# Patient Record
Sex: Female | Born: 1945
Health system: Southern US, Community
[De-identification: ages and names within clinical notes are randomized; demographics above are authoritative.]

## PROBLEM LIST (undated history)

## (undated) DIAGNOSIS — L900 Lichen sclerosus et atrophicus: Secondary | ICD-10-CM

## (undated) DIAGNOSIS — K219 Gastro-esophageal reflux disease without esophagitis: Secondary | ICD-10-CM

## (undated) DIAGNOSIS — T8859XA Other complications of anesthesia, initial encounter: Secondary | ICD-10-CM

## (undated) DIAGNOSIS — G43909 Migraine, unspecified, not intractable, without status migrainosus: Secondary | ICD-10-CM

## (undated) DIAGNOSIS — M797 Fibromyalgia: Secondary | ICD-10-CM

## (undated) DIAGNOSIS — J449 Chronic obstructive pulmonary disease, unspecified: Secondary | ICD-10-CM

## (undated) DIAGNOSIS — G459 Transient cerebral ischemic attack, unspecified: Secondary | ICD-10-CM

## (undated) DIAGNOSIS — M4800 Spinal stenosis, site unspecified: Secondary | ICD-10-CM

## (undated) DIAGNOSIS — F329 Major depressive disorder, single episode, unspecified: Secondary | ICD-10-CM

## (undated) DIAGNOSIS — L738 Other specified follicular disorders: Secondary | ICD-10-CM

## (undated) DIAGNOSIS — R000 Tachycardia, unspecified: Secondary | ICD-10-CM

## (undated) DIAGNOSIS — E785 Hyperlipidemia, unspecified: Secondary | ICD-10-CM

## (undated) DIAGNOSIS — Z860100 Personal history of colon polyps, unspecified: Secondary | ICD-10-CM

## (undated) DIAGNOSIS — J4489 Other specified chronic obstructive pulmonary disease: Secondary | ICD-10-CM

## (undated) DIAGNOSIS — T7840XA Allergy, unspecified, initial encounter: Secondary | ICD-10-CM

## (undated) DIAGNOSIS — I10 Essential (primary) hypertension: Secondary | ICD-10-CM

## (undated) DIAGNOSIS — F32A Depression, unspecified: Secondary | ICD-10-CM

## (undated) HISTORY — DX: Depression, unspecified: F32.A

## (undated) HISTORY — DX: Gastro-esophageal reflux disease without esophagitis: K21.9

## (undated) HISTORY — DX: Personal history of colonic polyps: Z86.010

## (undated) HISTORY — DX: Major depressive disorder, single episode, unspecified: F32.9

## (undated) HISTORY — PX: ABDOMINAL HYSTERECTOMY: SHX81

## (undated) HISTORY — DX: Essential (primary) hypertension: I10

## (undated) HISTORY — DX: Transient cerebral ischemic attack, unspecified: G45.9

## (undated) HISTORY — PX: BREAST CYST ASPIRATION: SHX578

## (undated) HISTORY — DX: Lichen sclerosus et atrophicus: L90.0

## (undated) HISTORY — PX: KNEE ARTHROSCOPY W/ MENISCAL REPAIR: SHX1877

## (undated) HISTORY — DX: Other specified chronic obstructive pulmonary disease: J44.89

## (undated) HISTORY — PX: OTHER SURGICAL HISTORY: SHX169

## (undated) HISTORY — PX: TONSILLECTOMY: SUR1361

## (undated) HISTORY — PX: LIPOMA EXCISION: SHX5283

## (undated) HISTORY — DX: Allergy, unspecified, initial encounter: T78.40XA

## (undated) HISTORY — PX: CARPAL TUNNEL RELEASE: SHX101

## (undated) HISTORY — DX: Other specified follicular disorders: L73.8

## (undated) HISTORY — PX: BREAST SURGERY: SHX581

## (undated) HISTORY — PX: APPENDECTOMY: SHX54

## (undated) HISTORY — DX: Personal history of colon polyps, unspecified: Z86.0100

## (undated) HISTORY — PX: TRIGGER FINGER RELEASE: SHX641

## (undated) HISTORY — DX: Spinal stenosis, site unspecified: M48.00

## (undated) HISTORY — DX: Migraine, unspecified, not intractable, without status migrainosus: G43.909

## (undated) HISTORY — DX: Tachycardia, unspecified: R00.0

## (undated) HISTORY — DX: Fibromyalgia: M79.7

## (undated) HISTORY — DX: Chronic obstructive pulmonary disease, unspecified: J44.9

## (undated) HISTORY — DX: Hyperlipidemia, unspecified: E78.5

---

## 1980-08-09 HISTORY — PX: BREAST EXCISIONAL BIOPSY: SUR124

## 2001-08-17 ENCOUNTER — Encounter: Payer: Self-pay | Admitting: Internal Medicine

## 2001-08-17 LAB — HM COLONOSCOPY

## 2002-12-27 ENCOUNTER — Encounter: Admission: RE | Admit: 2002-12-27 | Discharge: 2002-12-27 | Payer: Self-pay | Admitting: Pulmonary Disease

## 2002-12-27 ENCOUNTER — Encounter: Payer: Self-pay | Admitting: Pulmonary Disease

## 2003-06-24 ENCOUNTER — Other Ambulatory Visit: Payer: Self-pay

## 2004-06-19 ENCOUNTER — Ambulatory Visit: Payer: Self-pay | Admitting: Internal Medicine

## 2004-06-22 ENCOUNTER — Ambulatory Visit: Payer: Self-pay | Admitting: Gastroenterology

## 2004-07-07 ENCOUNTER — Ambulatory Visit: Payer: Self-pay | Admitting: Internal Medicine

## 2004-07-22 ENCOUNTER — Ambulatory Visit: Payer: Self-pay | Admitting: Gastroenterology

## 2004-10-29 ENCOUNTER — Ambulatory Visit: Payer: Self-pay | Admitting: Internal Medicine

## 2005-03-29 ENCOUNTER — Emergency Department (HOSPITAL_COMMUNITY): Admission: EM | Admit: 2005-03-29 | Discharge: 2005-03-29 | Payer: Self-pay | Admitting: Emergency Medicine

## 2005-04-01 ENCOUNTER — Ambulatory Visit: Payer: Self-pay | Admitting: Internal Medicine

## 2005-04-06 ENCOUNTER — Ambulatory Visit: Payer: Self-pay

## 2005-05-03 ENCOUNTER — Ambulatory Visit: Payer: Self-pay | Admitting: Internal Medicine

## 2005-05-25 ENCOUNTER — Encounter: Admission: RE | Admit: 2005-05-25 | Discharge: 2005-05-25 | Payer: Self-pay | Admitting: Internal Medicine

## 2005-05-31 ENCOUNTER — Ambulatory Visit: Payer: Self-pay | Admitting: Internal Medicine

## 2005-07-14 ENCOUNTER — Ambulatory Visit: Payer: Self-pay | Admitting: Family Medicine

## 2005-07-15 ENCOUNTER — Ambulatory Visit: Payer: Self-pay | Admitting: Family Medicine

## 2005-08-30 ENCOUNTER — Ambulatory Visit: Payer: Self-pay | Admitting: Internal Medicine

## 2005-09-29 ENCOUNTER — Ambulatory Visit: Payer: Self-pay | Admitting: Internal Medicine

## 2005-11-03 ENCOUNTER — Encounter: Payer: Self-pay | Admitting: Internal Medicine

## 2005-12-29 ENCOUNTER — Ambulatory Visit: Payer: Self-pay | Admitting: Internal Medicine

## 2006-03-30 ENCOUNTER — Ambulatory Visit: Payer: Self-pay | Admitting: Gastroenterology

## 2006-03-31 ENCOUNTER — Ambulatory Visit: Payer: Self-pay | Admitting: Cardiology

## 2006-04-04 ENCOUNTER — Encounter: Admission: RE | Admit: 2006-04-04 | Discharge: 2006-04-04 | Payer: Self-pay | Admitting: Family Medicine

## 2006-05-02 ENCOUNTER — Ambulatory Visit: Payer: Self-pay | Admitting: Internal Medicine

## 2006-05-17 ENCOUNTER — Ambulatory Visit: Payer: Self-pay | Admitting: Gastroenterology

## 2006-09-06 ENCOUNTER — Ambulatory Visit: Payer: Self-pay | Admitting: Internal Medicine

## 2006-11-18 ENCOUNTER — Encounter: Payer: Self-pay | Admitting: Internal Medicine

## 2006-11-18 ENCOUNTER — Ambulatory Visit: Payer: Self-pay | Admitting: Internal Medicine

## 2006-11-18 DIAGNOSIS — Z860100 Personal history of colon polyps, unspecified: Secondary | ICD-10-CM | POA: Insufficient documentation

## 2006-11-18 DIAGNOSIS — K219 Gastro-esophageal reflux disease without esophagitis: Secondary | ICD-10-CM | POA: Insufficient documentation

## 2006-11-18 DIAGNOSIS — E1169 Type 2 diabetes mellitus with other specified complication: Secondary | ICD-10-CM | POA: Insufficient documentation

## 2006-11-18 DIAGNOSIS — J449 Chronic obstructive pulmonary disease, unspecified: Secondary | ICD-10-CM | POA: Insufficient documentation

## 2006-11-18 DIAGNOSIS — E7800 Pure hypercholesterolemia, unspecified: Secondary | ICD-10-CM

## 2006-11-18 DIAGNOSIS — J309 Allergic rhinitis, unspecified: Secondary | ICD-10-CM | POA: Insufficient documentation

## 2006-11-18 DIAGNOSIS — H409 Unspecified glaucoma: Secondary | ICD-10-CM | POA: Insufficient documentation

## 2006-11-18 LAB — CONVERTED CEMR LAB
Albumin: 4.4 g/dL (ref 3.5–5.2)
BUN: 18 mg/dL (ref 6–23)
CO2: 24 meq/L (ref 19–32)
Calcium: 9.6 mg/dL (ref 8.4–10.5)
Chloride: 99 meq/L (ref 96–112)
Cholesterol: 261 mg/dL — ABNORMAL HIGH (ref 0–200)
Creatinine, Ser: 0.81 mg/dL (ref 0.40–1.20)
Creatinine, Urine: 55.7 mg/dL
Glucose, Bld: 112 mg/dL — ABNORMAL HIGH (ref 70–99)
HDL: 51 mg/dL (ref >39)
Hgb A1c MFr Bld: 7.4 % — ABNORMAL HIGH (ref 4.6–6.1)
LDL Cholesterol: 137 mg/dL — ABNORMAL HIGH (ref 0–99)
Microalb Creat Ratio: 14 mg/g (ref 0.0–30.0)
Microalb, Ur: 0.78 mg/dL (ref 0.00–1.89)
Phosphorus: 3.7 mg/dL (ref 2.3–4.6)
Potassium: 4.1 meq/L (ref 3.5–5.3)
Sodium: 137 meq/L (ref 135–145)
Total CHOL/HDL Ratio: 5.1
Triglycerides: 363 mg/dL — ABNORMAL HIGH (ref <150)
VLDL: 73 mg/dL — ABNORMAL HIGH (ref 0–40)

## 2006-12-29 ENCOUNTER — Telehealth: Payer: Self-pay | Admitting: Internal Medicine

## 2007-01-30 ENCOUNTER — Encounter: Payer: Self-pay | Admitting: Internal Medicine

## 2007-02-03 ENCOUNTER — Encounter: Payer: Self-pay | Admitting: Internal Medicine

## 2007-04-18 ENCOUNTER — Ambulatory Visit: Payer: Self-pay | Admitting: Family Medicine

## 2007-04-18 DIAGNOSIS — M25559 Pain in unspecified hip: Secondary | ICD-10-CM | POA: Insufficient documentation

## 2007-05-01 DIAGNOSIS — E1129 Type 2 diabetes mellitus with other diabetic kidney complication: Secondary | ICD-10-CM | POA: Insufficient documentation

## 2007-05-01 DIAGNOSIS — Z8673 Personal history of transient ischemic attack (TIA), and cerebral infarction without residual deficits: Secondary | ICD-10-CM | POA: Insufficient documentation

## 2007-05-01 DIAGNOSIS — I10 Essential (primary) hypertension: Secondary | ICD-10-CM | POA: Insufficient documentation

## 2007-05-01 DIAGNOSIS — I152 Hypertension secondary to endocrine disorders: Secondary | ICD-10-CM | POA: Insufficient documentation

## 2007-05-01 DIAGNOSIS — M797 Fibromyalgia: Secondary | ICD-10-CM | POA: Insufficient documentation

## 2007-05-30 ENCOUNTER — Ambulatory Visit: Payer: Self-pay | Admitting: Internal Medicine

## 2007-06-20 ENCOUNTER — Encounter: Admission: RE | Admit: 2007-06-20 | Discharge: 2007-06-20 | Payer: Self-pay | Admitting: Internal Medicine

## 2007-06-22 ENCOUNTER — Encounter (INDEPENDENT_AMBULATORY_CARE_PROVIDER_SITE_OTHER): Payer: Self-pay | Admitting: *Deleted

## 2007-06-22 ENCOUNTER — Encounter: Payer: Self-pay | Admitting: Internal Medicine

## 2007-07-24 ENCOUNTER — Telehealth (INDEPENDENT_AMBULATORY_CARE_PROVIDER_SITE_OTHER): Payer: Self-pay | Admitting: *Deleted

## 2007-08-02 ENCOUNTER — Telehealth (INDEPENDENT_AMBULATORY_CARE_PROVIDER_SITE_OTHER): Payer: Self-pay | Admitting: *Deleted

## 2007-09-26 ENCOUNTER — Telehealth (INDEPENDENT_AMBULATORY_CARE_PROVIDER_SITE_OTHER): Payer: Self-pay | Admitting: *Deleted

## 2007-10-10 ENCOUNTER — Telehealth (INDEPENDENT_AMBULATORY_CARE_PROVIDER_SITE_OTHER): Payer: Self-pay | Admitting: *Deleted

## 2007-10-11 ENCOUNTER — Telehealth (INDEPENDENT_AMBULATORY_CARE_PROVIDER_SITE_OTHER): Payer: Self-pay | Admitting: *Deleted

## 2007-10-30 ENCOUNTER — Telehealth (INDEPENDENT_AMBULATORY_CARE_PROVIDER_SITE_OTHER): Payer: Self-pay | Admitting: *Deleted

## 2007-11-06 ENCOUNTER — Ambulatory Visit: Payer: Self-pay | Admitting: Internal Medicine

## 2007-11-06 LAB — CONVERTED CEMR LAB
Bilirubin Urine: NEGATIVE
Blood in Urine, dipstick: NEGATIVE
Glucose, Urine, Semiquant: NEGATIVE
Ketones, urine, test strip: NEGATIVE
Nitrite: NEGATIVE
Protein, U semiquant: NEGATIVE
Specific Gravity, Urine: 1.01
Urobilinogen, UA: 0.2
WBC Urine, dipstick: NEGATIVE
pH: 5

## 2007-11-27 ENCOUNTER — Ambulatory Visit: Payer: Self-pay | Admitting: Internal Medicine

## 2007-12-05 ENCOUNTER — Telehealth (INDEPENDENT_AMBULATORY_CARE_PROVIDER_SITE_OTHER): Payer: Self-pay | Admitting: *Deleted

## 2007-12-27 ENCOUNTER — Encounter: Payer: Self-pay | Admitting: Internal Medicine

## 2008-01-22 ENCOUNTER — Telehealth (INDEPENDENT_AMBULATORY_CARE_PROVIDER_SITE_OTHER): Payer: Self-pay | Admitting: *Deleted

## 2008-01-23 ENCOUNTER — Telehealth (INDEPENDENT_AMBULATORY_CARE_PROVIDER_SITE_OTHER): Payer: Self-pay | Admitting: *Deleted

## 2008-01-25 ENCOUNTER — Telehealth: Payer: Self-pay | Admitting: Internal Medicine

## 2008-05-15 ENCOUNTER — Telehealth: Payer: Self-pay | Admitting: Internal Medicine

## 2008-05-30 ENCOUNTER — Ambulatory Visit: Payer: Self-pay | Admitting: Internal Medicine

## 2008-05-31 ENCOUNTER — Encounter: Payer: Self-pay | Admitting: Internal Medicine

## 2008-06-02 LAB — CONVERTED CEMR LAB: Anti Nuclear Antibody(ANA): NEGATIVE

## 2008-06-03 LAB — CONVERTED CEMR LAB
ALT: 29 units/L (ref 0–35)
AST: 22 units/L (ref 0–37)
Albumin: 4.2 g/dL (ref 3.5–5.2)
Alkaline Phosphatase: 53 units/L (ref 39–117)
BUN: 17 mg/dL (ref 6–23)
Basophils Absolute: 0 10*3/uL (ref 0.0–0.1)
Basophils Relative: 0.4 % (ref 0.0–3.0)
Bilirubin, Direct: 0.1 mg/dL (ref 0.0–0.3)
CO2: 30 meq/L (ref 19–32)
Calcium: 9.8 mg/dL (ref 8.4–10.5)
Chloride: 99 meq/L (ref 96–112)
Cholesterol: 255 mg/dL (ref 0–200)
Creatinine, Ser: 0.8 mg/dL (ref 0.4–1.2)
Creatinine,U: 98.6 mg/dL
Direct LDL: 143.2 mg/dL
Eosinophils Absolute: 0.6 10*3/uL (ref 0.0–0.7)
Eosinophils Relative: 5.6 % — ABNORMAL HIGH (ref 0.0–5.0)
GFR calc Af Amer: 93 mL/min
GFR calc non Af Amer: 77 mL/min
Glucose, Bld: 117 mg/dL — ABNORMAL HIGH (ref 70–99)
HCT: 39.5 % (ref 36.0–46.0)
HDL: 54 mg/dL (ref >39.0)
Hemoglobin: 13.3 g/dL (ref 12.0–15.0)
Hgb A1c MFr Bld: 6.7 % — ABNORMAL HIGH (ref 4.6–6.0)
Lymphocytes Relative: 31.1 % (ref 12.0–46.0)
MCHC: 33.7 g/dL (ref 30.0–36.0)
MCV: 89.3 fL (ref 78.0–100.0)
Microalb Creat Ratio: 11.2 mg/g (ref 0.0–30.0)
Microalb, Ur: 1.1 mg/dL (ref 0.0–1.9)
Monocytes Absolute: 1 10*3/uL (ref 0.1–1.0)
Monocytes Relative: 9.4 % (ref 3.0–12.0)
Neutro Abs: 5.4 10*3/uL (ref 1.4–7.7)
Neutrophils Relative %: 53.5 % (ref 43.0–77.0)
Phosphorus: 4.4 mg/dL (ref 2.3–4.6)
Platelets: 366 10*3/uL (ref 150–400)
Potassium: 3.8 meq/L (ref 3.5–5.1)
RBC: 4.43 M/uL (ref 3.87–5.11)
RDW: 12.6 % (ref 11.5–14.6)
Sodium: 139 meq/L (ref 135–145)
TSH: 2.58 microintl units/mL (ref 0.35–5.50)
Total Bilirubin: 0.6 mg/dL (ref 0.3–1.2)
Total CHOL/HDL Ratio: 4.7
Total Protein: 7.7 g/dL (ref 6.0–8.3)
Triglycerides: 295 mg/dL (ref 0–149)
VLDL: 59 mg/dL — ABNORMAL HIGH (ref 0–40)
WBC: 10.2 10*3/uL (ref 4.5–10.5)

## 2008-07-02 ENCOUNTER — Telehealth: Payer: Self-pay | Admitting: Internal Medicine

## 2008-07-02 ENCOUNTER — Telehealth (INDEPENDENT_AMBULATORY_CARE_PROVIDER_SITE_OTHER): Payer: Self-pay | Admitting: *Deleted

## 2008-07-05 ENCOUNTER — Encounter: Admission: RE | Admit: 2008-07-05 | Discharge: 2008-07-05 | Payer: Self-pay | Admitting: Internal Medicine

## 2008-07-08 ENCOUNTER — Encounter (INDEPENDENT_AMBULATORY_CARE_PROVIDER_SITE_OTHER): Payer: Self-pay | Admitting: *Deleted

## 2008-07-12 ENCOUNTER — Ambulatory Visit: Payer: Self-pay | Admitting: Family Medicine

## 2008-07-12 LAB — CONVERTED CEMR LAB
Bilirubin Urine: NEGATIVE
Blood in Urine, dipstick: NEGATIVE
Glucose, Urine, Semiquant: NEGATIVE
Ketones, urine, test strip: NEGATIVE
Nitrite: NEGATIVE
Protein, U semiquant: NEGATIVE
Specific Gravity, Urine: <1.005
Urobilinogen, UA: 0.2
WBC Urine, dipstick: NEGATIVE
pH: 7.5

## 2008-08-29 ENCOUNTER — Telehealth: Payer: Self-pay | Admitting: Internal Medicine

## 2008-11-07 ENCOUNTER — Telehealth: Payer: Self-pay | Admitting: Internal Medicine

## 2008-11-25 ENCOUNTER — Ambulatory Visit: Payer: Self-pay | Admitting: Internal Medicine

## 2008-11-28 LAB — CONVERTED CEMR LAB
Albumin: 4 g/dL (ref 3.5–5.2)
BUN: 12 mg/dL (ref 6–23)
Basophils Absolute: 0.1 10*3/uL (ref 0.0–0.1)
Basophils Relative: 0.9 % (ref 0.0–3.0)
CO2: 30 meq/L (ref 19–32)
Calcium: 9.7 mg/dL (ref 8.4–10.5)
Chloride: 102 meq/L (ref 96–112)
Creatinine, Ser: 0.8 mg/dL (ref 0.4–1.2)
Eosinophils Absolute: 0.5 10*3/uL (ref 0.0–0.7)
Eosinophils Relative: 5.1 % — ABNORMAL HIGH (ref 0.0–5.0)
Glucose, Bld: 109 mg/dL — ABNORMAL HIGH (ref 70–99)
HCT: 36.1 % (ref 36.0–46.0)
Hemoglobin: 12.5 g/dL (ref 12.0–15.0)
Hgb A1c MFr Bld: 7.2 % — ABNORMAL HIGH (ref 4.6–6.5)
Lymphocytes Relative: 40.1 % (ref 12.0–46.0)
Lymphs Abs: 4.1 10*3/uL — ABNORMAL HIGH (ref 0.7–4.0)
MCHC: 34.8 g/dL (ref 30.0–36.0)
MCV: 86.3 fL (ref 78.0–100.0)
Monocytes Absolute: 0.8 10*3/uL (ref 0.1–1.0)
Monocytes Relative: 7.7 % (ref 3.0–12.0)
Neutro Abs: 4.8 10*3/uL (ref 1.4–7.7)
Neutrophils Relative %: 46.2 % (ref 43.0–77.0)
Phosphorus: 4.3 mg/dL (ref 2.3–4.6)
Platelets: 358 10*3/uL (ref 150.0–400.0)
Potassium: 4.6 meq/L (ref 3.5–5.1)
RBC: 4.18 M/uL (ref 3.87–5.11)
RDW: 12.5 % (ref 11.5–14.6)
Sodium: 139 meq/L (ref 135–145)
TSH: 3.59 microintl units/mL (ref 0.35–5.50)
WBC: 10.3 10*3/uL (ref 4.5–10.5)

## 2008-12-06 ENCOUNTER — Telehealth: Payer: Self-pay | Admitting: Internal Medicine

## 2009-02-04 ENCOUNTER — Telehealth (INDEPENDENT_AMBULATORY_CARE_PROVIDER_SITE_OTHER): Payer: Self-pay | Admitting: *Deleted

## 2009-02-18 ENCOUNTER — Ambulatory Visit: Payer: Self-pay | Admitting: Family Medicine

## 2009-02-18 DIAGNOSIS — G4700 Insomnia, unspecified: Secondary | ICD-10-CM | POA: Insufficient documentation

## 2009-02-18 DIAGNOSIS — M19049 Primary osteoarthritis, unspecified hand: Secondary | ICD-10-CM | POA: Insufficient documentation

## 2009-02-21 ENCOUNTER — Telehealth: Payer: Self-pay | Admitting: Family Medicine

## 2009-02-26 ENCOUNTER — Telehealth: Payer: Self-pay | Admitting: Family Medicine

## 2009-02-28 ENCOUNTER — Ambulatory Visit: Payer: Self-pay | Admitting: Family Medicine

## 2009-03-07 ENCOUNTER — Ambulatory Visit: Payer: Self-pay | Admitting: Family Medicine

## 2009-03-10 ENCOUNTER — Encounter: Payer: Self-pay | Admitting: Family Medicine

## 2009-03-11 LAB — CONVERTED CEMR LAB
ALT: 36 units/L — ABNORMAL HIGH (ref 0–35)
AST: 30 units/L (ref 0–37)
Albumin: 4.2 g/dL (ref 3.5–5.2)
Alkaline Phosphatase: 54 units/L (ref 39–117)
BUN: 19 mg/dL (ref 6–23)
CO2: 25 meq/L (ref 19–32)
Calcium: 9.4 mg/dL (ref 8.4–10.5)
Chloride: 100 meq/L (ref 96–112)
Cholesterol: 230 mg/dL — ABNORMAL HIGH (ref 0–200)
Creatinine, Ser: 0.9 mg/dL (ref 0.40–1.20)
Glucose, Bld: 117 mg/dL — ABNORMAL HIGH (ref 70–99)
HDL: 50 mg/dL (ref >39)
Hgb A1c MFr Bld: 6.7 % — ABNORMAL HIGH (ref 4.6–6.5)
LDL Cholesterol: 139 mg/dL — ABNORMAL HIGH (ref 0–99)
Potassium: 4.6 meq/L (ref 3.5–5.3)
Sodium: 138 meq/L (ref 135–145)
Total Bilirubin: 0.3 mg/dL (ref 0.3–1.2)
Total CHOL/HDL Ratio: 4.6
Total Protein: 7.2 g/dL (ref 6.0–8.3)
Triglycerides: 203 mg/dL — ABNORMAL HIGH (ref <150)
VLDL: 41 mg/dL — ABNORMAL HIGH (ref 0–40)

## 2009-03-12 ENCOUNTER — Telehealth: Payer: Self-pay | Admitting: Family Medicine

## 2009-05-02 ENCOUNTER — Telehealth (INDEPENDENT_AMBULATORY_CARE_PROVIDER_SITE_OTHER): Payer: Self-pay | Admitting: *Deleted

## 2009-05-28 ENCOUNTER — Ambulatory Visit: Payer: Self-pay | Admitting: Family Medicine

## 2009-05-29 LAB — CONVERTED CEMR LAB
Creatinine, Urine: 75.7 mg/dL
Hgb A1c MFr Bld: 6.6 % — ABNORMAL HIGH (ref 4.6–6.1)
Microalb Creat Ratio: 6.6 mg/g (ref 0.0–30.0)
Microalb, Ur: 0.5 mg/dL (ref 0.00–1.89)

## 2009-06-06 ENCOUNTER — Ambulatory Visit: Payer: Self-pay | Admitting: Family Medicine

## 2009-06-06 DIAGNOSIS — R1012 Left upper quadrant pain: Secondary | ICD-10-CM

## 2009-06-06 DIAGNOSIS — M25519 Pain in unspecified shoulder: Secondary | ICD-10-CM | POA: Insufficient documentation

## 2009-06-06 LAB — CONVERTED CEMR LAB

## 2009-06-09 ENCOUNTER — Encounter: Payer: Self-pay | Admitting: Internal Medicine

## 2009-06-09 ENCOUNTER — Ambulatory Visit: Payer: Self-pay | Admitting: Family Medicine

## 2009-06-09 ENCOUNTER — Telehealth: Payer: Self-pay | Admitting: Family Medicine

## 2009-06-10 LAB — CONVERTED CEMR LAB
ALT: 13 units/L (ref 0–35)
AST: 14 units/L (ref 0–37)
Albumin: 4.4 g/dL (ref 3.5–5.2)
Alkaline Phosphatase: 52 units/L (ref 39–117)
BUN: 24 mg/dL — ABNORMAL HIGH (ref 6–23)
Bilirubin, Direct: <0.1 mg/dL (ref 0.0–0.3)
CO2: 22 meq/L (ref 19–32)
Calcium: 9.9 mg/dL (ref 8.4–10.5)
Chloride: 102 meq/L (ref 96–112)
Cholesterol: 198 mg/dL (ref 0–200)
Creatinine, Ser: 0.97 mg/dL (ref 0.40–1.20)
Glucose, Bld: 150 mg/dL — ABNORMAL HIGH (ref 70–99)
HDL: 48 mg/dL (ref >39)
LDL Cholesterol: 88 mg/dL (ref 0–99)
Potassium: 4.9 meq/L (ref 3.5–5.3)
Sodium: 138 meq/L (ref 135–145)
Total Bilirubin: 0.2 mg/dL — ABNORMAL LOW (ref 0.3–1.2)
Total CHOL/HDL Ratio: 4.1
Total Protein: 7.2 g/dL (ref 6.0–8.3)
Triglycerides: 308 mg/dL — ABNORMAL HIGH (ref <150)
VLDL: 62 mg/dL — ABNORMAL HIGH (ref 0–40)

## 2009-07-07 ENCOUNTER — Encounter: Admission: RE | Admit: 2009-07-07 | Discharge: 2009-07-07 | Payer: Self-pay | Admitting: Family Medicine

## 2009-07-08 ENCOUNTER — Encounter (INDEPENDENT_AMBULATORY_CARE_PROVIDER_SITE_OTHER): Payer: Self-pay | Admitting: *Deleted

## 2009-07-28 ENCOUNTER — Telehealth: Payer: Self-pay | Admitting: Family Medicine

## 2009-08-29 ENCOUNTER — Telehealth: Payer: Self-pay | Admitting: Family Medicine

## 2009-09-30 ENCOUNTER — Telehealth: Payer: Self-pay | Admitting: Family Medicine

## 2009-10-10 ENCOUNTER — Telehealth: Payer: Self-pay | Admitting: Family Medicine

## 2009-12-02 ENCOUNTER — Telehealth: Payer: Self-pay | Admitting: Family Medicine

## 2009-12-24 ENCOUNTER — Ambulatory Visit: Payer: Self-pay | Admitting: Family Medicine

## 2009-12-24 DIAGNOSIS — J019 Acute sinusitis, unspecified: Secondary | ICD-10-CM | POA: Insufficient documentation

## 2009-12-29 ENCOUNTER — Telehealth: Payer: Self-pay | Admitting: Family Medicine

## 2010-01-07 ENCOUNTER — Telehealth: Payer: Self-pay | Admitting: Family Medicine

## 2010-01-21 ENCOUNTER — Ambulatory Visit: Payer: Self-pay | Admitting: Family Medicine

## 2010-01-21 DIAGNOSIS — R002 Palpitations: Secondary | ICD-10-CM

## 2010-01-27 ENCOUNTER — Ambulatory Visit: Payer: Self-pay | Admitting: Cardiovascular Disease

## 2010-01-29 ENCOUNTER — Telehealth (INDEPENDENT_AMBULATORY_CARE_PROVIDER_SITE_OTHER): Payer: Self-pay

## 2010-02-02 ENCOUNTER — Telehealth: Payer: Self-pay | Admitting: Cardiovascular Disease

## 2010-02-03 ENCOUNTER — Encounter: Payer: Self-pay | Admitting: Cardiovascular Disease

## 2010-02-17 ENCOUNTER — Ambulatory Visit: Payer: Self-pay

## 2010-02-17 ENCOUNTER — Encounter: Payer: Self-pay | Admitting: Cardiovascular Disease

## 2010-03-09 LAB — HM PAP SMEAR

## 2010-03-09 LAB — CONVERTED CEMR LAB
Pap Smear: NORMAL
Pap Smear: NORMAL

## 2010-03-09 LAB — HM DIABETES EYE EXAM

## 2010-03-10 ENCOUNTER — Telehealth: Payer: Self-pay | Admitting: Family Medicine

## 2010-03-12 ENCOUNTER — Encounter: Payer: Self-pay | Admitting: Family Medicine

## 2010-04-06 ENCOUNTER — Telehealth: Payer: Self-pay | Admitting: Family Medicine

## 2010-06-17 ENCOUNTER — Ambulatory Visit: Payer: Self-pay | Admitting: Family Medicine

## 2010-07-17 ENCOUNTER — Encounter: Payer: Self-pay | Admitting: Family Medicine

## 2010-07-17 ENCOUNTER — Ambulatory Visit: Payer: Self-pay | Admitting: Family Medicine

## 2010-07-17 DIAGNOSIS — R10814 Left lower quadrant abdominal tenderness: Secondary | ICD-10-CM

## 2010-07-17 LAB — CONVERTED CEMR LAB
Cholesterol, target level: 200 mg/dL
HDL goal, serum: 40 mg/dL
LDL Goal: 70 mg/dL

## 2010-07-19 LAB — CONVERTED CEMR LAB
AST: 19 units/L (ref 0–37)
BUN: 16 mg/dL (ref 6–23)
Calcium: 10.1 mg/dL (ref 8.4–10.5)
Chloride: 98 meq/L (ref 96–112)
Cholesterol: 265 mg/dL — ABNORMAL HIGH (ref 0–200)
Creatinine, Ser: 0.92 mg/dL (ref 0.40–1.20)
Creatinine, Urine: 218.7 mg/dL
HDL: 55 mg/dL (ref >39)
Hgb A1c MFr Bld: 6.4 % — ABNORMAL HIGH (ref <5.7)
Microalb, Ur: 2.08 mg/dL — ABNORMAL HIGH (ref 0.00–1.89)
Total CHOL/HDL Ratio: 4.8
Triglycerides: 432 mg/dL — ABNORMAL HIGH (ref <150)

## 2010-07-23 ENCOUNTER — Encounter
Admission: RE | Admit: 2010-07-23 | Discharge: 2010-07-23 | Payer: Self-pay | Source: Home / Self Care | Attending: Family Medicine | Admitting: Family Medicine

## 2010-08-18 ENCOUNTER — Encounter
Admission: RE | Admit: 2010-08-18 | Discharge: 2010-08-18 | Payer: Self-pay | Source: Home / Self Care | Attending: Family Medicine | Admitting: Family Medicine

## 2010-08-19 LAB — HM MAMMOGRAPHY: HM Mammogram: NORMAL

## 2010-08-30 ENCOUNTER — Encounter: Payer: Self-pay | Admitting: Internal Medicine

## 2010-09-06 LAB — CONVERTED CEMR LAB
ALT: 17 units/L (ref 0–35)
ALT: 24 units/L (ref 0–35)
ALT: 34 units/L (ref 0–35)
AST: 18 units/L (ref 0–37)
AST: 18 units/L (ref 0–37)
Albumin: 4.1 g/dL (ref 3.5–5.2)
Albumin: 4.4 g/dL (ref 3.5–5.2)
Albumin: 4.6 g/dL (ref 3.5–5.2)
Albumin: 4.7 g/dL (ref 3.5–5.2)
Alkaline Phosphatase: 55 units/L (ref 39–117)
Alkaline Phosphatase: 61 units/L (ref 39–117)
BUN: 12 mg/dL (ref 6–23)
BUN: 15 mg/dL (ref 6–23)
BUN: 15 mg/dL (ref 6–23)
Basophils Absolute: 0.1 10*3/uL (ref 0.0–0.1)
Basophils Absolute: 0.1 10*3/uL (ref 0.0–0.1)
Basophils Absolute: 0.1 10*3/uL (ref 0.0–0.1)
Basophils Relative: 1 % (ref 0–1)
Basophils Relative: 1 % (ref 0–1)
Basophils Relative: 1.2 % — ABNORMAL HIGH (ref 0.0–1.0)
Bilirubin, Direct: 0.1 mg/dL (ref 0.0–0.3)
CO2: 23 meq/L (ref 19–32)
CO2: 23 meq/L (ref 19–32)
CO2: 30 meq/L (ref 19–32)
Calcium: 9.9 mg/dL (ref 8.4–10.5)
Calcium: 9.9 mg/dL (ref 8.4–10.5)
Calcium: 9.9 mg/dL (ref 8.4–10.5)
Chloride: 97 meq/L (ref 96–112)
Chloride: 98 meq/L (ref 96–112)
Chloride: 99 meq/L (ref 96–112)
Cholesterol: 258 mg/dL (ref 0–200)
Creatinine, Ser: 0.7 mg/dL (ref 0.4–1.2)
Creatinine, Ser: 0.8 mg/dL (ref 0.40–1.20)
Creatinine, Ser: 0.81 mg/dL (ref 0.40–1.20)
Creatinine,U: 38.8 mg/dL
Direct LDL: 158.3 mg/dL
Eosinophils Absolute: 0.5 10*3/uL (ref 0.0–0.7)
Eosinophils Absolute: 0.7 10*3/uL (ref 0.0–0.7)
Eosinophils Absolute: 0.8 10*3/uL — ABNORMAL HIGH (ref 0.0–0.6)
Eosinophils Relative: 5 % (ref 0–5)
Eosinophils Relative: 7 % — ABNORMAL HIGH (ref 0–5)
Eosinophils Relative: 7.9 % — ABNORMAL HIGH (ref 0.0–5.0)
GFR calc Af Amer: 109 mL/min
GFR calc non Af Amer: 90 mL/min
Glucose, Bld: 108 mg/dL — ABNORMAL HIGH (ref 70–99)
Glucose, Bld: 115 mg/dL — ABNORMAL HIGH (ref 70–99)
Glucose, Bld: 94 mg/dL (ref 70–99)
HCT: 35.9 % — ABNORMAL LOW (ref 36.0–46.0)
HCT: 36.8 % (ref 36.0–46.0)
HCT: 38.1 % (ref 36.0–46.0)
HDL: 55.8 mg/dL (ref >39.0)
Hemoglobin: 12.5 g/dL (ref 12.0–15.0)
Hemoglobin: 12.7 g/dL (ref 12.0–15.0)
Hemoglobin: 12.9 g/dL (ref 12.0–15.0)
Hgb A1c MFr Bld: 7.2 % — ABNORMAL HIGH (ref 4.6–6.0)
Hgb A1c MFr Bld: 7.3 % — ABNORMAL HIGH (ref 4.6–6.1)
Indirect Bilirubin: 0.2 mg/dL (ref 0.0–0.9)
Lymphocytes Relative: 34.9 % (ref 12.0–46.0)
Lymphocytes Relative: 37 % (ref 12–46)
Lymphocytes Relative: 37 % (ref 12–46)
Lymphs Abs: 3.6 10*3/uL (ref 0.7–4.0)
Lymphs Abs: 4 10*3/uL (ref 0.7–4.0)
MCHC: 33.9 g/dL (ref 30.0–36.0)
MCHC: 34.6 g/dL (ref 30.0–36.0)
MCHC: 34.8 g/dL (ref 30.0–36.0)
MCV: 83.5 fL (ref 78.0–100.0)
MCV: 85.8 fL (ref 78.0–100.0)
MCV: 86.6 fL (ref 78.0–100.0)
Microalb Creat Ratio: 18 mg/g (ref 0.0–30.0)
Microalb, Ur: 0.7 mg/dL (ref 0.0–1.9)
Monocytes Absolute: 0.6 10*3/uL (ref 0.1–1.0)
Monocytes Absolute: 0.6 10*3/uL (ref 0.2–0.7)
Monocytes Absolute: 0.8 10*3/uL (ref 0.1–1.0)
Monocytes Relative: 5 % (ref 3–12)
Monocytes Relative: 6.2 % (ref 3.0–11.0)
Monocytes Relative: 8 % (ref 3–12)
Neutro Abs: 4.8 10*3/uL (ref 1.4–7.7)
Neutro Abs: 4.9 10*3/uL (ref 1.7–7.7)
Neutro Abs: 5.6 10*3/uL (ref 1.7–7.7)
Neutrophils Relative %: 49.8 % (ref 43.0–77.0)
Neutrophils Relative %: 50 % (ref 43–77)
Neutrophils Relative %: 51 % (ref 43–77)
Phosphorus: 3.4 mg/dL (ref 2.3–4.6)
Phosphorus: 4 mg/dL (ref 2.3–4.6)
Platelets: 405 10*3/uL — ABNORMAL HIGH (ref 150–400)
Platelets: 414 10*3/uL — ABNORMAL HIGH (ref 150–400)
Platelets: 416 10*3/uL — ABNORMAL HIGH (ref 150–400)
Potassium: 3.6 meq/L (ref 3.5–5.3)
Potassium: 4.1 meq/L (ref 3.5–5.1)
Potassium: 4.7 meq/L (ref 3.5–5.3)
RBC: 4.25 M/uL (ref 3.87–5.11)
RBC: 4.3 M/uL (ref 3.87–5.11)
RBC: 4.44 M/uL (ref 3.87–5.11)
RDW: 12.9 % (ref 11.5–14.6)
RDW: 13.4 % (ref 11.5–15.5)
RDW: 13.7 % (ref 11.5–15.5)
Sodium: 135 meq/L (ref 135–145)
Sodium: 137 meq/L (ref 135–145)
Sodium: 138 meq/L (ref 135–145)
TSH: 2.281 microintl units/mL (ref 0.350–5.50)
TSH: 2.43 microintl units/mL (ref 0.350–4.500)
TSH: 2.88 microintl units/mL (ref 0.35–5.50)
Total Bilirubin: 0.3 mg/dL (ref 0.3–1.2)
Total Bilirubin: 0.3 mg/dL (ref 0.3–1.2)
Total CHOL/HDL Ratio: 4.6
Total Protein: 7.4 g/dL (ref 6.0–8.3)
Total Protein: 7.9 g/dL (ref 6.0–8.3)
Triglycerides: 344 mg/dL (ref 0–149)
VLDL: 69 mg/dL — ABNORMAL HIGH (ref 0–40)
WBC: 10.9 10*3/uL — ABNORMAL HIGH (ref 4.0–10.5)
WBC: 9.7 10*3/uL (ref 4.5–10.5)
WBC: 9.9 10*3/uL (ref 4.0–10.5)

## 2010-09-08 NOTE — Progress Notes (Signed)
Summary: Rx Tramadol  Phone Note Refill Request Call back at [phone] Message from:  Scriptline on September 30, 2009 3:14 PM  Refills Requested: Medication #1:  TRAMADOL HCL 50 MG  TABS 1 three times a day as needed for severe pain   Last Refilled: 07/28/2009 Received refill request from scriptline, please advise   Method Requested: Telephone to Pharmacy Initial call taken by: Linde Gillis CMA Duncan Dull),  September 30, 2009 3:14 PM  Follow-up for Phone Call        Rx called to pharmacy Follow-up by: Linde Gillis CMA Duncan Dull),  October 01, 2009 5:28 PM    Prescriptions: TRAMADOL HCL 50 MG  TABS (TRAMADOL HCL) 1 three times a day as needed for severe pain  #90 Each x 0   Entered and Authorized by:   Kerby Nora MD   Signed by:   Kerby Nora MD on 10/01/2009   Method used:   Telephoned to ...       [facility] (retail)       [address]       [place], [place]  [address]       Ph: [phone]       Fax: [phone]   RxID:   [id number]

## 2010-09-08 NOTE — Progress Notes (Signed)
Summary: albuterol   Phone Note Refill Request Call back at Home Phone [phone] Message from:  Patient on April 06, 2010 11:41 AM  Refills Requested: Medication #1:  ALBUTEROL 90 MCG/ACT  AERS 2 puffs two times a day as needed  Medication #2:  TRAMADOL HCL 50 MG  TABS 1 three times a day as needed for severe pain Patient states that her inhaler has expired and that she has needed alot more often lately. She would like new rx [facility] to [facility] [address]   Initial call taken by: Melody Comas,  April 06, 2010 11:42 AM  Follow-up for Phone Call        If breasthing worsened..she needs an appt to be seen. Refill until appt as below. Follow-up by: Kerby Nora MD,  April 06, 2010 3:54 PM  Additional Follow-up for Phone Call Additional follow up Details #1::        Patient advised.Consuello Masse CMA   Additional Follow-up by: Benny Lennert CMA Duncan Dull),  April 06, 2010 4:01 PM    Prescriptions: ALBUTEROL 90 MCG/ACT  AERS (ALBUTEROL) 2 puffs two times a day as needed  #1 x 0   Entered by:   Benny Lennert CMA (AAMA)   Authorized by:   Kerby Nora MD   Signed by:   Benny Lennert CMA (AAMA) on 04/06/2010   Method used:   Electronically to        [facility] (retail)       [address]       [place], [place]  [address]       Ph: [phone]       Fax: [phone]   RxID:   [id number] TRAMADOL HCL 50 MG  TABS (TRAMADOL HCL) 1 three times a day as needed for severe pain  #90 Each x 0   Entered by:   Benny Lennert CMA (AAMA)   Authorized by:   Kerby Nora MD   Signed by:   Benny Lennert CMA (AAMA) on 04/06/2010   Method used:   Electronically to        [facility] (retail)       [address]       [place], [place]  [address]       Ph: [phone]       Fax: [phone]   RxID:   [id number] TRAMADOL HCL 50 MG  TABS (TRAMADOL HCL) 1 three times a day as needed for severe pain  #90  Each x 0   Entered and Authorized by:   Kerby Nora MD   Signed by:   Kerby Nora MD on 04/06/2010   Method used:   Telephoned to ...       [facility] (retail)       [address]       [place], [place]  [address]       Ph: [phone]       Fax: [phone]   RxID:   [id number] ALBUTEROL 90 MCG/ACT  AERS (ALBUTEROL) 2 puffs two times a day as needed  #0 x 0   Entered and Authorized by:   Kerby Nora MD   Signed by:   Kerby Nora MD on 04/06/2010   Method used:   Telephoned to .Marland KitchenMarland Kitchen  [facility] (retail)       [address]       [place], [place]  [address]       Ph: [phone]       Fax: [phone]   RxID:   [id number]

## 2010-09-08 NOTE — Assessment & Plan Note (Signed)
Summary: COUGH AND SORE THROAT   Vital Signs:  Patient profile:   65 year old female Height:      62 inches Weight:      184.25 pounds BMI:     33.82 Temp:     98.9 degrees F oral Pulse rate:   68 / minute Pulse rhythm:   regular BP sitting:   110 / 70  (left arm) Cuff size:   large  Vitals Entered By: Linde Gillis CMA Duncan Dull) (Dec 24, 2009 12:13 PM) CC: sore throat, congestion, diarrhea   History of Present Illness: 65 yo with 10 days of URI symptoms.  Started out with cough, runny nose. Now has terrible sinus pressure, feels achy all over over. Does not think she has a fever. Coughing up clear phlegm. No SOB or wheezing.  Current Medications (verified): 1)  Metoprolol Tartrate 50 Mg Tabs (Metoprolol Tartrate) .Marland Kitchen.. 1 Tab Po  Two Times A Day 2)  Omeprazole 20 Mg Tbec (Omeprazole) .... Take 2 By Mouth Daily 3)  Lisinopril-Hydrochlorothiazide 10-12.5 Mg Tabs (Lisinopril-Hydrochlorothiazide) .Marland Kitchen.. 1 Tab By Mouth Daily 4)  Metformin Hcl 500 Mg  Tb24 (Metformin Hcl) .... Take 1 Tablet By Mouth Two Times A Day 5)  Xalatan 0.005 %  Soln (Latanoprost) .... As Directed 6)  Timolol Maleate 0.5 %  Solg (Timolol Maleate) .... As Directed 7)  Aspirin 81 Mg  Tbec (Aspirin) .... Take One By Mouth Once A Day 8)  Albuterol 90 Mcg/act  Aers (Albuterol) .... 2 Puffs Two Times A Day As Needed 9)  Tramadol Hcl 50 Mg  Tabs (Tramadol Hcl) .Marland Kitchen.. 1 Three Times A Day As Needed For Severe Pain 10)  Tylenol Pm Extra Strength 500-25 Mg  Tabs (Diphenhydramine-Apap (Sleep)) .... Take 1 Tablet By Mouth At Bedtime 11)  Onetouch Ultra Test  Strp (Glucose Blood) .... Use One Daily As Needed 12)  Clobetasol Propionate 0.05 % Foam (Clobetasol Propionate) .... Use Once or Twice Daily 13)  Desonide 0.05 % Lotn (Desonide) .... Apply To Affected Areas As Needed 14)  [facility]vs Cleansing Skin  Crea (Soap & Cleansers) .... Use As Needed 15)  Kerasal 5-10 % Oint (Salicylic Acid-Urea) .... Use As Needed 16)  Gnp Potassium 99  Mg Tabs (Potassium) .... Take 1 By Mouth Once Daily 17)  Mobic 7.5 Mg Tabs (Meloxicam) .... Take 1 Tablet By Mouth Once A Day 18)  Voltaren 1 % Gel (Diclofenac Sodium) .... Apply 2 Grams Two Times A Day 19)  Azithromycin 250 Mg  Tabs (Azithromycin) .... 2 By  Mouth Today and Then 1 Daily For 4 Days  Allergies: 1)  ! Codeine 2)  ! Sulfa 3)  ! Augmentin 4)  ! Vicodin 5)  ! * Cosopt 6)  ! * Lovastatin 7)  ! * Deet  Review of Systems      See HPI General:  Complains of malaise; denies fever. ENT:  Complains of nasal congestion and sinus pressure; denies earache and sore throat. Resp:  Complains of cough; denies shortness of breath and wheezing.  Physical Exam  General:  overweight appearing female in NAD Nose:  boggy turbinates. frontal sinuses TTP Mouth:  MMM Lungs:  Normal respiratory effort, chest expands symmetrically. Lungs are clear to auscultation, no crackles or wheezes. Heart:  Normal rate and regular rhythm. S1 and S2 normal without gallop, murmur, click, rub or other extra sounds. Extremities:  no edema  Psych:  normally interactive, good eye contact, not anxious appearing, and not depressed appearing.  Impression & Recommendations:  Problem # 1:  SINUSITIS, ACUTE (ICD-461.9) Assessment New Given duration and progression of symptoms, will treat for bacterial sinusitis with Zpack. conitinue supportive care as per pt instructions. Her updated medication list for this problem includes:    Azithromycin 250 Mg Tabs (Azithromycin) .Marland Kitchen... 2 by  mouth today and then 1 daily for 4 days  Complete Medication List: 1)  Metoprolol Tartrate 50 Mg Tabs (Metoprolol tartrate) .Marland Kitchen.. 1 tab po  two times a day 2)  Omeprazole 20 Mg Tbec (Omeprazole) .... Take 2 by mouth daily 3)  Lisinopril-hydrochlorothiazide 10-12.5 Mg Tabs (Lisinopril-hydrochlorothiazide) .Marland Kitchen.. 1 tab by mouth daily 4)  Metformin Hcl 500 Mg Tb24 (Metformin hcl) .... Take 1 tablet by mouth two times a day 5)  Xalatan  0.005 % Soln (Latanoprost) .... As directed 6)  Timolol Maleate 0.5 % Solg (Timolol maleate) .... As directed 7)  Aspirin 81 Mg Tbec (Aspirin) .... Take one by mouth once a day 8)  Albuterol 90 Mcg/act Aers (Albuterol) .... 2 puffs two times a day as needed 9)  Tramadol Hcl 50 Mg Tabs (Tramadol hcl) .Marland Kitchen.. 1 three times a day as needed for severe pain 10)  Tylenol Pm Extra Strength 500-25 Mg Tabs (Diphenhydramine-apap (sleep)) .... Take 1 tablet by mouth at bedtime 11)  Onetouch Ultra Test Strp (Glucose blood) .... Use one daily as needed 12)  Clobetasol Propionate 0.05 % Foam (Clobetasol propionate) .... Use once or twice daily 13)  Desonide 0.05 % Lotn (Desonide) .... Apply to affected areas as needed 14)  [facility]vs Cleansing Skin Crea (Soap & cleansers) .... Use as needed 15)  Kerasal 5-10 % Oint (Salicylic acid-urea) .... Use as needed 16)  Gnp Potassium 99 Mg Tabs (Potassium) .... Take 1 by mouth once daily 17)  Mobic 7.5 Mg Tabs (Meloxicam) .... Take 1 tablet by mouth once a day 18)  Voltaren 1 % Gel (Diclofenac sodium) .... Apply 2 grams two times a day 19)  Azithromycin 250 Mg Tabs (Azithromycin) .... 2 by  mouth today and then 1 daily for 4 days  Patient Instructions: 1)  Take antibiotic as directed.  Drink lots of fluids.  Treat sympotmatically with Mucinex, nasal saline irrigation, and Tylenol/Ibuprofen. Also try claritin D or zyrtec D over the counter- two times a day as needed ( have to sign for them at pharmacy). You can use warm compresses.  Cough suppressant at night. Call if not improving as expected in 5-7 days.  Prescriptions: AZITHROMYCIN 250 MG  TABS (AZITHROMYCIN) 2 by  mouth today and then 1 daily for 4 days  #6 x 0   Entered and Authorized by:   Ruthe Mannan MD   Signed by:   Ruthe Mannan MD on 12/24/2009   Method used:   Electronically to        [facility] (retail)       [address]       [place], [place]  [address]       Ph: [phone]        Fax: [phone]   RxID:   [id number]   Current Allergies (reviewed today): ! CODEINE ! SULFA ! AUGMENTIN ! VICODIN ! * COSOPT ! * LOVASTATIN ! * DEET

## 2010-09-08 NOTE — Progress Notes (Signed)
Summary: tramadol  Phone Note Refill Request Message from:  Scriptline on January 07, 2010 7:28 AM  Refills Requested: Medication #1:  TRAMADOL HCL 50 MG  TABS 1 three times a day as needed for severe pain   Supply Requested: 1 month [facility] [address]   Method Requested: Electronic Initial call taken by: Benny Lennert CMA Duncan Dull),  January 07, 2010 7:28 AM  Follow-up for Phone Call        Rx called to pharmacy Follow-up by: Benny Lennert CMA Duncan Dull),  January 07, 2010 12:26 PM    Prescriptions: TRAMADOL HCL 50 MG  TABS (TRAMADOL HCL) 1 three times a day as needed for severe pain  #90 Each x 0   Entered and Authorized by:   Kerby Nora MD   Signed by:   Kerby Nora MD on 01/07/2010   Method used:   Telephoned to ...       [facility] (retail)       [address]       [place], [place]  [address]       Ph: [phone]       Fax: [phone]   RxID:   [id number]

## 2010-09-08 NOTE — Progress Notes (Signed)
  Phone Note Call from Patient   Caller: Patient Call For: Nurse Summary of Call: Pt called insurance company echo will be covered in full.  Pt questioned how much Event monitor would be per day?  Will call monitor company to see what her cost would be. Initial call taken by: Benedict Needy, RN,  February 02, 2010 8:35 AM  Follow-up for Phone Call        Called event monitor company pt's cost would be $968.  Pt called she doest have land line so will contact another company to ask about there self pay prices. Benedict Needy, RN  February 02, 2010 8:59 AM   AF [employer] monitor $298 for 30 days  Follow-up by: Benedict Needy, RN,  February 03, 2010 8:51 AM  Additional Follow-up for Phone Call Additional follow up Details #1::        pt will call and let us know if she wants the event monitor ordered. Benedict Needy, RN  February 04, 2010 4:28 PM

## 2010-09-08 NOTE — Progress Notes (Signed)
  Phone Note Refill Request Message from:  Scriptline on December 02, 2009 7:59 AM  Refills Requested: Medication #1:  TRAMADOL HCL 50 MG  TABS 1 three times a day as needed for severe pain   Supply Requested: [facility] month [facility]   Method Requested: Electronic Initial call taken by: Benny Lennert CMA Duncan Dull),  December 02, 2009 8:00 AM  Follow-up for Phone Call        rx sent electronically not telephoned in Follow-up by: Benny Lennert CMA Duncan Dull),  December 02, 2009 8:26 AM    Prescriptions: TRAMADOL HCL 50 MG  TABS (TRAMADOL HCL) 1 three times a day as needed for severe pain  #90 Each x 0   Entered by:   Benny Lennert CMA (AAMA)   Authorized by:   Kerby Nora MD   Signed by:   Benny Lennert CMA (AAMA) on 12/02/2009   Method used:   Electronically to        [facility] (retail)       [address]       [place], [place]  [address]       Ph: [phone]       Fax: [phone]   RxID:   [id number] TRAMADOL HCL 50 MG  TABS (TRAMADOL HCL) 1 three times a day as needed for severe pain  #90 Each x 0   Entered and Authorized by:   Kerby Nora MD   Signed by:   Kerby Nora MD on 12/02/2009   Method used:   Telephoned to ...       [facility] ([facility]ail)       [address]       [place], [place]  [address]       Ph: [phone]       Fax: [phone]   RxID:   [id number]

## 2010-09-08 NOTE — Assessment & Plan Note (Signed)
Summary: NP6/AMD   Visit Type:  Initial Consult Primary Provider:  Ermalene Searing  CC:  Has experienced several epiosode of chest pain with rapid heart beats. "Had a funny cold feeling with chest discomfort in arms and shoulders and chest with" flip flopping".  History of Present Illness: Ms. deeney is a very pleasant 65 year old woman with a history of fibromyalgia, remote TIAs in 2007 of uncertain etiology, mild obstructive sleep apnea, mitral valve prolapse, a history of baseline tachycardia who has been maintained on beta blockers, history of night terrors, with 3 episodes of profound cardiac tachycardia since May 1 of this year. She presents for evaluation by referral from Dr. Ermalene Searing.  She had her first episode on May 1, with at least 20 minutes of fast beating, racing, shortness of breath. She felt a cold feeling in her chest to her arms. She had a profound upper respiratory infection for approximately one month and during this time on June 1 had a second episode of tachycardia. She usually tries to wait it out and it seems to resolve on its own. It wakes her up from her sleep. She had a third episode 2 weeks ago which was also profound, causing significant shortness of breath and discomfort.  She denies having rhythms like this before. She denies any significant new stress, no new medications or herbal supplements. She does not think cold medications would've contributed to her symptoms.  She stopped smoking 20 years ago.  EKG shows normal sinus rhythm with rate 65 beats per minute, no significant ST or T wave changes.  Current Medications (verified): 1)  Metoprolol Tartrate 50 Mg Tabs (Metoprolol Tartrate) .Marland Kitchen.. 1 Tab Po  Two Times A Day 2)  Omeprazole 20 Mg Tbec (Omeprazole) .... Take 2 By Mouth Daily 3)  Lisinopril-Hydrochlorothiazide 10-12.5 Mg Tabs (Lisinopril-Hydrochlorothiazide) .Marland Kitchen.. 1 Tab By Mouth Daily 4)  Metformin Hcl 500 Mg  Tb24 (Metformin Hcl) .... Take 1 Tablet By Mouth Two Times  A Day 5)  Xalatan 0.005 %  Soln (Latanoprost) .... As Directed 6)  Timolol Maleate 0.5 %  Solg (Timolol Maleate) .... As Directed 7)  Aspirin 81 Mg  Tbec (Aspirin) .... Take One By Mouth Once A Day 8)  Albuterol 90 Mcg/act  Aers (Albuterol) .... 2 Puffs Two Times A Day As Needed 9)  Tramadol Hcl 50 Mg  Tabs (Tramadol Hcl) .Marland Kitchen.. 1 Three Times A Day As Needed For Severe Pain 10)  Tylenol Pm Extra Strength 500-25 Mg  Tabs (Diphenhydramine-Apap (Sleep)) .... Take 1 Tablet By Mouth At Bedtime 11)  Onetouch Ultra Test  Strp (Glucose Blood) .... Use One Daily As Needed 12)  Clobetasol Propionate 0.05 % Foam (Clobetasol Propionate) .... Use Once or Twice Daily 13)  Desonide 0.05 % Lotn (Desonide) .... Apply To Affected Areas As Needed 14)  [facility]vs Cleansing Skin  Crea (Soap & Cleansers) .... Use As Needed 15)  Kerasal 5-10 % Oint (Salicylic Acid-Urea) .... Use As Needed 16)  Gnp Potassium 99 Mg Tabs (Potassium) .... Take 1 By Mouth Once Daily 17)  Mobic 7.5 Mg Tabs (Meloxicam) .... Take 1 Tablet By Mouth Once A Day 18)  Voltaren 1 % Gel (Diclofenac Sodium) .... Apply 2 Grams Two Times A Day  Allergies (verified): 1)  ! Codeine 2)  ! Sulfa 3)  ! Augmentin 4)  ! Vicodin 5)  ! * Cosopt 6)  ! * Lovastatin 7)  ! * Deet  Past History:  Past Medical History: Last updated: 02/18/2009 Allergic rhinitis Colonic polyps,  hx of COPD/asthma Diabetes mellitus, type II---1999 GERD with esophagitis Hypertension Transient ischemic attack, hx of---12/04 Fibromyalgia Tachycardia Glaucoma perforating folliculitis: on antibiotics, creams followed by Cecille Aver Dr Oren Bracket  [phone] Dr Prince Rome  [phone] ----ortho  Past Surgical History: Last updated: 02/18/2009 Hysterectomy ~1989, one ovary remains Lumpectomy, right breast: benign Appendectomy Left knee meniscus repair Lipoma x 2 (Left leg) Carpal tunnel/ trigger finers - multiple CP - Cath negative ([facility]) 1990 EGD- erythematous gastropathy  01/03 Carotids negative 12/04 Sleep study negative 04/02 Echo  negative 08/05 Adenosine myoview - EF 83% negative 08/06 Tonsillectomy  Family History: Last updated: 02/18/2009 Father with  CAD, CVA's, NIDDM, kidney failure Mother with  Breast cancer, HTN, TIAs, valvular disease One brother, DM CAD in  Dad, cousins, aunts, both sides HTN in  multiple family members DM in Dad & widespread, brother No colon cancer hereditary deafness in family Family History Breast cancer 1st degree relative <50  Social History: Last updated: 02/18/2009 Marital Status: Married Children: One daughter, 3 grandchildren Retired as : Customer service manager Former Smoker--quit 1989, 60 pack year history Alcohol use-rare Drug use-no Diet: Fruits and veggies, calcium., water, avoids fried foods Regular exercise-no  Risk Factors: Exercise: no (02/18/2009)  Risk Factors: Smoking Status: quit (05/01/2007)  Review of Systems  The patient denies fever, weight loss, weight gain, vision loss, decreased hearing, hoarseness, chest pain, syncope, dyspnea on exertion, peripheral edema, prolonged cough, abdominal pain, incontinence, muscle weakness, depression, and enlarged lymph nodes.         Tacycardia, SOB, heart racing, SOB with fast heart rate  Vital Signs:  Patient profile:   65 year old female Height:      62 inches Weight:      181 pounds BMI:     33.22 Pulse rate:   65 / minute BP sitting:   120 / 78  (right arm) Cuff size:   regular  Vitals Entered By: Bishop Dublin, CMA (January 27, 2010 10:30 AM)  Physical Exam  General:  Well developed, well nourished, in no acute distress. Head:  normocephalic and atraumatic Neck:  Neck supple, no JVD.  Lungs:  Clear bilaterally to auscultation and percussion. Heart:  Non-displaced PMI, chest non-tender; regular rate and rhythm, S1, S2 with II/VI SEM RSB, no rubs or gallops. Carotid upstroke normal, no bruit.  Pedals normal pulses. No edema, no  varicosities. Abdomen:  Bowel sounds positive; abdomen soft and non-tender without masses Msk:  Back normal, normal gait. Muscle strength and tone normal. Pulses:  pulses normal in all 4 extremities Extremities:  No clubbing or cyanosis. Neurologic:  Alert and oriented x 3. Skin:  Intact without lesions or rashes. Psych:  Normal affect.   Impression & Recommendations:  Problem # 1:  PALPITATIONS (ICD-785.1) her tachycardia/fast heart rate is concerning for some underlying arrhythmia. She has had 3 episodes in the past 2 months. We have suggested she could wear a event monitor to help [place] identify this rhythm. Differential diagnosis includes atrial fibrillation, flutter, SVT, atrial tachycardia.  She is already on a beta blocker. One option would be to either increase the dose gradually or at an alternate medication such as diltiazem. We'll try to check the progress of the event monitor to make sure that she will do it as she has had recent bills from her husband's surgery.  she does have a history of mitral valve prolapse, has a murmur on exam consistent with aortic valve pathology. We have suggested she have an echocardiogram though again  I will cut her estimate her out-of-pocket cost for her prior to doing the study.  Her updated medication list for this problem includes:    Metoprolol Tartrate 50 Mg Tabs (Metoprolol tartrate) .Marland Kitchen... 1 tab po  two times a day    Lisinopril-hydrochlorothiazide 10-12.5 Mg Tabs (Lisinopril-hydrochlorothiazide) .Marland Kitchen... 1 tab by mouth daily    Aspirin 81 Mg Tbec (Aspirin) .Marland Kitchen... Take one by mouth once a day  Patient Instructions: 1)  Your physician recommends that you schedule a follow-up appointment AFTER ALL TESTING IS COMPLETE 2)  Your physician has requested that you have an echocardiogram.  Echocardiography is a painless test that uses sound waves to create images of your heart. It provides your doctor with information about the size and shape of your heart  and how well your heart's chambers and valves are working.  This procedure takes approximately one hour. There are no restrictions for this procedure. WE WILL CALL AND GET INSURANCE VERIFICATION AND SCHEDULE AT A LATER DATE 3)  Your physician has recommended that you wear an event monitor.  Event monitors are medical devices that record the heart's electrical activity. Doctors most often use these monitors to diagnose arrhythmias. Arrhythmias are problems with the speed or rhythm of the heartbeat. The monitor is a small, portable device. You can wear one while you do your normal daily activities. This is usually used to diagnose what is causing palpitations/syncope (passing out). WE WILL GET INSURANCE VERIFICATION AND CALL YOU TO SET UP MONITOR

## 2010-09-08 NOTE — Assessment & Plan Note (Signed)
Summary: flu/ bedsole/alc  Nurse Visit   Allergies: 1)  ! Codeine 2)  ! Sulfa 3)  ! Augmentin 4)  ! Vicodin 5)  ! * Cosopt 6)  ! * Lovastatin 7)  ! * Deet  Orders Added: 1)  Admin 1st Vaccine [90471] 2)  Flu Vaccine 57yrs + [72536]  Flu Vaccine Consent Questions     Do you have a history of severe allergic reactions to this vaccine? no    Any prior history of allergic reactions to egg and/or gelatin? no    Do you have a sensitivity to the preservative Thimersol? no    Do you have a past history of Guillan-Barre Syndrome? no    Do you currently have an acute febrile illness? no    Have you ever had a severe reaction to latex? no    Vaccine information given and explained to patient? yes    Are you currently pregnant? no    Lot Number:[id number]   Exp Date:02/06/2011   Site Given  Left Deltoid IM1
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
## 2010-09-08 NOTE — Progress Notes (Signed)
Summary: refill request for mobic  Phone Note Refill Request Message from:  Fax from Pharmacy  Refills Requested: Medication #1:  MOBIC 7.5 MG TABS Take 1 tablet by mouth once a day   Last Refilled: 05-13-1946 Faxed request from [facility].  Initial call taken by: Lowella Petties CMA,  March 10, 2010 10:19 AM    Prescriptions: MOBIC 7.5 MG TABS (MELOXICAM) Take 1 tablet by mouth once a day  #90 x 3   Entered and Authorized by:   Kerby Nora MD   Signed by:   Kerby Nora MD on 03/10/2010   Method used:   Electronically to        [facility] (retail)       [address]       [place], [place]  [address]       Ph: [phone]       Fax: [phone]   RxID:   [id number]

## 2010-09-08 NOTE — Progress Notes (Signed)
  Phone Note Refill Request Message from:  [employer] on August 29, 2009 7:51 AM  Refills Requested: Medication #1:  TRAMADOL HCL 50 MG  TABS 1 three times a day as needed for severe pain   Supply Requested: 1 month [facility]   Method Requested: Electronic Initial call taken by: Benny Lennert CMA Duncan Dull),  August 29, 2009 7:52 AM  Follow-up for Phone Call        rx called to pharmacy Follow-up by: Benny Lennert CMA Duncan Dull),  August 29, 2009 10:31 AM    Prescriptions: TRAMADOL HCL 50 MG  TABS (TRAMADOL HCL) 1 three times a day as needed for severe pain  #90 Each x 0   Entered and Authorized by:   Kerby Nora MD   Signed by:   Kerby Nora MD on 08/29/2009   Method used:   Telephoned to ...       [facility] (retail)       [address]       [place], [place]  [address]       Ph: [phone]       Fax: [phone]   RxID:   [id number]

## 2010-09-08 NOTE — Letter (Signed)
Summary: PHI  PHI   Imported By: Harlon Flor 01/28/2010 08:35:30  _____________________________________________________________________  External Attachment:    Type:   Image     Comment:   External Document

## 2010-09-08 NOTE — Assessment & Plan Note (Signed)
Summary: cpx/alc   Vital Signs:  Patient profile:   65 year old female Height:      62 inches Weight:      177.8 pounds BMI:     32.64 Temp:     98.3 degrees F oral Pulse rate:   68 / minute Pulse rhythm:   regular BP sitting:   130 / 80  (left arm) Cuff size:   regular  Vitals Entered By: Benny Lennert CMA Duncan Dull) (July 17, 2010 9:38 AM)  History of Present Illness: Chief complaint Health MAintanance.  DM: She is overdue for lab eval... labs drawn before appt today.  Eats as a vegetarian... low fat diet. Small portion size. Walking   Seeing Dr Jorge Mandril...for steroid injection in left hip...for bursitis  Noted swelling  in left abdomen for a year...intermittant pain. HAs history of abdominal surgeries.    See GYN for CPX yearly... update prevention lists.   Hypertension History:      She denies headache, chest pain, palpitations, dyspnea with exertion, orthopnea, peripheral edema, visual symptoms, neurologic problems, syncope, and side effects from treatment.  Well controlled at home. Marland Kitchen        Positive major cardiovascular risk factors include female age 55 years old or older, diabetes, hyperlipidemia, and hypertension.  Negative major cardiovascular risk factors include non-tobacco-user status.        Positive history for target organ damage include prior stroke (or TIA).  Further assessment for target organ damage reveals no history of renal insufficiency.    Lipid Management History:      Positive NCEP/ATP III risk factors include female age 39 years old or older, diabetes, hypertension, and prior stroke (or TIA).  Negative NCEP/ATP III risk factors include non-tobacco-user status.        Comments include: Tried red yeast rice...gave SE, vaginal swelling Not on any medicaiton...not tolerated statin meds in past. never welchol.  Comments: Over diue for cholesterol check. .     Problems Prior to Update: 1)  Palpitations  (ICD-785.1) 2)  Sinusitis, Acute   (ICD-461.9) 3)  Shoulder Pain, Bilateral  (ICD-719.41) 4)  Abdominal Pain, Left Upper Quadrant  (ICD-789.02) 5)  Other Screening Mammogram  (ICD-V76.12) 6)  Insomnia, Chronic  (ICD-307.42) 7)  Family History Breast Cancer 1st Degree Relative <50  (ICD-V16.3) 8)  Osteoarthritis, Hands, Bilateral  (ICD-715.94) 9)  Screening For Mlig Neop, Breast, Nos  (ICD-V76.10) 10)  Hip Pain, Left  (ICD-719.45) 11)  Glaucoma Nos  (ICD-365.9) 12)  Fibromyalgia  (ICD-729.1) 13)  Hypercholesterolemia  (ICD-272.0) 14)  Transient Ischemic Attack, Hx of  (ICD-V12.50) 15)  Hypertension  (ICD-401.9) 16)  Gerd  (ICD-530.81) 17)  Diabetes Mellitus, Type II  (ICD-250.00) 18)  COPD  (ICD-496) 19)  Colonic Polyps, Hx of  (ICD-V12.72) 20)  Allergic Rhinitis  (ICD-477.9)  Allergies: 1)  ! Codeine 2)  ! Sulfa 3)  ! Augmentin 4)  ! Vicodin 5)  ! * Cosopt 6)  ! * Lovastatin 7)  ! * Deet  Review of Systems General:  Denies fatigue and fever. CV:  Denies chest pain or discomfort; Palpitations resolved. Resp:  Complains of shortness of breath; denies cough, coughing up blood, sputum productive, and wheezing; Occ stable SOB feeling.. uses inhaler rarely. GI:  Denies abdominal pain, bloody stools, constipation, and diarrhea. GU:  Denies dysuria.  Physical Exam  General:  overweight appearing female in NAD  Ears:  External ear exam shows no significant lesions or deformities.  Otoscopic examination reveals clear canals,  tympanic membranes are intact bilaterally without bulging, retraction, inflammation or discharge. Hearing is grossly normal bilaterally. Nose:  External nasal examination shows no deformity or inflammation. Nasal mucosa are pink and moist without lesions or exudates. Mouth:  Oral mucosa and oropharynx without lesions or exudates.  Teeth in good repair. Neck:  no carotid bruit or thyromegaly no cervical or supraclavicular lymphadenopathy  Chest Wall:  per GYN  Breasts:  per GYN  Lungs:  Normal  respiratory effort, chest expands symmetrically. Lungs are clear to auscultation, no crackles or wheezes. Heart:  Normal rate and regular rhythm. S1 and S2 normal without gallop, murmur, click, rub or other extra sounds. Abdomen:  ttp mid left lower quadrant...no mass palpated.Marland Kitchen also ttp over left pelvic bone anterior laterally Genitalia:  per GYN  Msk:  Nml gait.  Pulses:  R and L posterior tibial pulses are full and equal bilaterally  Extremities:  no edema  Neurologic:  No cranial nerve deficits noted. Station and gait are normal. Plantar reflexes are down-going bilaterally. DTRs are symmetrical throughout. Sensory, motor and coordinative functions appear intact.  Diabetes Management Exam:    Foot Exam (with socks and/or shoes not present):       Sensory-Pinprick/Light touch:          Left medial foot (L-4): normal          Left dorsal foot (L-5): normal          Left lateral foot (S-1): normal          Right medial foot (L-4): normal          Right dorsal foot (L-5): normal          Right lateral foot (S-1): normal       Sensory-Monofilament:          Left foot: normal          Right foot: normal       Inspection:          Left foot: normal          Right foot: normal       Nails:          Left foot: normal          Right foot: normal    Eye Exam:       Eye Exam done elsewhere          Date: 03/09/2010          Results: no DM changes          Done by: Brasington   Impression & Recommendations:  Problem # 1:  PALPITATIONS (ICD-785.1) Assessment Improved Resolved. ? due to stress. Her updated medication list for this problem includes:    Metoprolol Tartrate 50 Mg Tabs (Metoprolol tartrate) .Marland Kitchen... 1 tab po  two times a day  Problem # 2:  ABDOMINAL TENDERNESS, LEFT LOWER QUADRANT (ZOX-096.04) Assessment: New No GI or urinary symptoms. DVE nml per GYN.  no hernia palpated. Sed for pelvic US given ovaries remain Orders: Radiology Referral (Radiology)  Problem # 3:   HYPERCHOLESTEROLEMIA (ICD-272.0)  UNclear control, awaiting lab results. Pt open to taking welchol if not at goal.  Orders: Venipuncture (54098) Specimen Handling (11914) T-Comprehensive Metabolic Panel [phone]) T-Lipid Profile [phone]) T- Hemoglobin A1C (95284-13244)  Labs Reviewed: SGOT: 18 (01/21/2010)   SGPT: 17 (01/21/2010)  Lipid Goals: Chol Goal: 200 (07/17/2010)   HDL Goal: 40 (07/17/2010)   LDL Goal: 100 (07/17/2010)   TG Goal: 150 (07/17/2010)  10  Yr Risk Heart Disease: 15 % Prior 10 Yr Risk Heart Disease: Not enough information (02/18/2009)   HDL:48 (06/09/2009), 50 (02/28/2009)  LDL:88 (06/09/2009), 139 (47/82/9562)  Chol:198 (06/09/2009), 230 (02/28/2009)  Trig:308 (06/09/2009), 203 (02/28/2009)  Problem # 4:  DIABETES MELLITUS, TYPE II (ICD-250.00)  Unclear control. Awaiting labs.  Her updated medication list for this problem includes:    Lisinopril-hydrochlorothiazide 10-12.5 Mg Tabs (Lisinopril-hydrochlorothiazide) .Marland Kitchen... 1 tab by mouth daily    Metformin Hcl 500 Mg Tb24 (Metformin hcl) .Marland Kitchen... Take 1 tablet by mouth two times a day    Aspirin 81 Mg Tbec (Aspirin) .Marland Kitchen... Take one by mouth once a day  Orders: Venipuncture (13086) Specimen Handling (57846) T-Comprehensive Metabolic Panel [id number]) T-Lipid Profile [phone]) T- Hemoglobin A1C (36644-03474) T- * Misc. Laboratory test [phone])  Labs Reviewed: Creat: 0.81 (01/21/2010)     Last Eye Exam: no DM changes (03/09/2010) Reviewed HgBA1c results: 6.6 (05/28/2009)  6.7 (03/07/2009)  Problem # 5:  HYPERTENSION (ICD-401.9) Well controlled. Continue current medication.  Her updated medication list for this problem includes:    Metoprolol Tartrate 50 Mg Tabs (Metoprolol tartrate) .Marland Kitchen... 1 tab po  two times a day    Lisinopril-hydrochlorothiazide 10-12.5 Mg Tabs (Lisinopril-hydrochlorothiazide) .Marland Kitchen... 1 tab by mouth daily  Orders: Venipuncture (38756) Specimen Handling (43329) T-Comprehensive  Metabolic Panel [phone]) T-Lipid Profile [id number]) T- Hemoglobin A1C (35573-22025)  Problem # 6:  TRANSIENT ISCHEMIC ATTACK, HX OF (ICD-V12.50) LDL goal <70.   Complete Medication List: 1)  Metoprolol Tartrate 50 Mg Tabs (Metoprolol tartrate) .Marland Kitchen.. 1 tab po  two times a day 2)  Omeprazole 20 Mg Tbec (Omeprazole) .... Take 2 by mouth daily 3)  Lisinopril-hydrochlorothiazide 10-12.5 Mg Tabs (Lisinopril-hydrochlorothiazide) .Marland Kitchen.. 1 tab by mouth daily 4)  Metformin Hcl 500 Mg Tb24 (Metformin hcl) .... Take 1 tablet by mouth two times a day 5)  Xalatan 0.005 % Soln (Latanoprost) .... As directed 6)  Timolol Maleate 0.5 % Solg (Timolol maleate) .... As directed 7)  Aspirin 81 Mg Tbec (Aspirin) .... Take one by mouth once a day 8)  Albuterol 90 Mcg/act Aers (Albuterol) .... 2 puffs two times a day as needed 9)  Tramadol Hcl 50 Mg Tabs (Tramadol hcl) .Marland Kitchen.. 1 three times a day as needed for severe pain 10)  Onetouch Ultra Test Strp (Glucose blood) .... Use one daily as needed 11)  Clobetasol Propionate 0.05 % Foam (Clobetasol propionate) .... Use once or twice daily 12)  Desonide 0.05 % Lotn (Desonide) .... Apply to affected areas as needed 13)  [facility] Cleansing Skin Crea (Soap & cleansers) .... Use as needed 14)  Kerasal 5-10 % Oint (Salicylic acid-urea) .... Use as needed 15)  Mobic 7.5 Mg Tabs (Meloxicam) .... Take 1 tablet by mouth once a day 16)  Voltaren 1 % Gel (Diclofenac sodium) .... Apply 2 grams two times a day  Hypertension Assessment/Plan:      The patient's hypertensive risk group is category C: Target organ damage and/or diabetes.  Her calculated 10 year risk of coronary heart disease is 15 %.  Today's blood pressure is 130/80.  Her blood pressure goal is < 130/80.  Lipid Assessment/Plan:      Based on NCEP/ATP III, the patient's risk factor category is "history of coronary disease, peripheral vascular disease, cerebrovascular disease, or aortic aneurysm along with either  diabetes, current smoker, or LDL > 130 plus HDL < 40 plus triglycerides > 200".  The patient's lipid goals are as follows: Total cholesterol goal is 200; LDL cholesterol  goal is 70; HDL cholesterol goal is 40; Triglyceride goal is 150.  Her LDL cholesterol goal has not been met.    Patient Instructions: 1)  Keep appt for mammogram.  2)  Please schedule a follow-up appointment in 3 months .  3)  HgBA1c prior to visit  ICD-9: 250.00 4)  Referral Appointment Information 5)  Day/Date: 6)  Time: 7)  Place/MD: 8)  Address: 9)  Phone/Fax: 10)  Patient given appointment information. Information/Orders faxed/mailed.  Prescriptions: METFORMIN HCL 500 MG  TB24 (METFORMIN HCL) Take 1 tablet by mouth two times a day  #180 x 3   Entered and Authorized by:   Kerby Nora MD   Signed by:   Kerby Nora MD on 07/17/2010   Method used:   Electronically to        [facility]  [address] (retail)       [address]       [place], [place]  [address]       Ph: [phone]       Fax: [phone]   RxID:   [id number]    Orders Added: 1)  Venipuncture [27253] 2)  Specimen Handling [99000] 3)  T-Comprehensive Metabolic Panel [[id number]] 4)  T-Lipid Profile [80061-22930] 5)  T- Hemoglobin A1C [83036-23375] 6)  T- * Misc. Laboratory test [phone] 7)  Radiology Referral [Radiology] 8)  Est. Patient Level IV [34742]    Current Allergies (reviewed today): ! CODEINE ! SULFA ! AUGMENTIN ! VICODIN ! * COSOPT ! * LOVASTATIN ! * DEET  Last Flu Vaccine:  Fluvax 3+ (06/17/2010 1:38:53 PM) Flu Vaccine Next Due:  1 yr Herpes Zoster Next Due:  Refused Last PAP:  DVE (06/06/2009 1:45:52 PM) PAP Result Date:  03/09/2010 PAP Result:  normal DVE at GYN PAP Next Due:  1 yr

## 2010-09-08 NOTE — Miscellaneous (Signed)
Summary: Orders Update  Clinical Lists Changes  Orders: Added new Referral order of Echocardiogram (Echo) - Signed 

## 2010-09-08 NOTE — Progress Notes (Signed)
----   Converted from flag ---- ---- 01/27/2010 11:48 AM, Marilynne Halsted, CMA, AAMA wrote: Pt will need to call her ins co.  the procedure codes (CPT) are 04540 and 93306  ---- 01/27/2010 11:46 AM, Benedict Needy, RN wrote: Pt has [employer], will need to know out of pocket expense before we schedule echo and order event monitor.  Echo, Dr. Mariah Milling, Tachy/SOB  30 day Event monitor, Dr. Mariah Milling, Tachy/SOB ------------------------------  Called to to given her the CPT codes for echo and event monitor. Pt will call her insurance company and let us know if she wants of go foward with there test.

## 2010-09-08 NOTE — Consult Note (Signed)
Summary: [facility]   Imported By: Lanelle Bal 03/23/2010 09:37:11  _____________________________________________________________________  External Attachment:    Type:   Image     Comment:   External Document

## 2010-09-08 NOTE — Progress Notes (Signed)
Summary: still not feeling well  Phone Note Call from Patient Call back at Home Phone [phone]   Caller: Patient Call For: Kerby Nora MD Reason for Call: Lab or Test Results Summary of Call: Patient was seen on 5-18 and now has finished her med. She is not feeling better. She has a nonstop cough and wheezing. No fever. She says that she does not have as much green mucus as before and says that it is not as thick as it was.  She wants to know if she can have another round of antibiotic. [facility] [address]  Initial call taken by: Melody Comas,  Dec 29, 2009 10:38 AM  Follow-up for Phone Call        No.  Zpack is still in her system.  Takes time.  Please allow another 4 or 5 days.  If no improvement, call back. Ruthe Mannan MD  Dec 29, 2009 10:48 AM   Patient advised. Follow-up by: Melody Comas,  Dec 29, 2009 10:59 AM

## 2010-09-08 NOTE — Progress Notes (Signed)
Summary: Spot to be removed  Phone Note Call from Patient Call back at Home Phone [phone]   Caller: Patient Call For: Kerby Nora MD Summary of Call: Patient says that she has a spot on her vulva.  She has had yeast infections for about 1 week, treated it herself with OTC medications and the yeast infection finally went away.   Now she noticed a dark spot on her vulva that is raised in one area.  It is painful.  Says that she thinks it should be removed and wanted to know if you would be willing to remove it or should she go to her Gynocologist?  Please advise. Initial call taken by: Linde Gillis CMA Duncan Dull),  October 10, 2009 10:15 AM  Follow-up for Phone Call        I would recommend seeing GYN (they are surgeons) Follow-up by: Kerby Nora MD,  October 10, 2009 10:20 AM  Additional Follow-up for Phone Call Additional follow up Details #1::        Patient advised.Consuello Masse CMA   Patient would like to go to a women gyn needs referral Additional Follow-up by: Benny Lennert CMA Duncan Dull),  October 10, 2009 10:52 AM    Additional Follow-up for Phone Call Additional follow up Details #2::    Oh..I though she already had a gyn..we can start by exam here..then if needed will refer.  Follow-up by: Kerby Nora MD,  October 10, 2009 11:54 AM  Additional Follow-up for Phone Call Additional follow up Details #3:: Details for Additional Follow-up Action Taken: Patient advised and she will call and make appt sometime next week Additional Follow-up by: Benny Lennert CMA Duncan Dull),  October 10, 2009 3:03 PM

## 2010-10-02 ENCOUNTER — Ambulatory Visit (INDEPENDENT_AMBULATORY_CARE_PROVIDER_SITE_OTHER): Payer: PRIVATE HEALTH INSURANCE | Admitting: Family Medicine

## 2010-10-02 ENCOUNTER — Encounter: Payer: Self-pay | Admitting: Family Medicine

## 2010-10-02 DIAGNOSIS — R300 Dysuria: Secondary | ICD-10-CM | POA: Insufficient documentation

## 2010-10-02 DIAGNOSIS — M549 Dorsalgia, unspecified: Secondary | ICD-10-CM | POA: Insufficient documentation

## 2010-10-02 DIAGNOSIS — M5442 Lumbago with sciatica, left side: Secondary | ICD-10-CM | POA: Insufficient documentation

## 2010-10-02 DIAGNOSIS — L0291 Cutaneous abscess, unspecified: Secondary | ICD-10-CM | POA: Insufficient documentation

## 2010-10-02 DIAGNOSIS — M545 Low back pain, unspecified: Secondary | ICD-10-CM | POA: Insufficient documentation

## 2010-10-02 DIAGNOSIS — L0390 Cellulitis, unspecified: Secondary | ICD-10-CM

## 2010-10-02 LAB — CONVERTED CEMR LAB
Blood in Urine, dipstick: NEGATIVE
Nitrite: NEGATIVE
Protein, U semiquant: NEGATIVE
Urobilinogen, UA: 0.2
WBC Urine, dipstick: NEGATIVE

## 2010-10-03 ENCOUNTER — Encounter: Payer: Self-pay | Admitting: Family Medicine

## 2010-10-06 NOTE — Assessment & Plan Note (Signed)
Summary: kidney infection   Vital Signs:  Patient profile:   65 year old female Height:      62 inches Weight:      177 pounds BMI:     32.49 Temp:     99.9 degrees F oral Pulse rate:   66 / minute Pulse rhythm:   regular BP sitting:   120 / 80  (left arm) Cuff size:   regular  Vitals Entered ByMelody Comas (October 02, 2010 4:02 PM) CC: uti, sore on breast    History of Present Illness: Also has noted red lesion near lefft nipple..no discharge.  Has hx of perforating  foliculitis on face.  Use desonide on area... made peel but did not change the size.     Acute Visit History:      The patient complains of genitourinary symptoms.  The genitourinary symptoms have been present for 3 weeks.  She notes a history of dysuria and frequency.  She denies constipation, diarrhea, fever, or hematuria.  Comments: odor of urine and pain in low/mid back began this week.  This feels very typical for UTI for pt.        Problems Prior to Update: 1)  Abdominal Tenderness, Left Lower Quadrant  (ICD-789.64) 2)  Palpitations  (ICD-785.1) 3)  Sinusitis, Acute  (ICD-461.9) 4)  Shoulder Pain, Bilateral  (ICD-719.41) 5)  Abdominal Pain, Left Upper Quadrant  (ICD-789.02) 6)  Other Screening Mammogram  (ICD-V76.12) 7)  Insomnia, Chronic  (ICD-307.42) 8)  Family History Breast Cancer 1st Degree Relative <50  (ICD-V16.3) 9)  Osteoarthritis, Hands, Bilateral  (ICD-715.94) 10)  Screening For Mlig Neop, Breast, Nos  (ICD-V76.10) 11)  Hip Pain, Left  (ICD-719.45) 12)  Glaucoma Nos  (ICD-365.9) 13)  Fibromyalgia  (ICD-729.1) 14)  Hypercholesterolemia  (ICD-272.0) 15)  Transient Ischemic Attack, Hx of  (ICD-V12.50) 16)  Hypertension  (ICD-401.9) 17)  Gerd  (ICD-530.81) 18)  Diabetes Mellitus, Type II  (ICD-250.00) 19)  COPD  (ICD-496) 20)  Colonic Polyps, Hx of  (ICD-V12.72) 21)  Allergic Rhinitis  (ICD-477.9)  Current Medications (verified): 1)  Metoprolol Tartrate 50 Mg Tabs (Metoprolol  Tartrate) .Marland Kitchen.. 1 Tab Po  Two Times A Day 2)  Omeprazole 20 Mg Tbec (Omeprazole) .... Take 2 By Mouth Daily 3)  Lisinopril-Hydrochlorothiazide 10-12.5 Mg Tabs (Lisinopril-Hydrochlorothiazide) .Marland Kitchen.. 1 Tab By Mouth Daily 4)  Metformin Hcl 500 Mg  Tb24 (Metformin Hcl) .... Take 1 Tablet By Mouth Two Times A Day 5)  Xalatan 0.005 %  Soln (Latanoprost) .... As Directed 6)  Timolol Maleate 0.5 %  Solg (Timolol Maleate) .... As Directed 7)  Aspirin 81 Mg  Tbec (Aspirin) .... Take One By Mouth Once A Day 8)  Albuterol 90 Mcg/act  Aers (Albuterol) .... 2 Puffs Two Times A Day As Needed 9)  Tramadol Hcl 50 Mg  Tabs (Tramadol Hcl) .Marland Kitchen.. 1 Three Times A Day As Needed For Severe Pain 10)  Onetouch Ultra Test  Strp (Glucose Blood) .... Use One Daily As Needed 11)  Clobetasol Propionate 0.05 % Foam (Clobetasol Propionate) .... Use Once or Twice Daily 12)  Desonide 0.05 % Lotn (Desonide) .... Apply To Affected Areas As Needed 13)  Cvs Cleansing Skin  Crea (Soap & Cleansers) .... Use As Needed 14)  Kerasal 5-10 % Oint (Salicylic Acid-Urea) .... Use As Needed 15)  Mobic 7.5 Mg Tabs (Meloxicam) .... Take 1 Tablet By Mouth Once A Day 16)  Voltaren 1 % Gel (Diclofenac Sodium) .... Apply 2 Grams Two Times  A Day  Allergies (verified): 1)  ! Codeine 2)  ! Sulfa 3)  ! Augmentin 4)  ! Vicodin 5)  ! * Cosopt 6)  ! * Lovastatin 7)  ! * Deet  Past History:  Past medical, surgical, family and social histories (including risk factors) reviewed, and no changes noted (except as noted below).  Past Medical History: Reviewed history from 02/18/2009 and no changes required. Allergic rhinitis Colonic polyps, hx of COPD/asthma Diabetes mellitus, type II---1999 GERD with esophagitis Hypertension Transient ischemic attack, hx of---12/04 Fibromyalgia Tachycardia Glaucoma perforating folliculitis: on antibiotics, creams followed by Cecille Aver Dr Oren Bracket  [phone] Dr Prince Rome  [phone] ----ortho  Past Surgical  History: Reviewed history from 02/18/2009 and no changes required. Hysterectomy ~1989, one ovary remains Lumpectomy, right breast: benign Appendectomy Left knee meniscus repair Lipoma x 2 (Left leg) Carpal tunnel/ trigger finers - multiple CP - Cath negative ([facility]) 1990 EGD- erythematous gastropathy 01/03 Carotids negative 12/04 Sleep study negative 04/02 Echo  negative 08/05 Adenosine myoview - EF 83% negative 08/06 Tonsillectomy  Family History: Reviewed history from 02/18/2009 and no changes required. Father with  CAD, CVA's, NIDDM, kidney failure Mother with  Breast cancer, HTN, TIAs, valvular disease One brother, DM CAD in  Dad, cousins, aunts, both sides HTN in  multiple family members DM in Dad & widespread, brother No colon cancer hereditary deafness in family Family History Breast cancer 1st degree relative <50  Social History: Reviewed history from 02/18/2009 and no changes required. Marital Status: Married Children: One daughter, 3 grandchildren Retired as : Customer service manager Former Smoker--quit 1989, 60 pack year history Alcohol use-rare Drug use-no Diet: Fruits and veggies, calcium., water, avoids fried foods Regular exercise-no  Review of Systems General:  Denies fatigue. CV:  Denies chest pain or discomfort. Resp:  Denies shortness of breath. GI:  Denies bloody stools.  Physical Exam  General:  overweight appearing female in NAD  Nose:  External nasal examination shows no deformity or inflammation. Nasal mucosa are pink and moist without lesions or exudates. Mouth:  MMM Neck:  no carotid bruit or thyromegaly no cervical or supraclavicular lymphadenopathy  Lungs:  Normal respiratory effort, chest expands symmetrically. Lungs are clear to auscultation, no crackles or wheezes. Heart:  Normal rate and regular rhythm. S1 and S2 normal without gallop, murmur, click, rub or other extra sounds. Abdomen:  Bowel sounds positive,abdomen soft and non-tender  without masses, organomegaly or hernias noted. Msk:  diffuse paraspinous muscle ttp, , B buttock pain, neg SLR Neurologic:  No cranial nerve deficits noted. Station and gait are normal. Plantar reflexes are down-going bilaterally. DTRs are symmetrical throughout. Sensory, motor and coordinative functions appear intact. Skin:  erythematous 2 cm lesion on left breast near nipple... central pore... firm, no fluctuance, warm   Impression & Recommendations:  Problem # 1:  DYSURIA (ICD-788.1)  Not clearly UTI although pt states she feels exactly as she has in past.  Will send for culture to rule out infection. In meantime.Marland Kitchen avoid bladder irritants, push fluids/water intake.   Her updated medication list for this problem includes:    Doxycycline Hyclate 100 Mg Tabs (Doxycycline hyclate) .Marland Kitchen... 1 tab by mouth daily x 7 days  Orders: T-Culture, Urine ([id number]) UA Dipstick W/ Micro (manual) (21308)  Problem # 2:  BACK PAIN (ICD-724.5) Most likely MSK strain.. not clearly related to UTi. No sign of radiculopathy.  Treat with NSAIDs, heat and stretching.   Her updated medication list for this problem includes:  Aspirin 81 Mg Tbec (Aspirin) .Marland Kitchen... Take one by mouth once a day    Tramadol Hcl 50 Mg Tabs (Tramadol hcl) .Marland Kitchen... 1 three times a day as needed for severe pain    Mobic 7.5 Mg Tabs (Meloxicam) .Marland Kitchen... Take 1 tablet by mouth once a day  Problem # 3:  ABSCESS, SKIN (ICD-682.9) No clear pus pocket to I anmd D.... will treat with warm compresses and start on basic antibiotics. Not at increase risk for MRSA. Call if not improving.  Her updated medication list for this problem includes:    Doxycycline Hyclate 100 Mg Tabs (Doxycycline hyclate) .Marland Kitchen... 1 tab by mouth daily x 7 days  Complete Medication List: 1)  Metoprolol Tartrate 50 Mg Tabs (Metoprolol tartrate) .Marland Kitchen.. 1 tab po  two times a day 2)  Omeprazole 20 Mg Tbec (Omeprazole) .... Take 2 by mouth daily 3)   Lisinopril-hydrochlorothiazide 10-12.5 Mg Tabs (Lisinopril-hydrochlorothiazide) .Marland Kitchen.. 1 tab by mouth daily 4)  Metformin Hcl 500 Mg Tb24 (Metformin hcl) .... Take 1 tablet by mouth two times a day 5)  Xalatan 0.005 % Soln (Latanoprost) .... As directed 6)  Timolol Maleate 0.5 % Solg (Timolol maleate) .... As directed 7)  Aspirin 81 Mg Tbec (Aspirin) .... Take one by mouth once a day 8)  Albuterol 90 Mcg/act Aers (Albuterol) .... 2 puffs two times a day as needed 9)  Tramadol Hcl 50 Mg Tabs (Tramadol hcl) .Marland Kitchen.. 1 three times a day as needed for severe pain 10)  Onetouch Ultra Test Strp (Glucose blood) .... Use one daily as needed 11)  Clobetasol Propionate 0.05 % Foam (Clobetasol propionate) .... Use once or twice daily 12)  Desonide 0.05 % Lotn (Desonide) .... Apply to affected areas as needed 13)  Cvs Cleansing Skin Crea (Soap & cleansers) .... Use as needed 14)  Kerasal 5-10 % Oint (Salicylic acid-urea) .... Use as needed 15)  Mobic 7.5 Mg Tabs (Meloxicam) .... Take 1 tablet by mouth once a day 16)  Voltaren 1 % Gel (Diclofenac sodium) .... Apply 2 grams two times a day 17)  Doxycycline Hyclate 100 Mg Tabs (Doxycycline hyclate) .Marland Kitchen.. 1 tab by mouth daily x 7 days  Patient Instructions: 1)  Avoid bladder irritants,such as citris, tomato, caffeine, chocolate, soda,alcohol. 2)   Push fluids/water intake.  3)   We will call with culture resutls. 4)  Apply warm compress to breast lesion two times a day, start oral antibiotic  .Marland Kitchen call if not improving or redness spreading in next few days. 5)  Increase mobic to 15 mg daily fr back pain as needed. 6)  Heat on back, gentle stretching. Prescriptions: DOXYCYCLINE HYCLATE 100 MG TABS (DOXYCYCLINE HYCLATE) 1 tab by mouth daily x 7 days  #7 x 0   Entered and Authorized by:   Kerby Nora MD   Signed by:   Kerby Nora MD on 10/02/2010   Method used:   Electronically to        [facility]  [address] (retail)       [address]       [place], [place]  [address]       Ph: [phone]       Fax: [phone]   RxID:   [id number]    Orders Added: 1)  T-Culture, Urine [[id number]] 2)  UA Dipstick W/ Micro (manual) [81000] 3)  Est. Patient Level IV [64403]    Laboratory Results   Urine Tests  Date/Time Received: October 02, 2010 3:51 PM Date/Time Reported: October 02, 2010 3:51 PM  Routine Urinalysis   Color: yellow Appearance: Clear Glucose: negative   (Normal Range: Negative) Bilirubin: negative   (Normal Range: Negative) Ketone: negative   (Normal Range: Negative) Spec. Gravity: 1.015   (Normal Range: 1.003-1.035) Blood: negative   (Normal Range: Negative) pH: 6.0   (Normal Range: 5.0-8.0) Protein: negative   (Normal Range: Negative) Urobilinogen: 0.2   (Normal Range: 0-1) Nitrite: negative   (Normal Range: Negative) Leukocyte Esterace: negative   (Normal Range: Negative)

## 2010-10-21 ENCOUNTER — Other Ambulatory Visit: Payer: Self-pay

## 2010-11-02 ENCOUNTER — Other Ambulatory Visit: Payer: Self-pay | Admitting: Family Medicine

## 2010-11-02 DIAGNOSIS — E7800 Pure hypercholesterolemia, unspecified: Secondary | ICD-10-CM

## 2010-11-03 ENCOUNTER — Other Ambulatory Visit (INDEPENDENT_AMBULATORY_CARE_PROVIDER_SITE_OTHER): Payer: PRIVATE HEALTH INSURANCE | Admitting: Family Medicine

## 2010-11-03 DIAGNOSIS — E7800 Pure hypercholesterolemia, unspecified: Secondary | ICD-10-CM

## 2010-11-03 DIAGNOSIS — E785 Hyperlipidemia, unspecified: Secondary | ICD-10-CM

## 2010-11-03 LAB — LIPID PANEL
Cholesterol: 251 mg/dL — ABNORMAL HIGH (ref 0–200)
HDL: 60.1 mg/dL (ref >39.00)
Total CHOL/HDL Ratio: 4
Triglycerides: 256 mg/dL — ABNORMAL HIGH (ref 0.0–149.0)
VLDL: 51.2 mg/dL — ABNORMAL HIGH (ref 0.0–40.0)

## 2010-11-11 ENCOUNTER — Other Ambulatory Visit: Payer: PRIVATE HEALTH INSURANCE

## 2010-12-11 ENCOUNTER — Ambulatory Visit: Payer: PRIVATE HEALTH INSURANCE | Admitting: Family Medicine

## 2010-12-15 ENCOUNTER — Encounter (INDEPENDENT_AMBULATORY_CARE_PROVIDER_SITE_OTHER): Payer: PRIVATE HEALTH INSURANCE | Admitting: Family Medicine

## 2010-12-15 DIAGNOSIS — L940 Localized scleroderma [morphea]: Secondary | ICD-10-CM

## 2010-12-18 NOTE — Assessment & Plan Note (Signed)
NAME:  Cathy Mullins, Cathy Mullins NO.:  [id number]  MEDICAL RECORD NO.:  [id number]           PATIENT TYPE:  LOCATION:  [facility]           FACILITY:  PHYSICIAN:  Tinnie Gens, MD        DATE OF BIRTH:  1946-05-19  DATE OF SERVICE:  12/15/2010                                 CLINIC NOTE  CHIEF COMPLAINT:  Growth on labia.  HISTORY OF PRESENT ILLNESS:  The patient is a 65 year old gravida 1, para 1, who is status post hysterectomy for some sort of tumor.  She also underwent an LSO at that time.  She has been in her normal state of decent health until approximately one half years ago when her doctor started her on red rice yeast for elevated cholesterol.  She was allergic to cholesterol-lowering medications.  During that time, she developed what she thought was chronic yeast infections with swelling and irritation of her labia.  She thinks this has been going on for some time.  She stopped the red rice yeast and felt like her symptoms got somewhat better; however, she continues to have irritation, most recently she has been using topical yeast treatments which have irritated things further.  She has chronic itching and some swelling when her symptoms are flaring.  The patient seen one gynecologist who just told her that she was getting older and this was normal.  PAST MEDICAL HISTORY:  Significant for TIA, possible coronary event in her 50s with none since.  She is diabetic.  She has hypertension.  She has osteoarthritis, fibromyalgia, elevated cholesterol, glaucoma.  SURGICAL HISTORY:  Includes, tonsillectomy and lumpectomy for benign breast disease on the right, hysterectomy and LSO, hemorrhoidectomy, left knee surgery x2, and appendectomy.  MEDICATIONS:  She is on; 1. Metformin ER 500 mg twice daily. 2. Metoprolol 50 mg twice daily. 3. Tramadol 50 mg as needed for pain. 4. Meloxicam 7.5 mg as needed for pain. 5. Lisinopril/HCTZ 10/12.5 one tablet daily. 6.  Potassium gluconate 550 mg daily. 7. Chewable complex B vitamin daily. 8. Timolol eye drops for glaucoma, 1 drop each twice daily. 9. Latanoprost solution 1 drop both eyes at bedtime.  ALLERGIES:  Include, CODEINE, VINYL, PLASTIC GLUE, BAND-AIDS, RED RICE YEAST, CHOLESTEROL MEDICATIONS, SULFA MEDS.  OBSTETRICAL HISTORY:  She is G1, P1, with one cesarean delivery.  GYN HISTORY:  The patient had menarche at age 27.  She had a hysterectomy in 1986 for heavy bleeding and tumors.  She had LSO at that time.  She continues to have one right ovary that had some mild physiologic cyst although last time of scanning.  She has no history of abnormal Pap smear.  Her last mammogram was in 2011, was normal.  The last colonoscopy was in 2009, also normal.  FAMILY HISTORY:  Diabetes, heart disease, hypertension, breast cancer in her mother and her daughter, blood clots in her mother, fibromyalgia, arthritis, stroke, TIAs, and thyroid disease.  SOCIAL HISTORY:  The patient lives with her husband and her 38 year old granddaughter.  She denies tobacco or alcohol use.  She drinks one caffeinated beverage daily.  REVIEW OF SYSTEMS:  14-point review of system reviewed.  Please see GYN history in the  chart, diffusely positive secondary to the patient having fibromyalgia.  She complains of fatigue, muscle aches, weakness, dizzy spells, nausea, hot flashes.  PHYSICAL EXAM:  VITAL SIGNS:  Today, her vitals are as noted in the chart. GENERAL:  She is a well-developed, well-nourished female, in no acute distress. Abdomen:  Soft, nontender, nondistended. GU:  Normal.  External genitalia reveals white plaques that are coalesced over the entire perineum.  This involves mainly the areas just outside the labia minora laterally, superiorly, and inferiorly.  PROCEDURE:  Area of skin was infiltrated with 1 mL of lidocaine.  A 2-mm punch biopsy was obtained.  The patient tolerated the procedure well, that of  procedure was cauterized with silver nitrate.  IMPRESSION:  Probable lichen sclerosus.  PLAN: 1. Await biopsy results. 2. Start clobetasol, use nightly for 6 weeks, then 3 times a week for     2 weeks, and then make it to one to two times weekly.  The patient     is given instructions and hand on this diagnosis.  She will follow     up in 4-6 weeks to see how she response to therapy and to go over     biopsy results.          ______________________________ Tinnie Gens, MD    TP/MEDQ  D:  12/15/2010  T:  12/16/2010  Job:  [id number]

## 2010-12-25 NOTE — Assessment & Plan Note (Signed)
[facility]                                 ON-CALL NOTE   Cathy, Mullins                          MRN:          [id number]  DATE:11/08/2008                            DOB:          1946/04/28    She called from [phone].  States she was given Vicodin for pain and she  is allergic to CODEINE, and she has had Vicodin before and had a bad  reaction, ended up in the hospital.  The patient had another form of  oxycodone and that was too strong, so the hydrocodone has been called  in, but she is allergic to that and afraid to take it.  She does  normally take 1 Ultram every 6 hours, but that does not seem to be  helping the pain.  I recommended that she start to take 2 Ultram to see  if that will get her through the holiday weekend and then follow up with  Dr. Alphonsus Sias next week if that does not seem to be  controlling the pain.  Also I told the patient, emergency room was an option as she felt the  pain was severe enough and the Ultram was not helping.     Lelon Perla, DO  Electronically Signed    Shawnie Dapper  DD: 11/08/2008  DT: 11/09/2008  Job #: [id number]   cc:   Karie Schwalbe, MD

## 2010-12-25 NOTE — Assessment & Plan Note (Signed)
[facility]                           GASTROENTEROLOGY OFFICE NOTE   Cathy, Mullins                        MRN:          [id number]  DATE:03/30/2006                            DOB:          May 25, 1946    Cathy Mullins comes in on August 22.  Said she has been having left hip and back  pain. It has been going on for several months.  Primary care doctor says it  is not an orthopedic problem, thinks it is possibly GI and then referred her  back to me.  Since she has trouble walking, standing up, very intense at  times, and she was concerned about having a colon screen and having an MR of  her abdomen.   MEDICATIONS:  Metformin, fluoxetine, hydrochlorothiazide, Protonix 40 mg  daily, metoprolol, potassium, baby aspirin.   PAST MEDICAL HISTORY:  1. Known irritable bowel syndrome with history of colon polyps,      hematochezia.  2. Non insulin dependent diabetes.  3. History of TIA's.  4. Hypertension.  5. Chronic obstructive pulmonary disease.  6. Status post appendectomy and hysterectomy.   FAMILY HISTORY:  She has colon polyps in the past in family members.   PHYSICAL EXAMINATION:  VITAL SIGNS: Today she weighed 186.  Blood pressure  122/78, pulse 64 and regular.  GENERAL:  I asked her to get up on the table and she had pain with standing  up over her left ischial process.  OROPHARYNX:  Negative.  NECK:  Negative.  CHEST:  Clear.  HEART:  Revealed a regular rhythm.  There are no murmurs or bruits or rubs.  ABDOMEN:  Soft.  No masses or organomegaly.  There was only tenderness to  palpation over the ischial processes as I noted.  There did not appear to be  a herniation, a femoral hernia over the GI system.   IMPRESSION:  1. Non insulin dependent diabetes.  2. Gastroesophageal reflux disease.  3. Hip pain as described with questionable gastrointestinal relationship.  4. History of chronic obstructive pulmonary disease.  5. Hypertension.  6.  History of transient ischemia attack.  7. Status post appendectomy and hysterectomy.  8. History of fibromyalgia.  9. History of colon polyps.  10.Gastroesophageal reflux disease with gastritis as I noted.  11.Irritable bowel syndrome.   RECOMMENDATIONS:  Get a CT of her abdomen.  Schedule for colonoscopic  examination sometime in the near future,  but this is done as a follow up to  her other procedure which revealed cecal polyp and needs to be followed up.   COMMENT:  Dr. Prince Rome, I really think that this nice lady's pain is probably  not GI related and is more likely orthopedic process.  I told her that I  think that she should go ahead with the MR and I will get a CT to compliment  it.  Colonoscopic examination should be done but only as a follow up.  I  really do not think that this has any relationship to diverticulitis.  I do  appreciate you having me follow up our joint patient together.  Ulyess Mort, MD   SML/MedQ  DD:  03/30/2006  DT:  03/31/2006  Job #:  [id number]   cc:   Lillia Carmel, MD

## 2011-02-02 ENCOUNTER — Other Ambulatory Visit (INDEPENDENT_AMBULATORY_CARE_PROVIDER_SITE_OTHER): Payer: PRIVATE HEALTH INSURANCE | Admitting: Family Medicine

## 2011-02-02 ENCOUNTER — Telehealth: Payer: Self-pay | Admitting: Family Medicine

## 2011-02-02 ENCOUNTER — Ambulatory Visit (INDEPENDENT_AMBULATORY_CARE_PROVIDER_SITE_OTHER): Payer: PRIVATE HEALTH INSURANCE | Admitting: Family Medicine

## 2011-02-02 DIAGNOSIS — E7800 Pure hypercholesterolemia, unspecified: Secondary | ICD-10-CM

## 2011-02-02 DIAGNOSIS — E119 Type 2 diabetes mellitus without complications: Secondary | ICD-10-CM

## 2011-02-02 DIAGNOSIS — L940 Localized scleroderma [morphea]: Secondary | ICD-10-CM

## 2011-02-02 LAB — COMPREHENSIVE METABOLIC PANEL
ALT: 19 U/L (ref 0–35)
AST: 22 U/L (ref 0–37)
Alkaline Phosphatase: 55 U/L (ref 39–117)
Calcium: 9.8 mg/dL (ref 8.4–10.5)
Chloride: 102 mEq/L (ref 96–112)
Creatinine, Ser: 0.9 mg/dL (ref 0.4–1.2)
Potassium: 4.6 mEq/L (ref 3.5–5.1)

## 2011-02-02 LAB — HEMOGLOBIN A1C: Hgb A1c MFr Bld: 6.7 % — ABNORMAL HIGH (ref 4.6–6.5)

## 2011-02-02 LAB — LIPID PANEL
HDL: 59.6 mg/dL (ref >39.00)
Total CHOL/HDL Ratio: 4
Triglycerides: 170 mg/dL — ABNORMAL HIGH (ref 0.0–149.0)

## 2011-02-02 NOTE — Telephone Encounter (Signed)
Message copied by Excell Seltzer on Tue Feb 02, 2011  8:08 AM ------      Message from: Alvina Chou      Created: Mon Feb 01, 2011 10:20 AM       Patient is scheduled for CPX labs, please order future labs, Thanks , Cathy Mullins

## 2011-02-03 LAB — LDL CHOLESTEROL, DIRECT: Direct LDL: 152.3 mg/dL

## 2011-02-03 NOTE — Assessment & Plan Note (Signed)
NAME:  Cathy Mullins, Cathy Mullins NO.:  [id number]  MEDICAL RECORD NO.:  [id number]            PATIENT TYPE:  LOCATION:  [facility]           FACILITY:  PHYSICIAN:  Tinnie Gens, MD             DATE OF BIRTH:  DATE OF SERVICE:  02/02/2011                                 CLINIC NOTE  CHIEF COMPLAINT:  Followup.  HISTORY OF PRESENT ILLNESS:  The patient is a 65 year old gravida 1, para 1 who is status post hysterectomy in the past and LSO.  She came in approximately 6 weeks ago for a vulvar complaint.  At that time, she underwent a biopsy and was started on clobetasol for treatment of probable lichen sclerosus.  Her biopsy revealed lichen sclerosus as we suggested at that visit.  She has been on clobetasol. She is still using it 3 times a week.  She reports more normalization of tissue that seems better.  Her symptoms are greatly improved.  She is otherwise feeling well.  PHYSICAL EXAMINATION:  VITAL SIGNS:  On exam vitals are as noted in the chart. GENERAL:  She is a well-developed, well-nourished female in no acute distress.  IMPRESSION:  Lichen sclerosus, biopsy-proven improving on clobetasol.  PLAN:  Continues clobetasol and may wean down to twice and then to once weekly once everything seems like it is back to normal.  The patient we will return in 6 months for a followup visit.          ______________________________ Tinnie Gens, MD    TP/MEDQ  D:  02/02/2011  T:  02/03/2011  Job:  [id number]

## 2011-02-10 ENCOUNTER — Other Ambulatory Visit: Payer: Self-pay | Admitting: Family Medicine

## 2011-02-25 ENCOUNTER — Encounter: Payer: Self-pay | Admitting: Family Medicine

## 2011-03-01 ENCOUNTER — Ambulatory Visit (INDEPENDENT_AMBULATORY_CARE_PROVIDER_SITE_OTHER): Payer: PRIVATE HEALTH INSURANCE | Admitting: Family Medicine

## 2011-03-01 ENCOUNTER — Encounter: Payer: Self-pay | Admitting: Family Medicine

## 2011-03-01 DIAGNOSIS — IMO0001 Reserved for inherently not codable concepts without codable children: Secondary | ICD-10-CM

## 2011-03-01 DIAGNOSIS — L940 Localized scleroderma [morphea]: Secondary | ICD-10-CM

## 2011-03-01 DIAGNOSIS — E7800 Pure hypercholesterolemia, unspecified: Secondary | ICD-10-CM

## 2011-03-01 DIAGNOSIS — I10 Essential (primary) hypertension: Secondary | ICD-10-CM

## 2011-03-01 DIAGNOSIS — E119 Type 2 diabetes mellitus without complications: Secondary | ICD-10-CM

## 2011-03-01 DIAGNOSIS — M791 Myalgia, unspecified site: Secondary | ICD-10-CM

## 2011-03-01 DIAGNOSIS — N904 Leukoplakia of vulva: Secondary | ICD-10-CM

## 2011-03-01 MED ORDER — LISINOPRIL-HYDROCHLOROTHIAZIDE 10-12.5 MG PO TABS: 1.0000 | ORAL_TABLET | Freq: Every day | ORAL | Status: DC

## 2011-03-01 MED ORDER — METOPROLOL TARTRATE 50 MG PO TABS: 50.0000 mg | ORAL_TABLET | Freq: Two times a day (BID) | ORAL | Status: DC

## 2011-03-01 MED ORDER — MELOXICAM 7.5 MG PO TABS: 7.5000 mg | ORAL_TABLET | Freq: Every day | ORAL | Status: DC

## 2011-03-01 NOTE — Assessment & Plan Note (Signed)
Poor control, far form goal LDL ,100. Triglycerides better, but not at goal less than 150.  She is willing to try fish or flax seed oil and restart red yeast rice.  Recheck in 3 months.

## 2011-03-01 NOTE — Assessment & Plan Note (Addendum)
Stable tolerable control. Will reeval vit D oday.  Will check b12 also given some numbness in toes.

## 2011-03-01 NOTE — Progress Notes (Signed)
Subjective:    Patient ID: Cathy Mullins, female    DOB: 01-03-1946, 65 y.o.   MRN: [id number]  HPI  65 year old female presents for discussion of fibromyalgia.  She is overdue for follow up on DM, HTN and chol.  Fibromyalgia: Spending a lot of time on [email or web address]. She has been having  Weeks at a time where she cannot get out of bed with a flare.  Using tramadol 2 tabs every 6 hours. Occ using tylenol. On mobic at night. No stomach issues, on omeprazole as needed.  Having a good day today.  She is interested in having vit D.  Diabetes: well controlled on glucophage Lab Results  Component Value Date   HGBA1C 6.7* 02/02/2011  Using medications without difficulties: Hypoglycemic episodes:None Hyperglycemic episodes:None Feet problems: None Blood Sugars averaging: rarely checking... before lunch 105 eye exam within last year:Yes  Hypertension:  Well controlled on lisinopril/HCTZ, lopressor  Using medication without problems or lightheadedness:  Chest pain with exertion: None Edema: None Chipps of breath: Mild with deconditioning. Average home BPs: Not checking at home. Other issues:  Elevated Cholesterol: Poor control LDL on no mediciiton. Using medications without problems: Muscle aches:  Other complaints:     Review of Systems  Constitutional: Negative for fever and fatigue.  HENT: Negative for ear pain.   Eyes: Negative for pain.  Respiratory: Negative for chest tightness and shortness of breath.   Cardiovascular: Negative for chest pain, palpitations and leg swelling.  Gastrointestinal: Negative for abdominal pain.  Genitourinary: Negative for dysuria.     Some occ stinging in toes Bilaterally. Numbness in right leg.. ? Hip arthritis and hip/buttock pain.. Planning on seeing arthritis specialist for this.   Objective:   Physical Exam  Constitutional: Vital signs are normal. She appears well-developed and well-nourished. She is cooperative.  Non-toxic  appearance. She does not appear ill. No distress.  HENT:  Head: Normocephalic.  Right Ear: Hearing, tympanic membrane, external ear and ear canal normal. Tympanic membrane is not erythematous, not retracted and not bulging.  Left Ear: Hearing, tympanic membrane, external ear and ear canal normal. Tympanic membrane is not erythematous, not retracted and not bulging.  Nose: No mucosal edema or rhinorrhea. Right sinus exhibits no maxillary sinus tenderness and no frontal sinus tenderness. Left sinus exhibits no maxillary sinus tenderness and no frontal sinus tenderness.  Mouth/Throat: Uvula is midline, oropharynx is clear and moist and mucous membranes are normal.  Eyes: Conjunctivae, EOM and lids are normal. Pupils are equal, round, and reactive to light. No foreign bodies found.  Neck: Trachea normal and normal range of motion. Neck supple. Carotid bruit is not present. No mass and no thyromegaly present.  Cardiovascular: Normal rate, regular rhythm, S1 normal, S2 normal, normal heart sounds, intact distal pulses and normal pulses.  Exam reveals no gallop and no friction rub.   No murmur heard. Pulmonary/Chest: Effort normal and breath sounds normal. Not tachypneic. No respiratory distress. She has no decreased breath sounds. She has no wheezes. She has no rhonchi. She has no rales.  Abdominal: Soft. Normal appearance and bowel sounds are normal. There is no tenderness.  Neurological: She is alert.  Skin: Skin is warm, dry and intact. No rash noted.  Psychiatric: She has a normal mood and affect. Her speech is normal and behavior is normal. Judgment and thought content normal. Her mood appears not anxious. Cognition and memory are normal. She does not exhibit a depressed mood.  Assessment & Plan:

## 2011-03-01 NOTE — Assessment & Plan Note (Signed)
Well controlled. Continue current medications  

## 2011-03-01 NOTE — Patient Instructions (Addendum)
Start fish oil/flax seed oil 2000 mg divided daily. Restart red yeast rice 2400 mg daily. Return in 3 months for welcome to medicare and CPX with fasting labs.

## 2011-03-01 NOTE — Assessment & Plan Note (Signed)
Well controlled. Continue current medication. Encouraged exercise, weight loss, healthy eating habits.  

## 2011-04-26 ENCOUNTER — Ambulatory Visit: Payer: Self-pay | Admitting: Ophthalmology

## 2011-05-05 ENCOUNTER — Ambulatory Visit: Payer: Self-pay | Admitting: Ophthalmology

## 2011-05-31 ENCOUNTER — Other Ambulatory Visit (INDEPENDENT_AMBULATORY_CARE_PROVIDER_SITE_OTHER): Payer: MEDICARE

## 2011-05-31 DIAGNOSIS — E7800 Pure hypercholesterolemia, unspecified: Secondary | ICD-10-CM

## 2011-05-31 DIAGNOSIS — E119 Type 2 diabetes mellitus without complications: Secondary | ICD-10-CM

## 2011-05-31 LAB — COMPREHENSIVE METABOLIC PANEL
BUN: 21 mg/dL (ref 6–23)
CO2: 29 mEq/L (ref 19–32)
Calcium: 9.3 mg/dL (ref 8.4–10.5)
Chloride: 98 mEq/L (ref 96–112)
Creatinine, Ser: 1.1 mg/dL (ref 0.4–1.2)
GFR: 54.68 mL/min — ABNORMAL LOW (ref >60.00)
Glucose, Bld: 129 mg/dL — ABNORMAL HIGH (ref 70–99)
Total Bilirubin: 0.5 mg/dL (ref 0.3–1.2)

## 2011-05-31 LAB — LIPID PANEL
Cholesterol: 276 mg/dL — ABNORMAL HIGH (ref 0–200)
HDL: 57.7 mg/dL (ref >39.00)
Triglycerides: 311 mg/dL — ABNORMAL HIGH (ref 0.0–149.0)

## 2011-06-04 ENCOUNTER — Ambulatory Visit (INDEPENDENT_AMBULATORY_CARE_PROVIDER_SITE_OTHER): Payer: MEDICARE | Admitting: Family Medicine

## 2011-06-04 ENCOUNTER — Encounter: Payer: Self-pay | Admitting: Family Medicine

## 2011-06-04 VITALS — BP 118/80 | HR 61 | Temp 98.2°F | Ht 61.0 in | Wt 183.4 lb

## 2011-06-04 DIAGNOSIS — Z1231 Encounter for screening mammogram for malignant neoplasm of breast: Secondary | ICD-10-CM

## 2011-06-04 DIAGNOSIS — E7800 Pure hypercholesterolemia, unspecified: Secondary | ICD-10-CM

## 2011-06-04 DIAGNOSIS — J449 Chronic obstructive pulmonary disease, unspecified: Secondary | ICD-10-CM

## 2011-06-04 DIAGNOSIS — Z23 Encounter for immunization: Secondary | ICD-10-CM

## 2011-06-04 DIAGNOSIS — Z0000 Encounter for general adult medical examination without abnormal findings: Secondary | ICD-10-CM

## 2011-06-04 DIAGNOSIS — E119 Type 2 diabetes mellitus without complications: Secondary | ICD-10-CM

## 2011-06-04 DIAGNOSIS — I10 Essential (primary) hypertension: Secondary | ICD-10-CM

## 2011-06-04 DIAGNOSIS — R002 Palpitations: Secondary | ICD-10-CM

## 2011-06-04 DIAGNOSIS — IMO0001 Reserved for inherently not codable concepts without codable children: Secondary | ICD-10-CM

## 2011-06-04 DIAGNOSIS — Z780 Asymptomatic menopausal state: Secondary | ICD-10-CM

## 2011-06-04 LAB — HM DIABETES EYE EXAM

## 2011-06-04 NOTE — Assessment & Plan Note (Signed)
Well controlled. Continue current medication.  

## 2011-06-04 NOTE — Assessment & Plan Note (Addendum)
Poor control. She will consider welchol to treat. Start omega 3 fatty acids. Recheck in 6 months. Encouraged exercise, weight loss, healthy eating habits.

## 2011-06-04 NOTE — Assessment & Plan Note (Signed)
Stable

## 2011-06-04 NOTE — Patient Instructions (Addendum)
Start fish oil or flax seed oil 2000 mg divided daily. Work on [employer] and weight loss and healthy eating eating, low fat diet. Look into welchol or zetia to treat high LDL.Marland Kitchen Let me know if interested. Work on exercise for memory concerns. Let us know if worsening. Consider shingles vaccine... Let us know if you receive it. Stop by front desk to set up referrals.

## 2011-06-04 NOTE — Assessment & Plan Note (Signed)
Mild asymptomatic bradycardia on BBlocker.

## 2011-06-04 NOTE — Progress Notes (Signed)
Subjective:    Patient ID: Cathy Mullins, female    DOB: 1945/09/09, 65 y.o.   MRN: [id number]  HPI I have personally reviewed the Medicare Annual Wellness questionnaire and have noted 1. The patient's medical and social history 2. Their use of alcohol, tobacco or illicit drugs 3. Their current medications and supplements 4. The patient's functional ability including ADL's, fall risks, home safety risks and hearing or visual             impairment. 5. Diet and physical activities 6. Evidence for depression or mood disorders  The patients weight, height, BMI and visual acuity have been recorded in the chart I have made referrals, counseling and provided education to the patient based review of the above and I have provided the pt with a written personalized care plan for preventive services.  Hypertension:   At goal <130/80 on lisinopril/HCTZ, metoprolol (placed on BBLocker for tachycardia in past) Using medication without problems or lightheadedness: None Chest pain with exertion:None Edema:None [place] of breath:None Average home BPs: rarely checking  Other issues:  Elevated Cholesterol:On no medication. Poor control tri and LDL far from goal. Never tried flax seed oil, does not tolerated red yeast rice, SE to several statin meds...severe myalgia. Diet compliance: Moderate. Exercise:None, plans to join silver sneakers Other complaints: Hx of TIA.off ASA  Diabetes:  At goal on Glucophage. Lab Results  Component Value Date   HGBA1C 6.5 05/31/2011  Using medications without difficulties: None Hypoglycemic episodes:None Hyperglycemic episodes:None Feet problems:None Blood Sugars averaging:85-121 eye exam within last year:yes  Intermittant rash .. butterfly pattern over face intermittantly... Feels tired when ongoing. Last 1-2 weeks at a time. Goes away on its own. Most often occurs dutrin the day. No rash anywhere else. No family history of lupus in family. She has pictures  which she will bring it in. Last ANA 2009 negative.  Fibromyalgia:stable Using tramadol for pain prn.   Review of Systems  Constitutional: Negative for fever and fatigue.  HENT: Negative for ear pain.   Eyes: Negative for pain.  Respiratory: Negative for chest tightness and shortness of breath.   Cardiovascular: Negative for chest pain, palpitations and leg swelling.  Gastrointestinal: Negative for abdominal pain.  Genitourinary: Negative for dysuria.  Neurological:       Memory issues (cannot remember names) some difficulty word finding, concerned by alzheimer's in family  Psychiatric/Behavioral: Negative for suicidal ideas and dysphoric mood.       Objective:   Physical Exam  Constitutional: Vital signs are normal. She appears well-developed and well-nourished. She is cooperative.  Non-toxic appearance. She does not appear ill. No distress.  HENT:  Head: Normocephalic.  Right Ear: Hearing, tympanic membrane, external ear and ear canal normal.  Left Ear: Hearing, tympanic membrane, external ear and ear canal normal.  Nose: Nose normal.  Eyes: Conjunctivae, EOM and lids are normal. Pupils are equal, round, and reactive to light. No foreign bodies found.  Neck: Trachea normal and normal range of motion. Neck supple. Carotid bruit is not present. No mass and no thyromegaly present.  Cardiovascular: Normal rate, regular rhythm, S1 normal, S2 normal, normal heart sounds and intact distal pulses.  Exam reveals no gallop.   No murmur heard. Pulmonary/Chest: Effort normal and breath sounds normal. No respiratory distress. She has no wheezes. She has no rhonchi. She has no rales.  Abdominal: Soft. Normal appearance and bowel sounds are normal. She exhibits no distension, no fluid wave, no abdominal bruit and no mass. There is  no hepatosplenomegaly. There is no tenderness. There is no rebound, no guarding and no CVA tenderness. No hernia.  Genitourinary: No breast swelling, tenderness,  discharge or bleeding. Pelvic exam was performed with patient prone.  Lymphadenopathy:    She has no cervical adenopathy.    She has no axillary adenopathy.  Neurological: She is alert. She has normal strength. No cranial nerve deficit or sensory deficit.  Skin: Skin is warm, dry and intact. No rash noted.  Psychiatric: Her speech is normal and behavior is normal. Judgment normal. Her mood appears not anxious. Cognition and memory are normal. She does not exhibit a depressed mood.          Assessment & Plan:  Welcome to Medicare Physical:  The patient's preventative maintenance and recommended screening tests for an annual wellness exam were reviewed in full today. Brought up to date unless services declined.  Counselled on the importance of diet, exercise, and its role in overall health and mortality. The patient's FH and SH was reviewed, including their home life, tobacco status, and drug and alcohol status.   DXA: No previous on record. Will schedule. Mammo: nml 08/2010, mother with breast cancer EKG: nonspecific changes, bradycardia on BBlocker. PAP/DVE: not indicated s/p total abdominal hysterectomy for noncancerous reason.[place] Dr. Shawnie Pons for GYN for lichen sclerosis. Vaccines:Up to date with TDap and flu.. Due for PNA and consider shingles vaccine. Colon cancer screening: colonoscopy  Benign polyp 2003 , repeat in 10 years.

## 2011-06-28 ENCOUNTER — Other Ambulatory Visit: Payer: Self-pay | Admitting: *Deleted

## 2011-06-28 MED ORDER — GLUCOSE BLOOD VI STRP: ORAL_STRIP | Status: DC

## 2011-06-28 NOTE — Telephone Encounter (Signed)
Please assist  Dx 250.00 Check bs bid or more if symptomatic, #100, 6 refills

## 2011-06-28 NOTE — Telephone Encounter (Signed)
Pt is asking that a new script for one touch ultra test strips be sent to [facility] in [place].  Per medicare guidelines script must include dx code, number of times to check blood sugar daily and be for a total of 6 refills.

## 2011-07-07 ENCOUNTER — Ambulatory Visit: Payer: Self-pay | Admitting: Ophthalmology

## 2011-07-08 ENCOUNTER — Other Ambulatory Visit: Payer: Self-pay | Admitting: Family Medicine

## 2011-08-10 HISTORY — PX: EPIDURAL BLOCK INJECTION: SHX1516

## 2011-08-10 LAB — HM DEXA SCAN: HM Dexa Scan: NORMAL

## 2011-08-20 ENCOUNTER — Ambulatory Visit
Admission: RE | Admit: 2011-08-20 | Discharge: 2011-08-20 | Disposition: A | Discharge status: Home / Self Care | Payer: MEDICARE | Source: Ambulatory Visit | Attending: Family Medicine | Admitting: Family Medicine

## 2011-08-20 ENCOUNTER — Other Ambulatory Visit: Payer: Self-pay | Admitting: Family Medicine

## 2011-08-20 DIAGNOSIS — N63 Unspecified lump in unspecified breast: Secondary | ICD-10-CM

## 2011-08-20 DIAGNOSIS — Z780 Asymptomatic menopausal state: Secondary | ICD-10-CM

## 2011-08-20 DIAGNOSIS — Z1231 Encounter for screening mammogram for malignant neoplasm of breast: Secondary | ICD-10-CM

## 2011-08-24 ENCOUNTER — Encounter: Payer: Self-pay | Admitting: Family Medicine

## 2011-09-06 ENCOUNTER — Ambulatory Visit
Admission: RE | Admit: 2011-09-06 | Discharge: 2011-09-06 | Disposition: A | Discharge status: Home / Self Care | Payer: Medicare Other | Source: Ambulatory Visit | Attending: Family Medicine | Admitting: Family Medicine

## 2011-09-06 ENCOUNTER — Other Ambulatory Visit: Payer: Self-pay | Admitting: Family Medicine

## 2011-09-06 DIAGNOSIS — N63 Unspecified lump in unspecified breast: Secondary | ICD-10-CM

## 2011-09-07 ENCOUNTER — Encounter: Payer: Self-pay | Admitting: *Deleted

## 2011-09-10 ENCOUNTER — Telehealth: Payer: Self-pay | Admitting: Family Medicine

## 2011-09-10 DIAGNOSIS — E119 Type 2 diabetes mellitus without complications: Secondary | ICD-10-CM

## 2011-09-10 DIAGNOSIS — I10 Essential (primary) hypertension: Secondary | ICD-10-CM

## 2011-09-10 DIAGNOSIS — E7800 Pure hypercholesterolemia, unspecified: Secondary | ICD-10-CM

## 2011-09-10 NOTE — Telephone Encounter (Signed)
Message copied by Excell Seltzer on Fri Sep 10, 2011 12:23 PM ------      Message from: Alvina Chou      Created: Thu Sep 09, 2011 11:52 AM       Fasting labs, in April

## 2011-10-12 ENCOUNTER — Other Ambulatory Visit: Payer: Self-pay | Admitting: Family Medicine

## 2011-10-12 DIAGNOSIS — M545 Low back pain: Secondary | ICD-10-CM

## 2011-10-17 ENCOUNTER — Ambulatory Visit
Admission: RE | Admit: 2011-10-17 | Discharge: 2011-10-17 | Disposition: A | Discharge status: Home / Self Care | Payer: Medicare Other | Source: Ambulatory Visit | Attending: Family Medicine | Admitting: Family Medicine

## 2011-10-17 DIAGNOSIS — M545 Low back pain: Secondary | ICD-10-CM

## 2011-11-01 ENCOUNTER — Telehealth: Payer: Self-pay | Admitting: Family Medicine

## 2011-11-01 ENCOUNTER — Ambulatory Visit: Payer: Medicare Other | Admitting: Internal Medicine

## 2011-11-01 NOTE — Telephone Encounter (Signed)
Disregard message

## 2011-11-01 NOTE — Telephone Encounter (Signed)
[facility] Triage Call Report Triage Record Num: [id number] Operator: Terance Ice Patient Name: Cathy Mullins Call Date & Time: 11/01/2011 9:28:33AM Patient Phone: [phone] PCP: Kerby Nora Patient Gender: Female PCP Fax : [phone] Patient DOB: 1945-09-04 Practice Name: [facility] Reason for Call: Caller: Codee/Patient; PCP: Excell Seltzer.; CB#: [phone]; ; ; Call regarding Urinary Pain/Bleeding; onset appx 10 days but worsening with urge to urinate now q 1 hr; can urinate but with trickles without pain, color clear or yellow, very foul smell, denies fever, Guideline: Urinary Symptoms-Female; Disp: See provider within 24 hours; Appt? yes for 0900 on 11/02/11 with Dr. Dayton Martes. Protocol(s) Used: Urinary Symptoms - Female Recommended Outcome per Protocol: See Provider within 24 hours Reason for Outcome: Has one or more urinary tract symptoms AND has not been previously evaluated Care Advice: ~ Call provider if you develop flank or low back pain, fever, generally feel sick. Increase intake of fluids. Try to drink 8 oz. (.2 liter) every hour when awake, including unsweetened cranberry juice, unless on restricted fluids for other medical reasons. Take sips of fluid or eat ice chips if nauseated or vomiting. ~ ~ SYMPTOM / CONDITION MANAGEMENT ~ CAUTIONS Limit carbonated, alcoholic, and caffeinated beverages such as coffee, tea and soda. Avoid nonprescription cold and allergy medications that contain caffeine. Limit intake of tomatoes, fruit juices (except for unsweetened cranberry juice), dairy products, spicy foods, sugar, and artificial sweeteners (aspartame or saccharine). Stop or decrease smoking. Reducing exposure to bladder irritants may help lessen urgency. ~ ~ Call provider if urine is pink, red, smoky or cola colored. Systemic Inflammatory Response Syndrome (SIRS): Watch for signs of a generalized, whole body infection. Occurs within days of a localized  infection, especially of the urinary, GI, respiratory or nervous systems; or after a traumatic injury or invasive procedure. - Call EMS 911 if symptoms have worsened, such as increasing confusion or unusual drowsiness; cold and clammy skin; no urine output; rapid respiration (>30/min.) or slow respiration (<10/min.); struggling to breathe. - Go to the ED immediately for early symptoms of rapid pulse >90/min. or rapid breathing >20/min. at rest; chills; oral temperature >100.4 F (38 C) or <96.8 F (36 C) when associated with conditions noted. ~ 11/01/2011 10:29:43AM Page 1 of 1 CAN

## 2011-11-02 ENCOUNTER — Ambulatory Visit (INDEPENDENT_AMBULATORY_CARE_PROVIDER_SITE_OTHER): Payer: Medicare Other | Admitting: Family Medicine

## 2011-11-02 ENCOUNTER — Encounter: Payer: Self-pay | Admitting: Family Medicine

## 2011-11-02 VITALS — BP 124/80 | Temp 97.8°F | Wt 182.0 lb

## 2011-11-02 DIAGNOSIS — R350 Frequency of micturition: Secondary | ICD-10-CM

## 2011-11-02 LAB — POCT URINALYSIS DIPSTICK
Glucose, UA: NEGATIVE
Leukocytes, UA: NEGATIVE
Nitrite, UA: NEGATIVE
Urobilinogen, UA: NEGATIVE

## 2011-11-02 NOTE — Telephone Encounter (Signed)
Noted disregard for message.

## 2011-11-02 NOTE — Telephone Encounter (Signed)
Noted  

## 2011-11-02 NOTE — Progress Notes (Signed)
SUBJECTIVE: Cathy Mullins is a 66 y.o. female who complains of urinary frequency without urgency, dysuria, flank pain, fever, chills, or abnormal vaginal discharge or bleeding x 7 days.  She is on prednisone for her spinal stenosis and has had increased urinary frequency with this in the past.  Patient Active Problem List  Diagnoses  . DIABETES MELLITUS, TYPE II  . HYPERCHOLESTEROLEMIA  . INSOMNIA, CHRONIC  . GLAUCOMA NOS  . HYPERTENSION  . ALLERGIC RHINITIS  . COPD  . GERD  . OSTEOARTHRITIS, HANDS, BILATERAL  . FIBROMYALGIA  . Palpitations  . TRANSIENT ISCHEMIC ATTACK, HX OF  . COLONIC POLYPS, HX OF  . BACK PAIN  . Lichen sclerosus et atrophicus of the vulva   Past Medical History  Diagnosis Date  . Allergy   . History of colon polyps   . Asthma with COPD   . Diabetes mellitus   . GERD (gastroesophageal reflux disease)   . Esophagitis   . HTN (hypertension)   . TIA (transient ischemic attack)   . Fibromyalgia   . Tachycardia   . Glaucoma   . Perforating folliculitis    Past Surgical History  Procedure Date  . Abdominal hysterectomy   . Appendectomy   . Lumpectomy   . Breast surgery     right(benign)  . Knee arthroscopy w/ meniscal repair     left  . Lipoma excision     2 times left leg  . Carpal tunnel release   . Trigger finger release   . Tonsillectomy    History  Substance Use Topics  . Smoking status: Former Smoker    Quit date: 08/10/1987  . Smokeless tobacco: Not on file  . Alcohol Use: Yes   Family History  Problem Relation Age of Onset  . Cancer Mother     breast  . Stroke Mother   . Valvular heart disease Mother   . Kidney disease Father   . Stroke Father   . Coronary artery disease Father   . Diabetes Brother    Allergies  Allergen Reactions  . Codeine     REACTION: Gives her migraines  . Cosopt   . Hydrocodone-Acetaminophen   . ZOX:[id number] Acid+Aspartame   . Lovastatin     REACTION: myalgias  .  Sulfonamide Derivatives     REACTION: Rash   Current Outpatient Prescriptions on File Prior to Visit  Medication Sig Dispense Refill  . clobetasol (OLUX) 0.05 % topical foam Apply 1 application topically 2 (two) times daily.        Marland Kitchen desonide (DESOWEN) 0.05 % lotion Apply 1 application topically 2 (two) times daily.        Marland Kitchen glucose blood test strip 1 each by Other route as needed. Use as instructed       . glucose blood test strip Check Blood Sugar twice daily and more if symptomatic DX:250.0  100 each  6  . lisinopril-hydrochlorothiazide (PRINZIDE,ZESTORETIC) 10-12.5 MG per tablet Take 1 tablet by mouth daily.  90 tablet  3  . meloxicam (MOBIC) 7.5 MG tablet Take 1 tablet (7.5 mg total) by mouth daily.  90 tablet  3  . metFORMIN (GLUCOPHAGE-XR) 500 MG 24 hr tablet TAKE ONE TABLET BY MOUTH TWICE DAILY  180 tablet  2  . metoprolol (LOPRESSOR) 50 MG tablet Take 1 tablet (50 mg total) by mouth 2 (two) times daily.  180 tablet  3  . omeprazole (PRILOSEC) 20 MG capsule Take 20 mg by mouth daily.        Marland Kitchen  traMADol (ULTRAM) 50 MG tablet Take 50 mg by mouth every 8 (eight) hours as needed.         The PMH, PSH, Social History, Family History, Medications, and allergies have been reviewed in [facility], and have been updated if relevant.  OBJECTIVE:  BP 124/80  Temp(Src) 97.8 F (36.6 C) (Oral)  Wt 182 lb (82.555 kg)  Appears well, in no apparent distress.  Vital signs are normal. The abdomen is soft without tenderness, guarding, mass, rebound or organomegaly. No CVA tenderness or inguinal adenopathy noted. Urine dipstick negative for all components  Micro exam: not done.   ASSESSMENT/PLAN:   Urinary frequency- without evidence of infection. Advised keeping [place] posted with symptoms. We can recheck UA if symptoms persist.

## 2011-11-24 ENCOUNTER — Other Ambulatory Visit: Payer: Self-pay

## 2011-11-24 NOTE — Telephone Encounter (Signed)
Pt request Clobetasol propionate cream 0.05% for flare of folliculitis all over her body. This is not on med list but pt said she does not want clobetasol foam that is for another condition.Pt uses [facility] [place] and pt can be reached at [phone].

## 2011-11-26 NOTE — Telephone Encounter (Signed)
Please call pharmacy...who prescribed this last? This is not usually something I prescribe for folliculitis.

## 2011-11-29 NOTE — Telephone Encounter (Signed)
She gets this from a dermatologist. She says she hasnt used it in over 1 year. Patient says that she is almost beriden now because of spinal stenosis. Is this okay to refill ?

## 2011-11-29 NOTE — Telephone Encounter (Signed)
No. I will defer to Dr. B, but do not refill for folliculitis. If she has continued rash, then we can evaluate rash - this would not be typical for folliculitis.  Can await Dr. B input.

## 2011-11-30 NOTE — Telephone Encounter (Signed)
I agree...have her make appt for continued rash or have her call derm to have them see her or refill.

## 2011-12-01 NOTE — Telephone Encounter (Signed)
Patient advised.

## 2011-12-02 ENCOUNTER — Other Ambulatory Visit: Payer: MEDICARE

## 2011-12-09 ENCOUNTER — Ambulatory Visit: Payer: MEDICARE | Admitting: Family Medicine

## 2011-12-10 ENCOUNTER — Ambulatory Visit (INDEPENDENT_AMBULATORY_CARE_PROVIDER_SITE_OTHER): Payer: Medicare Other | Admitting: Family Medicine

## 2011-12-10 ENCOUNTER — Encounter: Payer: Self-pay | Admitting: Family Medicine

## 2011-12-10 VITALS — BP 130/80 | HR 73 | Temp 98.4°F | Ht 62.5 in | Wt 180.4 lb

## 2011-12-10 DIAGNOSIS — R1310 Dysphagia, unspecified: Secondary | ICD-10-CM | POA: Insufficient documentation

## 2011-12-10 DIAGNOSIS — M549 Dorsalgia, unspecified: Secondary | ICD-10-CM

## 2011-12-10 DIAGNOSIS — E119 Type 2 diabetes mellitus without complications: Secondary | ICD-10-CM

## 2011-12-10 DIAGNOSIS — I10 Essential (primary) hypertension: Secondary | ICD-10-CM

## 2011-12-10 DIAGNOSIS — IMO0001 Reserved for inherently not codable concepts without codable children: Secondary | ICD-10-CM

## 2011-12-10 DIAGNOSIS — E7800 Pure hypercholesterolemia, unspecified: Secondary | ICD-10-CM

## 2011-12-10 DIAGNOSIS — J449 Chronic obstructive pulmonary disease, unspecified: Secondary | ICD-10-CM

## 2011-12-10 NOTE — Assessment & Plan Note (Signed)
Likely well controlled as previously on glucophage. Due for re-eval.

## 2011-12-10 NOTE — Assessment & Plan Note (Signed)
Likely due to GERD, ? Stricture. Pt refuses PPI or H2 blocker. She will call for referral for ENDO if symptoms worsening, refuses referral at this time.

## 2011-12-10 NOTE — Assessment & Plan Note (Signed)
Recent spinal stenosis treated with epidural. Improved pain.

## 2011-12-10 NOTE — Assessment & Plan Note (Signed)
Well controlled. Continue current medication.  

## 2011-12-10 NOTE — Assessment & Plan Note (Signed)
Due for re-eval. Has not tolerated statins, red yeast rice, flax seed etc.

## 2011-12-10 NOTE — Patient Instructions (Addendum)
Return for fasting labs in next few weeks. Follow up in 6 month for Medicare subsquent wellness visit. Call if swallowing issue worsening and interested in further treatment.  Continue working on healthy diet and exercise as tolerated.

## 2011-12-10 NOTE — Assessment & Plan Note (Signed)
Stable on tramadol. 

## 2011-12-10 NOTE — Progress Notes (Signed)
Subjective:    Patient ID: Cathy Mullins, female    DOB: 1946/04/28, 66 y.o.   MRN: [id number]  HPI Hypertension: At goal <130/80 on lisinopril/HCTZ, metoprolol (placed on BBLocker for tachycardia in past)  Using medication without problems or lightheadedness: None  Chest pain with exertion:None  Edema:None  [place] of breath:None  Average home BPs: rarely checking  Other issues: occ indigestion  Elevated Cholesterol:On no medication. Poor control tri and LDL far from goal. Had constipaiton with flax seed oil,  does not tolerated red yeast rice, SE to several statin meds...severe myalgia.  Due for re-eval. Diet compliance: Moderate.  Exercise:None, plans to join silver sneakers  Other complaints: Hx of TIA.off ASA   Diabetes: Previously at goal on Glucophage.  Due for re-check. Using medications without difficulties: None  Hypoglycemic episodes:None  Hyperglycemic episodes:None  Feet problems:None  Blood Sugars averaging:90-140, no 200 measurements.  eye exam within last year:yes    Fibromyalgia:stable Using tramadol for pain prn.    urinary frequency: Saw Dr. Mervyn Skeeters in 10/2011 .. nml UA. Has improved now.  Dx with spinal stenosis 3/2013s: Dr. Jorge Mandril Had MRI. Had epidural 10 days ago, helped a lot.  Has difficulty swallowing foods. Takes tums daily. Last endo 2003 hiatal hernia.  Not interested in prilosec given effect on bones. Better  with moderating diet. Not interested in GI referral at this time.  She thinks that it may be Mitchells (erythromellagia) disease causing hot feeling in ears, face and nose. Rosy cheeks. I will look into this for her. Recommended fan on areas.    Review of Systems  Constitutional: Negative for fever and fatigue.  HENT: Negative for ear pain.   Eyes: Negative for pain.  Respiratory: Negative for chest tightness and shortness of breath.   Cardiovascular: Negative for chest pain, palpitations and leg swelling.  Gastrointestinal: Negative for  abdominal pain.       Occ indigestion if eats large meals  when she eats feels food gets stuck... All the time Last endo  Genitourinary: Negative for dysuria.       Objective:   Physical Exam  Constitutional: Vital signs are normal. She appears well-developed and well-nourished. She is cooperative.  Non-toxic appearance. She does not appear ill. No distress.  HENT:  Head: Normocephalic.  Right Ear: Hearing, tympanic membrane, external ear and ear canal normal. Tympanic membrane is not erythematous, not retracted and not bulging.  Left Ear: Hearing, tympanic membrane, external ear and ear canal normal. Tympanic membrane is not erythematous, not retracted and not bulging.  Nose: No mucosal edema or rhinorrhea. Right sinus exhibits no maxillary sinus tenderness and no frontal sinus tenderness. Left sinus exhibits no maxillary sinus tenderness and no frontal sinus tenderness.  Mouth/Throat: Uvula is midline, oropharynx is clear and moist and mucous membranes are normal.  Eyes: Conjunctivae, EOM and lids are normal. Pupils are equal, round, and reactive to light. No foreign bodies found.  Neck: Trachea normal and normal range of motion. Neck supple. Carotid bruit is not present. No mass and no thyromegaly present.  Cardiovascular: Normal rate, regular rhythm, S1 normal, S2 normal, normal heart sounds, intact distal pulses and normal pulses.  Exam reveals no gallop and no friction rub.   No murmur heard. Pulmonary/Chest: Effort normal and breath sounds normal. Not tachypneic. No respiratory distress. She has no decreased breath sounds. She has no wheezes. She has no rhonchi. She has no rales.  Abdominal: Soft. Normal appearance and bowel sounds are normal. There is no tenderness.  Neurological: She is alert.  Skin: Skin is warm, dry and intact. No rash noted.  Psychiatric: Her speech is normal and behavior is normal. Judgment and thought content normal. Her mood appears not anxious. Cognition and  memory are normal. She does not exhibit a depressed mood.          Assessment & Plan:

## 2012-03-01 ENCOUNTER — Other Ambulatory Visit: Payer: Self-pay | Admitting: Family Medicine

## 2012-03-21 ENCOUNTER — Ambulatory Visit (INDEPENDENT_AMBULATORY_CARE_PROVIDER_SITE_OTHER): Payer: Medicare Other | Admitting: Family Medicine

## 2012-03-21 ENCOUNTER — Encounter: Payer: Self-pay | Admitting: Family Medicine

## 2012-03-21 VITALS — BP 100/62 | HR 57 | Temp 98.0°F | Ht 62.5 in | Wt 176.5 lb

## 2012-03-21 DIAGNOSIS — E119 Type 2 diabetes mellitus without complications: Secondary | ICD-10-CM

## 2012-03-21 DIAGNOSIS — R002 Palpitations: Secondary | ICD-10-CM

## 2012-03-21 DIAGNOSIS — I10 Essential (primary) hypertension: Secondary | ICD-10-CM

## 2012-03-21 DIAGNOSIS — E7800 Pure hypercholesterolemia, unspecified: Secondary | ICD-10-CM

## 2012-03-21 LAB — COMPREHENSIVE METABOLIC PANEL
AST: 23 U/L (ref 0–37)
BUN: 23 mg/dL (ref 6–23)
Calcium: 9.4 mg/dL (ref 8.4–10.5)
Chloride: 95 mEq/L — ABNORMAL LOW (ref 96–112)
Creatinine, Ser: 1.1 mg/dL (ref 0.4–1.2)
GFR: 51.74 mL/min — ABNORMAL LOW (ref >60.00)

## 2012-03-21 LAB — CBC WITH DIFFERENTIAL/PLATELET
Basophils Relative: 1.1 % (ref 0.0–3.0)
Eosinophils Absolute: 0.7 10*3/uL (ref 0.0–0.7)
Eosinophils Relative: 7.9 % — ABNORMAL HIGH (ref 0.0–5.0)
Lymphocytes Relative: 34.5 % (ref 12.0–46.0)
MCHC: 33 g/dL (ref 30.0–36.0)
Neutrophils Relative %: 47.9 % (ref 43.0–77.0)
RBC: 4.07 Mil/uL (ref 3.87–5.11)
WBC: 8.6 10*3/uL (ref 4.5–10.5)

## 2012-03-21 LAB — LIPID PANEL
Cholesterol: 233 mg/dL — ABNORMAL HIGH (ref 0–200)
Total CHOL/HDL Ratio: 4
Triglycerides: 189 mg/dL — ABNORMAL HIGH (ref 0.0–149.0)

## 2012-03-21 NOTE — Assessment & Plan Note (Signed)
Well-controlled on current meds 

## 2012-03-21 NOTE — Progress Notes (Signed)
Subjective:    Patient ID: Cathy Mullins, female    DOB: 25-Oct-1945, 66 y.o.   MRN: [id number]  HPI  66 year old female with history of fibromyalgia, DM, remote TIAs in 2007 of uncertain etiology, mild obstructive sleep apnea, mitral valve prolapse, a history of baseline tachycardia who has been maintained on beta blockers who was seen last year  sinus tachycardia 01/2010 (saw Dr. Mariah Milling, ECHO was stable, never wore event montior) presents with "spells" of nausea, trembling shaking, heart feels like beating irregularly, shortness of breath, weakness. Gets headhache in back of head, neck stiffness.    Ongoing for 3 months occuring daily, lasting 1-3 hours.   Feels similar  to 2011 but worse compared to episodes last year, feels like she may "die".   When they occur she lies down, this helps some.  Has been eating milk and crackers, did drink apple juice and prune juice this morning.   Blood sugar has been running well.  She missed 12/2011 appt, never had those labs done.  Feels fine otherwise during other times of the day.  She denies any significant new stress, no new medications or herbal supplements.     Review of Systems  Constitutional: Positive for fatigue. Negative for fever.  HENT: Negative for congestion.   Eyes: Negative for pain.  Respiratory: Positive for shortness of breath. Negative for wheezing.   Cardiovascular: Positive for palpitations. Negative for chest pain and leg swelling.  Gastrointestinal: Negative for abdominal pain and blood in stool.  Genitourinary: Negative for hematuria.  Musculoskeletal: Positive for back pain.  Skin: Negative for rash.  Psychiatric/Behavioral: Negative for behavioral problems. The patient is not nervous/anxious.        Objective:   Physical Exam  Constitutional: Vital signs are normal. She appears well-developed and well-nourished. She is cooperative.  Non-toxic appearance. She does not appear ill. No distress.  HENT:  Head:  Normocephalic.  Right Ear: Hearing, tympanic membrane, external ear and ear canal normal. Tympanic membrane is not erythematous, not retracted and not bulging.  Left Ear: Hearing, tympanic membrane, external ear and ear canal normal. Tympanic membrane is not erythematous, not retracted and not bulging.  Nose: No mucosal edema or rhinorrhea. Right sinus exhibits no maxillary sinus tenderness and no frontal sinus tenderness. Left sinus exhibits no maxillary sinus tenderness and no frontal sinus tenderness.  Mouth/Throat: Uvula is midline, oropharynx is clear and moist and mucous membranes are normal.  Eyes: Conjunctivae, EOM and lids are normal. Pupils are equal, round, and reactive to light. No foreign bodies found.  Neck: Trachea normal and normal range of motion. Neck supple. Carotid bruit is not present. No mass and no thyromegaly present.  Cardiovascular: Normal rate, regular rhythm, S1 normal, S2 normal, normal heart sounds, intact distal pulses and normal pulses.  Exam reveals no gallop and no friction rub.   No murmur heard. Pulmonary/Chest: Effort normal and breath sounds normal. Not tachypneic. No respiratory distress. She has no decreased breath sounds. She has no wheezes. She has no rhonchi. She has no rales.  Abdominal: Soft. Normal appearance and bowel sounds are normal. There is no tenderness.  Neurological: She is alert.  Skin: Skin is warm, dry and intact. No rash noted.  Psychiatric: Her speech is normal and behavior is normal. Judgment and thought content normal. Her mood appears not anxious. Cognition and memory are normal. She does not exhibit a depressed mood.          Assessment & Plan:

## 2012-03-21 NOTE — Assessment & Plan Note (Signed)
Due fo re-eval.. Spells do not seem related to low CBGS.

## 2012-03-21 NOTE — Assessment & Plan Note (Addendum)
EKG today shows sinus bradycardia (she is on a bBlocker) but otherwise stable from 2011 and 2012.. If lab are normal in evaluation of these spells... Will refer back to Dr. Mariah Milling for her to wear and event montior.

## 2012-03-21 NOTE — Addendum Note (Signed)
Addended by: Alvina Chou on: 03/21/2012 09:46 AM   Modules accepted: Orders

## 2012-03-21 NOTE — Patient Instructions (Addendum)
We will call with lab results. If symptoms become more severe or chest pain.. Go to ER.

## 2012-03-22 LAB — LDL CHOLESTEROL, DIRECT: Direct LDL: 155.7 mg/dL

## 2012-03-23 ENCOUNTER — Other Ambulatory Visit: Payer: Self-pay | Admitting: Family Medicine

## 2012-03-23 ENCOUNTER — Telehealth: Payer: Self-pay | Admitting: Family Medicine

## 2012-03-23 DIAGNOSIS — E871 Hypo-osmolality and hyponatremia: Secondary | ICD-10-CM

## 2012-03-23 DIAGNOSIS — R002 Palpitations: Secondary | ICD-10-CM

## 2012-03-23 MED ORDER — LISINOPRIL 10 MG PO TABS: 10.0000 mg | ORAL_TABLET | Freq: Every day | ORAL | Status: DC

## 2012-03-23 NOTE — Telephone Encounter (Signed)
Notify pt she has mild anemia.. Would not explain the palpitations. Sodium is very low.. Could be causing the way she feels. Could be caused by the HCTZ component of her BP med. Would start by changing this to lisinopril alone. Sent in rx. Return for lab recheck in 1 week. If not better we will need to look into other causes of low sodium. For now have her make sure not to drink excessive amounts of water. We still may need her to return to cardiology for an event montior but if she is agreeable... Will wait to see if she feels better once sodium corrected.  Nml thyroid, cholesterol is too high LDL above goal 130 and triglycerides are too high. DM very well controlled.

## 2012-03-24 NOTE — Addendum Note (Signed)
Addended by: Kerby Nora E on: 03/24/2012 10:37 AM   Modules accepted: Orders

## 2012-03-24 NOTE — Telephone Encounter (Signed)
Advised patient.  She says her mother has a very rare heart condition that has put her in a few comas.  One of the symptoms of the condition is very low sodium.  Pt wants you to be aware of this, some type of thickened valve condition.  She doesn't have any other information.

## 2012-03-24 NOTE — Telephone Encounter (Signed)
Will go ahead and refer to cardiology for further eval of arrythmia given mother with history of unknown heart disease.

## 2012-03-30 ENCOUNTER — Other Ambulatory Visit (INDEPENDENT_AMBULATORY_CARE_PROVIDER_SITE_OTHER): Payer: Medicare Other

## 2012-03-30 DIAGNOSIS — E871 Hypo-osmolality and hyponatremia: Secondary | ICD-10-CM

## 2012-03-30 LAB — BASIC METABOLIC PANEL
GFR: 59.66 mL/min — ABNORMAL LOW (ref >60.00)
Glucose, Bld: 108 mg/dL — ABNORMAL HIGH (ref 70–99)
Potassium: 4.6 mEq/L (ref 3.5–5.1)
Sodium: 135 mEq/L (ref 135–145)

## 2012-03-31 ENCOUNTER — Other Ambulatory Visit: Payer: Self-pay | Admitting: Family Medicine

## 2012-04-03 ENCOUNTER — Ambulatory Visit: Payer: Medicare Other | Admitting: Cardiovascular Disease

## 2012-04-17 ENCOUNTER — Telehealth: Payer: Self-pay

## 2012-04-17 ENCOUNTER — Ambulatory Visit: Payer: Medicare Other | Admitting: Cardiovascular Disease

## 2012-04-17 MED ORDER — ALBUTEROL SULFATE HFA 108 (90 BASE) MCG/ACT IN AERS: INHALATION_SPRAY | RESPIRATORY_TRACT | Status: DC

## 2012-04-17 NOTE — Telephone Encounter (Signed)
Pt request refill albuterol inhaler 90 mcg instructions 2 puffs q6h prn. Will not let me add to pts med list from hx med list; notice needs new order. [facility] [place]. Pt does not want to come in for appt. Pt said she does not have infection but last week was around cigarette smoke; now has wheezing and SOB. Pt also having fast heart rate at times but is making appt with Dr Mariah Milling for that. Please advise.

## 2012-04-17 NOTE — Telephone Encounter (Signed)
Inhaler sent to [facility].  Left message advising patient and asked that she call tomorrow with update.

## 2012-04-17 NOTE — Telephone Encounter (Signed)
Can you call and check and make sure this patient ok?  Ok to refill albuterol 2 puffs, q 4 hours prn wheezing. #1, 0 refills

## 2012-05-05 ENCOUNTER — Encounter: Payer: Self-pay | Admitting: Cardiovascular Disease

## 2012-05-05 ENCOUNTER — Ambulatory Visit (INDEPENDENT_AMBULATORY_CARE_PROVIDER_SITE_OTHER): Payer: Medicare Other | Admitting: Cardiovascular Disease

## 2012-05-05 VITALS — BP 140/72 | HR 61 | Ht 62.0 in | Wt 176.5 lb

## 2012-05-05 DIAGNOSIS — R5381 Other malaise: Secondary | ICD-10-CM

## 2012-05-05 DIAGNOSIS — E7800 Pure hypercholesterolemia, unspecified: Secondary | ICD-10-CM

## 2012-05-05 DIAGNOSIS — R5383 Other fatigue: Secondary | ICD-10-CM

## 2012-05-05 DIAGNOSIS — R0602 Shortness of breath: Secondary | ICD-10-CM

## 2012-05-05 DIAGNOSIS — R002 Palpitations: Secondary | ICD-10-CM

## 2012-05-05 DIAGNOSIS — E871 Hypo-osmolality and hyponatremia: Secondary | ICD-10-CM

## 2012-05-05 DIAGNOSIS — E119 Type 2 diabetes mellitus without complications: Secondary | ICD-10-CM

## 2012-05-05 DIAGNOSIS — R000 Tachycardia, unspecified: Secondary | ICD-10-CM

## 2012-05-05 DIAGNOSIS — R531 Weakness: Secondary | ICD-10-CM

## 2012-05-05 MED ORDER — PROPRANOLOL HCL 20 MG PO TABS: 20.0000 mg | ORAL_TABLET | Freq: Three times a day (TID) | ORAL | Status: DC | PRN

## 2012-05-05 NOTE — Assessment & Plan Note (Signed)
We have encouraged continued exercise, careful diet management in an effort to lose weight. 

## 2012-05-05 NOTE — Patient Instructions (Addendum)
Please take propranolol as needed for episodes of tachycardia Please call the office and come in for EKG when you have symptoms  Please call if you would like the office to call an event monitor  Please research these meds for cholesterol: Welchol, zetia, fenofibrate  Please call us if you have new issues that need to be addressed before your next appt.

## 2012-05-05 NOTE — Assessment & Plan Note (Signed)
Sodium has improved without her diuretic. Uncertain if this was coincidental but she believes since it has improved, her episodes of palpitations have improved.

## 2012-05-05 NOTE — Progress Notes (Signed)
Patient ID: Cathy Mullins, female    DOB: 1946-03-22, 66 y.o.   MRN: [id number]  HPI Comments: [name] is a 66 year-old woman with a history of fibromyalgia, remote TIAs in 2007 of uncertain etiology, mild obstructive sleep apnea, mitral valve prolapse, a history of baseline tachycardia who has been maintained on beta blockers, history of night terrors, with  episodes of tachycardia dating back several years, initially evaluated in 2011 with normal echocardiogram at that time, no monitor completed.  She presents again today with recurrent symptoms of tachycardia and palpitations.  She reports that her symptoms have been getting worse and she attributes them to low sodium. Symptoms seem to have improved somewhat as her sodium has improved without the diuretic. She has had 2 episodes of palpitations/tachycardia over the past month. One episode was [place]. Prior to recently, symptoms would come on without provocation, last from one to 3 hours at a time. She would lie on her bed and wait for them to resolve. She would feel weak, trembly until it resolved. She does report her last episode was over one week ago.   She has numerous other complaints including hot flashes on different parts of her body, spinal stenosis and lower sternal discomfort, fibromyalgia. She's unable to tolerate cholesterol medication/statins.     She denies any significant new stress, no new medications or herbal supplements.  She stopped smoking 20 years ago.   EKG shows normal sinus rhythm with rate 61 beats per minute, no significant ST or T wave changes.       Outpatient Encounter Prescriptions as of 05/05/2012  Medication Sig Dispense Refill  . albuterol (PROVENTIL HFA;VENTOLIN HFA) 108 (90 BASE) MCG/ACT inhaler 2 puffs every 4 hours as needed for wheezing.  1 Inhaler  0  . clobetasol (OLUX) 0.05 % topical foam Apply 1 application topically 2 (two) times daily.       . clobetasol cream  (TEMOVATE) 0.05 % Apply 1 application topically 2 (two) times daily.      . diclofenac sodium (VOLTAREN) 1 % GEL Apply 1 application topically 4 (four) times daily.      . diphenhydrAMINE (BENADRYL) 25 MG tablet Take 25 mg by mouth at bedtime as needed.      Marland Kitchen glucose blood test strip 1 each by Other route as needed. Use as instructed       . glucose blood test strip Check Blood Sugar twice daily and more if symptomatic DX:250.0  100 each  6  . latanoprost (XALATAN) 0.005 % ophthalmic solution Place 1 drop into both eyes at bedtime.      Marland Kitchen lisinopril (PRINIVIL,ZESTRIL) 10 MG tablet Take 1 tablet (10 mg total) by mouth daily.  30 tablet  11  . meloxicam (MOBIC) 7.5 MG tablet Take 1 tablet (7.5 mg total) by mouth daily.  90 tablet  3  . metFORMIN (GLUCOPHAGE-XR) 500 MG 24 hr tablet TAKE ONE TABLET BY MOUTH TWICE DAILY  60 tablet  11  . metoprolol (LOPRESSOR) 50 MG tablet TAKE ONE TABLET BY MOUTH TWICE DAILY  180 tablet  3  . traMADol (ULTRAM) 50 MG tablet Take 50 mg by mouth every 8 (eight) hours as needed.        Marland Kitchen DISCONTD: lisinopril-hydrochlorothiazide (PRINZIDE,ZESTORETIC) 10-12.5 MG per tablet Take 1 tablet by mouth 2 (two) times daily before a meal.      . DISCONTD: tiZANidine (ZANAFLEX) 4 MG capsule Take 4 mg by mouth 3 (  three) times daily.        Review of Systems  Constitutional: Positive for fatigue.  HENT: Negative.   Eyes: Negative.   Respiratory: Negative.   Cardiovascular: Positive for palpitations.  Gastrointestinal: Negative.   Musculoskeletal: Positive for myalgias and arthralgias.  Skin: Negative.   Neurological: Positive for weakness.  Hematological: Negative.   Psychiatric/Behavioral: Negative.   All other systems reviewed and are negative.    BP 140/72  Pulse 61  Ht 5\' 2"  (1.575 m)  Wt 176 lb 8 oz (80.06 kg)  BMI 32.28 kg/m2  Physical Exam  Nursing note and vitals reviewed. Constitutional: She is oriented to person, place, and time. She appears  well-developed and well-nourished.  HENT:  Head: Normocephalic.  Nose: Nose normal.  Mouth/Throat: Oropharynx is clear and moist.  Eyes: Conjunctivae normal are normal. Pupils are equal, round, and reactive to light.  Neck: Normal range of motion. Neck supple. No JVD present.  Cardiovascular: Normal rate, regular rhythm, S1 normal, S2 normal and intact distal pulses.  Exam reveals no gallop and no friction rub.   Murmur heard.  Crescendo systolic murmur is present with a grade of 2/6  Pulmonary/Chest: Effort normal and breath sounds normal. No respiratory distress. She has no wheezes. She has no rales. She exhibits no tenderness.  Abdominal: Soft. Bowel sounds are normal. She exhibits no distension. There is no tenderness.  Musculoskeletal: Normal range of motion. She exhibits no edema and no tenderness.  Lymphadenopathy:    She has no cervical adenopathy.  Neurological: She is alert and oriented to person, place, and time. Coordination normal.  Skin: Skin is warm and dry. No rash noted. No erythema.  Psychiatric: She has a normal mood and affect. Her behavior is normal. Judgment and thought content normal.         Assessment and Plan

## 2012-05-05 NOTE — Assessment & Plan Note (Signed)
She does not want a statin. We have asked her to research the price of WelChol, fenofibrate, and Zetia.

## 2012-05-05 NOTE — Assessment & Plan Note (Signed)
Etiology of her palpitations and tachycardia episodes is uncertain. We have again offered a 30 day monitor. We have also offered she come to the office when she has episodes for an EKG. We have provided her with propranolol to take as needed for episodes in addition to her daily metoprolol. She states that she would like to try to watch her symptoms for now as they have improved with her sodium improving. She has less episodes. She will come to the office when she has episodes. If she is unable to come to the office for symptoms at nighttime, she will call her office for [place] to order a 30 day monitor. We have suggested she try the propranolol when necessary. Other option would be to try diltiazem 30 mg when necessary for symptom relief. Unable to exclude panic attacks or anxiety.

## 2012-05-13 LAB — HM DIABETES EYE EXAM

## 2012-06-13 ENCOUNTER — Ambulatory Visit (INDEPENDENT_AMBULATORY_CARE_PROVIDER_SITE_OTHER): Payer: Medicare Other | Admitting: Family Medicine

## 2012-06-13 ENCOUNTER — Encounter: Payer: Self-pay | Admitting: Family Medicine

## 2012-06-13 VITALS — BP 130/70 | HR 68 | Temp 98.8°F | Ht 61.5 in | Wt 174.8 lb

## 2012-06-13 DIAGNOSIS — IMO0001 Reserved for inherently not codable concepts without codable children: Secondary | ICD-10-CM

## 2012-06-13 DIAGNOSIS — Z1211 Encounter for screening for malignant neoplasm of colon: Secondary | ICD-10-CM

## 2012-06-13 DIAGNOSIS — Z23 Encounter for immunization: Secondary | ICD-10-CM

## 2012-06-13 DIAGNOSIS — E119 Type 2 diabetes mellitus without complications: Secondary | ICD-10-CM

## 2012-06-13 DIAGNOSIS — E7800 Pure hypercholesterolemia, unspecified: Secondary | ICD-10-CM

## 2012-06-13 DIAGNOSIS — I10 Essential (primary) hypertension: Secondary | ICD-10-CM

## 2012-06-13 DIAGNOSIS — E871 Hypo-osmolality and hyponatremia: Secondary | ICD-10-CM

## 2012-06-13 DIAGNOSIS — Z0000 Encounter for general adult medical examination without abnormal findings: Secondary | ICD-10-CM

## 2012-06-13 LAB — HM DIABETES FOOT EXAM

## 2012-06-13 MED ORDER — COLESEVELAM HCL 625 MG PO TABS: 1875.0000 mg | ORAL_TABLET | Freq: Two times a day (BID) | ORAL | Status: DC

## 2012-06-13 NOTE — Progress Notes (Signed)
Subjective:    Patient ID: Cathy Mullins, female    DOB: Mar 06, 1946, 66 y.o.   MRN: [id number]  HPI I have personally reviewed the Medicare Annual Wellness questionnaire and have noted  1. The patient's medical and social history  2. Their use of alcohol, tobacco or illicit drugs  3. Their current medications and supplements  4. The patient's functional ability including ADL's, fall risks, home safety risks and hearing or visual  impairment.  5. Diet and physical activities  6. Evidence for depression or mood disorders  The patients weight, height, BMI and visual acuity have been recorded in the chart  I have made referrals, counseling and provided education to the patient based review of the above and I have provided the pt with a written personalized care plan for preventive services.   Hypertension: At goal <130/80 on lisinopril, metoprolol, propranolol prn. Using medication without problems or lightheadedness: see below  Chest pain with exertion:None  Edema:None  [place] of breath:see below Average home BPs: 120/70s Other issues:   Elevated Cholesterol: Lab Results  Component Value Date   CHOL 233* 03/21/2012   HDL 58.50 03/21/2012   LDLCALC See Comment mg/dL 40/04/8118   LDLDIRECT 147.8 03/21/2012   TRIG 189.0* 03/21/2012   CHOLHDL 4 03/21/2012  On no medication. Poor control tri and LDL far from goal. Never tried flax seed oil, does not tolerated red yeast rice, SE to several statin meds...severe myalgia.  Diet compliance: Moderate.  Exercise:None, plans to join silver sneakers  Other complaints: Hx of TIA.off ASA   Diabetes: At goal on Glucophage.  Lab Results  Component Value Date   HGBA1C 6.1 03/21/2012  Using medications without difficulties: None  Hypoglycemic episodes:None  Hyperglycemic episodes:None  Feet problems:None  Blood Sugars averaging:100-120 eye exam within last year:yes   Saw Dr. Mariah Milling for episodes in 03/2012 He stated: Etiology of her palpitations and  tachycardia episodes is uncertain. We have again offered a 30 day monitor. We have also offered she come to the office when she has episodes for an EKG. We have provided her with propranolol to take as needed for episodes in addition to her daily metoprolol. She states that she would like to try to watch her symptoms for now as they have improved with her sodium improving. She has less episodes. She will come to the office when she has episodes. If she is unable to come to the office for symptoms at nighttime, she will call her office for [place] to order a 30 day monitor. We have suggested she try the propranolol when necessary. Other option would be to try diltiazem 30 mg when necessary for symptom relief. Unable to exclude panic attacks or anxiety.  She has had 3-4 episodes since, but much less and not as frequent since sodium improved.      Review of Systems  Constitutional: Negative for fever, fatigue and unexpected weight change.  HENT: Negative for ear pain, congestion, sore throat, sneezing, trouble swallowing and sinus pressure.   Eyes: Negative for pain and itching.  Respiratory: Negative for cough, shortness of breath and wheezing.   Cardiovascular: Negative for chest pain, palpitations and leg swelling.  Gastrointestinal: Negative for nausea, abdominal pain, diarrhea, constipation and blood in stool.  Genitourinary: Negative for dysuria, hematuria, vaginal discharge, difficulty urinating and menstrual problem.  Skin: Negative for rash.  Neurological: Negative for syncope, weakness, light-headedness, numbness and headaches.  Psychiatric/Behavioral: Negative for confusion and dysphoric mood. The patient is not nervous/anxious.  Mole on right arm dull  Appearing , mole under left breast is itchy.    Objective:   Physical Exam  Constitutional: Vital signs are normal. She appears well-developed and well-nourished. She is cooperative.  Non-toxic appearance. She does not appear ill. No  distress.  HENT:  Head: Normocephalic.  Right Ear: Hearing, tympanic membrane, external ear and ear canal normal.  Left Ear: Hearing, tympanic membrane, external ear and ear canal normal.  Nose: Nose normal.  Eyes: Conjunctivae normal, EOM and lids are normal. Pupils are equal, round, and reactive to light. No foreign bodies found.  Neck: Trachea normal and normal range of motion. Neck supple. Carotid bruit is not present. No mass and no thyromegaly present.  Cardiovascular: Normal rate, regular rhythm, S1 normal, S2 normal, normal heart sounds and intact distal pulses.  Exam reveals no gallop.   No murmur heard. Pulmonary/Chest: Effort normal and breath sounds normal. No respiratory distress. She has no wheezes. She has no rhonchi. She has no rales.  Abdominal: Soft. Normal appearance and bowel sounds are normal. She exhibits no distension, no fluid wave, no abdominal bruit and no mass. There is no hepatosplenomegaly. There is no tenderness. There is no rebound, no guarding and no CVA tenderness. No hernia.  Genitourinary: No breast swelling, tenderness, discharge or bleeding. Pelvic exam was performed with patient supine.  Lymphadenopathy:    She has no cervical adenopathy.    She has no axillary adenopathy.  Neurological: She is alert. She has normal strength. No cranial nerve deficit or sensory deficit.  Skin: Skin is warm, dry and intact. No rash noted.       Benign appearing skin lesion on left forearm, skin tags under arms and breasts  Psychiatric: Her speech is normal and behavior is normal. Judgment normal. Her mood appears not anxious. Cognition and memory are normal. She does not exhibit a depressed mood.          Assessment & Plan:  1 st annual Medicare Physical:  The patient's preventative maintenance and recommended screening tests for an annual wellness exam were reviewed in full today.  Brought up to date unless services declined.  Counselled on the importance of diet,  exercise, and its role in overall health and mortality.  The patient's FH and SH was reviewed, including their home life, tobacco status, and drug and alcohol status.   DXA: 05/2011 nml Mammo: nml 08/2011, mother with breast cancer  PAP/DVE: not indicated s/p  abdominal hysterectomy, right ovary remaining for noncancerous reason.[place] [facility] Dr. Shawnie Pons for GYN for lichen sclerosis.  Vaccines:Up to date with TDap and PNA and consider shingles vaccine.  Flu today. Colon cancer screening: colonoscopy Benign polyp 2003 , repeat in 10 years. Due this year.   Wt Readings from Last 3 Encounters:  06/13/12 174 lb 12 oz (79.266 kg)  05/05/12 176 lb 8 oz (80.06 kg)  03/21/12 176 lb 8 oz (80.06 kg)

## 2012-06-13 NOTE — Assessment & Plan Note (Signed)
Stable control. 

## 2012-06-13 NOTE — Assessment & Plan Note (Signed)
Resolved off HCTZ 

## 2012-06-13 NOTE — Patient Instructions (Addendum)
We will start welchol, but call if to pricy.  Return to have fasting labs in 3 months.  Get shingles vaccine at pharmacy.  Stop at lab on the way out to get the stool test.

## 2012-06-13 NOTE — Assessment & Plan Note (Signed)
Well controlled at previous check.

## 2012-06-13 NOTE — Assessment & Plan Note (Signed)
Well controlled. Continue current medication.  

## 2012-06-13 NOTE — Assessment & Plan Note (Signed)
Poor control. Goal LDL <70 given TIA hx. Start welchol. Recheck in 3 months.

## 2012-06-27 ENCOUNTER — Other Ambulatory Visit (INDEPENDENT_AMBULATORY_CARE_PROVIDER_SITE_OTHER): Payer: Medicare Other

## 2012-06-27 DIAGNOSIS — Z1211 Encounter for screening for malignant neoplasm of colon: Secondary | ICD-10-CM

## 2012-06-27 LAB — FECAL OCCULT BLOOD, IMMUNOCHEMICAL: Fecal Occult Bld: NEGATIVE

## 2012-06-28 ENCOUNTER — Encounter: Payer: Self-pay | Admitting: *Deleted

## 2012-11-13 ENCOUNTER — Telehealth: Payer: Self-pay | Admitting: Family Medicine

## 2012-11-13 MED ORDER — PROMETHAZINE HCL 25 MG PO TABS: 25.0000 mg | ORAL_TABLET | Freq: Four times a day (QID) | ORAL | Status: DC | PRN

## 2012-11-13 NOTE — Telephone Encounter (Signed)
rx sent in and patient advised

## 2012-11-13 NOTE — Telephone Encounter (Signed)
Phenergan 25 mg, 1/2 - 1 tab po q 6 hours prn nausea, #30, 0 ref

## 2012-11-13 NOTE — Telephone Encounter (Signed)
Patient Information:  Caller Name: Raniyah  Phone: [phone]  Patient: Cathy Mullins  Gender: Female  DOB: 11-16-1945  Age: 67 Years  PCP: Kerby Nora (Family Practice)  Office Follow Up:  Does the office need to follow up with this patient?: Yes  Instructions For The Office: OFFICE PLEASE SEE IF RX OF PHENERGAN CAN BE CALLED IN FOR PATIENT.  PT USES [facility] IN [place] [address]   Symptoms  Reason For Call & Symptoms: headache and nausea/vomiting.  Pt states she was able to eat breakfast this am and keep it down.  Pt states her head  pain is easing off but rates her head pain as 7/10. Pt states she has vomited 5 x in 24 hours x 3 days  Reviewed Health History In EMR: Yes  Reviewed Medications In EMR: Yes  Reviewed Allergies In EMR: Yes  Reviewed Surgeries / Procedures: Yes  Date of Onset of Symptoms: 11/10/2012  Treatments Tried: 1/2 of a caffeine pill of spouse  Treatments Tried Worked: No  Guideline(s) Used:  Headache  Disposition Per Guideline:   See Today or Tomorrow in Office  Reason For Disposition Reached:   Unexplained headache that is present > 24 hours  Advice Given:  Rest:   Lie down in a dark, quiet place and try to relax. Close your eyes and imagine your entire body relaxing.  Apply Cold to the Area:   Apply a cold wet washcloth or cold pack to the forehead for 20 minutes.  Stretching:   Stretch and massage any tight neck muscles.  Call Back If:  You become worse.  Patient Refused Recommendation:  Patient Requests Prescription  RN offered pt appt for today but pt declined.  Pt states she is too sick to drive and her husband is sick and cannot drive her.  Pt is requesting a RX of Phenergan suppositories to help with her nausea and vomiting.

## 2012-12-19 ENCOUNTER — Telehealth: Payer: Self-pay | Admitting: Family Medicine

## 2012-12-19 NOTE — Telephone Encounter (Signed)
Patient advised and will call back if not improving

## 2012-12-19 NOTE — Telephone Encounter (Addendum)
Have her start prilosec OTC 2 x 20 mg tabs daily.  Have her hold meloxicma as it could be causing stomach irritation.  If still not improving... May need to hold tramadol.

## 2012-12-19 NOTE — Telephone Encounter (Signed)
Patient Information:  Caller Name: Chayah  Phone: [phone]  Patient: Cathy Mullins  Gender: Female  DOB: 1945/11/13  Age: 67 Years  PCP: Kerby Nora (Family Practice)  Office Follow Up:  Does the office need to follow up with this patient?: Yes  Instructions For The Office: Her husband is ill and confused. She is under stress and cannot make it to the office this week due to his appt.  Reviewed home care and care advise. Patient would like to schedule Next Thursday with physician.  PLEASE CALL FOR SCHEDULING...AND WHAT CAN SHE TAKE OR BE GIVEN IN THE MEANTIME FOR DISCOMFORT  RN Note:  Her husband is ill and confused. She is under stress and cannot make it to the office this week due to his appt.  Reviewed home care and care advise. Patient would like to schedule Next Thursday with physician.  PLEASE CALL FOR SCHEDULING...AND WHAT CAN SHE TAKE OR BE GIVEN IN THE MEANTIME FOR DISCOMFORT  Symptoms  Reason For Call & Symptoms: HOW DO YOU DIAGNOSIS AN ULCER ?  She is having n/v, reflux , and stomach pain . "Every thing I eat turns sour".  She states reflux chronic for years along with Hiatel Hernia.  Onset of N/V x 2 months off/on.  She was up last night 2 a.m. with pain in stomach up into esophagus and sour burping.  She is under stress- husband has an illness.  Reviewed Health History In EMR: Yes  Reviewed Medications In EMR: Yes  Reviewed Allergies In EMR: Yes  Reviewed Surgeries / Procedures: Yes  Date of Onset of Symptoms: 10/07/2012  Treatments Tried: Rantindine does provides some relief, tums no relief and  water  Treatments Tried Worked: No  Guideline(s) Used:  Abdominal Pain - Upper  Disposition Per Guideline:   See Within 2 Weeks in Office  Reason For Disposition Reached:   Abdominal pain is a chronic symptom (recurrent or ongoing AND lasting > 4 weeks)  Advice Given:  Reducing Reflux Symptoms (GERD):  Eat smaller meals and avoid snacks for 2 hours before sleeping. Avoid  the following foods, which tend to aggravate heartburn and stomach problems: fatty/greasy foods, spicy foods, caffeinated beverages, mints, and chocolate.  Stop Smoking:  Smoking can aggravate heartburn and stomach problems.  Diet:  Slowly advance diet from clear liquids to a bland diet.  Avoid alcohol or caffeinated beverages.  Avoid greasy or fatty foods.  Fluids:   Sip clear fluids only (e.g., water, flat soft drinks, or half-strength fruit juice) until the pain is gone for 2 hours. Then slowly return to a regular diet.  Call Back If:  Abdominal pain is constant and present for more than 2 hours.  You become worse.  RN Overrode Recommendation:  Patient Requests Prescription  Her husband is ill and confused. She is under stress and cannot make it to the office this week due to his appt.  Reviewed home care and care advise. Patient would like to schedule Next Thursday with physician.  PLEASE CALL FOR SCHEDULING...AND WHAT CAN SHE TAKE OR BE GIVEN IN THE MEANTIME FOR DISCOMFORT

## 2012-12-28 ENCOUNTER — Other Ambulatory Visit: Payer: Self-pay

## 2012-12-28 MED ORDER — OMEPRAZOLE MAGNESIUM 20 MG PO TBEC: 20.0000 mg | DELAYED_RELEASE_TABLET | Freq: Two times a day (BID) | ORAL | Status: DC

## 2012-12-28 NOTE — Telephone Encounter (Signed)
Pt request refill omeprazole 20 mg to [facility]; advised pt done.

## 2013-01-09 NOTE — Telephone Encounter (Signed)
Pt left v/m that omeprazole or prilosec was not called in to [facility]. Spoke with Vernona Rieger at [facility] prescription filled on 12/28/12 and held for 9 days then put back on shelf. [facility] will [facility] about [facility] pt said was there last week and was told no prescription had been sent in for omeprazole or prilosec.

## 2013-01-15 ENCOUNTER — Telehealth: Payer: Self-pay

## 2013-01-15 MED ORDER — OMEPRAZOLE 20 MG PO CPDR: 20.0000 mg | DELAYED_RELEASE_CAPSULE | Freq: Two times a day (BID) | ORAL | Status: DC

## 2013-01-15 NOTE — Telephone Encounter (Signed)
Pt wanted Dr Ermalene Searing to know that her recent stomach problems was due to [place] recall due to bacteria in cottage cheese. Pt also recently seen by Dr Prince Rome due to fibromyalgia and arthritis; pt was restarted on Meloxicam and doing much better now.

## 2013-01-15 NOTE — Telephone Encounter (Signed)
Pt left v/m requesting omeprazole 20 mg cap instead of tablet; insurance will not cover tab due to OTC. Spoke wit Nicki at [facility] med is same except omeprazole cap is covered by ins. Advised Omeprazole 20 mg cap is OK. Pt notified done.

## 2013-01-15 NOTE — Addendum Note (Signed)
Addended by: Patience Musca on: 01/15/2013 09:45 AM   Modules accepted: Orders

## 2013-01-19 ENCOUNTER — Other Ambulatory Visit: Payer: Self-pay | Admitting: Family Medicine

## 2013-01-19 NOTE — Telephone Encounter (Signed)
Pt said [facility] only has partial refill lisinopril available; not sure why partial. Pt notified refill done. Pt also had CPX 06/13/12 and was to return in 3 months for fasting lab; pt was unaware of that request and pt wants to know if she should schedule fasting labs now or wait for CPX in 06/2013.Please advise.

## 2013-01-22 NOTE — Telephone Encounter (Signed)
No just have her wait for labs in 06/2013 prior to CPX.

## 2013-01-23 NOTE — Telephone Encounter (Signed)
Patient advised.

## 2013-02-21 ENCOUNTER — Telehealth: Payer: Self-pay

## 2013-02-21 NOTE — Telephone Encounter (Signed)
Pt said received jury duty notice and pt request letter stating why cannot participate in jury duty; due to spinal stenosis, fibromyalgia and Bodenheimer term memory loss. Call when letter ready for pick up.

## 2013-02-22 NOTE — Telephone Encounter (Signed)
Left message for patient to return my call.

## 2013-02-22 NOTE — Telephone Encounter (Signed)
I will write the letter but she needs to bring in the letter with the juror number for me to include.

## 2013-02-23 ENCOUNTER — Encounter: Payer: Self-pay | Admitting: Family Medicine

## 2013-02-23 ENCOUNTER — Telehealth: Payer: Self-pay | Admitting: Family Medicine

## 2013-02-23 NOTE — Telephone Encounter (Signed)
Patient will bring in jury summons

## 2013-02-23 NOTE — Telephone Encounter (Signed)
In your inbox.

## 2013-02-23 NOTE — Telephone Encounter (Signed)
Pt dropped off a form to be excused from jury duty. She left an addressed envelope and asked that once it's completed to please just mail it back for her.  Also, please call her to let her know it's been mailed.  I have placed it on your desk. Thank you!

## 2013-02-28 ENCOUNTER — Other Ambulatory Visit: Payer: Self-pay | Admitting: Family Medicine

## 2013-03-15 ENCOUNTER — Encounter: Payer: Self-pay | Admitting: Gastroenterology

## 2013-04-02 ENCOUNTER — Other Ambulatory Visit: Payer: Self-pay | Admitting: *Deleted

## 2013-04-02 MED ORDER — METFORMIN HCL ER 500 MG PO TB24: ORAL_TABLET | ORAL | Status: DC

## 2013-04-25 ENCOUNTER — Encounter: Payer: Self-pay | Admitting: Internal Medicine

## 2013-04-25 ENCOUNTER — Ambulatory Visit (INDEPENDENT_AMBULATORY_CARE_PROVIDER_SITE_OTHER): Payer: Medicare Other | Admitting: Internal Medicine

## 2013-04-25 VITALS — BP 132/84 | HR 59 | Temp 97.8°F | Wt 178.8 lb

## 2013-04-25 DIAGNOSIS — R3915 Urgency of urination: Secondary | ICD-10-CM

## 2013-04-25 DIAGNOSIS — R350 Frequency of micturition: Secondary | ICD-10-CM

## 2013-04-25 DIAGNOSIS — R300 Dysuria: Secondary | ICD-10-CM

## 2013-04-25 DIAGNOSIS — N3090 Cystitis, unspecified without hematuria: Secondary | ICD-10-CM

## 2013-04-25 LAB — POCT URINALYSIS DIPSTICK
Blood, UA: NEGATIVE
Glucose, UA: NEGATIVE
Ketones, UA: NEGATIVE
Nitrite, UA: NEGATIVE
Spec Grav, UA: 1.025
pH, UA: 7

## 2013-04-25 NOTE — Progress Notes (Signed)
HPI  Pt presents to the clinic today with c/o urinary urgency, frequency and dysuria. This started about 1 week. She has been taking baths more frequently when she normally takes showers. She denies nausea, vomiting, fever, chills. She has not taken anything OTC for her symptoms.   Review of Systems  Past Medical History  Diagnosis Date  . Allergy   . History of colon polyps   . Asthma with COPD   . Diabetes mellitus   . GERD (gastroesophageal reflux disease)   . Esophagitis   . HTN (hypertension)   . TIA (transient ischemic attack)   . Fibromyalgia   . Tachycardia   . Glaucoma   . Perforating folliculitis   . Spinal stenosis   . Lichen sclerosus     Family History  Problem Relation Age of Onset  . Cancer Mother     breast  . Stroke Mother   . Valvular heart disease Mother   . Kidney disease Father   . Stroke Father   . Coronary artery disease Father   . Diabetes Brother     History   Social History  . Marital Status: Married    Spouse Name: N/A    Number of Children: 1  . Years of Education: N/A   Occupational History  . retired Forensic psychologist)    Social History Main Topics  . Smoking status: Former Smoker    Quit date: 08/10/1987  . Smokeless tobacco: Never Used  . Alcohol Use: Yes     Comment: rarely  . Drug Use: No  . Sexual Activity: Not on file   Other Topics Concern  . Not on file   Social History Narrative   Regular exercise--no   Diet: fruits and veggies, calcium, water, avoids fast food   HCPOA: Kermitt Garrabrant   Has living will.          Allergies  Allergen Reactions  . Amoxicillin-Pot Clavulanate   . Codeine     REACTION: Gives her migraines  . Dorzolamide Hcl-Timolol Mal Pf   . Hydrocodone-Acetaminophen   . Lovastatin     REACTION: myalgias  . Sulfonamide Derivatives     REACTION: Rash  . Tizanidine     hallucination    Constitutional: Denies fever, malaise, fatigue, headache or abrupt weight changes.   GU: Pt reports  urgency, frequency and pain with urination. Denies burning sensation, blood in urine, odor or discharge. Skin: Denies redness, rashes, lesions or ulcercations.   No other specific complaints in a complete review of systems (except as listed in HPI above).    Objective:   Physical Exam  BP 132/84  Pulse 59  Temp(Src) 97.8 F (36.6 C) (Oral)  Wt 178 lb 12 oz (81.08 kg)  BMI 33.23 kg/m2  SpO2 97% Wt Readings from Last 3 Encounters:  04/25/13 178 lb 12 oz (81.08 kg)  06/13/12 174 lb 12 oz (79.266 kg)  05/05/12 176 lb 8 oz (80.06 kg)    General: Appears her stated age, well developed, well nourished in NAD. Cardiovascular: Normal rate and rhythm. S1,S2 noted.  No murmur, rubs or gallops noted. No JVD or BLE edema. No carotid bruits noted. Pulmonary/Chest: Normal effort and positive vesicular breath sounds. No respiratory distress. No wheezes, rales or ronchi noted.  Abdomen: Soft and nontender. Normal bowel sounds, no bruits noted. No distention or masses noted. Liver, spleen and kidneys non palpable. Tender to palpation over the bladder area. No CVA tenderness.      Assessment &  Plan:   Urgency, frequency and dysuria secondary to cystitis  Urinalysis-negative No indication for abx at this time AZO OTC Drink plenty of fluids  RTC as needed or if symptoms persist.

## 2013-04-25 NOTE — Patient Instructions (Addendum)

## 2013-05-07 NOTE — Telephone Encounter (Signed)
Pt request refills metformin [facility] to [facility]. Spoke with Lorin Picket at [facility] refills are available and Lorin Picket will get rx ready for pick up. Pt already scheduled CPX 06/29/13. Pt notified to pick up rx.

## 2013-05-29 ENCOUNTER — Other Ambulatory Visit: Payer: Self-pay | Admitting: Family Medicine

## 2013-06-14 ENCOUNTER — Other Ambulatory Visit: Payer: Self-pay

## 2013-06-25 ENCOUNTER — Telehealth: Payer: Self-pay | Admitting: Family Medicine

## 2013-06-25 ENCOUNTER — Other Ambulatory Visit (INDEPENDENT_AMBULATORY_CARE_PROVIDER_SITE_OTHER): Payer: Medicare Other

## 2013-06-25 DIAGNOSIS — E871 Hypo-osmolality and hyponatremia: Secondary | ICD-10-CM

## 2013-06-25 DIAGNOSIS — I10 Essential (primary) hypertension: Secondary | ICD-10-CM

## 2013-06-25 DIAGNOSIS — E119 Type 2 diabetes mellitus without complications: Secondary | ICD-10-CM

## 2013-06-25 DIAGNOSIS — E7800 Pure hypercholesterolemia, unspecified: Secondary | ICD-10-CM

## 2013-06-25 LAB — COMPREHENSIVE METABOLIC PANEL
AST: 22 U/L (ref 0–37)
Albumin: 4.2 g/dL (ref 3.5–5.2)
Alkaline Phosphatase: 55 U/L (ref 39–117)
BUN: 22 mg/dL (ref 6–23)
Calcium: 10.3 mg/dL (ref 8.4–10.5)
Chloride: 102 mEq/L (ref 96–112)
Glucose, Bld: 117 mg/dL — ABNORMAL HIGH (ref 70–99)
Potassium: 4.9 mEq/L (ref 3.5–5.1)
Sodium: 136 mEq/L (ref 135–145)
Total Protein: 7.6 g/dL (ref 6.0–8.3)

## 2013-06-25 LAB — LDL CHOLESTEROL, DIRECT: Direct LDL: 183.4 mg/dL

## 2013-06-25 LAB — LIPID PANEL
Cholesterol: 264 mg/dL — ABNORMAL HIGH (ref 0–200)
Total CHOL/HDL Ratio: 5

## 2013-06-25 NOTE — Telephone Encounter (Signed)
Message copied by Excell Seltzer on Mon Jun 25, 2013  8:24 AM ------      Message from: Alvina Chou      Created: Tue Jun 19, 2013  4:26 PM      Regarding: Lab orders for Monday, 11.17.14       Patient is scheduled for CPX labs, please order future labs, Thanks , Terri       ------

## 2013-06-29 ENCOUNTER — Ambulatory Visit (INDEPENDENT_AMBULATORY_CARE_PROVIDER_SITE_OTHER): Payer: Medicare Other | Admitting: Family Medicine

## 2013-06-29 ENCOUNTER — Encounter: Payer: Self-pay | Admitting: Family Medicine

## 2013-06-29 VITALS — BP 160/72 | HR 63 | Temp 98.0°F | Ht 61.5 in | Wt 177.0 lb

## 2013-06-29 DIAGNOSIS — R002 Palpitations: Secondary | ICD-10-CM

## 2013-06-29 DIAGNOSIS — E7800 Pure hypercholesterolemia, unspecified: Secondary | ICD-10-CM

## 2013-06-29 DIAGNOSIS — I10 Essential (primary) hypertension: Secondary | ICD-10-CM

## 2013-06-29 DIAGNOSIS — E119 Type 2 diabetes mellitus without complications: Secondary | ICD-10-CM

## 2013-06-29 DIAGNOSIS — Z1212 Encounter for screening for malignant neoplasm of rectum: Secondary | ICD-10-CM

## 2013-06-29 DIAGNOSIS — Z23 Encounter for immunization: Secondary | ICD-10-CM

## 2013-06-29 DIAGNOSIS — IMO0001 Reserved for inherently not codable concepts without codable children: Secondary | ICD-10-CM

## 2013-06-29 DIAGNOSIS — Z0000 Encounter for general adult medical examination without abnormal findings: Secondary | ICD-10-CM

## 2013-06-29 LAB — HM DIABETES FOOT EXAM

## 2013-06-29 MED ORDER — CYANOCOBALAMIN 1000 MCG/ML IJ SOLN
1000.0000 ug | Freq: Once | INTRAMUSCULAR | Status: AC
  Administered 2013-06-29: 1000 ug via INTRAMUSCULAR

## 2013-06-29 MED ORDER — COLESEVELAM HCL 3.75 G PO PACK: 3.7500 g | PACK | Freq: Every day | ORAL | Status: DC

## 2013-06-29 NOTE — Progress Notes (Signed)
HPI  I have personally reviewed the Medicare Annual Wellness questionnaire and have noted  1. The patient's medical and social history  2. Their use of alcohol, tobacco or illicit drugs  3. Their current medications and supplements  4. The patient's functional ability including ADL's, fall risks, home safety risks and hearing or visual  impairment.  5. Diet and physical activities  6. Evidence for depression or mood disorders  The patients weight, height, BMI and visual acuity have been recorded in the chart  I have made referrals, counseling and provided education to the patient based review of the above and I have provided the pt with a written personalized care plan for preventive services.    Family health issues are causing stress. Husband has been very irritable. Fibromyalgia: very poor control.  She is struggling with her mood, she is trying to keep it in.  She plans to talk with her priest. Using tramadol 100 mg every 6 hours. She tries to not use it. Using voltaren cream for arthritis in hand and back.Marland Kitchen Helps a lot. She was wondering if B12 injections would help with energy... Has helped in a flare in past.  Hypertension: previously at goal <130/80 on lisinopril, metoprolol, propranolol prn.  Today BP is elevated. She has been under a lot of stress lately. Her daughter has a brain tumor. BP Readings from Last 3 Encounters:  06/29/13 160/72  04/25/13 132/84  06/13/12 130/70  Using medication without problems or lightheadedness: see below  Chest pain with exertion:None  Edema:None  Brasel of breath:stable Average home BPs: not checking lately. Other issues:   Elevated Cholesterol:  Inadequate control, goal <70 given TIA On no medication. Poor control tri and LDL far from goal. Never tried flax seed oil, does not tolerated red yeast rice, SE to several statin meds...severe myalgia.  ? welchol .. she never tried last year. Lab Results  Component Value Date   CHOL 264*  06/25/2013   HDL 54.00 06/25/2013   LDLCALC See Comment mg/dL 16/08/958   LDLDIRECT 454.0 06/25/2013   TRIG 238.0* 06/25/2013   CHOLHDL 5 06/25/2013  Diet compliance: Moderate.  Exercise minimal. Other complaints: Hx of TIA.off ASA   Diabetes: At goal on Glucophage.  Lab Results  Component Value Date   HGBA1C 6.5 06/25/2013  Using medications without difficulties: None  Hypoglycemic episodes:None  Hyperglycemic episodes:None  Feet problems:None  Blood Sugars averaging:100-120 eye exam within last year:yes  Wt Readings from Last 3 Encounters:  06/29/13 177 lb (80.287 kg)  04/25/13 178 lb 12 oz (81.08 kg)  06/13/12 174 lb 12 oz (79.266 kg)    Saw Dr. Mariah Milling for episodes in 03/2012 He stated: Etiology of her palpitations and tachycardia episodes is uncertain. We have again offered a 30 day monitor. We have also offered she come to the office when she has episodes for an EKG. We have provided her with propranolol to take as needed for episodes in addition to her daily metoprolol. She states that she would like to try to watch her symptoms for now as they have improved with her sodium improving. She has less episodes. She will come to the office when she has episodes. If she is unable to come to the office for symptoms at nighttime, she will call her office for [place] to order a 30 day monitor. We have suggested she try the propranolol when necessary. Other option would be to try diltiazem 30 mg when necessary for symptom relief. Unable to exclude panic attacks  or anxiety.    She has only had one recent episode of palpitations in last few months.. Secondary to anxiety.  Review of Systems  Constitutional: Negative for fever, fatigue and unexpected weight change.  HENT: Negative for ear pain, congestion, sore throat, sneezing, trouble swallowing and sinus pressure.  Eyes: Negative for pain and itching.  Respiratory: Negative for cough, shortness of breath and wheezing.  Cardiovascular:  Negative for chest pain, palpitations and leg swelling.  Gastrointestinal: Negative for nausea, abdominal pain, diarrhea, constipation and blood in stool.  Genitourinary: Negative for dysuria, hematuria, vaginal discharge, difficulty urinating and menstrual problem.  Skin: Negative for rash.  Neurological: Negative for syncope, weakness, light-headedness, numbness and headaches.  Psychiatric/Behavioral: Negative for confusion and dysphoric mood. The patient is not nervous/anxious.  Mole on right arm dull Appearing , mole under left breast is itchy.  Objective:   Physical Exam  Constitutional: Vital signs are normal. She appears well-developed and well-nourished. She is cooperative. Non-toxic appearance. She does not appear ill. No distress.  HENT:  Head: Normocephalic.  Right Ear: Hearing, tympanic membrane, external ear and ear canal normal.  Left Ear: Hearing, tympanic membrane, external ear and ear canal normal.  Nose: Nose normal.  Eyes: Conjunctivae normal, EOM and lids are normal. Pupils are equal, round, and reactive to light. No foreign bodies found.  Neck: Trachea normal and normal range of motion. Neck supple. Carotid bruit is not present. No mass and no thyromegaly present.  Cardiovascular: Normal rate, regular rhythm, S1 normal, S2 normal, normal heart sounds and intact distal pulses. Exam reveals no gallop.  No murmur heard.  Pulmonary/Chest: Effort normal and breath sounds normal. No respiratory distress. She has no wheezes. She has no rhonchi. She has no rales.  Abdominal: Soft. Normal appearance and bowel sounds are normal. She exhibits no distension, no fluid wave, no abdominal bruit and no mass. There is no hepatosplenomegaly. There is no tenderness. There is no rebound, no guarding and no CVA tenderness. No hernia.  Genitourinary: No breast swelling, tenderness, discharge or bleeding. Pelvic exam was performed with patient supine.  Lymphadenopathy:  She has no cervical  adenopathy.  She has no axillary adenopathy.  Neurological: She is alert. She has normal strength. No cranial nerve deficit or sensory deficit.  Skin: Skin is warm, dry and intact. No rash noted.  Psychiatric: Her speech is normal and behavior is normal. Judgment normal. Her mood appears not anxious. Cognition and memory are normal. She does not exhibit a depressed mood.   Diabetic foot exam: Normal inspection No skin breakdown.. Small crack on left great toe base.Marland Kitchen No erythema... Applying neosporin and bandaid No calluses  Normal DP pulses Normal sensation to light touch and monofilament Nails normal  Assessment & Plan:   1 st annual Medicare Physical:  The patient's preventative maintenance and recommended screening tests for an annual wellness exam were reviewed in full today.  Brought up to date unless services declined.  Counselled on the importance of diet, exercise, and its role in overall health and mortality.  The patient's FH and SH was reviewed, including their home life, tobacco status, and drug and alcohol status.   DXA: 05/2011 nml, repeat in 5 years Mammo: nml 08/2011, mother with breast cancer, due for repeat PAP/DVE: not indicated s/p abdominal hysterectomy, right ovary remaining for noncancerous reason.[place] Robertson Dr. Shawnie Pons for GYN for lichen sclerosis.  Vaccines:Up to date with TDap and PNA and planning shingles vaccine. Flu today.  Colon cancer screening: colonoscopy Benign polyp  2003 , repeat in 10 years. Ifob yearly instead.

## 2013-06-29 NOTE — Assessment & Plan Note (Signed)
Poor control. Will try B12 injection as it has helped her in flares in past. Stress reduction.  Tramadol for pain.

## 2013-06-29 NOTE — Assessment & Plan Note (Signed)
Elevated likely due to pain and stress. Follow at home.. If consistently elevated... Consider increase in lisniopril.

## 2013-06-29 NOTE — Patient Instructions (Addendum)
Follow Bp at home.. Call with measurements in 1-2 week. Start welchol for cholesterol control.  Follow up labs fasting in 3 months. Call to schedule to a mammogram. Get shingles vaccine at pharmacy.  Stop at lab to pick up stool test.

## 2013-06-29 NOTE — Assessment & Plan Note (Signed)
Worsened control but still at goal . Continue current medication. Encouraged exercise, weight loss, healthy eating habits.

## 2013-06-29 NOTE — Assessment & Plan Note (Signed)
Poor control. Start welchol and recheck in 3 months.

## 2013-06-29 NOTE — Progress Notes (Signed)
Pre-visit discussion using our clinic review tool. No additional management support is needed unless otherwise documented below in the visit note.  

## 2013-06-29 NOTE — Assessment & Plan Note (Signed)
Now rare occurrence, stress related.

## 2013-07-13 ENCOUNTER — Telehealth: Payer: Self-pay | Admitting: Family Medicine

## 2013-07-13 ENCOUNTER — Other Ambulatory Visit (INDEPENDENT_AMBULATORY_CARE_PROVIDER_SITE_OTHER): Payer: Medicare Other

## 2013-07-13 DIAGNOSIS — R195 Other fecal abnormalities: Secondary | ICD-10-CM

## 2013-07-13 DIAGNOSIS — Z1212 Encounter for screening for malignant neoplasm of rectum: Secondary | ICD-10-CM

## 2013-07-13 LAB — FECAL OCCULT BLOOD, IMMUNOCHEMICAL: Fecal Occult Bld: POSITIVE

## 2013-07-13 NOTE — Telephone Encounter (Signed)
Message copied by Excell Seltzer on Fri Jul 13, 2013  4:49 PM ------      Message from: Damita Lack      Created: Fri Jul 13, 2013  4:23 PM       Patient notified as instructed by telephone.  She would like to move forward with the colonoscopy. ------

## 2013-07-13 NOTE — Telephone Encounter (Signed)
Referral made 

## 2013-07-18 ENCOUNTER — Encounter: Payer: Self-pay | Admitting: Family Medicine

## 2013-07-18 ENCOUNTER — Encounter: Payer: Self-pay | Admitting: Gastroenterology

## 2013-07-18 DIAGNOSIS — R195 Other fecal abnormalities: Secondary | ICD-10-CM

## 2013-08-07 ENCOUNTER — Other Ambulatory Visit: Payer: Self-pay | Admitting: Family Medicine

## 2013-08-14 ENCOUNTER — Other Ambulatory Visit: Payer: Self-pay | Admitting: Family Medicine

## 2013-08-30 ENCOUNTER — Ambulatory Visit (AMBULATORY_SURGERY_CENTER): Payer: Commercial Managed Care - HMO

## 2013-08-30 ENCOUNTER — Telehealth: Payer: Self-pay

## 2013-08-30 VITALS — Ht 62.0 in | Wt 176.6 lb

## 2013-08-30 DIAGNOSIS — R195 Other fecal abnormalities: Secondary | ICD-10-CM

## 2013-08-30 MED ORDER — SUPREP BOWEL PREP KIT 17.5-3.13-1.6 GM/177ML PO SOLN: 1.0000 | Freq: Once | ORAL | Status: DC

## 2013-08-30 NOTE — Telephone Encounter (Signed)
Pt concerned.  She was over-sedated during a procedure with [facility] pain clinic (Dr Mohammed Kindle).  Probably conscious sedation with Fentanyl and Versed.  Reassured pt that this sedation was different with less side effects.  She just wanted anesthesia to know her history.

## 2013-09-03 ENCOUNTER — Telehealth: Payer: Self-pay | Admitting: Family Medicine

## 2013-09-03 ENCOUNTER — Other Ambulatory Visit: Payer: Self-pay | Admitting: *Deleted

## 2013-09-03 ENCOUNTER — Other Ambulatory Visit: Payer: Self-pay | Admitting: Family Medicine

## 2013-09-03 MED ORDER — LISINOPRIL 10 MG PO TABS: ORAL_TABLET | ORAL | Status: DC

## 2013-09-03 MED ORDER — GLUCOSE BLOOD VI STRP: ORAL_STRIP | Status: DC

## 2013-09-03 MED ORDER — METOPROLOL TARTRATE 50 MG PO TABS: ORAL_TABLET | ORAL | Status: DC

## 2013-09-03 MED ORDER — ACCU-CHEK AVIVA DEVI: Status: DC

## 2013-09-03 MED ORDER — METFORMIN HCL ER 500 MG PO TB24: 500.0000 mg | ORAL_TABLET | Freq: Two times a day (BID) | ORAL | Status: DC

## 2013-09-03 MED ORDER — ACCU-CHEK SOFTCLIX LANCETS MISC: Status: DC

## 2013-09-03 NOTE — Telephone Encounter (Signed)
Pt left vm saying that her insurance messed up her mail order medications and she had to resubmit everything to them.  She said it will be 14 days before her medications are delivered and she is requesting either a partial refill or samples of Metoprolol (she is completely out of this) or Metformin (she has 6 days left) sent to [facility] in [place].

## 2013-09-04 MED ORDER — METOPROLOL TARTRATE 50 MG PO TABS: ORAL_TABLET | ORAL | Status: DC

## 2013-09-04 MED ORDER — METFORMIN HCL ER 500 MG PO TB24: 500.0000 mg | ORAL_TABLET | Freq: Two times a day (BID) | ORAL | Status: DC

## 2013-09-04 NOTE — Telephone Encounter (Signed)
Left message for Cathy Mullins that no samples were available and that I sent in a two week supply of her Metoprolol and Metformin in to the [facility] in [place].

## 2013-09-04 NOTE — Telephone Encounter (Signed)
Okay to send in refills as requested.

## 2013-09-11 ENCOUNTER — Ambulatory Visit (AMBULATORY_SURGERY_CENTER): Payer: Medicare HMO | Admitting: Gastroenterology

## 2013-09-11 ENCOUNTER — Encounter: Payer: Self-pay | Admitting: Gastroenterology

## 2013-09-11 VITALS — BP 115/69 | HR 76 | Temp 97.6°F | Resp 19 | Ht 62.0 in | Wt 176.0 lb

## 2013-09-11 DIAGNOSIS — K5730 Diverticulosis of large intestine without perforation or abscess without bleeding: Secondary | ICD-10-CM

## 2013-09-11 DIAGNOSIS — K648 Other hemorrhoids: Secondary | ICD-10-CM

## 2013-09-11 DIAGNOSIS — R195 Other fecal abnormalities: Secondary | ICD-10-CM

## 2013-09-11 LAB — GLUCOSE, CAPILLARY
GLUCOSE-CAPILLARY: 102 mg/dL — AB (ref 70–99)
GLUCOSE-CAPILLARY: 93 mg/dL (ref 70–99)

## 2013-09-11 MED ORDER — SODIUM CHLORIDE 0.9 % IV SOLN: 500.0000 mL | INTRAVENOUS | Status: DC

## 2013-09-11 NOTE — Patient Instructions (Signed)

## 2013-09-11 NOTE — Op Note (Signed)
[place]  [address]. [place], [id number]   COLONOSCOPY PROCEDURE REPORT  PATIENT: Cathy Mullins, Cathy Mullins  MR#: [id number] BIRTHDATE: October 12, 1945 , 38  yrs. old GENDER: Female ENDOSCOPIST: Inda Castle, MD REFERRED BY:Amy Cletis Athens, M.D. PROCEDURE DATE:  09/11/2013 PROCEDURE:   Colonoscopy, diagnostic First Screening Colonoscopy - Avg.  risk and is 50 yrs.  old or older - No.  Prior Negative Screening - Now for repeat screening. 10 or more years since last screening  History of Adenoma - Now for follow-up colonoscopy & has been > or = to 3 yrs.  N/A  Polyps Removed Today? No.  Recommend repeat exam, <10 yrs? No. ASA CLASS:   Class II INDICATIONS:heme-positive stool. MEDICATIONS: MAC sedation, administered by CRNA and propofol (Diprivan) 350mg  IV  DESCRIPTION OF PROCEDURE:   After the risks benefits and alternatives of the procedure were thoroughly explained, informed consent was obtained.  A digital rectal exam revealed no abnormalities of the rectum.   The LB [id number]  endoscope was introduced through the anus and advanced to the cecum, which was identified by both the appendix and ileocecal valve. No adverse events experienced.   The quality of the prep was Suprep good  The instrument was then slowly withdrawn as the colon was fully examined.      COLON FINDINGS: There was moderate diverticulosis noted in the sigmoid colon with associated muscular hypertrophy and luminal narrowing.   Internal hemorrhoids were found.   The colon was otherwise normal.  There was no diverticulosis, inflammation, polyps or cancers unless previously stated.  Retroflexed views revealed no abnormalities. The time to cecum=6 minutes 02 seconds. Withdrawal time=10 minutes 05 seconds.  The scope was withdrawn and the procedure completed. COMPLICATIONS: There were no complications.  ENDOSCOPIC IMPRESSION: 1.   There was moderate diverticulosis noted in the sigmoid  colon 2.   Internal hemorrhoids 3.   The colon was otherwise normal  occult positive stool probably secondary to hemorrhoids  RECOMMENDATIONS: followup Hemoccults in one week  eSigned:  Inda Castle, MD 09/11/2013 3:31 PM   cc:   PATIENT NAME:  Cathy Mullins, Cathy Mullins MR#: [id number]

## 2013-09-11 NOTE — Progress Notes (Signed)
Lidocaine-40mg IV prior to Propofol InductionPropofol given over incremental dosages 

## 2013-09-12 ENCOUNTER — Telehealth: Payer: Self-pay | Admitting: *Deleted

## 2013-09-12 ENCOUNTER — Telehealth: Payer: Self-pay | Admitting: Family Medicine

## 2013-09-12 NOTE — Telephone Encounter (Signed)
Spoke with patient, advised to take Zyrtec at bed time, call back if not improving in 4-5 days... Gasper Sells, MA Student

## 2013-09-12 NOTE — Telephone Encounter (Signed)
  Follow up Call-  Call back number 09/11/2013  Post procedure Call Back phone  # [phone]  Permission to leave phone message Yes     Patient questions:  Do you have a fever, pain , or abdominal swelling? no Pain Score  0 *  Have you tolerated food without any problems? yes  Have you been able to return to your normal activities? yes  Do you have any questions about your discharge instructions: Diet   no Medications  no Follow up visit  no  Do you have questions or concerns about your Care? no  Actions: * If pain score is 4 or above: No action needed, pain <4.

## 2013-09-12 NOTE — Telephone Encounter (Signed)
Patient Information:  Caller Name: Saphira  Phone: [phone]  Patient: Cathy Mullins, Cathy Mullins  Gender: Female  DOB: [birth year]  Age: 68 Years  PCP: Eliezer Lofts (Family Practice)  Office Follow Up:  Does the office need to follow up with this patient?: Yes  Instructions For The Office: Please see note below regarding pt's sxs and requests and follow up with pt.   Symptoms  Reason For Call & Symptoms: Onset approximately 1 month ago, pt went out without sunglasses and sneezed constantly, 24 times in a row at one time, runny nose/congestion.  Since that time, pt has continued to sneeze a lot every day & runny nose.    09/12/13 sneezing still continues, constant runny nose with clear drainage, hears some 'crackling' in left ear.   No sinus pain, no headaches.  Afebrile.  Pt has taken Nasocort,  Loratadine without relief, only mild relief.    Advised pt of need for appt.  Pt is on bedrest at this time due to hemorrhoid surgery on 09/11/13 and cannot come into office for visit, also pt is very hesitant to expose herself to the flu by coming in to the office due to daughter has brain tumors which she is currently undergoing treatment and pt states they can't afford to expose any illness to daughter.   Pt is requesting Rx or recommendations to get sneezing/runny nose to stop.  Reviewed Health History In EMR: Yes  Reviewed Medications In EMR: Yes  Reviewed Allergies In EMR: Yes  Reviewed Surgeries / Procedures: Yes  Date of Onset of Symptoms: 08/09/2013  Treatments Tried: Nasocort, Loratadine  Treatments Tried Worked: No  Guideline(s) Used:  Hay Fever - Nasal Allergies  Disposition Per Guideline:   See Today or Tomorrow in Office  Reason For Disposition Reached:   Nasal discharge present > 10 days  Advice Given:  For a Stuffy Nose - Use Nasal Washes:  Introduction: Saline (salt water) nasal irrigation (nasal washes) is an effective and simple home remedy for treating stuffy nose and sinus congestion.  The nose can be irrigated by pouring, spraying, or squirting salt water into the nose and then letting it run back out.  How it Helps: The salt water rinses out excess mucus, washes out any irritants (dust, allergens) that might be present, and moistens the nasal cavity.  Antihistamine Medications for Hay Fever:  Antihistamines help reduce sneezing, itching, and runny nose.  Call Back If:  You become worse  Patient Refused Recommendation:  Patient Requests Prescription  Please see notes for pt's request

## 2013-09-12 NOTE — Telephone Encounter (Signed)
Have her start zyrtec at bedtime. Have her call back i f not improving in next 4-5 days.

## 2013-09-13 ENCOUNTER — Other Ambulatory Visit: Payer: Self-pay

## 2013-09-13 ENCOUNTER — Encounter: Payer: Medicare Other | Admitting: Gastroenterology

## 2013-09-13 DIAGNOSIS — R195 Other fecal abnormalities: Secondary | ICD-10-CM

## 2013-09-17 ENCOUNTER — Encounter: Payer: Self-pay | Admitting: Family Medicine

## 2013-09-27 ENCOUNTER — Encounter: Payer: Self-pay | Admitting: Gastroenterology

## 2013-09-28 ENCOUNTER — Other Ambulatory Visit (INDEPENDENT_AMBULATORY_CARE_PROVIDER_SITE_OTHER): Payer: Commercial Managed Care - HMO

## 2013-09-28 DIAGNOSIS — I10 Essential (primary) hypertension: Secondary | ICD-10-CM

## 2013-09-28 DIAGNOSIS — E7800 Pure hypercholesterolemia, unspecified: Secondary | ICD-10-CM

## 2013-09-28 DIAGNOSIS — E119 Type 2 diabetes mellitus without complications: Secondary | ICD-10-CM

## 2013-09-28 LAB — COMPREHENSIVE METABOLIC PANEL
ALK PHOS: 59 U/L (ref 39–117)
ALT: 25 U/L (ref 0–35)
AST: 18 U/L (ref 0–37)
Albumin: 4 g/dL (ref 3.5–5.2)
BUN: 25 mg/dL — ABNORMAL HIGH (ref 6–23)
CALCIUM: 9.6 mg/dL (ref 8.4–10.5)
CO2: 30 mEq/L (ref 19–32)
CREATININE: 1.1 mg/dL (ref 0.4–1.2)
Chloride: 105 mEq/L (ref 96–112)
GFR: 51.5 mL/min — ABNORMAL LOW (ref >60.00)
Glucose, Bld: 131 mg/dL — ABNORMAL HIGH (ref 70–99)
Potassium: 4.5 mEq/L (ref 3.5–5.1)
Sodium: 140 mEq/L (ref 135–145)
Total Bilirubin: 0.5 mg/dL (ref 0.3–1.2)
Total Protein: 7.6 g/dL (ref 6.0–8.3)

## 2013-09-28 LAB — LIPID PANEL
CHOL/HDL RATIO: 5
Cholesterol: 285 mg/dL — ABNORMAL HIGH (ref 0–200)
HDL: 54.9 mg/dL (ref >39.00)
Triglycerides: 260 mg/dL — ABNORMAL HIGH (ref 0.0–149.0)
VLDL: 52 mg/dL — AB (ref 0.0–40.0)

## 2013-09-28 LAB — LDL CHOLESTEROL, DIRECT: LDL DIRECT: 194.6 mg/dL

## 2013-10-31 ENCOUNTER — Telehealth: Payer: Self-pay | Admitting: *Deleted

## 2013-10-31 ENCOUNTER — Other Ambulatory Visit: Payer: Self-pay | Admitting: Family Medicine

## 2013-10-31 DIAGNOSIS — R5383 Other fatigue: Principal | ICD-10-CM

## 2013-10-31 DIAGNOSIS — R5381 Other malaise: Secondary | ICD-10-CM

## 2013-10-31 NOTE — Telephone Encounter (Addendum)
Chai states she had a colonscopy and Dr. Deatra Ina had wanted her to do hemoccult cards a few weeks after procedure to make sure she did not have any blood in her stools.  She did the cards but they got held up in the mail and was returned to her.  She is wanting to know if Dr. Diona Browner thinks she really needs to do the hemoccult cards.  Also asking to have her Thyroid check due to fatigue, leg pain at night, memory problem & neuropathy in her feet.  Spoke with Dr. Diona Browner.  She thinks Ms. Kinner should repeat the hemoccult cards and she is ok having Mrs. Stults come in to have her TSH checked.  Mrs. Contino informed to repeat stool cards since Dr. Deatra Ina requested that she do that and lab appointment scheduled for 11/01/2013 @ 10:15 am to have her TSH drawn.  Mrs. Sherr thinks she has another set of cards because Dr. Deatra Ina had given her a set after her procedure and then they mailed her a set.  Advised if she can't find them, to call or go by Dr. Kelby Fam office to pick up another set of cards.

## 2013-11-01 ENCOUNTER — Other Ambulatory Visit (INDEPENDENT_AMBULATORY_CARE_PROVIDER_SITE_OTHER): Payer: Commercial Managed Care - HMO

## 2013-11-01 DIAGNOSIS — R5381 Other malaise: Secondary | ICD-10-CM

## 2013-11-01 DIAGNOSIS — R5383 Other fatigue: Principal | ICD-10-CM

## 2013-11-01 LAB — TSH: TSH: 1.87 u[IU]/mL (ref 0.35–5.50)

## 2013-11-23 ENCOUNTER — Telehealth: Payer: Self-pay

## 2013-11-23 NOTE — Telephone Encounter (Signed)
The patient states she needs a referral to see Dr.Copland for a sports medicine injury due to her insurance   Pt callback - [phone]

## 2013-11-26 ENCOUNTER — Encounter: Payer: Self-pay | Admitting: Family Medicine

## 2013-11-26 ENCOUNTER — Ambulatory Visit (INDEPENDENT_AMBULATORY_CARE_PROVIDER_SITE_OTHER): Payer: Medicare HMO | Admitting: Family Medicine

## 2013-11-26 VITALS — BP 134/76 | HR 69 | Temp 98.0°F | Ht 61.5 in | Wt 182.5 lb

## 2013-11-26 DIAGNOSIS — M25519 Pain in unspecified shoulder: Secondary | ICD-10-CM

## 2013-11-26 DIAGNOSIS — M6790 Unspecified disorder of synovium and tendon, unspecified site: Secondary | ICD-10-CM

## 2013-11-26 DIAGNOSIS — M25512 Pain in left shoulder: Secondary | ICD-10-CM

## 2013-11-26 DIAGNOSIS — IMO0001 Reserved for inherently not codable concepts without codable children: Secondary | ICD-10-CM

## 2013-11-26 DIAGNOSIS — F331 Major depressive disorder, recurrent, moderate: Secondary | ICD-10-CM

## 2013-11-26 DIAGNOSIS — M7550 Bursitis of unspecified shoulder: Secondary | ICD-10-CM

## 2013-11-26 DIAGNOSIS — IMO0002 Reserved for concepts with insufficient information to code with codable children: Secondary | ICD-10-CM

## 2013-11-26 DIAGNOSIS — M67912 Unspecified disorder of synovium and tendon, left shoulder: Secondary | ICD-10-CM

## 2013-11-26 DIAGNOSIS — M719 Bursopathy, unspecified: Secondary | ICD-10-CM

## 2013-11-26 DIAGNOSIS — M75100 Unspecified rotator cuff tear or rupture of unspecified shoulder, not specified as traumatic: Secondary | ICD-10-CM

## 2013-11-26 MED ORDER — VENLAFAXINE HCL ER 37.5 MG PO CP24: 37.5000 mg | ORAL_CAPSULE | Freq: Every day | ORAL | Status: DC

## 2013-11-26 MED ORDER — TRAMADOL HCL 50 MG PO TABS: 50.0000 mg | ORAL_TABLET | Freq: Four times a day (QID) | ORAL | Status: DC

## 2013-11-26 NOTE — Telephone Encounter (Signed)
No Referral required, billed as Primary Care.
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
## 2013-11-26 NOTE — Patient Instructions (Signed)
REFERRALS TO SPECIALISTS, SPECIAL TESTS (MRI, CT, ULTRASOUNDS)  GO THE WAITING ROOM AND TELL CHECK IN YOU NEED HELP WITH A REFERRAL. Either MARION or LINDA will help you set it up.  If it is between 1-2 PM they may be at lunch.  After 5 PM, they will likely be at home.  They will call you, so please make sure the office has your correct phone number.  Referrals sometimes can be done same day if urgent, but others can take 2 or 3 days to get an appointment. Starting in 2015, many of the new Medicare insurance plans and Affordable Care Act (Obamacare) Health plans offered take much longer for referrals. They have added additional paperwork and steps.  MRI's and CT's can take up to a week for the test. (Emergencies like strokes take precedence. I will tell you if you have an emergency.)   If your referral is to an in-network [facility] office, their office may contact you directly prior to our office reaching you.  -- Examples: [facility], [facility], [facility], [facility], [facility], and many more.  Specialist appointment times vary a great deal, mostly on the specialist's schedule and if they have openings. -- Our office tries to get you in as fast as possible. -- Some specialists have very long wait times. (Example. Dermatology. Usually months) -- If you have a true emergency like new cancer, we work to get you in ASAP.   

## 2013-11-26 NOTE — Progress Notes (Signed)
Pre visit review using our clinic review tool, if applicable. No additional management support is needed unless otherwise documented below in the visit note. 

## 2013-11-27 DIAGNOSIS — F331 Major depressive disorder, recurrent, moderate: Secondary | ICD-10-CM | POA: Insufficient documentation

## 2013-11-29 ENCOUNTER — Other Ambulatory Visit: Payer: Self-pay | Admitting: Family Medicine

## 2013-11-29 NOTE — Progress Notes (Signed)
Spoke with Ms. Cathy Mullins and informed her of information provided below by Dr. Lorelei Pont.  Patient states she will stick with the Voltaren Gel for now.

## 2013-11-29 NOTE — Progress Notes (Signed)
"  I also use Voltarn Gel for my hands and lower back. I have one whole tube left. I understand there is a generic form that costs a lot less. Could you send a prescription for the generic to [facility] for my future refills? "   i got a detailed form response / questionaire from this patient.  Please call her.   Voltaren gel is branded medication and will be for some time.  Pennsaid has a generic alternative that is new. It is an alcohol based voltaren solution. It may or may not be cheaper with your insurance. It is liquid and is not the same consistency as voltaren gel.   It is up to the patient. If she would like, we can send her in a 3 month supply of pennsaid with 3 refills.   Electronically Signed  By: Owens Loffler, MD On: 11/29/2013 11:09 AM

## 2013-12-18 ENCOUNTER — Other Ambulatory Visit: Payer: Self-pay | Admitting: *Deleted

## 2013-12-18 MED ORDER — VENLAFAXINE HCL ER 37.5 MG PO CP24: 37.5000 mg | ORAL_CAPSULE | Freq: Every day | ORAL | Status: DC

## 2014-01-08 ENCOUNTER — Encounter: Payer: Self-pay | Admitting: Family Medicine

## 2014-01-08 ENCOUNTER — Ambulatory Visit (INDEPENDENT_AMBULATORY_CARE_PROVIDER_SITE_OTHER)
Admission: RE | Admit: 2014-01-08 | Discharge: 2014-01-08 | Disposition: A | Discharge status: Home / Self Care | Payer: Commercial Managed Care - HMO | Source: Ambulatory Visit | Attending: Family Medicine | Admitting: Family Medicine

## 2014-01-08 ENCOUNTER — Ambulatory Visit (INDEPENDENT_AMBULATORY_CARE_PROVIDER_SITE_OTHER): Payer: Commercial Managed Care - HMO | Admitting: Family Medicine

## 2014-01-08 VITALS — BP 141/75 | HR 60 | Temp 98.4°F | Ht 61.5 in | Wt 180.8 lb

## 2014-01-08 DIAGNOSIS — F331 Major depressive disorder, recurrent, moderate: Secondary | ICD-10-CM

## 2014-01-08 DIAGNOSIS — M5412 Radiculopathy, cervical region: Secondary | ICD-10-CM

## 2014-01-08 DIAGNOSIS — IMO0001 Reserved for inherently not codable concepts without codable children: Secondary | ICD-10-CM

## 2014-01-08 MED ORDER — PREDNISONE 20 MG PO TABS: ORAL_TABLET | ORAL | Status: DC

## 2014-01-08 NOTE — Assessment & Plan Note (Signed)
IMproiving control. Increase effexor Follow up in 4 weeks.

## 2014-01-08 NOTE — Assessment & Plan Note (Signed)
Poor control. Likely causing most of pain with severe flare.  Increase effexor.

## 2014-01-08 NOTE — Assessment & Plan Note (Signed)
Start prednisone taper.  Eval with X-ray cervical spine, likely will need MRI  To eval.

## 2014-01-08 NOTE — Patient Instructions (Addendum)
Increase venlafaxine to 2 tabs a day. Start prednisone taper. Stop at X-ray we will call with referral.  Can use 100 mg tramadol in [place] term for acute pain. Follow up in 4 weeks 30 min OV.
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
## 2014-01-08 NOTE — Progress Notes (Signed)
Pre visit review using our clinic review tool, if applicable. No additional management support is needed unless otherwise documented below in the visit note. 

## 2014-02-01 ENCOUNTER — Other Ambulatory Visit: Payer: Self-pay

## 2014-02-01 MED ORDER — VENLAFAXINE HCL ER 37.5 MG PO CP24: 75.0000 mg | ORAL_CAPSULE | Freq: Every day | ORAL | Status: DC

## 2014-02-01 NOTE — Telephone Encounter (Signed)
Pt request refill venlafaxine XR to [facility]; pt doing well on med but pt has f/u appt on 02/12/14 and will send # [phone] to [facility] [place] until f/u appt. Pt voiced understanding.

## 2014-02-12 ENCOUNTER — Ambulatory Visit (INDEPENDENT_AMBULATORY_CARE_PROVIDER_SITE_OTHER): Payer: Commercial Managed Care - HMO | Admitting: Family Medicine

## 2014-02-12 ENCOUNTER — Encounter: Payer: Self-pay | Admitting: Family Medicine

## 2014-02-12 VITALS — BP 126/66 | HR 60 | Temp 98.1°F | Wt 179.5 lb

## 2014-02-12 DIAGNOSIS — F331 Major depressive disorder, recurrent, moderate: Secondary | ICD-10-CM

## 2014-02-12 DIAGNOSIS — IMO0001 Reserved for inherently not codable concepts without codable children: Secondary | ICD-10-CM

## 2014-02-12 DIAGNOSIS — M5412 Radiculopathy, cervical region: Secondary | ICD-10-CM

## 2014-02-12 MED ORDER — VENLAFAXINE HCL ER 75 MG PO CP24: 75.0000 mg | ORAL_CAPSULE | Freq: Every day | ORAL | Status: DC

## 2014-02-12 NOTE — Assessment & Plan Note (Signed)
Improved control on higher dose venlafaxine. Refill.

## 2014-02-12 NOTE — Assessment & Plan Note (Signed)
Improved after treatment with prednsione taper.  no current further eval needed.

## 2014-02-12 NOTE — Patient Instructions (Signed)
Continue on venlafaxine 75 mg daily. Call if neck pain worsening again or poor mood control.

## 2014-02-12 NOTE — Assessment & Plan Note (Signed)
Improved on higher dose velafaxine.

## 2014-02-12 NOTE — Progress Notes (Signed)
Pre visit review using our clinic review tool, if applicable. No additional management support is needed unless otherwise documented below in the visit note. 

## 2014-02-12 NOTE — Progress Notes (Signed)
   Subjective:    Patient ID: Cathy Mullins, female    DOB: [birth year], 68 y.o.   MRN: [id number]  HPI 68 year old female presents for 4 week follow up fibromyalgia, cervical radiculitis and depression.  At appt on 01/08/2014 plain X-ray showed IMPRESSION:  Normal alignment with minimally decreased disk space at C5-6.  She was started on prednisone taper.  Also effexor was increased to 75 mg daily.  She reports she feels much better overall from fibro perspective. Some intermittent neck and low back pain. Her daughter has some better home for surgical treatment of brain tumor.  Prednisone helped her neck some. No numbness and weakness in arms. Pain not keeping up at night.  Using tramadol 2-3 times  Day.    Review of Systems  Constitutional: Negative for fever and fatigue.  HENT: Negative for ear pain.   Eyes: Negative for pain.  Respiratory: Negative for chest tightness and shortness of breath.   Cardiovascular: Negative for chest pain, palpitations and leg swelling.  Gastrointestinal: Negative for abdominal pain.  Genitourinary: Negative for dysuria.       Objective:   Physical Exam  Constitutional: She is oriented to person, place, and time. Vital signs are normal. She appears well-developed and well-nourished. She is cooperative.  Non-toxic appearance. She does not appear ill. No distress.  HENT:  Head: Normocephalic.  Right Ear: Hearing, tympanic membrane, external ear and ear canal normal. Tympanic membrane is not erythematous, not retracted and not bulging.  Left Ear: Hearing, tympanic membrane, external ear and ear canal normal. Tympanic membrane is not erythematous, not retracted and not bulging.  Nose: No mucosal edema or rhinorrhea. Right sinus exhibits no maxillary sinus tenderness and no frontal sinus tenderness. Left sinus exhibits no maxillary sinus tenderness and no frontal sinus tenderness.  Mouth/Throat: Uvula is midline, oropharynx is clear and moist and mucous  membranes are normal.  Eyes: Conjunctivae, EOM and lids are normal. Pupils are equal, round, and reactive to light. Lids are everted and swept, no foreign bodies found.  Neck: Trachea normal and normal range of motion. Neck supple. Carotid bruit is not present. No mass and no thyromegaly present.  Cardiovascular: Normal rate, regular rhythm, S1 normal, S2 normal, normal heart sounds, intact distal pulses and normal pulses.  Exam reveals no gallop and no friction rub.   No murmur heard. Pulmonary/Chest: Effort normal and breath sounds normal. Not tachypneic. No respiratory distress. She has no decreased breath sounds. She has no wheezes. She has no rhonchi. She has no rales.  Abdominal: Soft. Normal appearance and bowel sounds are normal. There is no tenderness.  Musculoskeletal:       Cervical back: She exhibits decreased range of motion. She exhibits no tenderness and no bony tenderness.  Neurological: She is alert and oriented to person, place, and time. No cranial nerve deficit or sensory deficit.  Trigger points tender but only mildly  Skin: Skin is warm, dry and intact. No rash noted.  Psychiatric: Her speech is normal and behavior is normal. Judgment and thought content normal. Her mood appears not anxious. Cognition and memory are normal. She does not exhibit a depressed mood.          Assessment & Plan:

## 2014-03-07 ENCOUNTER — Ambulatory Visit (INDEPENDENT_AMBULATORY_CARE_PROVIDER_SITE_OTHER): Payer: Commercial Managed Care - HMO | Admitting: Family Medicine

## 2014-03-07 ENCOUNTER — Encounter: Payer: Self-pay | Admitting: Family Medicine

## 2014-03-07 VITALS — BP 120/78 | HR 62 | Temp 98.3°F | Ht 61.5 in | Wt 176.5 lb

## 2014-03-07 DIAGNOSIS — M65832 Other synovitis and tenosynovitis, left forearm: Secondary | ICD-10-CM

## 2014-03-07 DIAGNOSIS — M65849 Other synovitis and tenosynovitis, unspecified hand: Secondary | ICD-10-CM

## 2014-03-07 DIAGNOSIS — M7712 Lateral epicondylitis, left elbow: Secondary | ICD-10-CM

## 2014-03-07 DIAGNOSIS — M7710 Lateral epicondylitis, unspecified elbow: Secondary | ICD-10-CM

## 2014-03-07 DIAGNOSIS — M65839 Other synovitis and tenosynovitis, unspecified forearm: Secondary | ICD-10-CM

## 2014-03-07 NOTE — Progress Notes (Signed)
Pre visit review using our clinic review tool, if applicable. No additional management support is needed unless otherwise documented below in the visit note. 

## 2014-03-07 NOTE — Progress Notes (Signed)
[address] [place] [address] Phone: [phone] Fax: [phone]  Patient ID: Cathy Mullins MRN: [id number], DOB: [birth year], 68 y.o. Date of Encounter: 03/07/2014  Primary Physician:  Kerby Nora, MD   Chief Complaint: check place on left wrist   Subjective:   History of Present Illness:  Cathy Mullins is a 68 y.o. very pleasant female patient who presents with the following:  Left wrist is swollen a little bit. Also with some pain at the lateral elbow.  Dorsum tenosynovitis: this has been indolent in nature. Pain with ext of wrist. Some puffiness on the dorsum without any in the true wrist. No trauma. Also pain with supination. No pain with pronation. No pain at the ME. No significant shoulder pain. No neck pain.  L 4th trigger finger. No painful.  Cock up wrist splint  Past Medical History, Surgical History, Social History, Family History, Problem List, Medications, and Allergies have been reviewed and updated if relevant.  Review of Systems:  GEN: No fevers, chills. Nontoxic. Primarily MSK c/o today. MSK: Detailed in the HPI GI: tolerating PO intake without difficulty Neuro: No numbness, parasthesias, or tingling associated. Otherwise the pertinent positives of the ROS are noted above.   Objective:   Physical Examination: BP 120/78  Pulse 62  Temp(Src) 98.3 F (36.8 C) (Oral)  Ht 5' 1.5" (1.562 m)  Wt 176 lb 8 oz (80.06 kg)  BMI 32.81 kg/m2   GEN: WDWN, NAD, Non-toxic, Alert & Oriented x 3 HEENT: Atraumatic, Normocephalic.  Ears and Nose: No external deformity. EXTR: No clubbing/cyanosis/edema NEURO: Normal gait.  PSYCH: Normally interactive. Conversant. Not depressed or anxious appearing.  Calm demeanor.   Hand: LEFT Ecchymosis or edema: puffiness just proximal to wrist joint ROM wrist/hand/digits/elbow: full  Carpals, MCP's, digits: NT Distal Ulna and Radius: NT Cysts/nodules: neg Finkelstein's test: neg Snuffbox tenderness: neg Scaphoid  tubercle: NT Hook of Hamate: NT Resisted supination: NT Full composite fist Grip, all digits: 4/5 str Axial load test: neg Atrophy: neg  Hand sensation: intact  LE TTP, supination and ext painful   Radiology: No results found.  Assessment & Plan:   Extensor tenosynovitis of left wrist  Lateral epicondylitis, left  Not terribly Tender. Conservative measured. Cock-up wrist splint for now with otc NSAIDS, tylenol, ice.   New Prescriptions   No medications on file   Modified Medications   Modified Medication Previous Medication   METFORMIN (GLUCOPHAGE-XR) 500 MG 24 HR TABLET metFORMIN (GLUCOPHAGE-XR) 500 MG 24 hr tablet      Take 1 tablet (500 mg total) by mouth 2 (two) times daily.    Take 1 tablet (500 mg total) by mouth 2 (two) times daily.   No orders of the defined types were placed in this encounter.   Follow-up: No Follow-up on file. Unless noted above, the patient is to follow-up if symptoms worsen. Red flags were reviewed with the patient.  Signed,  Elpidio Galea. Jahquez Steffler, MD, CAQ Sports Medicine   Discontinued Medications   No medications on file   Current Medications at Discharge:   Medication List       This list is accurate as of: 03/07/14 11:59 PM.  Always use your most recent med list.               ACCU-CHEK AVIVA device  Use to check blood sugars two times a day.  Dx:250.00 Non-Insulin     ACCU-CHEK SOFTCLIX LANCETS lancets  Use to check blood sugar two times a  day.  Dx:250.00 Non-Insulin     acetaminophen 325 MG tablet  Commonly known as:  TYLENOL  Take 650 mg by mouth daily as needed.     albuterol 108 (90 BASE) MCG/ACT inhaler  Commonly known as:  PROVENTIL HFA;VENTOLIN HFA  2 puffs every 4 hours as needed for wheezing.     aspirin 81 MG tablet  Take 81 mg by mouth as needed.     calcium carbonate 500 MG chewable tablet  Commonly known as:  TUMS - dosed in mg elemental calcium  Chew 1 tablet by mouth as needed.     CENTRUM ADULTS  PO  Take 1 tablet by mouth daily.     clobetasol 0.05 % topical foam  Commonly known as:  OLUX  Apply 1 application topically 2 (two) times daily as needed.     clobetasol ointment 0.05 %  Commonly known as:  TEMOVATE  Apply topically. Use 2 to 3 times weekly     diclofenac sodium 1 % Gel  Commonly known as:  VOLTAREN  Apply 1 application topically 4 (four) times daily as needed.     glucose blood test strip  Commonly known as:  ACCU-CHEK AVIVA PLUS  Use to check blood sugar two times a day.  Dx:250.00 Non-Insulin     ibuprofen 200 MG tablet  Commonly known as:  ADVIL,MOTRIN  Take 200 mg by mouth as needed.     lisinopril 10 MG tablet  Commonly known as:  PRINIVIL,ZESTRIL  TAKE ONE TABLET BY MOUTH ONCE DAILY.     meloxicam 7.5 MG tablet  Commonly known as:  MOBIC  Take 1 tablet (7.5 mg total) by mouth daily.     metFORMIN 500 MG 24 hr tablet  Commonly known as:  GLUCOPHAGE-XR  Take 1 tablet (500 mg total) by mouth 2 (two) times daily.     metoprolol 50 MG tablet  Commonly known as:  LOPRESSOR  TAKE ONE TABLET BY MOUTH TWICE DAILY     neomycin-bacitracin-polymyxin ointment  Commonly known as:  NEOSPORIN  Apply 1 application topically as needed for wound care. apply to eye     omeprazole 20 MG capsule  Commonly known as:  PRILOSEC  Take 1 capsule (20 mg total) by mouth 2 (two) times daily before a meal.     timolol 0.5 % ophthalmic solution  Commonly known as:  TIMOPTIC  Place 1 drop into both eyes daily.     traMADol 50 MG tablet  Commonly known as:  ULTRAM  Take 1 tablet (50 mg total) by mouth 4 (four) times daily.     venlafaxine XR 75 MG 24 hr capsule  Commonly known as:  EFFEXOR-XR  Take 1 capsule (75 mg total) by mouth daily with breakfast.     XALATAN 0.005 % ophthalmic solution  Generic drug:  latanoprost  Place 1 drop into both eyes at bedtime.

## 2014-03-08 ENCOUNTER — Other Ambulatory Visit: Payer: Self-pay | Admitting: *Deleted

## 2014-03-08 MED ORDER — METFORMIN HCL ER 500 MG PO TB24: 500.0000 mg | ORAL_TABLET | Freq: Two times a day (BID) | ORAL | Status: DC

## 2014-03-20 ENCOUNTER — Other Ambulatory Visit: Payer: Self-pay

## 2014-03-20 MED ORDER — DICLOFENAC SODIUM 1 % TD GEL
2.0000 g | Freq: Four times a day (QID) | TRANSDERMAL | Status: DC | PRN
Start: 2014-03-20 — End: 2015-06-20

## 2014-03-20 NOTE — Telephone Encounter (Signed)
I am confused,   Same sig she should have gotten 3 tubes a month for hand OA.  Ok to refill 11 time.

## 2014-03-20 NOTE — Telephone Encounter (Signed)
Pt request refill on diclofenac 1 % gel for lt hand and arm pain due to arthritis to [facility]. Pt is out of med.Please advise.

## 2014-03-20 NOTE — Telephone Encounter (Signed)
Arbie Cookey notified prescription has been sent to her pharmacy.

## 2014-04-02 ENCOUNTER — Other Ambulatory Visit: Payer: Self-pay

## 2014-04-02 MED ORDER — MELOXICAM 7.5 MG PO TABS: 7.5000 mg | ORAL_TABLET | Freq: Every day | ORAL | Status: DC

## 2014-04-02 NOTE — Telephone Encounter (Signed)
Pt left v/m requesting refill meloxicam to [facility] mail order [facility]; was previously prescribed by an arthritis doctor but that doctor moved to [place].Please advise.

## 2014-05-29 ENCOUNTER — Other Ambulatory Visit: Payer: Self-pay | Admitting: *Deleted

## 2014-05-29 MED ORDER — LISINOPRIL 10 MG PO TABS: ORAL_TABLET | ORAL | Status: DC

## 2014-05-29 MED ORDER — METOPROLOL TARTRATE 50 MG PO TABS: ORAL_TABLET | ORAL | Status: DC

## 2014-05-29 NOTE — Telephone Encounter (Signed)
Ok to refill 

## 2014-05-30 MED ORDER — TRAMADOL HCL 50 MG PO TABS: 50.0000 mg | ORAL_TABLET | Freq: Four times a day (QID) | ORAL | Status: DC

## 2014-05-30 NOTE — Telephone Encounter (Signed)
Tramadol prescription faxed to [facility] [phone].

## 2014-06-03 NOTE — Telephone Encounter (Signed)
Pt checking on status of lisinopril and tramadol refill; advised sent to [facility]. Pt will contact [facility].

## 2014-06-25 ENCOUNTER — Other Ambulatory Visit: Payer: Medicare Other

## 2014-07-02 ENCOUNTER — Encounter: Payer: Medicare Other | Admitting: Family Medicine

## 2014-07-09 ENCOUNTER — Encounter: Payer: Self-pay | Admitting: Family Medicine

## 2014-07-09 ENCOUNTER — Ambulatory Visit (INDEPENDENT_AMBULATORY_CARE_PROVIDER_SITE_OTHER): Payer: Commercial Managed Care - HMO | Admitting: Family Medicine

## 2014-07-09 VITALS — BP 124/72 | HR 59 | Temp 98.0°F | Ht 61.5 in | Wt 173.0 lb

## 2014-07-09 DIAGNOSIS — Z7189 Other specified counseling: Secondary | ICD-10-CM

## 2014-07-09 DIAGNOSIS — Z23 Encounter for immunization: Secondary | ICD-10-CM

## 2014-07-09 DIAGNOSIS — F331 Major depressive disorder, recurrent, moderate: Secondary | ICD-10-CM

## 2014-07-09 DIAGNOSIS — E119 Type 2 diabetes mellitus without complications: Secondary | ICD-10-CM

## 2014-07-09 DIAGNOSIS — Z0000 Encounter for general adult medical examination without abnormal findings: Secondary | ICD-10-CM

## 2014-07-09 DIAGNOSIS — M797 Fibromyalgia: Secondary | ICD-10-CM

## 2014-07-09 DIAGNOSIS — I10 Essential (primary) hypertension: Secondary | ICD-10-CM

## 2014-07-09 LAB — HEMOGLOBIN A1C: Hgb A1c MFr Bld: 6.7 % — ABNORMAL HIGH (ref 4.6–6.5)

## 2014-07-09 MED ORDER — RETAPAMULIN 1 % EX OINT: TOPICAL_OINTMENT | Freq: Two times a day (BID) | CUTANEOUS | Status: DC

## 2014-07-09 NOTE — Addendum Note (Signed)
Addended by: Marchia Bond on: 07/09/2014 02:39 PM   Modules accepted: Orders, SmartSet

## 2014-07-09 NOTE — Progress Notes (Signed)
HPI  I have personally reviewed the Medicare Annual Wellness questionnaire and have noted  1. The patient's medical and social history  2. Their use of alcohol, tobacco or illicit drugs  3. Their current medications and supplements  4. The patient's functional ability including ADL's, fall risks, home safety risks and hearing or visual  impairment.  5. Diet and physical activities  6. Evidence for depression or mood disorders  The patients weight, height, BMI and visual acuity have been recorded in the chart  I have made referrals, counseling and provided education to the patient based review of the above and I have provided the pt with a written personalized care plan for preventive services.    Fibromyalgia: Tolerable control at this point. She is using tramadol at night only.  Her mood is much better on venlafaxine., She eels that she is losing hair loss from this med.   Her daughter in now doing better but still in pain.  husband in better mood. Recent 50 year anniversary.  Hypertension: At goal <130/80 on lisinopril, metoprolol, propranolol prn.  BP Readings from Last 3 Encounters:  07/09/14 124/72  03/07/14 120/78  02/12/14 126/66  Using medication without problems or lightheadedness: see below  Chest pain with exertion:None  Edema:None  Tsao of breath:stable Average home BPs: not checking lately. Other issues:   Elevated Cholesterol: Previously inadequate control, goal <70 given TIA On no medication. Poor control tri and LDL far from goal.  DUE FOR RE-EVAL. Never tried flax seed oil, does not tolerated red yeast rice, SE to several statin meds...severe myalgia.  Trial of welchol ... She had trouble taking due to thickness and cause constipation. She refuses to try tablet form. Lab Results  Component Value Date   CHOL 285* 09/28/2013   HDL 54.90 09/28/2013   LDLCALC See Comment mg/dL 07/17/2010   LDLDIRECT 194.6 09/28/2013   TRIG 260.0* 09/28/2013    CHOLHDL 5 09/28/2013  Diet compliance: Moderate.  Exercise minimal. Other complaints: Hx of TIA. off ASA   Diabetes: At goal on Glucophage in past, due for re-eval. Lab Results  Component Value Date   HGBA1C 6.5 06/25/2013  Using medications without difficulties: None  Hypoglycemic episodes:None  Hyperglycemic episodes:None  Feet problems:None  Blood Sugars averaging:100-120 eye exam within last year:yes  Wt Readings from Last 3 Encounters:  07/09/14 173 lb (78.472 kg)  03/07/14 176 lb 8 oz (80.06 kg)  02/12/14 179 lb 8 oz (81.421 kg)                She has only had one recent episode of palpitations in last few months.. Secondary to caffeine.   reports that she quit smoking about 25 years ago. She has never used smokeless tobacco. She reports that she does not drink alcohol or use illicit drugs.   Review of Systems  Constitutional: Negative for fever, fatigue and unexpected weight change.  HENT: Negative for ear pain, congestion, sore throat, sneezing, trouble swallowing and sinus pressure.  Eyes: Negative for pain and itching.  Respiratory: Negative for cough, shortness of breath and wheezing.  Cardiovascular: Negative for chest pain, palpitations and leg swelling.  Gastrointestinal: Negative for nausea, abdominal pain, diarrhea, constipation and blood in stool.  Genitourinary: Negative for dysuria, hematuria, vaginal discharge, difficulty urinating and menstrual problem.  Skin: Negative for rash.  Neurological: Negative for syncope, weakness, light-headedness, numbness and headaches.  Psychiatric/Behavioral: Negative for confusion and dysphoric mood. The patient is not nervous/anxious.  Mole on right arm dull Appearing ,  mole under left breast is itchy.  Objective:   Physical Exam  Constitutional: Vital signs are normal. She appears well-developed and well-nourished. She is cooperative. Non-toxic appearance. She does not appear ill. No distress.   HENT:  Head: Normocephalic.  Right Ear: Hearing, tympanic membrane, external ear and ear canal normal.  Left Ear: Hearing, tympanic membrane, external ear and ear canal normal.  Nose: Nose normal.  Eyes: Conjunctivae normal, EOM and lids are normal. Pupils are equal, round, and reactive to light. No foreign bodies found.  Neck: Trachea normal and normal range of motion. Neck supple. Carotid bruit is not present. No mass and no thyromegaly present.  Cardiovascular: Normal rate, regular rhythm, S1 normal, S2 normal, normal heart sounds and intact distal pulses. Exam reveals no gallop.  No murmur heard.  Pulmonary/Chest: Effort normal and breath sounds normal. No respiratory distress. She has no wheezes. She has no rhonchi. She has no rales.  Abdominal: Soft. Normal appearance and bowel sounds are normal. She exhibits no distension, no fluid wave, no abdominal bruit and no mass. There is no hepatosplenomegaly. There is no tenderness. There is no rebound, no guarding and no CVA tenderness. No hernia.  Genitourinary: No breast swelling, tenderness, discharge or bleeding. Pelvic exam was performed with patient supine.  Lymphadenopathy:  She has no cervical adenopathy.  She has no axillary adenopathy.  Neurological: She is alert. She has normal strength. No cranial nerve deficit or sensory deficit.  Skin: Skin is warm, dry and intact. No rash noted.  Psychiatric: Her speech is normal and behavior is normal. Judgment normal. Her mood appears not anxious. Cognition and memory are normal. She does not exhibit a depressed mood.   Diabetic foot exam: Normal inspection No skin breakdown.. Small crack on left great toe base.Marland Kitchen No erythema... Applying neosporin and bandaid No calluses  Normal DP pulses Normal sensation to light touch and monofilament Nails normal  Assessment & Plan:   Medicare wellness: The patient's preventative maintenance and recommended screening tests for an annual  wellness exam were reviewed in full today.  Brought up to date unless services declined.  Counselled on the importance of diet, exercise, and its role in overall health and mortality.  The patient's FH and SH was reviewed, including their home life, tobacco status, and drug and alcohol status.   DXA: 05/2011 nml, repeat in 5 years Mammo: nml 1/ 2013, mother with breast cancer, due for repeat PAP/DVE: not indicated s/p abdominal hysterectomy, right ovary remaining for noncancerous reason.. Sees Dr. Kennon Rounds for GYN for lichen sclerosis but has not seen in a while. No family history of ovarian cancer. She has chosen to stop DVEs.  Vaccines:Up to date with TDap PNA and planning shingles vaccine. Flu and prevnar due today.  Colon cancer screening: colonoscopy  Nml, blood in stool likely due to hemorrhoids. Dr> Deatra Ina, repeat in 10 years.

## 2014-07-09 NOTE — Addendum Note (Signed)
Addended by: Eliezer Lofts E on: 07/09/2014 05:22 PM   Modules accepted: Orders, SmartSet

## 2014-07-09 NOTE — Assessment & Plan Note (Signed)
Improved, can try to wean off venlafaxine if continues to remain stable.

## 2014-07-09 NOTE — Patient Instructions (Addendum)
Stop at lab on way out.  Work on [employer] , regular exercise and keep working on weight loss.  Schedule mammogram on own.

## 2014-07-09 NOTE — Assessment & Plan Note (Signed)
Well controlled. Continue current medication.  

## 2014-07-09 NOTE — Assessment & Plan Note (Signed)
Well controlled on tramadol nightly.  Much improved from previously.

## 2014-07-09 NOTE — Assessment & Plan Note (Signed)
Previously well controlled, due for re-eval. Encouraged exercise, weight loss, healthy eating habits.

## 2014-07-09 NOTE — Addendum Note (Signed)
Addended by: Carter Kitten on: 07/09/2014 02:14 PM   Modules accepted: Orders

## 2014-07-09 NOTE — Progress Notes (Signed)
Pre visit review using our clinic review tool, if applicable. No additional management support is needed unless otherwise documented below in the visit note. 

## 2014-07-10 ENCOUNTER — Telehealth: Payer: Self-pay | Admitting: Family Medicine

## 2014-07-10 LAB — COMPREHENSIVE METABOLIC PANEL
ALT: 16 U/L (ref 0–35)
AST: 22 U/L (ref 0–37)
Albumin: 4.1 g/dL (ref 3.5–5.2)
Alkaline Phosphatase: 65 U/L (ref 39–117)
BUN: 17 mg/dL (ref 6–23)
CALCIUM: 9.2 mg/dL (ref 8.4–10.5)
CHLORIDE: 100 meq/L (ref 96–112)
CO2: 27 mEq/L (ref 19–32)
Creatinine, Ser: 1.1 mg/dL (ref 0.4–1.2)
GFR: 54.16 mL/min — ABNORMAL LOW (ref >60.00)
Glucose, Bld: 105 mg/dL — ABNORMAL HIGH (ref 70–99)
Potassium: 4.9 mEq/L (ref 3.5–5.1)
Sodium: 133 mEq/L — ABNORMAL LOW (ref 135–145)
TOTAL PROTEIN: 7.4 g/dL (ref 6.0–8.3)
Total Bilirubin: 0.5 mg/dL (ref 0.2–1.2)

## 2014-07-10 LAB — LIPID PANEL
Cholesterol: 254 mg/dL — ABNORMAL HIGH (ref 0–200)
HDL: 46.7 mg/dL (ref >39.00)
NonHDL: 207.3
TRIGLYCERIDES: 225 mg/dL — AB (ref 0.0–149.0)
Total CHOL/HDL Ratio: 5
VLDL: 45 mg/dL — ABNORMAL HIGH (ref 0.0–40.0)

## 2014-07-10 LAB — LDL CHOLESTEROL, DIRECT: LDL DIRECT: 162.5 mg/dL

## 2014-07-10 NOTE — Telephone Encounter (Signed)
emmi emailed °

## 2014-07-21 ENCOUNTER — Other Ambulatory Visit: Payer: Self-pay | Admitting: Family Medicine

## 2014-07-22 MED ORDER — METOPROLOL TARTRATE 50 MG PO TABS: 50.0000 mg | ORAL_TABLET | Freq: Two times a day (BID) | ORAL | Status: DC

## 2014-07-22 NOTE — Telephone Encounter (Signed)
Pt request refill metoprolol tartrate 50 mg # 180 instead of # 60 sent to [facility] mail order. Advised pt done.

## 2014-07-22 NOTE — Addendum Note (Signed)
Addended by: Helene Shoe on: 07/22/2014 01:47 PM   Modules accepted: Orders

## 2014-08-14 ENCOUNTER — Telehealth: Payer: Self-pay

## 2014-08-14 NOTE — Telephone Encounter (Signed)
For her reflux  And indigestionshe can start prilosec 40 mg daily ( 2  Tabs of OTC 20 mg).  She needs to stop meloxicam which could be irritating stomach Can try probiotic to help with diarrhea/constipaiton.  If bleeding not stopping with this, she needs appt to be seen.

## 2014-08-14 NOTE — Telephone Encounter (Signed)
Pt left v/m; pt seen annual exam on 07/09/14; pt had colonoscopy which was normal and blood in stool might be due to hemorrhoids; now pt having a lot of indigestion, heart burn and nausea; pt going back and forth between constipation and  Mild diarrhea, pt having bright red blood;? Due to hemorrhoids or ulcer. Pt not having stomach pain but some nights pt has to sit up in chair to sleep due to reflux. Pt wants to know if can suggest what to do without appt since seen 07/2014. Pt also request refill venalfaxine to [facility] order. From 07/09/14 note possible wean off venlafaxine.Please advise about refill.

## 2014-08-15 NOTE — Telephone Encounter (Signed)
Cathy Mullins notified as instructed by telephone.  She states she has some Prilosec at home and will start that.  She did try stopping the meloxicam but her hip started hurting her so bad she could hardly walk so she had to restart taking it again.  She will try the probiotic and if bleeding does not stop with this, she will make an appointment.  Cathy Mullins also inquired about her refill on Venlafaxine.  She states she tried to ween herself off but her body did not do well with this and would like to continue on this medication.  Wants refills sent in to [facility].  Ok to refill?

## 2014-08-16 MED ORDER — VENLAFAXINE HCL ER 75 MG PO CP24: 75.0000 mg | ORAL_CAPSULE | Freq: Every day | ORAL | Status: DC

## 2014-08-16 NOTE — Telephone Encounter (Signed)
Left message for Cathy Mullins to return my call. 

## 2014-08-16 NOTE — Telephone Encounter (Signed)
Spoke with Cathy Mullins.  She states she has stopped the Meloxicam for now.  Medication removed from medication list per Dr. Diona Browner. Also advised refills for her venlafaxine has been sent in to [facility].

## 2014-08-16 NOTE — Telephone Encounter (Signed)
Okay to refill. Strongly recommend stopping meloxicam as may be causing ulcer that may cause life threatening bleed. Take off md list.

## 2014-08-26 ENCOUNTER — Other Ambulatory Visit: Payer: Self-pay | Admitting: Family Medicine

## 2014-08-27 NOTE — Telephone Encounter (Signed)
Pt request status of refill lisinopril; advised pt sent to [facility] # [phone] x 1 earlier this morning. Pt voiced understanding.

## 2014-09-22 ENCOUNTER — Other Ambulatory Visit: Payer: Self-pay | Admitting: Family Medicine

## 2014-09-23 ENCOUNTER — Other Ambulatory Visit: Payer: Self-pay

## 2014-09-23 MED ORDER — METFORMIN HCL ER 500 MG PO TB24: 500.0000 mg | ORAL_TABLET | Freq: Two times a day (BID) | ORAL | Status: DC

## 2014-09-23 NOTE — Telephone Encounter (Signed)
Pt is out of metformin and request 90 day refills to [facility] and 2 week supply to [facility]; advised pt done.

## 2014-10-25 ENCOUNTER — Ambulatory Visit (INDEPENDENT_AMBULATORY_CARE_PROVIDER_SITE_OTHER): Payer: Commercial Managed Care - HMO | Admitting: Family Medicine

## 2014-10-25 ENCOUNTER — Encounter: Payer: Self-pay | Admitting: Family Medicine

## 2014-10-25 VITALS — BP 116/68 | HR 61 | Temp 98.4°F | Wt 174.0 lb

## 2014-10-25 DIAGNOSIS — J441 Chronic obstructive pulmonary disease with (acute) exacerbation: Secondary | ICD-10-CM | POA: Diagnosis not present

## 2014-10-25 MED ORDER — AZITHROMYCIN 250 MG PO TABS: ORAL_TABLET | ORAL | Status: DC

## 2014-10-25 MED ORDER — PREDNISONE 20 MG PO TABS: ORAL_TABLET | ORAL | Status: DC

## 2014-10-25 NOTE — Patient Instructions (Signed)
Complete course of antibiotics. Start prednisone taper Albuterol as needed. Tramadol for pain in chest wall. Call if not improving in 48-72, got to ER if severe shortness of breath.

## 2014-10-25 NOTE — Assessment & Plan Note (Signed)
Given change with sputum.. Treat with antibiotics  Prednisone taper and albuterol prn.

## 2014-10-25 NOTE — Progress Notes (Signed)
Pre visit review using our clinic review tool, if applicable. No additional management support is needed unless otherwise documented below in the visit note. 

## 2014-10-25 NOTE — Progress Notes (Signed)
   Subjective:    Patient ID: Cathy Mullins, female    DOB: 10/25/1945, 69 y.o.   MRN: [id number]  Cough The current episode started in the past 7 days. The problem has been gradually worsening. The cough is productive of purulent sputum and productive of blood-tinged sputum. Associated symptoms include chest pain, chills, a fever, myalgias, nasal congestion, a sore throat, shortness of breath and wheezing. Pertinent negatives include no ear congestion, ear pain or headaches. Associated symptoms comments: ear itching,  sinus pain and pressure  purulent nasal discharge  100 F at home yesterday. The symptoms are aggravated by lying down (cough keeping her up at night). Risk factors: non smoker. She has tried a beta-agonist inhaler (robitussin, allergy pill) for the symptoms. The treatment provided mild relief. Her past medical history is significant for COPD. ? reactive ariways   She has been wheezing x several weeks.   Review of Systems  Constitutional: Positive for fever and chills.  HENT: Positive for sore throat. Negative for ear pain.   Respiratory: Positive for cough, shortness of breath and wheezing.   Cardiovascular: Positive for chest pain.  Musculoskeletal: Positive for myalgias.  Neurological: Negative for headaches.       Objective:   Physical Exam  Constitutional: Vital signs are normal. She appears well-developed and well-nourished. She is cooperative.  Non-toxic appearance. She does not appear ill. No distress.  HENT:  Head: Normocephalic.  Right Ear: Hearing, tympanic membrane, external ear and ear canal normal. Tympanic membrane is not erythematous, not retracted and not bulging.  Left Ear: Hearing, tympanic membrane, external ear and ear canal normal. Tympanic membrane is not erythematous, not retracted and not bulging.  Nose: Mucosal edema and rhinorrhea present. Right sinus exhibits no maxillary sinus tenderness and no frontal sinus tenderness. Left sinus exhibits no  maxillary sinus tenderness and no frontal sinus tenderness.  Mouth/Throat: Uvula is midline, oropharynx is clear and moist and mucous membranes are normal.  Eyes: Conjunctivae, EOM and lids are normal. Pupils are equal, round, and reactive to light. Lids are everted and swept, no foreign bodies found.  Neck: Trachea normal and normal range of motion. Neck supple. Carotid bruit is not present. No thyroid mass and no thyromegaly present.  Cardiovascular: Normal rate, regular rhythm, S1 normal, S2 normal, normal heart sounds, intact distal pulses and normal pulses.  Exam reveals no gallop and no friction rub.   No murmur heard. Pulmonary/Chest: Effort normal. No tachypnea. No respiratory distress. She has no decreased breath sounds. She has wheezes. She has no rhonchi. She has no rales. Chest wall is not dull to percussion. She exhibits tenderness. She exhibits no mass.  Scattered wheeze  Severe pain with deep breaths and coughing  Neurological: She is alert.  Skin: Skin is warm, dry and intact. No rash noted.  Psychiatric: Her speech is normal and behavior is normal. Judgment normal. Her mood appears not anxious. Cognition and memory are normal. She does not exhibit a depressed mood.          Assessment & Plan:

## 2014-11-15 DIAGNOSIS — H4011X1 Primary open-angle glaucoma, mild stage: Secondary | ICD-10-CM | POA: Diagnosis not present

## 2014-11-15 LAB — HM DIABETES EYE EXAM

## 2014-11-19 ENCOUNTER — Encounter: Payer: Self-pay | Admitting: Family Medicine

## 2014-12-02 ENCOUNTER — Other Ambulatory Visit: Payer: Self-pay | Admitting: Family Medicine

## 2014-12-02 NOTE — Telephone Encounter (Signed)
Last office visit 10/25/2014.  Last refilled 05/30/2014 for #120 with 3 refills.  Ok to refill?

## 2014-12-03 ENCOUNTER — Encounter: Payer: Self-pay | Admitting: Family Medicine

## 2014-12-03 ENCOUNTER — Ambulatory Visit (INDEPENDENT_AMBULATORY_CARE_PROVIDER_SITE_OTHER): Payer: Commercial Managed Care - HMO | Admitting: Family Medicine

## 2014-12-03 ENCOUNTER — Ambulatory Visit (INDEPENDENT_AMBULATORY_CARE_PROVIDER_SITE_OTHER)
Admission: RE | Admit: 2014-12-03 | Discharge: 2014-12-03 | Disposition: A | Discharge status: Home / Self Care | Payer: Commercial Managed Care - HMO | Source: Ambulatory Visit | Attending: Family Medicine | Admitting: Family Medicine

## 2014-12-03 VITALS — BP 120/70 | HR 60 | Temp 98.8°F | Ht 61.5 in | Wt 174.5 lb

## 2014-12-03 DIAGNOSIS — K219 Gastro-esophageal reflux disease without esophagitis: Secondary | ICD-10-CM | POA: Diagnosis not present

## 2014-12-03 DIAGNOSIS — R05 Cough: Secondary | ICD-10-CM

## 2014-12-03 DIAGNOSIS — M4806 Spinal stenosis, lumbar region: Secondary | ICD-10-CM

## 2014-12-03 DIAGNOSIS — J449 Chronic obstructive pulmonary disease, unspecified: Secondary | ICD-10-CM | POA: Diagnosis not present

## 2014-12-03 DIAGNOSIS — R053 Chronic cough: Secondary | ICD-10-CM

## 2014-12-03 DIAGNOSIS — M48061 Spinal stenosis, lumbar region without neurogenic claudication: Secondary | ICD-10-CM

## 2014-12-03 MED ORDER — BENZONATATE 200 MG PO CAPS: 200.0000 mg | ORAL_CAPSULE | Freq: Two times a day (BID) | ORAL | Status: DC | PRN

## 2014-12-03 MED ORDER — TRAMADOL HCL 50 MG PO TABS: ORAL_TABLET | ORAL | Status: DC

## 2014-12-03 NOTE — Patient Instructions (Addendum)
We will call you with X-ray results. Can use benzonatate   And albuterol for cough as needed. Increase prilosec OTC 2 tabs of 20 mg daily x 2-4 week. Call if cough not better in 2 weeks for further evaluation, possibly ENT eval.  Stop at front desk for spine referral.

## 2014-12-03 NOTE — Progress Notes (Signed)
   Subjective:    Patient ID: Cathy Mullins, female    DOB: [birth year], 69 y.o.   MRN: [id number]  HPI  69 year old female with history of  COPD, mild, HTN, DM presents with chronic cough x 3 months.   She was seen in 10/2014 Dx with  COPD exac.. Was productive purulent sputums.  Completed azithromycin, but did not tolerated prednisone ( only took 1st dose.. Had SE)  Now back to dry or clear sputum mucus. Less cough but continued. Few days ago coughed pinkish brown sputum.  Keeping her up at night. Comes back with lying flat, better with sitting up.  No shortness of breath, occ wheezing, ratlting with breathing.  Albuterol helps a little temporarily.  Has chronic issues with heartburn off and on. Using prilosec as needed.  No sneeze itchy eye, no allergies this time of year.  Hx of smoking, 20-30 pack year history. Quit in 1990.  Family history of chronic cough.  Spinal stenosis.Marland Kitchen acting up.  Previously saw Dr. Arline Asp, Dr. Derry Lory. [place].  Needs referral to another MD.    Review of Systems  Constitutional: Negative for fever and fatigue.  HENT: Positive for rhinorrhea. Negative for ear pain, postnasal drip, sinus pressure and sore throat.   Respiratory: Positive for cough and wheezing. Negative for shortness of breath.   Cardiovascular: Negative for chest pain, palpitations and leg swelling.       Objective:   Physical Exam  Constitutional: Vital signs are normal. She appears well-developed and well-nourished. She is cooperative.  Non-toxic appearance. She does not appear ill. No distress.  HENT:  Head: Normocephalic.  Right Ear: Hearing, tympanic membrane, external ear and ear canal normal.  Left Ear: Hearing, tympanic membrane, external ear and ear canal normal.  Nose: Nose normal.  Eyes: Conjunctivae, EOM and lids are normal. Pupils are equal, round, and reactive to light. Lids are everted and swept, no foreign bodies found.  Neck: Trachea normal and normal  range of motion. Neck supple. Carotid bruit is not present. No thyroid mass and no thyromegaly present.  Cardiovascular: Normal rate, regular rhythm, S1 normal, S2 normal, normal heart sounds and intact distal pulses.  Exam reveals no gallop.   No murmur heard. Pulmonary/Chest: Effort normal and breath sounds normal. No respiratory distress. She has no wheezes. She has no rhonchi. She has no rales.  Abdominal: Soft. Normal appearance and bowel sounds are normal. She exhibits no distension, no fluid wave, no abdominal bruit and no mass. There is no hepatosplenomegaly. There is no tenderness. There is no rebound, no guarding and no CVA tenderness. No hernia.  Lymphadenopathy:    She has no cervical adenopathy.    She has no axillary adenopathy.  Neurological: She is alert. She has normal strength. No cranial nerve deficit or sensory deficit.  Skin: Skin is warm, dry and intact. No rash noted.  Psychiatric: Her speech is normal and behavior is normal. Judgment normal. Her mood appears not anxious. Cognition and memory are normal. She does not exhibit a depressed mood.          Assessment & Plan:

## 2014-12-03 NOTE — Assessment & Plan Note (Signed)
Eval with CXR given smoking history. No current sign of infection of COPD exacerbation. ? Post infectious bronchospsam causing coughing fits? Poorly controlled reflux may be causing issue.. Increase to omeprazole 40 mg daioly x 2 week.  no clear sign of allergy source.

## 2014-12-03 NOTE — Progress Notes (Signed)
Pre visit review using our clinic review tool, if applicable. No additional management support is needed unless otherwise documented below in the visit note. 

## 2014-12-03 NOTE — Telephone Encounter (Addendum)
Rx printed so I can fax to [employer].  Rx faxed to [facility] at [phone].

## 2014-12-03 NOTE — Assessment & Plan Note (Signed)
Pt request referral to back speicalist for spinal injection such she has had multiple times in the past. Previously saw Dr, Kellie Simmering.Marland Kitchen He is no longer in the area. ? Dr. Ninfa Linden previously did the steroid injecitons.

## 2014-12-03 NOTE — Addendum Note (Signed)
Addended by: Carter Kitten on: 12/03/2014 09:29 AM   Modules accepted: Orders

## 2014-12-03 NOTE — Assessment & Plan Note (Signed)
Inadequate control per history.  Increase omeprazole to 40 mg daily for 2-4 weeks.

## 2014-12-05 ENCOUNTER — Telehealth: Payer: Self-pay | Admitting: *Deleted

## 2014-12-05 NOTE — Telephone Encounter (Signed)
Requesting refill on omeprazole (no longer on med list).  Okay to refill?

## 2014-12-06 MED ORDER — OMEPRAZOLE 20 MG PO CPDR: 20.0000 mg | DELAYED_RELEASE_CAPSULE | Freq: Every day | ORAL | Status: DC

## 2014-12-06 NOTE — Telephone Encounter (Signed)
Left message for Cathy Mullins that her refills on omeprazole has been sent to her pharmacy.

## 2014-12-06 NOTE — Telephone Encounter (Signed)
Okay to refill? 

## 2014-12-17 ENCOUNTER — Telehealth: Payer: Self-pay | Admitting: Family Medicine

## 2014-12-17 DIAGNOSIS — R05 Cough: Secondary | ICD-10-CM

## 2014-12-17 DIAGNOSIS — R053 Chronic cough: Secondary | ICD-10-CM

## 2014-12-17 NOTE — Telephone Encounter (Signed)
Pt called to let you know she is not doing any better.  She still has cough Wants referral to ent  Cannot go until after 01/20/15  cannot go 6/28-6/29

## 2014-12-17 NOTE — Telephone Encounter (Signed)
Spoke to pt. She is aware [facility] will be contacting her directly to schedule appt.  Referral faxed 12/17/14

## 2014-12-17 NOTE — Telephone Encounter (Signed)
Referral sent 

## 2014-12-23 ENCOUNTER — Telehealth: Payer: Self-pay | Admitting: *Deleted

## 2014-12-23 NOTE — Telephone Encounter (Signed)
FYI Patient left a [place]icemail [place] home until next Monday and does not have cell phone service. Patient stated that she had severe hip and back pain last night and was screaming because it was so bad. Patient stated that she has an appointment scheduled with an orthopedist the end of June and wants this moved up to next week. Spoke to Allison/referral coordinator and was advised that the patient should call the orthopedist office and see if they can move her up or put her on a cancellation list. Unable to leave a message on cell phone because voicemail has not been set up, let a detailed message on patient's answering machine at home number with information.

## 2014-12-23 NOTE — Telephone Encounter (Signed)
Noted  

## 2014-12-30 ENCOUNTER — Telehealth: Payer: Self-pay

## 2014-12-30 DIAGNOSIS — R05 Cough: Secondary | ICD-10-CM | POA: Diagnosis not present

## 2014-12-30 DIAGNOSIS — J301 Allergic rhinitis due to pollen: Secondary | ICD-10-CM | POA: Diagnosis not present

## 2014-12-30 MED ORDER — LOSARTAN POTASSIUM 50 MG PO TABS: 50.0000 mg | ORAL_TABLET | Freq: Every day | ORAL | Status: DC

## 2014-12-30 NOTE — Telephone Encounter (Signed)
[name] notified that Dr. Diona Browner is okay with her stopping the Lisinopril and starting Losartan 50 mg one tablet daily.  Rx has been sent to [facility].

## 2014-12-30 NOTE — Telephone Encounter (Signed)
Cathy Mullins With Dr Cathy Mullins office ENT called; pt is on lisinopril; Dr Cathy Mullins is aware pt has been taking lisinopril for long period of time but pt continues with cough and Dr Cathy Mullins wants to know if lisinopril could be changed to Losartan due to chronic cough. Please contact pt [phone] with answer; [facility] [place]. Cathy Mullins does not need cb.

## 2014-12-30 NOTE — Telephone Encounter (Signed)
It would be absolutely fine to try trial off lisinopril. She has been on  For years but can certainly cause cough. Change to losartan 50 mg daily. Call in Rx please #[phone].

## 2015-01-08 ENCOUNTER — Other Ambulatory Visit: Payer: Commercial Managed Care - HMO

## 2015-01-14 ENCOUNTER — Ambulatory Visit: Payer: Commercial Managed Care - HMO | Admitting: Family Medicine

## 2015-01-21 ENCOUNTER — Ambulatory Visit: Payer: Commercial Managed Care - HMO | Admitting: Family Medicine

## 2015-01-23 ENCOUNTER — Other Ambulatory Visit: Payer: Self-pay | Admitting: Family Medicine

## 2015-01-27 ENCOUNTER — Telehealth: Payer: Self-pay | Admitting: *Deleted

## 2015-01-27 ENCOUNTER — Encounter: Payer: Self-pay | Admitting: Family Medicine

## 2015-01-27 NOTE — Telephone Encounter (Signed)
F/u mammogram call. Pt said she doesn't get mammogram anymore unless she finds something abnormal then she will go back but until then she declined

## 2015-02-03 ENCOUNTER — Other Ambulatory Visit: Payer: Self-pay

## 2015-02-04 ENCOUNTER — Other Ambulatory Visit: Payer: Commercial Managed Care - HMO

## 2015-02-05 DIAGNOSIS — M5416 Radiculopathy, lumbar region: Secondary | ICD-10-CM | POA: Diagnosis not present

## 2015-02-05 DIAGNOSIS — M2550 Pain in unspecified joint: Secondary | ICD-10-CM | POA: Diagnosis not present

## 2015-02-05 DIAGNOSIS — M797 Fibromyalgia: Secondary | ICD-10-CM | POA: Diagnosis not present

## 2015-02-05 DIAGNOSIS — M4806 Spinal stenosis, lumbar region: Secondary | ICD-10-CM | POA: Diagnosis not present

## 2015-02-11 ENCOUNTER — Ambulatory Visit: Payer: Commercial Managed Care - HMO | Admitting: Family Medicine

## 2015-02-25 ENCOUNTER — Other Ambulatory Visit: Payer: Self-pay | Admitting: Family Medicine

## 2015-02-26 DIAGNOSIS — M4806 Spinal stenosis, lumbar region: Secondary | ICD-10-CM | POA: Diagnosis not present

## 2015-02-26 DIAGNOSIS — M5416 Radiculopathy, lumbar region: Secondary | ICD-10-CM | POA: Diagnosis not present

## 2015-03-27 ENCOUNTER — Other Ambulatory Visit: Payer: Self-pay | Admitting: Family Medicine

## 2015-04-09 ENCOUNTER — Encounter: Payer: Self-pay | Admitting: Family Medicine

## 2015-04-09 ENCOUNTER — Ambulatory Visit (INDEPENDENT_AMBULATORY_CARE_PROVIDER_SITE_OTHER): Payer: Commercial Managed Care - HMO | Admitting: Family Medicine

## 2015-04-09 VITALS — BP 110/70 | HR 63 | Temp 98.7°F | Ht 61.5 in | Wt 174.5 lb

## 2015-04-09 DIAGNOSIS — H60392 Other infective otitis externa, left ear: Secondary | ICD-10-CM

## 2015-04-09 MED ORDER — NEOMYCIN-POLYMYXIN-HC 3.5-10000-1 OT SOLN: 3.0000 [drp] | Freq: Four times a day (QID) | OTIC | Status: DC

## 2015-04-09 NOTE — Progress Notes (Signed)
Dr. Frederico Hamman T. Kristee Angus, MD, [facility] [place], [address] Phone: [phone] Fax: [phone]  04/09/2015   L OE.  Pain over the last few days Some ear congestion as well  Past Medical History, Surgical History, Social History, Family History, Problem List, Medications, and Allergies have been reviewed and updated if relevant.  Patient Active Problem List   Diagnosis Date Noted  . Chronic cough 12/03/2014  . Spinal stenosis of lumbar region 12/03/2014  . COPD exacerbation 10/25/2014  . Counseling regarding end of life decision making 07/09/2014  . Cervical radiculopathy 01/08/2014  . Major depressive disorder, recurrent episode, moderate 11/27/2013  . Dysphagia 12/10/2011  . Lichen sclerosus et atrophicus of the vulva 03/01/2011  . BACK PAIN 10/02/2010  . Palpitations 01/21/2010  . INSOMNIA, CHRONIC 02/18/2009  . OSTEOARTHRITIS, HANDS, BILATERAL 02/18/2009  . Type II diabetes mellitus, well controlled 05/01/2007  . Essential hypertension, benign 05/01/2007  . Fibromyalgia 05/01/2007  . TRANSIENT ISCHEMIC ATTACK, HX OF 05/01/2007  . HYPERCHOLESTEROLEMIA 11/18/2006  . GLAUCOMA NOS 11/18/2006  . ALLERGIC RHINITIS 11/18/2006  . COPD 11/18/2006  . GERD 11/18/2006  . COLONIC POLYPS, HX OF 11/18/2006    Past Medical History  Diagnosis Date  . Allergy   . History of colon polyps   . Asthma with COPD   . Diabetes mellitus   . GERD (gastroesophageal reflux disease)   . Esophagitis   . HTN (hypertension)   . TIA (transient ischemic attack)   . Fibromyalgia   . Tachycardia   . Glaucoma   . Perforating folliculitis   . Spinal stenosis   . Lichen sclerosus     Past Surgical History  Procedure Laterality Date  . Appendectomy    . Lumpectomy    . Breast surgery      right(benign)  . Knee arthroscopy w/ meniscal repair      left  . Lipoma excision      2 times left leg  . Carpal tunnel release    .  Trigger finger release    . Tonsillectomy    . Epidural block injection  2013  . Abdominal hysterectomy      right ovary remaining    Social History   Social History  . Marital Status: Married    Spouse Name: N/A  . Number of Children: 1  . Years of Education: N/A   Occupational History  . retired Marketing executive)    Social History Main Topics  . Smoking status: Former Smoker    Quit date: 09/10/1988  . Smokeless tobacco: Never Used  . Alcohol Use: No  . Drug Use: No  . Sexual Activity: Not on file   Other Topics Concern  . Not on file   Social History Narrative   Regular exercise--no   Diet: fruits and veggies, calcium, water, avoids fast food                   Family History  Problem Relation Age of Onset  . Cancer Mother     breast  . Stroke Mother   . Valvular heart disease Mother   . Kidney disease Father   . Stroke Father   . Coronary artery disease Father   . Diabetes Brother   . Colon cancer Neg Hx   . Pancreatic cancer Neg Hx   . Stomach cancer Neg Hx     Allergies  Allergen Reactions  . Amoxicillin-Pot Clavulanate   .  Codeine     REACTION: Gives her migraines  . Hydrocodone-Acetaminophen   . Lovastatin     REACTION: myalgias  . Prednisone Other (See Comments)    Jittery, jumpey  . Sulfonamide Derivatives     REACTION: Rash  . Tizanidine     hallucination    Medication list reviewed and updated in full in [place].  ROS: GEN: Acute illness details above GI: Tolerating PO intake GU: maintaining adequate hydration and urination Pulm: No SOB Interactive and getting along well at home.  Otherwise, ROS is as per the HPI.   Objective:   BP 110/70 mmHg  Pulse 63  Temp(Src) 98.7 F (37.1 C) (Oral)  Ht 5' 1.5" (1.562 m)  Wt 174 lb 8 oz (79.153 kg)  BMI 32.44 kg/m2   Gen: WDWN, NAD; A & O x3, cooperative. Pleasant.Globally Non-toxic HEENT: Normocephalic and atraumatic. Throat clear, w/o exudate, R TM clear, L TM - good  landmarks, + fluid present. Tragus TTP, swollen red canal.  rhinnorhea.  MMM Frontal sinuses: NT Max sinuses: NT NECK: Anterior cervical  LAD is absent CV: RRR, No M/G/R, cap refill <2 sec PULM: Breathing comfortably in no respiratory distress. no wheezing, crackles, rhonchi EXT: No c/c/e PSYCH: Friendly, good eye contact MSK: Nml gait     Laboratory and Imaging Data:  Assessment and Plan:   Otitis, externa, infective, left  Supportive care + ABX  Follow-up: No Follow-up on file.  New Prescriptions   NEOMYCIN-POLYMYXIN-HYDROCORTISONE (CORTISPORIN) OTIC SOLUTION    Place 3 drops into the left ear 4 (four) times daily.   No orders of the defined types were placed in this encounter.    Signed,  Maud Deed. Caylin Nass, MD   Patient's Medications  New Prescriptions   NEOMYCIN-POLYMYXIN-HYDROCORTISONE (CORTISPORIN) OTIC SOLUTION    Place 3 drops into the left ear 4 (four) times daily.  Previous Medications   ACCU-CHEK SOFTCLIX LANCETS LANCETS    Use to check blood sugar two times a day.  Dx:250.00 Non-Insulin   ALBUTEROL (PROVENTIL HFA;VENTOLIN HFA) 108 (90 BASE) MCG/ACT INHALER    2 puffs every 4 hours as needed for wheezing.   ASPIRIN 81 MG TABLET    Take 81 mg by mouth as needed.   BENZONATATE (TESSALON) 200 MG CAPSULE    Take 1 capsule (200 mg total) by mouth 2 (two) times daily as needed for cough.   BLOOD GLUCOSE MONITORING SUPPL (ACCU-CHEK AVIVA) DEVICE    Use to check blood sugars two times a day.  Dx:250.00 Non-Insulin   CALCIUM CARBONATE (TUMS - DOSED IN MG ELEMENTAL CALCIUM) 500 MG CHEWABLE TABLET    Chew 1 tablet by mouth as needed.   CLOBETASOL (OLUX) 0.05 % TOPICAL FOAM    Apply 1 application topically 2 (two) times daily as needed.    CLOBETASOL OINTMENT (TEMOVATE) 0.05 %    Apply topically. Use 2 to 3 times weekly   DICLOFENAC SODIUM (VOLTAREN) 1 % GEL    Apply 2 g topically 4 (four) times daily as needed.   GLUCOSE BLOOD (ACCU-CHEK AVIVA PLUS) TEST STRIP    Use to  check blood sugar two times a day.  Dx:250.00 Non-Insulin   IBUPROFEN (ADVIL,MOTRIN) 200 MG TABLET    Take 200 mg by mouth as needed.   LATANOPROST (XALATAN) 0.005 % OPHTHALMIC SOLUTION    Place 1 drop into both eyes at bedtime.   LOSARTAN (COZAAR) 50 MG TABLET    Take 1 tablet (50 mg total) by mouth daily.  MELOXICAM (MOBIC) 7.5 MG TABLET    Take 7.5 mg by mouth daily.   METFORMIN (GLUCOPHAGE-XR) 500 MG 24 HR TABLET    TAKE 1 TABLET TWICE DAILY   METOPROLOL (LOPRESSOR) 50 MG TABLET    TAKE 1 TABLET TWICE DAILY   MULTIPLE VITAMINS-MINERALS (CENTRUM ADULTS PO)    Take 1 tablet by mouth daily.   NEOMYCIN-BACITRACIN-POLYMYXIN (NEOSPORIN) OINTMENT    Apply 1 application topically as needed for wound care. apply to eye   OMEPRAZOLE (PRILOSEC) 20 MG CAPSULE    Take 1 capsule (20 mg total) by mouth daily.   TIMOLOL (TIMOPTIC) 0.5 % OPHTHALMIC SOLUTION    Place 1 drop into both eyes daily.    TRAMADOL (ULTRAM) 50 MG TABLET    TAKE 1 TABLET FOUR TIMES DAILY   VENLAFAXINE XR (EFFEXOR-XR) 75 MG 24 HR CAPSULE    TAKE 1 CAPSULE EVERY DAY WITH BREAKFAST  Modified Medications   No medications on file  Discontinued Medications   PREDNISONE (DELTASONE) 20 MG TABLET    3 tabs by mouth daily x 3 days, then 2 tabs by mouth daily x 2 days then 1 tab by mouth daily x 2 days   RETAPAMULIN (ALTABAX) 1 % OINTMENT    Apply topically 2 (two) times daily.

## 2015-04-09 NOTE — Progress Notes (Signed)
Pre visit review using our clinic review tool, if applicable. No additional management support is needed unless otherwise documented below in the visit note. 

## 2015-04-14 ENCOUNTER — Other Ambulatory Visit: Payer: Self-pay | Admitting: Family Medicine

## 2015-04-15 ENCOUNTER — Telehealth: Payer: Self-pay | Admitting: Family Medicine

## 2015-04-15 DIAGNOSIS — E119 Type 2 diabetes mellitus without complications: Secondary | ICD-10-CM

## 2015-04-15 DIAGNOSIS — E7800 Pure hypercholesterolemia, unspecified: Secondary | ICD-10-CM

## 2015-04-15 NOTE — Telephone Encounter (Signed)
-----   Message from Ellamae Sia sent at 04/10/2015  5:21 PM EDT ----- Regarding: Lab orders for Thursday, 9.8.16 Lab orders for a 6 month follow up appt

## 2015-04-17 ENCOUNTER — Other Ambulatory Visit (INDEPENDENT_AMBULATORY_CARE_PROVIDER_SITE_OTHER): Payer: Commercial Managed Care - HMO

## 2015-04-17 DIAGNOSIS — E119 Type 2 diabetes mellitus without complications: Secondary | ICD-10-CM | POA: Diagnosis not present

## 2015-04-17 DIAGNOSIS — E7800 Pure hypercholesterolemia, unspecified: Secondary | ICD-10-CM

## 2015-04-17 LAB — LIPID PANEL
CHOL/HDL RATIO: 4
Cholesterol: 229 mg/dL — ABNORMAL HIGH (ref 0–200)
HDL: 60.5 mg/dL (ref >39.00)
LDL Cholesterol: 142 mg/dL — ABNORMAL HIGH (ref 0–99)
NONHDL: 168.57
TRIGLYCERIDES: 134 mg/dL (ref 0.0–149.0)
VLDL: 26.8 mg/dL (ref 0.0–40.0)

## 2015-04-17 LAB — COMPREHENSIVE METABOLIC PANEL
ALK PHOS: 60 U/L (ref 39–117)
ALT: 12 U/L (ref 0–35)
AST: 16 U/L (ref 0–37)
Albumin: 4.1 g/dL (ref 3.5–5.2)
BILIRUBIN TOTAL: 0.3 mg/dL (ref 0.2–1.2)
BUN: 21 mg/dL (ref 6–23)
CO2: 29 meq/L (ref 19–32)
CREATININE: 1.16 mg/dL (ref 0.40–1.20)
Calcium: 10.2 mg/dL (ref 8.4–10.5)
Chloride: 102 mEq/L (ref 96–112)
GFR: 49.23 mL/min — AB (ref >60.00)
GLUCOSE: 109 mg/dL — AB (ref 70–99)
Potassium: 4.8 mEq/L (ref 3.5–5.1)
Sodium: 139 mEq/L (ref 135–145)
TOTAL PROTEIN: 7 g/dL (ref 6.0–8.3)

## 2015-04-17 LAB — HEMOGLOBIN A1C: HEMOGLOBIN A1C: 6.3 % (ref 4.6–6.5)

## 2015-04-25 ENCOUNTER — Ambulatory Visit (INDEPENDENT_AMBULATORY_CARE_PROVIDER_SITE_OTHER): Payer: Commercial Managed Care - HMO | Admitting: Family Medicine

## 2015-04-25 ENCOUNTER — Encounter: Payer: Self-pay | Admitting: Family Medicine

## 2015-04-25 VITALS — BP 120/76 | HR 57 | Temp 98.5°F | Ht 61.5 in | Wt 173.0 lb

## 2015-04-25 DIAGNOSIS — H539 Unspecified visual disturbance: Secondary | ICD-10-CM

## 2015-04-25 DIAGNOSIS — L659 Nonscarring hair loss, unspecified: Secondary | ICD-10-CM | POA: Diagnosis not present

## 2015-04-25 DIAGNOSIS — I10 Essential (primary) hypertension: Secondary | ICD-10-CM | POA: Diagnosis not present

## 2015-04-25 DIAGNOSIS — E7800 Pure hypercholesterolemia, unspecified: Secondary | ICD-10-CM

## 2015-04-25 DIAGNOSIS — Z23 Encounter for immunization: Secondary | ICD-10-CM

## 2015-04-25 DIAGNOSIS — R51 Headache: Secondary | ICD-10-CM | POA: Diagnosis not present

## 2015-04-25 DIAGNOSIS — Z8349 Family history of other endocrine, nutritional and metabolic diseases: Secondary | ICD-10-CM | POA: Diagnosis not present

## 2015-04-25 DIAGNOSIS — R519 Headache, unspecified: Secondary | ICD-10-CM

## 2015-04-25 DIAGNOSIS — E119 Type 2 diabetes mellitus without complications: Secondary | ICD-10-CM

## 2015-04-25 DIAGNOSIS — Z8673 Personal history of transient ischemic attack (TIA), and cerebral infarction without residual deficits: Secondary | ICD-10-CM | POA: Diagnosis not present

## 2015-04-25 DIAGNOSIS — Z8489 Family history of other specified conditions: Secondary | ICD-10-CM

## 2015-04-25 LAB — HM DIABETES FOOT EXAM

## 2015-04-25 LAB — TSH: TSH: 4.66 u[IU]/mL — AB (ref 0.35–4.50)

## 2015-04-25 LAB — SEDIMENTATION RATE: SED RATE: 35 mm/h — AB (ref 0–22)

## 2015-04-25 MED ORDER — LOSARTAN POTASSIUM 50 MG PO TABS: 50.0000 mg | ORAL_TABLET | Freq: Every day | ORAL | Status: DC

## 2015-04-25 MED ORDER — TRAMADOL HCL 50 MG PO TABS: ORAL_TABLET | ORAL | Status: DC

## 2015-04-25 MED ORDER — VENLAFAXINE HCL ER 75 MG PO CP24: ORAL_CAPSULE | ORAL | Status: DC

## 2015-04-25 NOTE — Assessment & Plan Note (Signed)
Unusual bilateral right sided visual change in pt with history of TIA as well as family hx of brain tumor.  Differential included TIAs, brain mass, seizures, mood disorder and anxiety about daughter, eye issue ( per pt she has had recent nml eye eval with eye MD) or temporal arteritis.  Will eval further with MRI brain. Consider carotids, neuro referral etc. Will check sed rate  and TSH as well today.

## 2015-04-25 NOTE — Assessment & Plan Note (Signed)
Ear infeciton resolved. Not clearly a migraine as described, ? Tension versus from neck and back issues.  Given other issues will eval further as state below.

## 2015-04-25 NOTE — Assessment & Plan Note (Signed)
Poor control, pt refuses further treatment.

## 2015-04-25 NOTE — Progress Notes (Signed)
Subjective:    Patient ID: Cathy Mullins, female    DOB: [birth year], 69 y.o.   MRN: [id number]  HPI   69 year old female presents for DM follow up as well as new issues.  Ever since ear infection in 04/09/2015 having pain in left side of head, sharp. Lasting a few seconds. Not in temple, moore in lateral head.  IN last few months she did fall out of bed, not sure if hit head, hit shoulder, arm and knee. No LOC.   Visual change describes off and on, once a week, in last several months no change in frequency. Wavy shimmery light on right side of both eyes. No change noted at recen eye exam.. Notes distorted vision on right side. Episodes last few hours.   Intermittant dizziness.  Has noted her hair falling out, dry skin, Mother with thyroid issues.. She wishes her  thryoid to be checked.  Hx of migraine with aura, but no associated headache with above visual changes.    Hypertension: At goal <130/80 on lisinopril, metoprolol, propranolol prn.  BP Readings from Last 3 Encounters:  04/25/15 120/76  04/09/15 110/70  12/03/14 120/70  Using medication without problems or lightheadedness: see below  Chest pain with exertion:None  Edema:None  [place] of breath:stable Average home BPs: not checking lately. Other issues:   Elevated Cholesterol: Inadequate control but better, LDL goal <70 given TIA On no medication.  Never tried flax seed oil, does not tolerated red yeast rice, SE to several statin meds...severe myalgia.  Trial of welchol ... She had trouble taking due to thickness and cause constipation. She refuses to try tablet form. Lab Results  Component Value Date   CHOL 229* 04/17/2015   HDL 60.50 04/17/2015   LDLCALC 142* 04/17/2015   LDLDIRECT 162.5 07/09/2014   TRIG 134.0 04/17/2015   CHOLHDL 4 04/17/2015   Diet compliance: Moderate.  Exercise minimal. Other complaints: Hx of TIA. off ASA   Diabetes: At goal on glucophage. 500mg   XR BID. Lab Results  Component  Value Date   HGBA1C 6.3 04/17/2015   Using medications without difficulties: None  Hypoglycemic episodes:None  Hyperglycemic episodes:None  Feet problems:None  Blood Sugars averaging:118-120 eye exam within last year:yes         Social History /Family History/Past Medical History reviewed and updated if needed. Mother with low thyroid Review of Systems  Constitutional: Positive for fatigue. Negative for fever.  HENT: Negative for ear discharge and ear pain.   Eyes: Positive for visual disturbance. Negative for photophobia, pain, discharge, redness and itching.  Cardiovascular: Negative for chest pain and palpitations.  Gastrointestinal: Negative for abdominal pain.       Objective:   Physical Exam  Constitutional: She is oriented to person, place, and time. Vital signs are normal. She appears well-developed and well-nourished. She is cooperative.  Non-toxic appearance. She does not appear ill. No distress.  Overweight   HENT:  Head: Normocephalic.  Right Ear: Hearing, tympanic membrane, external ear and ear canal normal. Tympanic membrane is not erythematous, not retracted and not bulging.  Left Ear: Hearing, tympanic membrane, external ear and ear canal normal. Tympanic membrane is not erythematous, not retracted and not bulging.  Nose: No mucosal edema or rhinorrhea. Right sinus exhibits no maxillary sinus tenderness and no frontal sinus tenderness. Left sinus exhibits no maxillary sinus tenderness and no frontal sinus tenderness.  Mouth/Throat: Uvula is midline, oropharynx is clear and moist and mucous membranes are normal.  Eyes: Conjunctivae, EOM  and lids are normal. Pupils are equal, round, and reactive to light. Lids are everted and swept, no foreign bodies found.  Neck: Trachea normal and normal range of motion. Neck supple. Carotid bruit is not present. No thyroid mass and no thyromegaly present.  Cardiovascular: Normal rate, regular rhythm, S1 normal, S2 normal,  normal heart sounds, intact distal pulses and normal pulses.  Exam reveals no gallop and no friction rub.   No murmur heard. Pulmonary/Chest: Effort normal and breath sounds normal. No tachypnea. No respiratory distress. She has no decreased breath sounds. She has no wheezes. She has no rhonchi. She has no rales.  Abdominal: Soft. Normal appearance and bowel sounds are normal. There is no tenderness.  Neurological: She is alert and oriented to person, place, and time. She has normal strength and normal reflexes. No cranial nerve deficit or sensory deficit. She exhibits normal muscle tone. She displays a negative Romberg sign. Coordination and gait normal. GCS eye subscore is 4. GCS verbal subscore is 5. GCS motor subscore is 6.  Nml cerebellar exam   No papilledema  Skin: Skin is warm, dry and intact. No rash noted.  Psychiatric: She has a normal mood and affect. Her speech is normal and behavior is normal. Judgment and thought content normal. Her mood appears not anxious. Cognition and memory are normal. Cognition and memory are not impaired. She does not exhibit a depressed mood. She exhibits normal recent memory and normal remote memory.          Assessment & Plan:

## 2015-04-25 NOTE — Patient Instructions (Addendum)
Increase walking as able. Work on low cholesterol and low carb diet.  Stop at lab on way out.  Stop at front desk to set up MRI brain.

## 2015-04-25 NOTE — Assessment & Plan Note (Signed)
Well controlled. Continue current medication.  

## 2015-04-25 NOTE — Progress Notes (Signed)
Pre visit review using our clinic review tool, if applicable. No additional management support is needed unless otherwise documented below in the visit note. 

## 2015-04-25 NOTE — Assessment & Plan Note (Signed)
Improving control on metformin low dose.

## 2015-04-27 ENCOUNTER — Other Ambulatory Visit: Payer: Self-pay | Admitting: Family Medicine

## 2015-04-29 ENCOUNTER — Other Ambulatory Visit (INDEPENDENT_AMBULATORY_CARE_PROVIDER_SITE_OTHER): Payer: Commercial Managed Care - HMO

## 2015-04-29 DIAGNOSIS — R946 Abnormal results of thyroid function studies: Secondary | ICD-10-CM | POA: Diagnosis not present

## 2015-04-29 LAB — T4, FREE: Free T4: 0.78 ng/dL (ref 0.60–1.60)

## 2015-04-29 LAB — T3, FREE: T3 FREE: 2.9 pg/mL (ref 2.3–4.2)

## 2015-05-13 ENCOUNTER — Ambulatory Visit
Admission: RE | Admit: 2015-05-13 | Discharge: 2015-05-13 | Disposition: A | Discharge status: Home / Self Care | Payer: Commercial Managed Care - HMO | Source: Ambulatory Visit | Attending: Family Medicine | Admitting: Family Medicine

## 2015-05-13 DIAGNOSIS — H539 Unspecified visual disturbance: Secondary | ICD-10-CM

## 2015-05-13 DIAGNOSIS — Z8673 Personal history of transient ischemic attack (TIA), and cerebral infarction without residual deficits: Secondary | ICD-10-CM

## 2015-05-13 DIAGNOSIS — Z8349 Family history of other endocrine, nutritional and metabolic diseases: Secondary | ICD-10-CM

## 2015-05-13 DIAGNOSIS — R42 Dizziness and giddiness: Secondary | ICD-10-CM | POA: Diagnosis not present

## 2015-05-13 MED ORDER — GADOBENATE DIMEGLUMINE 529 MG/ML IV SOLN
16.0000 mL | Freq: Once | INTRAVENOUS | Status: AC | PRN
  Administered 2015-05-13: 16 mL via INTRAVENOUS

## 2015-05-15 DIAGNOSIS — H401131 Primary open-angle glaucoma, bilateral, mild stage: Secondary | ICD-10-CM | POA: Diagnosis not present

## 2015-05-22 DIAGNOSIS — H401131 Primary open-angle glaucoma, bilateral, mild stage: Secondary | ICD-10-CM | POA: Diagnosis not present

## 2015-06-08 ENCOUNTER — Other Ambulatory Visit: Payer: Self-pay | Admitting: Family Medicine

## 2015-06-20 ENCOUNTER — Other Ambulatory Visit: Payer: Self-pay

## 2015-06-20 MED ORDER — DICLOFENAC SODIUM 1 % TD GEL: 2.0000 g | Freq: Four times a day (QID) | TRANSDERMAL | Status: DC | PRN

## 2015-06-20 NOTE — Telephone Encounter (Signed)
Pt notified refill done

## 2015-06-20 NOTE — Telephone Encounter (Signed)
Pt left v/m requesting refill voltaren 1 % gel to [facility] [place]. rx last refilled # 300 g x 11 on 03/21/15. Pt last seen 04/25/15.

## 2015-07-10 ENCOUNTER — Other Ambulatory Visit: Payer: Self-pay | Admitting: Family Medicine

## 2015-07-29 DIAGNOSIS — R69 Illness, unspecified: Secondary | ICD-10-CM | POA: Diagnosis not present

## 2015-08-18 ENCOUNTER — Other Ambulatory Visit: Payer: Self-pay | Admitting: Family Medicine

## 2015-08-18 NOTE — Telephone Encounter (Signed)
Last office visit 04/25/2015.  Last refilled 04/25/2015 for #120 with 3 refills.  Ok to refill?

## 2015-08-19 NOTE — Telephone Encounter (Signed)
Tramadol called into [facility].

## 2015-10-14 ENCOUNTER — Other Ambulatory Visit: Payer: Self-pay

## 2015-10-14 MED ORDER — MELOXICAM 7.5 MG PO TABS: 7.5000 mg | ORAL_TABLET | Freq: Every day | ORAL | Status: DC

## 2015-10-14 MED ORDER — METOPROLOL TARTRATE 50 MG PO TABS: 50.0000 mg | ORAL_TABLET | Freq: Two times a day (BID) | ORAL | Status: DC

## 2015-10-14 NOTE — Telephone Encounter (Signed)
Pt left /vm requesting refill metoprolol (done) and meloxicam to [facility]. Pt last seen 04/25/15.

## 2015-10-22 ENCOUNTER — Telehealth: Payer: Self-pay | Admitting: Family Medicine

## 2015-10-22 DIAGNOSIS — E7800 Pure hypercholesterolemia, unspecified: Secondary | ICD-10-CM

## 2015-10-22 DIAGNOSIS — E119 Type 2 diabetes mellitus without complications: Secondary | ICD-10-CM

## 2015-10-22 DIAGNOSIS — Z1159 Encounter for screening for other viral diseases: Secondary | ICD-10-CM

## 2015-10-22 NOTE — Telephone Encounter (Signed)
-----   Message from Ellamae Sia sent at 10/16/2015  1:01 PM EST ----- Regarding: Lab orders for Thursday, 3.16.17  [name] lab orders, please.

## 2015-10-23 ENCOUNTER — Other Ambulatory Visit (INDEPENDENT_AMBULATORY_CARE_PROVIDER_SITE_OTHER): Payer: Commercial Managed Care - HMO

## 2015-10-23 ENCOUNTER — Other Ambulatory Visit: Payer: Self-pay | Admitting: Family Medicine

## 2015-10-23 DIAGNOSIS — E7800 Pure hypercholesterolemia, unspecified: Secondary | ICD-10-CM | POA: Diagnosis not present

## 2015-10-23 DIAGNOSIS — Z1159 Encounter for screening for other viral diseases: Secondary | ICD-10-CM | POA: Diagnosis not present

## 2015-10-23 DIAGNOSIS — E119 Type 2 diabetes mellitus without complications: Secondary | ICD-10-CM | POA: Diagnosis not present

## 2015-10-23 LAB — LDL CHOLESTEROL, DIRECT: LDL DIRECT: 154 mg/dL

## 2015-10-23 LAB — COMPREHENSIVE METABOLIC PANEL
ALBUMIN: 4.3 g/dL (ref 3.5–5.2)
ALT: 11 U/L (ref 0–35)
AST: 16 U/L (ref 0–37)
Alkaline Phosphatase: 63 U/L (ref 39–117)
BUN: 25 mg/dL — AB (ref 6–23)
CHLORIDE: 101 meq/L (ref 96–112)
CO2: 28 meq/L (ref 19–32)
Calcium: 9.4 mg/dL (ref 8.4–10.5)
Creatinine, Ser: 1.02 mg/dL (ref 0.40–1.20)
GFR: 57.02 mL/min — ABNORMAL LOW (ref >60.00)
Glucose, Bld: 102 mg/dL — ABNORMAL HIGH (ref 70–99)
POTASSIUM: 4.1 meq/L (ref 3.5–5.1)
SODIUM: 136 meq/L (ref 135–145)
Total Bilirubin: 0.2 mg/dL (ref 0.2–1.2)
Total Protein: 7.6 g/dL (ref 6.0–8.3)

## 2015-10-23 LAB — LIPID PANEL
CHOLESTEROL: 252 mg/dL — AB (ref 0–200)
HDL: 63 mg/dL (ref >39.00)
NonHDL: 189.3
Total CHOL/HDL Ratio: 4
Triglycerides: 228 mg/dL — ABNORMAL HIGH (ref 0.0–149.0)
VLDL: 45.6 mg/dL — AB (ref 0.0–40.0)

## 2015-10-23 LAB — HEMOGLOBIN A1C: HEMOGLOBIN A1C: 6.4 % (ref 4.6–6.5)

## 2015-10-24 LAB — HEPATITIS C ANTIBODY: HCV Ab: NEGATIVE

## 2015-10-30 ENCOUNTER — Encounter: Payer: Commercial Managed Care - HMO | Admitting: Family Medicine

## 2015-11-18 DIAGNOSIS — H401131 Primary open-angle glaucoma, bilateral, mild stage: Secondary | ICD-10-CM | POA: Diagnosis not present

## 2015-11-18 LAB — HM DIABETES EYE EXAM

## 2015-11-27 ENCOUNTER — Telehealth: Payer: Self-pay | Admitting: Family Medicine

## 2015-11-27 ENCOUNTER — Ambulatory Visit (INDEPENDENT_AMBULATORY_CARE_PROVIDER_SITE_OTHER)
Admission: RE | Admit: 2015-11-27 | Discharge: 2015-11-27 | Disposition: A | Discharge status: Home / Self Care | Payer: Commercial Managed Care - HMO | Source: Ambulatory Visit | Attending: Family Medicine | Admitting: Family Medicine

## 2015-11-27 ENCOUNTER — Ambulatory Visit
Admission: RE | Admit: 2015-11-27 | Discharge: 2015-11-27 | Disposition: A | Discharge status: Home / Self Care | Payer: Commercial Managed Care - HMO | Source: Ambulatory Visit | Attending: Family Medicine | Admitting: Family Medicine

## 2015-11-27 ENCOUNTER — Ambulatory Visit (INDEPENDENT_AMBULATORY_CARE_PROVIDER_SITE_OTHER): Payer: Commercial Managed Care - HMO | Admitting: Family Medicine

## 2015-11-27 ENCOUNTER — Encounter: Payer: Self-pay | Admitting: Family Medicine

## 2015-11-27 VITALS — BP 102/80 | HR 60 | Temp 98.6°F | Ht 61.0 in | Wt 176.8 lb

## 2015-11-27 DIAGNOSIS — M79601 Pain in right arm: Secondary | ICD-10-CM

## 2015-11-27 DIAGNOSIS — R5383 Other fatigue: Secondary | ICD-10-CM

## 2015-11-27 DIAGNOSIS — E7800 Pure hypercholesterolemia, unspecified: Secondary | ICD-10-CM

## 2015-11-27 DIAGNOSIS — R6889 Other general symptoms and signs: Secondary | ICD-10-CM | POA: Diagnosis not present

## 2015-11-27 DIAGNOSIS — M25511 Pain in right shoulder: Secondary | ICD-10-CM

## 2015-11-27 DIAGNOSIS — Z7189 Other specified counseling: Secondary | ICD-10-CM

## 2015-11-27 DIAGNOSIS — Z0000 Encounter for general adult medical examination without abnormal findings: Secondary | ICD-10-CM | POA: Diagnosis not present

## 2015-11-27 DIAGNOSIS — I10 Essential (primary) hypertension: Secondary | ICD-10-CM

## 2015-11-27 DIAGNOSIS — M542 Cervicalgia: Secondary | ICD-10-CM | POA: Diagnosis not present

## 2015-11-27 DIAGNOSIS — E348 Other specified endocrine disorders: Secondary | ICD-10-CM

## 2015-11-27 DIAGNOSIS — E2839 Other primary ovarian failure: Secondary | ICD-10-CM

## 2015-11-27 DIAGNOSIS — E119 Type 2 diabetes mellitus without complications: Secondary | ICD-10-CM

## 2015-11-27 LAB — CBC WITH DIFFERENTIAL/PLATELET
BASOS ABS: 0.1 10*3/uL (ref 0.0–0.1)
Basophils Relative: 0.7 % (ref 0.0–3.0)
Eosinophils Absolute: 0.6 10*3/uL (ref 0.0–0.7)
Eosinophils Relative: 6.8 % — ABNORMAL HIGH (ref 0.0–5.0)
HEMATOCRIT: 36.4 % (ref 36.0–46.0)
HEMOGLOBIN: 12.2 g/dL (ref 12.0–15.0)
LYMPHS ABS: 3.5 10*3/uL (ref 0.7–4.0)
LYMPHS PCT: 37.2 % (ref 12.0–46.0)
MCHC: 33.5 g/dL (ref 30.0–36.0)
MCV: 85.2 fl (ref 78.0–100.0)
MONOS PCT: 7.5 % (ref 3.0–12.0)
Monocytes Absolute: 0.7 10*3/uL (ref 0.1–1.0)
NEUTROS PCT: 47.8 % (ref 43.0–77.0)
Neutro Abs: 4.5 10*3/uL (ref 1.4–7.7)
Platelets: 398 10*3/uL (ref 150.0–400.0)
RBC: 4.27 Mil/uL (ref 3.87–5.11)
RDW: 14.1 % (ref 11.5–15.5)
WBC: 9.3 10*3/uL (ref 4.0–10.5)

## 2015-11-27 LAB — T4, FREE: Free T4: 0.83 ng/dL (ref 0.60–1.60)

## 2015-11-27 LAB — VITAMIN B12: Vitamin B-12: 492 pg/mL (ref 211–911)

## 2015-11-27 LAB — T3, FREE: T3 FREE: 3.1 pg/mL (ref 2.3–4.2)

## 2015-11-27 LAB — TSH: TSH: 2.73 u[IU]/mL (ref 0.35–4.50)

## 2015-11-27 LAB — VITAMIN D 25 HYDROXY (VIT D DEFICIENCY, FRACTURES): VITD: 29.75 ng/mL — AB (ref 30.00–100.00)

## 2015-11-27 NOTE — Telephone Encounter (Signed)
i called to schedule pt bone density  Can you change the order to say estrogen deficency  Thank you

## 2015-11-27 NOTE — Progress Notes (Signed)
I have personally reviewed the Medicare Annual Wellness questionnaire and have noted 1. The patient's medical and social history 2. Their use of alcohol, tobacco or illicit drugs 3. Their current medications and supplements 4. The patient's functional ability including ADL's, fall risks, home safety risks and hearing or visual             impairment. 5. Diet and physical activities 6. Evidence for depression or mood disorders 7.         Updated provider list Cognitive evaluation was performed and recorded on pt medicare questionnaire form. The patients weight, height, BMI and visual acuity have been recorded in the chart  I have made referrals, counseling and provided education to the patient based review of the above and I have provided the pt with a written personalized care plan for preventive services.   Fibromyalgia: Tolerable control at this point except for below new symptoms. She is using tramadol at night only. Her mood is stable on venlafaxine.  Her daughter in now doing better but still in pain.  2 months ago she had sudden pain in right arm, armpit and right lateral side if breast.  Noted swelling for a few days, swelling decreased. Since then off and on numbness, weakness and pain in right arm. She has had chronic neck pain for a while. Nml RO in neck, Does have some pain with moving head toward right. Has also noted numbness on bottom of right foot.  Occ jerking pain throughout the body.  Mobic helps some with pain. Cannot use more than -3 days at a time given sensitive stomach.  2015  Cervical film: decrease disc space C5-C6    2 weeks ago, 2 episodes of feeling like cold bucket of water dropped on brain only. Hx of CVA in parents. Made her stop and stand still for 10 min , second episode was 1 week ago, lying in bed. Resolved. No LOC, able to move, no weakness, no numbness She has been very tired overall.  takes asa 81 mg daily, increased to 325 for pain control in  last few weeks.  No recent falls. No head injury  Hypertension: At goal <130/80 on lisinopril, metoprolol, propranolol prn.  BP Readings from Last 3 Encounters:  11/27/15 102/80  04/25/15 120/76  04/09/15 110/70  Using medication without problems or lightheadedness: see below  Chest pain with exertion:None  Edema:None  Ramiro of breath:stable Average home BPs: not checking lately. Other issues:   Elevated Cholesterol:Inadequate control, goal <70 given TIA On no medication. Poor control tri and LDL far from goal. Never tried flax seed oil, does not tolerated red yeast rice, SE to several statin meds...severe myalgia.  Trial of welchol ... She had trouble taking due to thickness and cause constipation. She refuses to try tablet form. Lab Results  Component Value Date   CHOL 252* 10/23/2015   HDL 63.00 10/23/2015   LDLCALC 142* 04/17/2015   LDLDIRECT 154.0 10/23/2015   TRIG 228.0* 10/23/2015   CHOLHDL 4 10/23/2015  Diet compliance: Moderate.  Exercise minimal. Other complaints: Hx of TIA. off ASA   Diabetes: At goal on Glucophage. Lab Results  Component Value Date   HGBA1C 6.4 10/23/2015  Using medications without difficulties: None  Hypoglycemic episodes:None  Hyperglycemic episodes:None  Feet problems:None  Blood Sugars averaging:100-120 eye exam within last year:yes   Wt Readings from Last 3 Encounters:  11/27/15 176 lb 12.8 oz (80.196 kg)  05/13/15 170 lb (77.111 kg)  04/25/15 173 lb (78.472 kg)  Social History /Family History/Past Medical History reviewed and updated if needed.   Review of Systems  Constitutional: Negative for fever, fatigue and unexpected weight change.  HENT: Negative for ear pain, congestion, sore throat, sneezing, trouble swallowing and sinus pressure.  Eyes: Negative for pain and itching.  Respiratory: Negative for cough, shortness of breath and wheezing.  Cardiovascular: Negative for chest pain, palpitations and leg  swelling.  Gastrointestinal: Negative for nausea, abdominal pain, diarrhea, constipation and blood in stool.  Genitourinary: Negative for dysuria, hematuria, vaginal discharge, difficulty urinating and menstrual problem.  Skin: Negative for rash.  Neurological: Negative for syncope, weakness, light-headedness, numbness and headaches.  Psychiatric/Behavioral: Negative for confusion and dysphoric mood. The patient is not nervous/anxious.  Mole on right arm dull Appearing , mole under left breast is itchy.  Objective:   Physical Exam  Constitutional: Vital signs are normal. She appears well-developed and well-nourished. She is cooperative. Non-toxic appearance. She does not appear ill. No distress.  HENT:  Head: Normocephalic.  Right Ear: Hearing, tympanic membrane, external ear and ear canal normal.  Left Ear: Hearing, tympanic membrane, external ear and ear canal normal.  Nose: Nose normal.  Eyes: Conjunctivae normal, EOM and lids are normal. Pupils are equal, round, and reactive to light. No foreign bodies found.  Neck: Trachea normal and normal range of motion. Neck supple. Carotid bruit is not present. No mass and no thyromegaly present.  Cardiovascular: Normal rate, regular rhythm, S1 normal, S2 normal, normal heart sounds and intact distal pulses. Exam reveals no gallop.  No murmur heard.  Pulmonary/Chest: Effort normal and breath sounds normal. No respiratory distress. She has no wheezes. She has no rhonchi. She has no rales.  Abdominal: Soft. Normal appearance and bowel sounds are normal. She exhibits no distension, no fluid wave, no abdominal bruit and no mass. There is no hepatosplenomegaly. There is no tenderness. There is no rebound, no guarding and no CVA tenderness. No hernia.  Genitourinary: refused breast exam, pap not indicated  Lymphadenopathy:  She has no cervical adenopathy.  She has no axillary adenopathy.  Neurological: She is alert. She has normal  strength. No cranial nerve deficit or sensory deficit.  Skin: Skin is warm, dry and intact. No rash noted.  Psychiatric: Her speech is normal and behavior is normal. Judgment normal. Her mood appears not anxious. Cognition and memory are normal. She does not exhibit a depressed mood.  MSK: nonfocal ttp, decreased ROM of neck and right shoulder, positive neer's, neg drop arm and spurling. Diabetic foot exam: Normal inspection No skin breakdown.. Small crack on left great toe base.Marland Kitchen No erythema... Applying neosporin and bandaid No calluses  Normal DP pulses Normal sensation to light touch and monofilament Nails normal  Assessment & Plan:   Medicare wellness: The patient's preventative maintenance and recommended screening tests for an annual wellness exam were reviewed in full today.  Brought up to date unless services declined.  Counselled on the importance of diet, exercise, and its role in overall health and mortality.  The patient's FH and SH was reviewed, including their home life, tobacco status, and drug and alcohol status.   DXA: 05/2011 nml, repeat in 5 years, NOW Mammo: nml 3/ 2013, mother with breast cancer,  refues to repeat PAP/DVE: not indicated s/p abdominal hysterectomy, right ovary remaining for noncancerous reason.. Sees Dr. Kennon Rounds for GYN for lichen sclerosis but has not seen in a while. No family history of ovarian cancer. She has chosen to stop DVEs.  Vaccines:Up to date  with PNA 13, 23 and planning shingles vaccine, tdap due.  Colon cancer screening: colonoscopy Nml,2015. Dr. Deatra Ina, repeat in 10 years.  Hep C:  done nonsmoker

## 2015-11-27 NOTE — Assessment & Plan Note (Signed)
HCPOA: Kermitt Clingerman, has living will. Full code. (reviewed 2017)

## 2015-11-27 NOTE — Progress Notes (Signed)
Pre visit review using our clinic review tool, if applicable. No additional management support is needed unless otherwise documented below in the visit note. 

## 2015-11-27 NOTE — Telephone Encounter (Signed)
Diagnosis changed to estrogen deficiency.

## 2015-11-27 NOTE — Patient Instructions (Addendum)
Stay on aspirin 325 mg daily.  Stop at lab and X-ray on way out.  Stop at front desk to set up bone density.  Look  into shingles and tdap with insurance. Continue home PT for right shoulder. We may consider prednisone for pain in neck and shoulder. Continue meloxicam for now.

## 2015-11-28 ENCOUNTER — Telehealth: Payer: Self-pay | Admitting: Family Medicine

## 2015-11-28 DIAGNOSIS — R937 Abnormal findings on diagnostic imaging of other parts of musculoskeletal system: Secondary | ICD-10-CM

## 2015-11-28 DIAGNOSIS — G459 Transient cerebral ischemic attack, unspecified: Secondary | ICD-10-CM

## 2015-11-28 NOTE — Telephone Encounter (Signed)
-----   Message from Carter Kitten, [place] sent at 11/28/2015  3:18 PM EDT ----- Ms. Suppes notified as instructed by telephone.  She is agreeable to having carotid dopplers done.  She would also like a referral to a Neurologist due to feels her problems are nerve related.

## 2015-12-01 ENCOUNTER — Encounter: Payer: Self-pay | Admitting: Family Medicine

## 2015-12-04 ENCOUNTER — Ambulatory Visit: Payer: Commercial Managed Care - HMO

## 2015-12-04 DIAGNOSIS — R937 Abnormal findings on diagnostic imaging of other parts of musculoskeletal system: Secondary | ICD-10-CM

## 2015-12-04 DIAGNOSIS — G459 Transient cerebral ischemic attack, unspecified: Secondary | ICD-10-CM | POA: Diagnosis not present

## 2015-12-08 ENCOUNTER — Other Ambulatory Visit: Payer: Self-pay | Admitting: Family Medicine

## 2015-12-08 NOTE — Telephone Encounter (Signed)
Last office visit 11/27/2015.  Last refilled 08/19/2015 for #120 with 3 refills.  Ok to refill? Please print Rx to fax to mail order pharmacy.

## 2015-12-09 NOTE — Telephone Encounter (Signed)
Tramadol Rx placed in Dr. Rometta Emery in box for signature.

## 2015-12-10 ENCOUNTER — Encounter: Payer: Self-pay | Admitting: Neurology

## 2015-12-10 ENCOUNTER — Ambulatory Visit (INDEPENDENT_AMBULATORY_CARE_PROVIDER_SITE_OTHER): Payer: Commercial Managed Care - HMO | Admitting: Neurology

## 2015-12-10 VITALS — BP 124/70 | HR 72 | Ht 61.5 in | Wt 179.0 lb

## 2015-12-10 DIAGNOSIS — I679 Cerebrovascular disease, unspecified: Secondary | ICD-10-CM | POA: Diagnosis not present

## 2015-12-10 DIAGNOSIS — M797 Fibromyalgia: Secondary | ICD-10-CM

## 2015-12-10 NOTE — Progress Notes (Signed)
NEUROLOGY CONSULTATION NOTE  Cathy Mullins MRN: [id number] DOB: 09/09.1947  Referring provider: Dr. Diona Browner Primary care provider: Dr. Diona Browner  Reason for consult:  Possible TIA  HISTORY OF PRESENT ILLNESS: Cathy Mullins is a 70 year old rigth-handed woman with hypertension, hyperlipidemia, and fibromyalgia who presents for evaluation of transient ischemic attack versus MS.  History obtained by patient and PCP note.  Labs and imaging of brain MRI, lumbar MRI and cervical X-ray reviewed.  Cathy Mullins was diagnosed with fibromyalgia in 1994.  Since then, she has experienced intermittent body jerks, which occur twice a day.  She also has history of low back pain with numbness down the lateral right leg and into the foot.  MRI of lumbar spine from 10/17/11 showed shallow disc protrusion at L4-5, causing foraminal stenosis.  Recently, she had two episodes of altered sensorium.  She woke up one night with an out of body experience.  Another time, she was walking in her house and stopped because it felt like ice-cold water was poured over her brain.  Another time, she developed severe pain in the right upper arm, associated with swelling.  Sometimes she feels dizzy when she is up.  She has fallen in the past but not for several years.  She denied headache, slurred speech or focal weakness.  She is concerned about multiple sclerosis.  She reports prior history of TIA, which were reportedly "stress-related".  She has a strong family history of unspecified autoimmune disease (two cousins).  Her daughter had an angioma removed from her brain.  Her mother had history of fibromyalgia, presenting with similar symptoms.  Recent workup included: MRI of brain with and without contrast from 05/13/15 was unremarkable except for mild small vessel ischemic changes. Cervical X-ray from 11/27/15 was unremarkable. Carotid doppler from 12/04/15 showed no hemodynamically significant stenosis. Lipid panel from 10/23/15  showed cholesterol 252, TG 228, HDL 63 and direct LDL of 154.  Hgb A1c was 6.4.  PAST MEDICAL HISTORY: Past Medical History  Diagnosis Date  . Allergy   . History of colon polyps   . Asthma with COPD ([place])   . Diabetes mellitus   . GERD (gastroesophageal reflux disease)   . Esophagitis   . HTN (hypertension)   . TIA (transient ischemic attack)   . Fibromyalgia   . Tachycardia   . Glaucoma   . Perforating folliculitis   . Spinal stenosis   . Lichen sclerosus     PAST SURGICAL HISTORY: Past Surgical History  Procedure Laterality Date  . Appendectomy    . Lumpectomy    . Breast surgery      right(benign)  . Knee arthroscopy w/ meniscal repair      left  . Lipoma excision      2 times left leg  . Carpal tunnel release    . Trigger finger release    . Tonsillectomy    . Epidural block injection  2013  . Abdominal hysterectomy      right ovary remaining    MEDICATIONS: Current Outpatient Prescriptions on File Prior to Visit  Medication Sig Dispense Refill  . ACCU-CHEK SOFTCLIX LANCETS lancets Use to check blood sugar two times a day.  Dx:250.00 Non-Insulin 200 each 3  . albuterol (PROVENTIL HFA;VENTOLIN HFA) 108 (90 BASE) MCG/ACT inhaler 2 puffs every 4 hours as needed for wheezing. 1 Inhaler 0  . aspirin 81 MG tablet Take 81 mg by mouth as needed.    . Blood Glucose Monitoring Suppl (ACCU-CHEK  AVIVA) device Use to check blood sugars two times a day.  Dx:250.00 Non-Insulin 1 each 0  . calcium carbonate (TUMS - DOSED IN MG ELEMENTAL CALCIUM) 500 MG chewable tablet Chew 1 tablet by mouth as needed.    . clobetasol ointment (TEMOVATE) 0.05 % Apply topically. Use 2 to 3 times weekly    . diclofenac sodium (VOLTAREN) 1 % GEL Apply 2 g topically 4 (four) times daily as needed. 300 g 11  . docusate sodium (COLACE) 100 MG capsule Take 100 mg by mouth 2 (two) times daily.    Marland Kitchen glucose blood (ACCU-CHEK AVIVA PLUS) test strip Use to check blood sugar two times a day.  Dx:250.00  Non-Insulin 200 each 3  . ibuprofen (ADVIL,MOTRIN) 200 MG tablet Take 200 mg by mouth as needed.    . latanoprost (XALATAN) 0.005 % ophthalmic solution Place 1 drop into both eyes at bedtime.    Marland Kitchen losartan (COZAAR) 50 MG tablet Take 1 tablet (50 mg total) by mouth daily. 90 tablet 3  . meloxicam (MOBIC) 7.5 MG tablet Take 1 tablet (7.5 mg total) by mouth daily. 90 tablet 0  . metFORMIN (GLUCOPHAGE-XR) 500 MG 24 hr tablet TAKE 1 TABLET TWICE DAILY 180 tablet 1  . metoprolol (LOPRESSOR) 50 MG tablet Take 1 tablet (50 mg total) by mouth 2 (two) times daily. 180 tablet 1  . Multiple Vitamins-Minerals (CENTRUM ADULTS PO) Take 1 tablet by mouth daily.    Marland Kitchen neomycin-bacitracin-polymyxin (NEOSPORIN) ointment Apply 1 application topically as needed for wound care.     . neomycin-polymyxin-hydrocortisone (CORTISPORIN) otic solution Place 3 drops into the left ear 4 (four) times daily. 10 mL 0  . RANITIDINE & DIET MANAGE PROD PO Take 2 tablets by mouth at bedtime.     . timolol (TIMOPTIC) 0.5 % ophthalmic solution Place 1 drop into both eyes daily.     . traMADol (ULTRAM) 50 MG tablet TAKE 1 TABLET FOUR TIMES DAILY 120 tablet 3  . venlafaxine XR (EFFEXOR-XR) 75 MG 24 hr capsule TAKE 1 CAPSULE EVERY DAY WITH BREAKFAST 90 capsule 1   No current facility-administered medications on file prior to visit.    ALLERGIES: Allergies  Allergen Reactions  . Amoxicillin-Pot Clavulanate   . Codeine     REACTION: Gives her migraines  . Hydrocodone-Acetaminophen   . Lovastatin     REACTION: myalgias  . Prednisone Other (See Comments)    Jittery, jumpey  . Sulfonamide Derivatives     REACTION: Rash  . Tizanidine     hallucination    FAMILY HISTORY: Family History  Problem Relation Age of Onset  . Cancer Mother     breast  . Stroke Mother   . Valvular heart disease Mother   . Kidney disease Father   . Stroke Father   . Coronary artery disease Father   . Diabetes Brother   . Colon cancer Neg Hx   .  Pancreatic cancer Neg Hx   . Stomach cancer Neg Hx     SOCIAL HISTORY: Social History   Social History  . Marital Status: Married    Spouse Name: N/A  . Number of Children: 1  . Years of Education: N/A   Occupational History  . retired Marketing executive)    Social History Main Topics  . Smoking status: Former Smoker    Quit date: 09/10/1988  . Smokeless tobacco: Never Used  . Alcohol Use: No  . Drug Use: No  . Sexual Activity: Not on file  Other Topics Concern  . Not on file   Social History Narrative   Regular exercise--no   Diet: fruits and veggies, calcium, water, avoids fast food                   REVIEW OF SYSTEMS: Constitutional: No fevers, chills, or sweats, no generalized fatigue, change in appetite Eyes: No visual changes, double vision, eye pain Ear, nose and throat: No hearing loss, ear pain, nasal congestion, sore throat Cardiovascular: No chest pain, palpitations Respiratory:  No shortness of breath at rest or with exertion, wheezes GastrointestinaI: No nausea, vomiting, diarrhea, abdominal pain, fecal incontinence Genitourinary:  No dysuria, urinary retention or frequency Musculoskeletal:  Back pain Integumentary: No rash, pruritus, skin lesions Neurological: as above Psychiatric: anxiety Endocrine: No palpitations, fatigue, diaphoresis, mood swings, change in appetite, change in weight, increased thirst Hematologic/Lymphatic:  No purpura, petechiae. Allergic/Immunologic: no itchy/runny eyes, nasal congestion, recent allergic reactions, rashes  PHYSICAL EXAM: Filed Vitals:   12/10/15 0943  BP: 124/70  Pulse: 72   General: No acute distress.  Patient appears well-groomed.  Head:  Normocephalic/atraumatic Eyes:  fundi examined but not visualized Neck: supple, no paraspinal tenderness, full range of motion Back: Mid low-back tenderness Heart: regular rate and rhythm Lungs: Clear to auscultation bilaterally. Vascular: No carotid  bruits. Neurological Exam: Mental status: alert and oriented to person, place, and time, recent and remote memory intact, fund of knowledge intact, attention and concentration intact, speech fluent and not dysarthric, language intact. Cranial nerves: CN I: not tested CN II: pupils equal, round and reactive to light, visual fields intact CN III, IV, VI:  full range of motion, no nystagmus, no ptosis CN V: facial sensation intact CN VII: upper and lower face symmetric CN VIII: hearing intact CN IX, X: gag intact, uvula midline CN XI: sternocleidomastoid and trapezius muscles intact CN XII: tongue midline Bulk & Tone: normal, no fasciculations. Motor:  5/5 throughout  Sensation:  Endorses reduced temperature and vibration sensation in left lower extremity. Deep Tendon Reflexes:  2+ throughout except 3+ and symmetric in patellars, toes downgoing.  Finger to nose testing:  Without dysmetria.  Heel to shin:  Without dysmetria.  Gait:  Normal station and stride.  Able to turn and tandem walk. Romberg negative.  IMPRESSION: Fibromyalgia.  I believe her constellation of symptoms are related to her fibromyalgia.  She does not have MS nor do I believe she has been having TIAs.  Intermittent right leg numbness may be due to lumbar radiculopathy as demonstrated by disc protrusion on prior MRI.  She does have cerebrovascular disease, as demonstrated on MRI, which can be attributed to age, hypertension and hyperlipidemia.  PLAN: 1.  ASA 81mg  daily, blood pressure and cholesterol control 2.  No further neurologic workup needed, as I do not suspect symptoms are neurologic.  Thank you for allowing me to take part in the care of this patient.  Metta Clines, DO  CC:  Eliezer Lofts, MD

## 2015-12-10 NOTE — Patient Instructions (Signed)
I think your symptoms are related to fibromyalgia.  You do not have MS.  The MRI findings are seen with age and people with history of high blood pressure and high cholesterol.  The leg numbness is likely related to the pinched nerve in your back.  No further neurologic workup is needed.

## 2015-12-10 NOTE — Telephone Encounter (Signed)
Tramadol Rx faxed to [facility] [phone].

## 2015-12-15 ENCOUNTER — Ambulatory Visit
Admission: RE | Admit: 2015-12-15 | Discharge: 2015-12-15 | Disposition: A | Discharge status: Home / Self Care | Payer: Commercial Managed Care - HMO | Source: Ambulatory Visit | Attending: Family Medicine | Admitting: Family Medicine

## 2015-12-15 DIAGNOSIS — Z13820 Encounter for screening for osteoporosis: Secondary | ICD-10-CM | POA: Diagnosis not present

## 2015-12-15 DIAGNOSIS — E2839 Other primary ovarian failure: Secondary | ICD-10-CM

## 2015-12-15 DIAGNOSIS — Z780 Asymptomatic menopausal state: Secondary | ICD-10-CM | POA: Diagnosis not present

## 2015-12-16 NOTE — Assessment & Plan Note (Signed)
Well controlled. Continue current medication.  

## 2015-12-16 NOTE — Assessment & Plan Note (Signed)
Well controlleld on no med. 

## 2015-12-16 NOTE — Assessment & Plan Note (Signed)
INadequate control on o med. Has tried statis and welchol refused other meds to treat despite risk of CVA.

## 2015-12-16 NOTE — Assessment & Plan Note (Signed)
Unusual constellation of symptoms. No clear cause. ? CVA neuro change? Med SE.  Stay on ASA and reduce risk of CVA with improved chol control. If returns consider further eval.

## 2015-12-16 NOTE — Assessment & Plan Note (Signed)
Eval with X-ray of neck and shoulder. Continue NSAIDs and start home PT.

## 2015-12-25 ENCOUNTER — Ambulatory Visit: Payer: Commercial Managed Care - HMO | Admitting: Family Medicine

## 2015-12-30 ENCOUNTER — Ambulatory Visit: Payer: Commercial Managed Care - HMO | Admitting: Family Medicine

## 2016-01-08 ENCOUNTER — Encounter: Payer: Self-pay | Admitting: Family Medicine

## 2016-01-08 ENCOUNTER — Ambulatory Visit (INDEPENDENT_AMBULATORY_CARE_PROVIDER_SITE_OTHER): Payer: Commercial Managed Care - HMO | Admitting: Family Medicine

## 2016-01-08 VITALS — BP 130/70 | HR 62 | Temp 98.4°F | Ht 61.0 in | Wt 179.8 lb

## 2016-01-08 DIAGNOSIS — L989 Disorder of the skin and subcutaneous tissue, unspecified: Secondary | ICD-10-CM

## 2016-01-08 DIAGNOSIS — M25561 Pain in right knee: Secondary | ICD-10-CM | POA: Insufficient documentation

## 2016-01-08 DIAGNOSIS — M797 Fibromyalgia: Secondary | ICD-10-CM | POA: Diagnosis not present

## 2016-01-08 DIAGNOSIS — R6889 Other general symptoms and signs: Secondary | ICD-10-CM

## 2016-01-08 DIAGNOSIS — M25562 Pain in left knee: Secondary | ICD-10-CM

## 2016-01-08 DIAGNOSIS — M79601 Pain in right arm: Secondary | ICD-10-CM

## 2016-01-08 MED ORDER — MUPIROCIN 2 % EX OINT: 1.0000 "application " | TOPICAL_OINTMENT | Freq: Two times a day (BID) | CUTANEOUS | Status: DC

## 2016-01-08 NOTE — Assessment & Plan Note (Signed)
Likely secondary to osteoarthritis. Pt request consideration of Synvisc. Will refer to sports med, Dr.Copland.

## 2016-01-08 NOTE — Progress Notes (Signed)
Pre visit review using our clinic review tool, if applicable. No additional management support is needed unless otherwise documented below in the visit note. 

## 2016-01-08 NOTE — Patient Instructions (Addendum)
Stop at front desk for referral to make appointment with Dr. Lorelei Pont for left shoulder pain and chronic bilateral knee pain.  trial of topical Bactroban for skin lesions.  [facility]une gentle home physical therapy.

## 2016-01-08 NOTE — Progress Notes (Signed)
Subjective:    Patient ID: Cathy Mullins, female    DOB: 10-23-1945, 70 y.o.   MRN: [id number]  HPI   70 year old female presents for 4 week follow up.  She recently was seen by [facility] for altered sensorium. she had two episodes of altered sensorium. She woke up one night with an out of body experience. Another time, she was walking in her house and stopped because it felt like ice-cold water was poured over her brain. Another time, she developed severe pain in the right upper arm, associated with swelling. Sometimes she feels dizzy when she is up. He felt her symptoms were related to her fibromyalgia and were NOT consistent with TIAs. He recommended asa 81 mg daily. No further neuro work up needed.  Today she reports:  1. She has no further issues with altered sensorium. Still having pain in right arm and pain keeping her up at night. 3 hours a night, naps 1-3 hours in day.  Uses voltaren gel and tramadol for pain.  2.She is having some knee pain from OA.  She is interested in  Seeing Dr. Lorelei Pont for Synvisc.  3.She is also continuing to be having right shoulder pain.  Worsening, area of focal pain in right lateral upper arm. No proceeding fall. 11/2015 X-ray neg  She has seen Dr. Lorelei Pont for past left chronic shoulder pain. Doing PT.  4. She has noted skin lesions occuring in last 3 weeks. No itchy, mildly sore.  Clear discharge Applying neosporin and hydrocortisone.  Has history fo follicutitis on chin.   Review of Systems  Constitutional: Negative for fever and fatigue.  HENT: Negative for ear pain.   Eyes: Negative for pain.  Respiratory: Negative for chest tightness and shortness of breath.   Cardiovascular: Negative for chest pain, palpitations and leg swelling.  Gastrointestinal: Negative for abdominal pain.  Genitourinary: Negative for dysuria.       Objective:   Physical Exam  Constitutional: Vital signs are normal. She appears well-developed and  well-nourished. She is cooperative.  Non-toxic appearance. She does not appear ill. No distress.  HENT:  Head: Normocephalic.  Right Ear: Hearing, tympanic membrane, external ear and ear canal normal. Tympanic membrane is not erythematous, not retracted and not bulging.  Left Ear: Hearing, tympanic membrane, external ear and ear canal normal. Tympanic membrane is not erythematous, not retracted and not bulging.  Nose: No mucosal edema or rhinorrhea. Right sinus exhibits no maxillary sinus tenderness and no frontal sinus tenderness. Left sinus exhibits no maxillary sinus tenderness and no frontal sinus tenderness.  Mouth/Throat: Uvula is midline, oropharynx is clear and moist and mucous membranes are normal.  Eyes: Conjunctivae, EOM and lids are normal. Pupils are equal, round, and reactive to light. Lids are everted and swept, no foreign bodies found.  Neck: Trachea normal and normal range of motion. Neck supple. Carotid bruit is not present. No thyroid mass and no thyromegaly present.  Cardiovascular: Normal rate, regular rhythm, S1 normal, S2 normal, normal heart sounds, intact distal pulses and normal pulses.  Exam reveals no gallop and no friction rub.   No murmur heard. Pulmonary/Chest: Effort normal and breath sounds normal. No tachypnea. No respiratory distress. She has no decreased breath sounds. She has no wheezes. She has no rhonchi. She has no rales.  Abdominal: Soft. Normal appearance and bowel sounds are normal. There is no tenderness.  Neurological: She is alert.  Skin: Skin is warm, dry and intact. No rash noted.  Left  face.. 4 erythematous patches, with some ulceration, honey colored discharge.  Psychiatric: Her speech is normal and behavior is normal. Judgment and thought content normal. Her mood appears not anxious. Cognition and memory are normal. She does not exhibit a depressed mood.          Assessment & Plan:

## 2016-01-19 ENCOUNTER — Ambulatory Visit
Admission: RE | Admit: 2016-01-19 | Discharge: 2016-01-19 | Disposition: A | Discharge status: Home / Self Care | Payer: Commercial Managed Care - HMO | Source: Ambulatory Visit | Attending: Family Medicine | Admitting: Family Medicine

## 2016-01-19 ENCOUNTER — Ambulatory Visit (INDEPENDENT_AMBULATORY_CARE_PROVIDER_SITE_OTHER)
Admission: RE | Admit: 2016-01-19 | Discharge: 2016-01-19 | Disposition: A | Discharge status: Home / Self Care | Payer: Commercial Managed Care - HMO | Source: Ambulatory Visit | Attending: Family Medicine | Admitting: Family Medicine

## 2016-01-19 ENCOUNTER — Encounter: Payer: Self-pay | Admitting: Family Medicine

## 2016-01-19 ENCOUNTER — Ambulatory Visit (INDEPENDENT_AMBULATORY_CARE_PROVIDER_SITE_OTHER): Payer: Commercial Managed Care - HMO | Admitting: Family Medicine

## 2016-01-19 ENCOUNTER — Telehealth: Payer: Self-pay | Admitting: Family Medicine

## 2016-01-19 ENCOUNTER — Ambulatory Visit: Payer: Commercial Managed Care - HMO

## 2016-01-19 VITALS — BP 120/70 | HR 63 | Temp 98.6°F | Ht 61.0 in | Wt 178.5 lb

## 2016-01-19 DIAGNOSIS — Z8739 Personal history of other diseases of the musculoskeletal system and connective tissue: Secondary | ICD-10-CM

## 2016-01-19 DIAGNOSIS — M79601 Pain in right arm: Secondary | ICD-10-CM | POA: Diagnosis not present

## 2016-01-19 DIAGNOSIS — M25562 Pain in left knee: Secondary | ICD-10-CM | POA: Diagnosis not present

## 2016-01-19 DIAGNOSIS — M25561 Pain in right knee: Secondary | ICD-10-CM

## 2016-01-19 DIAGNOSIS — M7989 Other specified soft tissue disorders: Secondary | ICD-10-CM | POA: Diagnosis not present

## 2016-01-19 NOTE — Progress Notes (Signed)
Pre visit review using our clinic review tool, if applicable. No additional management support is needed unless otherwise documented below in the visit note. 

## 2016-01-19 NOTE — Telephone Encounter (Signed)
Left message for Mrs. Treat to return my call. 

## 2016-01-19 NOTE — Patient Instructions (Signed)

## 2016-01-19 NOTE — Telephone Encounter (Signed)
Please call - we were alerted  Patient was negative for DVT in her right arm.  I am trying to think globally about her picture and see if I can come up with anything that might help her.  Her knee x-rays look like she essentially has no significant arthritis.
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
## 2016-01-20 MED ORDER — COLCHICINE 0.6 MG PO TABS: 0.6000 mg | ORAL_TABLET | Freq: Every day | ORAL | Status: DC

## 2016-01-20 NOTE — Addendum Note (Signed)
Addended by: Carter Kitten on: 01/20/2016 10:21 AM   Modules accepted: Orders

## 2016-01-20 NOTE — Telephone Encounter (Signed)
Mrs. Selner notified as instructed by telephone.  Colchicine 0.6 mg prescription sent into [facility] on [address] as instructed by Dr. Lorelei Pont.  Follow up appointment scheduled for 02/16/2016 at 2:00 pm.

## 2016-01-20 NOTE — Telephone Encounter (Signed)
Pt returned your call Best number [phone]

## 2016-01-20 NOTE — Telephone Encounter (Signed)
LMOM.  Please call:  Right upper extremity ultrasound is completely negative.  No blood clot.  X-rays on both knees do not show any arthritis at all, and much better than anticipated.  They do show something called "chondrocalcinosis" which can be seen in something called pseudogout.  If she has this, it certainly can give you joint pains in multiple locations.  I think that it would be worthwhile to try a pseudogout specific medication to see if she feels better.  This is called colchicine.  I would love for her to try this, and then follow-up with me in about one month.  Colchicine 0.6 mg, 1 po daily, #30, 5 refills

## 2016-01-20 NOTE — Telephone Encounter (Signed)
i will try to call myself.

## 2016-02-16 ENCOUNTER — Ambulatory Visit (INDEPENDENT_AMBULATORY_CARE_PROVIDER_SITE_OTHER): Payer: Commercial Managed Care - HMO | Admitting: Family Medicine

## 2016-02-16 ENCOUNTER — Encounter: Payer: Self-pay | Admitting: Family Medicine

## 2016-02-16 VITALS — BP 120/60 | HR 62 | Temp 98.4°F | Ht 61.0 in | Wt 179.5 lb

## 2016-02-16 DIAGNOSIS — M25562 Pain in left knee: Secondary | ICD-10-CM | POA: Diagnosis not present

## 2016-02-16 DIAGNOSIS — Z8739 Personal history of other diseases of the musculoskeletal system and connective tissue: Secondary | ICD-10-CM | POA: Diagnosis not present

## 2016-02-16 DIAGNOSIS — M79601 Pain in right arm: Secondary | ICD-10-CM | POA: Diagnosis not present

## 2016-02-16 DIAGNOSIS — M25561 Pain in right knee: Secondary | ICD-10-CM | POA: Diagnosis not present

## 2016-02-16 NOTE — Progress Notes (Signed)
Pre visit review using our clinic review tool, if applicable. No additional management support is needed unless otherwise documented below in the visit note. 
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
## 2016-02-27 ENCOUNTER — Telehealth: Payer: Self-pay | Admitting: Family Medicine

## 2016-02-27 NOTE — Telephone Encounter (Signed)
error 

## 2016-03-02 ENCOUNTER — Ambulatory Visit (INDEPENDENT_AMBULATORY_CARE_PROVIDER_SITE_OTHER): Payer: Commercial Managed Care - HMO | Admitting: Family Medicine

## 2016-03-02 ENCOUNTER — Encounter: Payer: Self-pay | Admitting: Family Medicine

## 2016-03-02 VITALS — BP 148/74 | HR 61 | Temp 98.4°F | Ht 61.0 in | Wt 178.5 lb

## 2016-03-02 DIAGNOSIS — L299 Pruritus, unspecified: Secondary | ICD-10-CM | POA: Diagnosis not present

## 2016-03-02 DIAGNOSIS — Z1231 Encounter for screening mammogram for malignant neoplasm of breast: Secondary | ICD-10-CM

## 2016-03-02 MED ORDER — TRIAMCINOLONE ACETONIDE 0.1 % EX CREA: 1.0000 "application " | TOPICAL_CREAM | Freq: Two times a day (BID) | CUTANEOUS | 0 refills | Status: DC

## 2016-03-02 NOTE — Progress Notes (Signed)
   Subjective:    Patient ID: Cathy Mullins, female    DOB: 06-Nov-1945, 70 y.o.   MRN: [id number]  HPI  70 year old female presents with breast itchiness  bilaterally lateral aspect of breastnoted in last week. Left greater than right. She reports no associated rash.  No breast lumps, no breast pain. No nipple inversion, no nipple discharge  No axillary lumps.  Skin is not dry and flaky.  She has been applying lotion, OTC bath and body cream. Has used for years, no change. She has been putting cornstarch under breast to help with moisture from sweat. Has not treated in any way. New med colchicine for calcium pyrophosphate disease She is now able to move her shoulder very well!  No other itchiness.  Last mammogram: 2012.   Mother with breast cancer on nipple.   Review of Systems  Constitutional: Negative for fatigue and fever.  HENT: Negative for ear pain.   Eyes: Negative for pain.  Respiratory: Negative for cough and shortness of breath.   Cardiovascular: Negative for chest pain.       Objective:   Physical Exam  Constitutional: Vital signs are normal. She appears well-developed and well-nourished. She is cooperative.  Non-toxic appearance. She does not appear ill. No distress.  HENT:  Head: Normocephalic.  Right Ear: Hearing, tympanic membrane, external ear and ear canal normal. Tympanic membrane is not erythematous, not retracted and not bulging.  Left Ear: Hearing, tympanic membrane, external ear and ear canal normal. Tympanic membrane is not erythematous, not retracted and not bulging.  Nose: No mucosal edema or rhinorrhea. Right sinus exhibits no maxillary sinus tenderness and no frontal sinus tenderness. Left sinus exhibits no maxillary sinus tenderness and no frontal sinus tenderness.  Mouth/Throat: Uvula is midline, oropharynx is clear and moist and mucous membranes are normal.  Eyes: Conjunctivae, EOM and lids are normal. Pupils are equal, round, and reactive to  light. Lids are everted and swept, no foreign bodies found.  Neck: Trachea normal and normal range of motion. Neck supple. Carotid bruit is not present. No thyroid mass and no thyromegaly present.  Cardiovascular: Normal rate, regular rhythm, S1 normal, S2 normal, normal heart sounds, intact distal pulses and normal pulses.  Exam reveals no gallop and no friction rub.   No murmur heard. Pulmonary/Chest: Effort normal and breath sounds normal. No tachypnea. No respiratory distress. She has no decreased breath sounds. She has no wheezes. She has no rhonchi. She has no rales.  Abdominal: Soft. Normal appearance and bowel sounds are normal. There is no tenderness.  Genitourinary: No breast swelling, tenderness, discharge or bleeding.  Neurological: She is alert.  Skin: Skin is warm, dry and intact. No lesion and no rash noted.  Psychiatric: Her speech is normal and behavior is normal. Judgment and thought content normal. Her mood appears not anxious. Cognition and memory are normal. She does not exhibit a depressed mood.          Assessment & Plan:

## 2016-03-02 NOTE — Patient Instructions (Signed)
Stop at front desk to set up referral for mammogram.Use hypoallergenic cream on breasts like Cetaphil cream. Can use triamcinolone cream for itching prn.

## 2016-03-02 NOTE — Assessment & Plan Note (Signed)
?   Contact reaction, versus eczema. No breast changes on exam. Treat with hydrating cream, start dye and odor free cream. Can use topical steroid as needed for itching.

## 2016-03-02 NOTE — Progress Notes (Signed)
Pre visit review using our clinic review tool, if applicable. No additional management support is needed unless otherwise documented below in the visit note. 

## 2016-03-17 ENCOUNTER — Other Ambulatory Visit: Payer: Self-pay | Admitting: Family Medicine

## 2016-03-17 ENCOUNTER — Ambulatory Visit
Admission: RE | Admit: 2016-03-17 | Discharge: 2016-03-17 | Disposition: A | Discharge status: Home / Self Care | Payer: Commercial Managed Care - HMO | Source: Ambulatory Visit | Attending: Family Medicine | Admitting: Family Medicine

## 2016-03-17 DIAGNOSIS — Z1231 Encounter for screening mammogram for malignant neoplasm of breast: Secondary | ICD-10-CM | POA: Diagnosis not present

## 2016-03-19 DIAGNOSIS — L989 Disorder of the skin and subcutaneous tissue, unspecified: Secondary | ICD-10-CM | POA: Insufficient documentation

## 2016-03-19 NOTE — Assessment & Plan Note (Signed)
Concening for staph infeciton given honey colored crusts.  Will treat with mupirocin ointment.  if not improving pt to return for further evaluation.

## 2016-03-19 NOTE — Assessment & Plan Note (Signed)
Resolved. Unknown cause.

## 2016-03-19 NOTE — Assessment & Plan Note (Signed)
Moderate control. May be contributing to shoulder and knee experienced pain.

## 2016-03-19 NOTE — Assessment & Plan Note (Signed)
Refer to [facility] for further eval. ? Frozen shoulder.  Continue gentle home PT. Info given.

## 2016-03-30 ENCOUNTER — Telehealth: Payer: Self-pay | Admitting: Family Medicine

## 2016-03-30 NOTE — Telephone Encounter (Signed)
[facility]     Patient Name: Cathy Mullins    [facility]    Physician Eliezer Lofts - MD    Contact Type Call    Who Is Calling Patient / Member / Family / Caregiver    Call Type Triage / Clinical    Relationship To Patient Self    Return Phone Number [phone] (Primary)    Chief Complaint Leg Swelling And Edema  Gender: Female Reason for Call Symptomatic / Request for Health Information  DOB: [birth year]  Initial Comment Caller states she woke up this morning with severe right leg pain. (Has history of fibromyalgia and arthritis). The inside of her right thigh is swollen from groin area to the knee.   Age: 70 Y 11 M 13 D Appointment Disposition EMR Appointment Attempted - Not Scheduled  Return Phone Number: [phone] (Primary) Info pasted into Epic Yes  Address:  PreDisposition Go to ED  City/State/Zip: [place]  Translation No    Nurse Assessment  Nurse: Venetia Maxon, RN, Manuela Schwartz Date/Time (Eastern Time): 03/30/2016 4:01:11 PM  Confirm and document reason for call. If symptomatic, describe symptoms. You must click the next button to save text entered. ---aller states she woke up this morning with severe right leg pain. (Has history of fibromyalgia and arthritis). The inside of her right thigh is swollen from groin area to the knee. She had swelling in upper Rt arm. hurt and finally went down. no blood clot seen on testing. no fever . Last urine was an hr ago "goes alot" she drinks alot.  Has the patient traveled out of the country within the last 30 days? ---No  Does the patient have any new or worsening symptoms? ---Yes  Will a triage be completed? ---Yes  Related visit to physician within the last 2 weeks? ---No  Does the PT have any chronic conditions? (i.e. diabetes, asthma, etc.) ---Yes  List chronic  conditions. ---fibromyalgia , arthritis , NIDDM, osteoarthritis , not in her knees though. HTN  Is this a behavioral health or substance abuse call? ---No    Guidelines      Guideline Title Affirmed Question Affirmed Notes Nurse Date/Time Eilene Ghazi Time)  Leg Swelling and Edema [1] Swelling is painful to touch AND [2] fever  Venetia Maxon, RN, Manuela Schwartz 03/30/2016 4:04:28 PM  Disp. Time Eilene Ghazi Time) Disposition Final User         03/30/2016 4:07:56 PM Go to ED Now (or PCP triage) Yes Venetia Maxon, RN, Edwena Bunde Understands: Yes   Disagree/Comply: Comply      Care Advice Given Per Guideline         GO TO ED NOW (OR PCP TRIAGE): DRIVING: Another adult should drive. CARE ADVICE given per Leg Swelling and Edema (Adult) guideline. * Please bring a list of your current medicines when you go to see the doctor. BRING MEDICINES:             Referrals   [facility]   [facility] - ED

## 2016-04-02 ENCOUNTER — Other Ambulatory Visit: Payer: Self-pay | Admitting: Family Medicine

## 2016-04-02 NOTE — Telephone Encounter (Signed)
Tramadol called into [facility].

## 2016-04-02 NOTE — Telephone Encounter (Signed)
Pt request status of tramadol refill; advised pt  Butch Penny CMA called in today to [place] at 3:45. Pm. Pt voiced understanding.

## 2016-04-02 NOTE — Telephone Encounter (Signed)
Last office visit 03/02/2016.  Last refilled 12/08/2015 for #120 with  3 refills.  Ok to refill?

## 2016-04-17 ENCOUNTER — Other Ambulatory Visit: Payer: Self-pay | Admitting: Family Medicine

## 2016-05-20 ENCOUNTER — Ambulatory Visit (INDEPENDENT_AMBULATORY_CARE_PROVIDER_SITE_OTHER): Payer: Commercial Managed Care - HMO | Admitting: Family Medicine

## 2016-05-20 ENCOUNTER — Encounter: Payer: Self-pay | Admitting: Family Medicine

## 2016-05-20 VITALS — BP 124/70 | HR 64 | Temp 97.7°F | Ht 61.0 in | Wt 178.5 lb

## 2016-05-20 DIAGNOSIS — I872 Venous insufficiency (chronic) (peripheral): Secondary | ICD-10-CM

## 2016-05-20 DIAGNOSIS — Z23 Encounter for immunization: Secondary | ICD-10-CM

## 2016-05-20 DIAGNOSIS — I83811 Varicose veins of right lower extremities with pain: Secondary | ICD-10-CM | POA: Diagnosis not present

## 2016-05-20 DIAGNOSIS — K219 Gastro-esophageal reflux disease without esophagitis: Secondary | ICD-10-CM

## 2016-05-20 DIAGNOSIS — M722 Plantar fascial fibromatosis: Secondary | ICD-10-CM | POA: Diagnosis not present

## 2016-05-20 MED ORDER — OMEPRAZOLE 20 MG PO CPDR: 20.0000 mg | DELAYED_RELEASE_CAPSULE | Freq: Every day | ORAL | 11 refills | Status: DC

## 2016-05-20 NOTE — Progress Notes (Signed)
Dr. Karleen Hampshire T. Jordon Kristiansen, MD, CAQ [facility] [address] [place], [address] Phone: [phone] Fax: [phone]  05/20/2016  Patient: Cathy Mullins, MRN: [id number], DOB: 1946-01-30, 70 y.o.  Primary Physician:  Kerby Nora, MD   Chief Complaint  Patient presents with  . Foot Pain    Left Heel  . Edema    Right Foot  . Leg Pain   Subjective:   Cathy Mullins is a 70 y.o. very pleasant female patient who presents with the following:  The patient presents with a multitude of symptoms.  She starts to list off anywhere from 10-15 symptoms very rapidly.  I try to direct the patient and in her work-in office slot, she points to primary complaints of nausea, reflux, pain in her left heel as well as pain in her right leg.  Pain and throbbing as well as swelling in the right leg is her primary complaint.  Having a lot of nausea and reflux. This is an ongoing issue, has been 1 for her for some time, and she takes Zantac at nighttime.  She previously was on a PPI, and she stopped this secondary concerns for possible osteoporosis.  Pain in her left foot. She has pain in the heel when she wakes up. Left foot is hurting.  Some going up to her  PF  Right leg throbs hurting and swollen.  She is not really having any pain  Posteriorly at all.   She is worried about the  Swelling overall, and she also has some pain and some enlargement of some of the veins throughout the right leg.   These have become more painful over time.  She also has diffuse spider veins.  Past Medical History, Surgical History, Social History, Family History, Problem List, Medications, and Allergies have been reviewed and updated if relevant.  Patient Active Problem List   Diagnosis Date Noted  . Skin lesion of face 03/19/2016  . Itchy skin 03/02/2016  . Bilateral knee pain 01/08/2016  . Right arm pain 11/27/2015  . Neck pain 11/27/2015  . Sensation of feeling cold  11/27/2015  . Fatigue 11/27/2015  . Chronic cough 12/03/2014  . Spinal stenosis of lumbar region 12/03/2014  . COPD exacerbation (HCC) 10/25/2014  . Counseling regarding end of life decision making 07/09/2014  . Major depressive disorder, recurrent episode, moderate (HCC) 11/27/2013  . Dysphagia 12/10/2011  . Lichen sclerosus et atrophicus of the vulva 03/01/2011  . BACK PAIN 10/02/2010  . Palpitations 01/21/2010  . INSOMNIA, CHRONIC 02/18/2009  . OSTEOARTHRITIS, HANDS, BILATERAL 02/18/2009  . Type II diabetes mellitus, well controlled (HCC) 05/01/2007  . Essential hypertension, benign 05/01/2007  . Fibromyalgia 05/01/2007  . Hx-TIA (transient ischemic attack) 05/01/2007  . HYPERCHOLESTEROLEMIA 11/18/2006  . GLAUCOMA NOS 11/18/2006  . ALLERGIC RHINITIS 11/18/2006  . COPD 11/18/2006  . GERD 11/18/2006  . COLONIC POLYPS, HX OF 11/18/2006    Past Medical History:  Diagnosis Date  . Allergy   . Asthma with COPD (HCC)   . Diabetes mellitus   . Esophagitis   . Fibromyalgia   . GERD (gastroesophageal reflux disease)   . Glaucoma   . History of colon polyps   . HTN (hypertension)   . Lichen sclerosus   . Perforating folliculitis   . Spinal stenosis   . Tachycardia   . TIA (transient ischemic attack)     Past Surgical History:  Procedure Laterality Date  . ABDOMINAL HYSTERECTOMY  right ovary remaining  . APPENDECTOMY    . BREAST CYST ASPIRATION Bilateral   . BREAST EXCISIONAL BIOPSY Right 1982   NEG  . BREAST SURGERY     right(benign)  . CARPAL TUNNEL RELEASE    . EPIDURAL BLOCK INJECTION  2013  . KNEE ARTHROSCOPY W/ MENISCAL REPAIR     left  . LIPOMA EXCISION     2 times left leg  . lumpectomy    . TONSILLECTOMY    . TRIGGER FINGER RELEASE      Social History   Social History  . Marital status: Married    Spouse name: N/A  . Number of children: 1  . Years of education: N/A   Occupational History  . retired (Research officer, political party) Retired   Social History  Main Topics  . Smoking status: Former Smoker    Quit date: 09/10/1988  . Smokeless tobacco: Never Used  . Alcohol use No  . Drug use: No  . Sexual activity: Not on file   Other Topics Concern  . Not on file   Social History Narrative   Regular exercise--no   Diet: fruits and veggies, calcium, water, avoids fast food                   Family History  Problem Relation Age of Onset  . Cancer Mother     breast  . Stroke Mother   . Valvular heart disease Mother   . Breast cancer Mother   . Kidney disease Father   . Stroke Father   . Coronary artery disease Father   . Diabetes Brother   . Colon cancer Neg Hx   . Pancreatic cancer Neg Hx   . Stomach cancer Neg Hx     Allergies  Allergen Reactions  . Amoxicillin-Pot Clavulanate   . Codeine     REACTION: Gives her migraines  . Hydrocodone-Acetaminophen   . Lovastatin     REACTION: myalgias  . Prednisone Other (See Comments)    Jittery, jumpey  . Sulfonamide Derivatives     REACTION: Rash  . Tizanidine     hallucination    Medication list reviewed and updated in full in [facility].  GEN: No fevers, chills. Nontoxic. Primarily MSK c/o today. MSK: Detailed in the HPI GI: tolerating PO intake without difficulty Neuro: No numbness, parasthesias, or tingling associated. Otherwise the pertinent positives of the ROS are noted above.   Objective:   BP 124/70   Pulse 64   Temp 97.7 F (36.5 C) (Oral)   Ht 5\' 1"  (1.549 m)   Wt 178 lb 8 oz (81 kg)   BMI 33.73 kg/m    GEN: WDWN, NAD, Non-toxic, Alert & Oriented x 3 HEENT: Atraumatic, Normocephalic.  Ears and Nose: No external deformity. EXTR: No clubbing/cyanosis/edema NEURO: Normal gait.  PSYCH: Normally interactive. Conversant. Not depressed or anxious appearing.  Calm demeanor.    Left heel is minimally tender to palpation.  Calcaneus is nontender.  Achilles tendon is nontender.  Mildly tender on the plantar aspect, and somewhat tender more distally  along the plantar fascia itself.  Pulses are patent bilaterally, 2+ DP and PT  Homans is negative bilaterally.  She does have  Diffuse and extensive spider veins on the right.  She also appears to have some varicosities.  ABD: S, NT, ND, + BS, No rebound, No HSM   Radiology: No results found.  Assessment and Plan:   Venous reflux - Plan: Ambulatory referral to  Vascular Surgery  Need for prophylactic vaccination and inoculation against influenza - Plan: Flu Vaccine QUAD 36+ mos IM  Varicose veins of right lower extremity with pain - Plan: Ambulatory referral to Vascular Surgery  Plantar fasciitis, left  Gastroesophageal reflux disease, esophagitis presence not specified  Ooverall, the patient seems somewhat anxious.  She seemed to be reassured after we talked for a while in the office in a reassured her about some of her conditions.  I suspect that some of her discomfort in the right leg is secondary to some long-term vascular changes.  We will have her see vascular surgery for evaluation  Of varicosity  Along with spider veins and other vascular changes in her right leg primarily. Does not correspond orthopedic pathology.  Her plantar fasciitis is not all that significant clinically currently based on her exam, but that would be the appropriate diagnosis.  Is very concerned about her reflux and nausea.  Recommend  Change from H2 blocker to a PPI, starting in the morning 30 minutes before breakfast.  If this does not work, recommended change to different PPI.  Follow-up: with PCP if needed and regular care  New Prescriptions   OMEPRAZOLE (PRILOSEC) 20 MG CAPSULE    Take 1 capsule (20 mg total) by mouth daily.   Orders Placed This Encounter  Procedures  . Flu Vaccine QUAD 36+ mos IM  . Ambulatory referral to Vascular Surgery    Signed,  Elpidio Galea. Yuliza Cara, MD   Patient's Medications  New Prescriptions   OMEPRAZOLE (PRILOSEC) 20 MG CAPSULE    Take 1 capsule (20 mg  total) by mouth daily.  Previous Medications   ACCU-CHEK SOFTCLIX LANCETS LANCETS    Use to check blood sugar two times a day.  Dx:250.00 Non-Insulin   ALBUTEROL (PROVENTIL HFA;VENTOLIN HFA) 108 (90 BASE) MCG/ACT INHALER    2 puffs every 4 hours as needed for wheezing.   ASPIRIN 81 MG TABLET    Take 81 mg by mouth as needed.   BLOOD GLUCOSE MONITORING SUPPL (ACCU-CHEK AVIVA) DEVICE    Use to check blood sugars two times a day.  Dx:250.00 Non-Insulin   CALCIUM CARBONATE (TUMS - DOSED IN MG ELEMENTAL CALCIUM) 500 MG CHEWABLE TABLET    Chew 1 tablet by mouth as needed.   CLOBETASOL OINTMENT (TEMOVATE) 0.05 %    Apply topically. Use 2 to 3 times weekly   COLCHICINE (COLCRYS) 0.6 MG TABLET    Take 1 tablet (0.6 mg total) by mouth daily.   DICLOFENAC SODIUM (VOLTAREN) 1 % GEL    Apply 2 g topically 4 (four) times daily as needed.   DOCUSATE SODIUM (COLACE) 100 MG CAPSULE    Take 100 mg by mouth 2 (two) times daily.   GLUCOSE BLOOD (ACCU-CHEK AVIVA PLUS) TEST STRIP    Use to check blood sugar two times a day.  Dx:250.00 Non-Insulin   IBUPROFEN (ADVIL,MOTRIN) 200 MG TABLET    Take 200 mg by mouth as needed.   LATANOPROST (XALATAN) 0.005 % OPHTHALMIC SOLUTION    Place 1 drop into both eyes at bedtime.   LOSARTAN (COZAAR) 50 MG TABLET    Take 1 tablet (50 mg total) by mouth daily.   METFORMIN (GLUCOPHAGE-XR) 500 MG 24 HR TABLET    TAKE 1 TABLET TWICE DAILY   METOPROLOL (LOPRESSOR) 50 MG TABLET    TAKE 1 TABLET TWICE DAILY   MULTIPLE VITAMINS-MINERALS (CENTRUM ADULTS PO)    Take 1 tablet by mouth daily.   MUPIROCIN OINTMENT (  BACTROBAN) 2 %    Apply 1 application topically 2 (two) times daily.   NEOMYCIN-BACITRACIN-POLYMYXIN (NEOSPORIN) OINTMENT    Apply 1 application topically as needed for wound care.    NEOMYCIN-POLYMYXIN-HYDROCORTISONE (CORTISPORIN) OTIC SOLUTION    Place 3 drops into the left ear 4 (four) times daily.   RANITIDINE & DIET MANAGE PROD PO    Take 2 tablets by mouth at bedtime.    TIMOLOL  (TIMOPTIC) 0.5 % OPHTHALMIC SOLUTION    Place 1 drop into both eyes daily.    TRAMADOL (ULTRAM) 50 MG TABLET    TAKE 1 TABLET FOUR TIMES DAILY   TRIAMCINOLONE CREAM (KENALOG) 0.1 %    Apply 1 application topically 2 (two) times daily.   VENLAFAXINE XR (EFFEXOR-XR) 75 MG 24 HR CAPSULE    TAKE 1 CAPSULE EVERY DAY WITH BREAKFAST  Modified Medications   No medications on file  Discontinued Medications   MELOXICAM (MOBIC) 7.5 MG TABLET    Take 1 tablet (7.5 mg total) by mouth daily.

## 2016-05-20 NOTE — Progress Notes (Signed)
Pre visit review using our clinic review tool, if applicable. No additional management support is needed unless otherwise documented below in the visit note. 

## 2016-06-04 ENCOUNTER — Encounter (INDEPENDENT_AMBULATORY_CARE_PROVIDER_SITE_OTHER): Payer: Self-pay | Admitting: Vascular Surgery

## 2016-06-04 ENCOUNTER — Ambulatory Visit (INDEPENDENT_AMBULATORY_CARE_PROVIDER_SITE_OTHER): Payer: Commercial Managed Care - HMO | Admitting: Vascular Surgery

## 2016-06-04 VITALS — BP 150/74 | HR 61 | Resp 16 | Ht 61.0 in | Wt 177.0 lb

## 2016-06-04 DIAGNOSIS — E119 Type 2 diabetes mellitus without complications: Secondary | ICD-10-CM

## 2016-06-04 DIAGNOSIS — M48062 Spinal stenosis, lumbar region with neurogenic claudication: Secondary | ICD-10-CM

## 2016-06-04 DIAGNOSIS — I10 Essential (primary) hypertension: Secondary | ICD-10-CM

## 2016-06-04 DIAGNOSIS — M79604 Pain in right leg: Secondary | ICD-10-CM | POA: Diagnosis not present

## 2016-06-04 DIAGNOSIS — M79605 Pain in left leg: Secondary | ICD-10-CM

## 2016-06-04 DIAGNOSIS — E7800 Pure hypercholesterolemia, unspecified: Secondary | ICD-10-CM | POA: Diagnosis not present

## 2016-06-04 DIAGNOSIS — M79609 Pain in unspecified limb: Secondary | ICD-10-CM | POA: Insufficient documentation

## 2016-06-04 NOTE — Assessment & Plan Note (Signed)
blood glucose control important in reducing the progression of atherosclerotic disease. Also, involved in wound healing. On appropriate medications.  

## 2016-06-04 NOTE — Progress Notes (Signed)
Patient ID: Rise Paganini, female   DOB: 10/07/1945, 70 y.o.   MRN: [id number]  Chief Complaint  Patient presents with  . New Patient (Initial Visit)    HPI Cathy Mullins is a 70 y.o. female.  I am asked to see the patient by Dr. Diona Browner for evaluation of vascular disease as a potential cause of her lower extremity symptoms.  The patient reports severe pain in her feet and lower legs. This wakes her from night. Certain positions such as sitting or lying flat cause pain shooting down her legs and into her feet. She also describes cramping in her muscles with activity. This is sometimes relieved with rest. This has been steadily progressive problem over many months with no clear inciting event or causative factor. She does not have a history of ulceration or infection. She does have associated symptoms of swelling in both lower extremities which has been gradually progressive over time. Both lower extremity is her affected. She has more pain in her left forefoot and heel and a little more pain on the right in the ankle and lower leg. The pain is very severe at her feet are also very sensitive to touch. She has multiple atherosclerotic risk factors as described below   Past Medical History:  Diagnosis Date  . Allergy   . Asthma with COPD ([place])   . Diabetes mellitus   . Esophagitis   . Fibromyalgia   . GERD (gastroesophageal reflux disease)   . Glaucoma   . History of colon polyps   . HTN (hypertension)   . Hyperlipidemia   . Lichen sclerosus   . Perforating folliculitis   . Spinal stenosis   . Tachycardia   . TIA (transient ischemic attack)     Past Surgical History:  Procedure Laterality Date  . ABDOMINAL HYSTERECTOMY     right ovary remaining  . APPENDECTOMY    . BREAST CYST ASPIRATION Bilateral   . BREAST EXCISIONAL BIOPSY Right 1982   NEG  . BREAST SURGERY     right(benign)  . CARPAL TUNNEL RELEASE    . EPIDURAL BLOCK INJECTION  2013  . KNEE ARTHROSCOPY W/ MENISCAL  REPAIR     left  . LIPOMA EXCISION     2 times left leg  . lumpectomy    . TONSILLECTOMY    . TRIGGER FINGER RELEASE      Family History  Problem Relation Age of Onset  . Cancer Mother     breast  . Stroke Mother   . Valvular heart disease Mother   . Breast cancer Mother   . Kidney disease Father   . Stroke Father   . Coronary artery disease Father   . Diabetes Brother   . Colon cancer Neg Hx   . Pancreatic cancer Neg Hx   . Stomach cancer Neg Hx      Social History Social History  Substance Use Topics  . Smoking status: Former Smoker    Quit date: 09/10/1988  . Smokeless tobacco: Never Used  . Alcohol use No   No IV drug use  Allergies  Allergen Reactions  . Amoxicillin-Pot Clavulanate   . Codeine     REACTION: Gives her migraines  . Hydrocodone-Acetaminophen   . Lovastatin     REACTION: myalgias  . Prednisone Other (See Comments)    Jittery, jumpey  . Sulfonamide Derivatives     REACTION: Rash  . Tizanidine     hallucination    Current Outpatient  Prescriptions  Medication Sig Dispense Refill  . ACCU-CHEK SOFTCLIX LANCETS lancets Use to check blood sugar two times a day.  Dx:250.00 Non-Insulin 200 each 3  . albuterol (PROVENTIL HFA;VENTOLIN HFA) 108 (90 BASE) MCG/ACT inhaler 2 puffs every 4 hours as needed for wheezing. 1 Inhaler 0  . aspirin 81 MG tablet Take 81 mg by mouth as needed.    . Blood Glucose Monitoring Suppl (ACCU-CHEK AVIVA) device Use to check blood sugars two times a day.  Dx:250.00 Non-Insulin 1 each 0  . calcium carbonate (TUMS - DOSED IN MG ELEMENTAL CALCIUM) 500 MG chewable tablet Chew 1 tablet by mouth as needed.    . clobetasol ointment (TEMOVATE) 0.05 % Apply topically. Use 2 to 3 times weekly    . colchicine (COLCRYS) 0.6 MG tablet Take 1 tablet (0.6 mg total) by mouth daily. 30 tablet 5  . diclofenac sodium (VOLTAREN) 1 % GEL Apply 2 g topically 4 (four) times daily as needed. 300 g 11  . docusate sodium (COLACE) 100 MG capsule  Take 100 mg by mouth 2 (two) times daily.    Marland Kitchen glucose blood (ACCU-CHEK AVIVA PLUS) test strip Use to check blood sugar two times a day.  Dx:250.00 Non-Insulin 200 each 3  . ibuprofen (ADVIL,MOTRIN) 200 MG tablet Take 200 mg by mouth as needed.    . latanoprost (XALATAN) 0.005 % ophthalmic solution Place 1 drop into both eyes at bedtime.    Marland Kitchen losartan (COZAAR) 50 MG tablet Take 1 tablet (50 mg total) by mouth daily. 90 tablet 3  . metFORMIN (GLUCOPHAGE-XR) 500 MG 24 hr tablet TAKE 1 TABLET TWICE DAILY 180 tablet 1  . metoprolol (LOPRESSOR) 50 MG tablet TAKE 1 TABLET TWICE DAILY 180 tablet 1  . Multiple Vitamins-Minerals (CENTRUM ADULTS PO) Take 1 tablet by mouth daily.    . mupirocin ointment (BACTROBAN) 2 % Apply 1 application topically 2 (two) times daily. 22 g 0  . neomycin-bacitracin-polymyxin (NEOSPORIN) ointment Apply 1 application topically as needed for wound care.     . neomycin-polymyxin-hydrocortisone (CORTISPORIN) otic solution Place 3 drops into the left ear 4 (four) times daily. 10 mL 0  . omeprazole (PRILOSEC) 20 MG capsule Take 1 capsule (20 mg total) by mouth daily. 30 capsule 11  . RANITIDINE & DIET MANAGE PROD PO Take 2 tablets by mouth at bedtime.     . timolol (TIMOPTIC) 0.5 % ophthalmic solution Place 1 drop into both eyes daily.     . traMADol (ULTRAM) 50 MG tablet TAKE 1 TABLET FOUR TIMES DAILY 120 tablet 3  . triamcinolone cream (KENALOG) 0.1 % Apply 1 application topically 2 (two) times daily. 30 g 0  . venlafaxine XR (EFFEXOR-XR) 75 MG 24 hr capsule TAKE 1 CAPSULE EVERY DAY WITH BREAKFAST 90 capsule 1   No current facility-administered medications for this visit.       REVIEW OF SYSTEMS (Negative unless checked)  Constitutional: [] Weight loss  [] Fever  [] Chills Cardiac: [] Chest pain   [] Chest pressure   [] Palpitations   [] Shortness of breath when laying flat   [] Shortness of breath at rest   [] Shortness of breath with exertion. Vascular:  [x] Pain in legs with  walking   [] Pain in legs at rest   [] Pain in legs when laying flat   [] Claudication   [] Pain in feet when walking  [x] Pain in feet at rest  [x] Pain in feet when laying flat   [] History of DVT   [] Phlebitis   [x] Swelling in legs   []   Varicose veins   [] Non-healing ulcers Pulmonary:   [] Uses home oxygen   [] Productive cough   [] Hemoptysis   [] Wheeze  [] COPD   [] Asthma Neurologic:  [] Dizziness  [] Blackouts   [] Seizures   [] History of stroke   [] History of TIA  [] Aphasia   [] Temporary blindness   [] Dysphagia   [] Weakness or numbness in arms   [] Weakness or numbness in legs Musculoskeletal:  [] Arthritis   [] Joint swelling   [] Joint pain   [x] Low back pain Hematologic:  [] Easy bruising  [] Easy bleeding   [] Hypercoagulable state   [] Anemic  [] Hepatitis Gastrointestinal:  [] Blood in stool   [] Vomiting blood  [] Gastroesophageal reflux/heartburn   [] Abdominal pain Genitourinary:  [] Chronic kidney disease   [] Difficult urination  [] Frequent urination  [] Burning with urination   [] Hematuria Skin:  [] Rashes   [] Ulcers   [] Wounds Psychological:  [] History of anxiety   []  History of major depression.    Physical Exam BP (!) 150/74   Pulse 61   Resp 16   Ht 5\' 1"  (1.549 m)   Wt 80.3 kg (177 lb)   BMI 33.44 kg/m  Gen:  WD/WN, NAD Head: [place]/AT, No temporalis wasting.  Ear/Nose/Throat: Hearing grossly intact, nares w/o erythema or drainage, oropharynx w/o Erythema/Exudate Eyes: Conjunctiva clear, sclera non-icteric  Neck: trachea midline.  No JVD.  Pulmonary:  Good air movement, equal bilaterally, no use of accessory muscles Cardiac: RRR, normal S1, S2 Vascular:  Vessel Right Left  Radial Palpable Palpable  Ulnar Palpable Palpable  Brachial Palpable Palpable  Carotid Palpable, without bruit Palpable, without bruit  Aorta Not palpable N/A  Femoral Palpable Palpable  Popliteal Palpable Palpable  PT 1+ Palpable Not Palpable  DP 1+ Palpable Palpable   Gastrointestinal: soft, non-tender/non-distended.  No guarding/reflex. No masses, surgical incisions, or scars. Musculoskeletal: M/S 5/5 throughout.  Extremities without ischemic changes.  No deformity or atrophy. 1+ bilateral lower extremity edema. Scattered varicosities bilaterally. Walks with a cane Neurologic: Sensation grossly intact in extremities.  Symmetrical.  Speech is fluent. Motor exam as listed above. Psychiatric: Judgment intact, Mood & affect appropriate for pt's clinical situation. Dermatologic: No rashes or ulcers noted.  No cellulitis or open wounds. Lymph : No Cervical, Axillary, or Inguinal lymphadenopathy.   Radiology No results found.  Labs No results found for this or any previous visit (from the past 2160 hour(s)).  Assessment/Plan:  Essential hypertension, benign blood pressure control important in reducing the progression of atherosclerotic disease. On appropriate oral medications.   Type II diabetes mellitus, well controlled blood glucose control important in reducing the progression of atherosclerotic disease. Also, involved in wound healing. On appropriate medications.   Spinal stenosis of lumbar region Although this may be causing significant lower extremity pain symptoms, I do think is appropriate to assess her vascular system as well.  HYPERCHOLESTEROLEMIA lipid control important in reducing the progression of atherosclerotic disease. Continue statin therapy   Pain in limb The patient describes lower extremity symptoms that are not entirely clear in their etiology. Certainly neurogenic claudication is likely cause, but given her multiple atherosclerotic risk factors and the nature of her symptoms I would be suspicious of arterial disease as well. Swelling is common with venous disease as well, and I think a venous reflux study should be performed as well. We will see the patient back following these noninvasive studies to discuss the results and determine further treatment options.      Leotis Pain 06/04/2016, 4:05 PM   This note was created with [employer]  medical transcription system.  Any errors from dictation are unintentional.

## 2016-06-04 NOTE — Assessment & Plan Note (Signed)
lipid control important in reducing the progression of atherosclerotic disease. Continue statin therapy  

## 2016-06-04 NOTE — Assessment & Plan Note (Signed)
Although this may be causing significant lower extremity pain symptoms, I do think is appropriate to assess her vascular system as well.

## 2016-06-04 NOTE — Assessment & Plan Note (Signed)
blood pressure control important in reducing the progression of atherosclerotic disease. On appropriate oral medications.  

## 2016-06-04 NOTE — Patient Instructions (Signed)
Peripheral Vascular Disease Peripheral vascular disease (PVD) is a disease of the blood vessels that are not part of your heart and brain. A simple term for PVD is poor circulation. In most cases, PVD narrows the blood vessels that carry blood from your heart to the rest of your body. This can result in a decreased supply of blood to your arms, legs, and internal organs, like your stomach or kidneys. However, it most often affects a person's lower legs and feet. There are two types of PVD.  Organic PVD. This is the more common type. It is caused by damage to the structure of blood vessels.  Functional PVD. This is caused by conditions that make blood vessels contract and tighten (spasm). Without treatment, PVD tends to get worse over time. PVD can also lead to acute ischemic limb. This is when an arm or limb suddenly has trouble getting enough blood. This is a medical emergency. CAUSES Each type of PVD has many different causes. The most common cause of PVD is buildup of a fatty material (plaque) inside of your arteries (atherosclerosis). Small amounts of plaque can break off from the walls of the blood vessels and become lodged in a smaller artery. This blocks blood flow and can cause acute ischemic limb. Other common causes of PVD include:  Blood clots that form inside of blood vessels.  Injuries to blood vessels.  Diseases that cause inflammation of blood vessels or cause blood vessel spasms.  Health behaviors and health history that increase your risk of developing PVD. RISK FACTORS  You may have a greater risk of PVD if you:  Have a family history of PVD.  Have certain medical conditions, including:  High cholesterol.  Diabetes.  High blood pressure (hypertension).  Coronary heart disease.  Past problems with blood clots.  Past injury, such as burns or a broken bone. These may have damaged blood vessels in your limbs.  Buerger disease. This is caused by inflamed blood  vessels in your hands and feet.  Some forms of arthritis.  Rare birth defects that affect the arteries in your legs.  Use tobacco.  Do not get enough exercise.  Are obese.  Are age 50 or older. SIGNS AND SYMPTOMS  PVD may cause many different symptoms. Your symptoms depend on what part of your body is not getting enough blood. Some common signs and symptoms include:  Cramps in your lower legs. This may be a symptom of poor leg circulation (claudication).  Pain and weakness in your legs while you are physically active that goes away when you rest (intermittent claudication).  Leg pain when at rest.  Leg numbness, tingling, or weakness.  Coldness in a leg or foot, especially when compared with the other leg.  Skin or hair changes. These can include:  Hair loss.  Shiny skin.  Pale or bluish skin.  Thick toenails.  Inability to get or maintain an erection (erectile dysfunction). People with PVD are more prone to developing ulcers and sores on their toes, feet, or legs. These may take longer than normal to heal. DIAGNOSIS Your health care provider may diagnose PVD from your signs and symptoms. The health care provider will also do a physical exam. You may have tests to find out what is causing your PVD and determine its severity. Tests may include:  Blood pressure recordings from your arms and legs and measurements of the strength of your pulses (pulse volume recordings).  Imaging studies using sound waves to take pictures of   the blood flow through your blood vessels (Doppler ultrasound).  Injecting a dye into your blood vessels before having imaging studies using:  X-rays (angiogram or arteriogram).  Computer-generated X-rays (CT angiogram).  A powerful electromagnetic field and a computer (magnetic resonance angiogram or MRA). TREATMENT Treatment for PVD depends on the cause of your condition and the severity of your symptoms. It also depends on your age. Underlying  causes need to be treated and controlled. These include long-lasting (chronic) conditions, such as diabetes, high cholesterol, and high blood pressure. You may need to first try making lifestyle changes and taking medicines. Surgery may be needed if these do not work. Lifestyle changes may include:  Quitting smoking.  Exercising regularly.  Following a low-fat, low-cholesterol diet. Medicines may include:  Blood thinners to prevent blood clots.  Medicines to improve blood flow.  Medicines to improve your blood cholesterol levels. Surgical procedures may include:  A procedure that uses an inflated balloon to open a blocked artery and improve blood flow (angioplasty).  A procedure to put in a tube (stent) to keep a blocked artery open (stent implant).  Surgery to reroute blood flow around a blocked artery (peripheral bypass surgery).  Surgery to remove dead tissue from an infected wound on the affected limb.  Amputation. This is surgical removal of the affected limb. This may be necessary in cases of acute ischemic limb that are not improved through medical or surgical treatments. HOME CARE INSTRUCTIONS  Take medicines only as directed by your health care provider.  Do not use any tobacco products, including cigarettes, chewing tobacco, or electronic cigarettes. If you need help quitting, ask your health care provider.  Lose weight if you are overweight, and maintain a healthy weight as directed by your health care provider.  Eat a diet that is low in fat and cholesterol. If you need help, ask your health care provider.  Exercise regularly. Ask your health care provider to suggest some good activities for you.  Use compression stockings or other mechanical devices as directed by your health care provider.  Take good care of your feet.  Wear comfortable shoes that fit well.  Check your feet often for any cuts or sores. SEEK MEDICAL CARE IF:  You have cramps in your legs  while walking.  You have leg pain when you are at rest.  You have coldness in a leg or foot.  Your skin changes.  You have erectile dysfunction.  You have cuts or sores on your feet that are not healing. SEEK IMMEDIATE MEDICAL CARE IF:  Your arm or leg turns cold and blue.  Your arms or legs become red, warm, swollen, painful, or numb.  You have chest pain or trouble breathing.  You suddenly have weakness in your face, arm, or leg.  You become very confused or lose the ability to speak.  You suddenly have a very bad headache or lose your vision.   This information is not intended to replace advice given to you by your health care provider. Make sure you discuss any questions you have with your health care provider.   Document Released: 09/02/2004 Document Revised: 08/16/2014 Document Reviewed: 01/03/2014 Elsevier Interactive Patient Education 2016 Elsevier Inc.  

## 2016-06-04 NOTE — Assessment & Plan Note (Signed)
The patient describes lower extremity symptoms that are not entirely clear in their etiology. Certainly neurogenic claudication is likely cause, but given her multiple atherosclerotic risk factors and the nature of her symptoms I would be suspicious of arterial disease as well. Swelling is common with venous disease as well, and I think a venous reflux study should be performed as well. We will see the patient back following these noninvasive studies to discuss the results and determine further treatment options.

## 2016-06-14 DIAGNOSIS — H401131 Primary open-angle glaucoma, bilateral, mild stage: Secondary | ICD-10-CM | POA: Diagnosis not present

## 2016-06-19 ENCOUNTER — Other Ambulatory Visit: Payer: Self-pay | Admitting: Family Medicine

## 2016-06-24 DIAGNOSIS — E119 Type 2 diabetes mellitus without complications: Secondary | ICD-10-CM | POA: Diagnosis not present

## 2016-06-25 ENCOUNTER — Ambulatory Visit (INDEPENDENT_AMBULATORY_CARE_PROVIDER_SITE_OTHER): Payer: Commercial Managed Care - HMO

## 2016-06-25 ENCOUNTER — Encounter (INDEPENDENT_AMBULATORY_CARE_PROVIDER_SITE_OTHER): Payer: Self-pay | Admitting: Vascular Surgery

## 2016-06-25 ENCOUNTER — Ambulatory Visit (INDEPENDENT_AMBULATORY_CARE_PROVIDER_SITE_OTHER): Payer: Commercial Managed Care - HMO | Admitting: Vascular Surgery

## 2016-06-25 VITALS — BP 145/74 | HR 60 | Resp 16 | Ht 61.5 in | Wt 176.0 lb

## 2016-06-25 DIAGNOSIS — E119 Type 2 diabetes mellitus without complications: Secondary | ICD-10-CM | POA: Diagnosis not present

## 2016-06-25 DIAGNOSIS — M79605 Pain in left leg: Secondary | ICD-10-CM

## 2016-06-25 DIAGNOSIS — M79604 Pain in right leg: Secondary | ICD-10-CM

## 2016-06-25 DIAGNOSIS — I10 Essential (primary) hypertension: Secondary | ICD-10-CM | POA: Diagnosis not present

## 2016-06-25 NOTE — Assessment & Plan Note (Signed)
The patient's noninvasive studies today demonstrate normal ABIs of 1.11 on the right and 1.06 on the left. Her waveforms are brisk. Her digital pressures are mildly elevated likely secondary to some small vessel disease, but overall her lower extremity perfusion is reasonably normal. Her venous duplex reveals no evidence of deep venous thrombosis or superficial thrombophlebitis in either lower extremity. Her right lower extremity demonstrates no reflux. Her left lower extremity demonstrates minimal and significant reflux over a very Heber segment in the left calf. This is unlikely to be the cause of her pain. With these findings, her lower extremity pain is almost certainly neuropathic or musculoskeletal in nature. No further vascular evaluation or workup is needed. I will see her back as needed.

## 2016-06-25 NOTE — Progress Notes (Signed)
MRN : [id number]  Cathy Mullins is a 70 y.o. (1946-02-04) female who presents with chief complaint of  Chief Complaint  Patient presents with  . Re-evaluation    Ultrasound follow up  .  History of Present Illness: Patient returns today in follow up of her leg pain.  It is about the same with no major changes since her last visit. The patient's noninvasive studies today demonstrate normal ABIs of 1.11 on the right and 1.06 on the left. Her waveforms are brisk. Her digital pressures are mildly elevated likely secondary to some small vessel disease, but overall her lower extremity perfusion is reasonably normal. Her venous duplex reveals no evidence of deep venous thrombosis or superficial thrombophlebitis in either lower extremity. Her right lower extremity demonstrates no reflux. Her left lower extremity demonstrates minimal and significant reflux over a very [place]er segment in the left calf. This is unlikely to be the cause of her pain.   Past Medical History:  Diagnosis Date  . Allergy   . Asthma with COPD ([place])   . Diabetes mellitus   . Esophagitis   . Fibromyalgia   . GERD (gastroesophageal reflux disease)   . Glaucoma   . History of colon polyps   . HTN (hypertension)   . Hyperlipidemia   . Lichen sclerosus   . Perforating folliculitis   . Spinal stenosis   . Tachycardia   . TIA (transient ischemic attack)          Past Surgical History:  Procedure Laterality Date  . ABDOMINAL HYSTERECTOMY     right ovary remaining  . APPENDECTOMY    . BREAST CYST ASPIRATION Bilateral   . BREAST EXCISIONAL BIOPSY Right 1982   NEG  . BREAST SURGERY     right(benign)  . CARPAL TUNNEL RELEASE    . EPIDURAL BLOCK INJECTION  2013  . KNEE ARTHROSCOPY W/ MENISCAL REPAIR     left  . LIPOMA EXCISION     2 times left leg  . lumpectomy    . TONSILLECTOMY    . TRIGGER FINGER RELEASE            Family History  Problem Relation Age of Onset    . Cancer Mother     breast  . Stroke Mother   . Valvular heart disease Mother   . Breast cancer Mother   . Kidney disease Father   . Stroke Father   . Coronary artery disease Father   . Diabetes Brother   . Colon cancer Neg Hx   . Pancreatic cancer Neg Hx   . Stomach cancer Neg Hx      Social History      Social History  Substance Use Topics  . Smoking status: Former Smoker    Quit date: 09/10/1988  . Smokeless tobacco: Never Used  . Alcohol use No   No IV drug use       Allergies  Allergen Reactions  . Amoxicillin-Pot Clavulanate   . Codeine     REACTION: Gives her migraines  . Hydrocodone-Acetaminophen   . Lovastatin     REACTION: myalgias  . Prednisone Other (See Comments)    Jittery, jumpey  . Sulfonamide Derivatives     REACTION: Rash  . Tizanidine     hallucination          Current Outpatient Prescriptions  Medication Sig Dispense Refill  . ACCU-CHEK SOFTCLIX LANCETS lancets Use to check blood sugar two times a day.  Dx:250.00 Non-Insulin  200 each 3  . albuterol (PROVENTIL HFA;VENTOLIN HFA) 108 (90 BASE) MCG/ACT inhaler 2 puffs every 4 hours as needed for wheezing. 1 Inhaler 0  . aspirin 81 MG tablet Take 81 mg by mouth as needed.    . Blood Glucose Monitoring Suppl (ACCU-CHEK AVIVA) device Use to check blood sugars two times a day.  Dx:250.00 Non-Insulin 1 each 0  . calcium carbonate (TUMS - DOSED IN MG ELEMENTAL CALCIUM) 500 MG chewable tablet Chew 1 tablet by mouth as needed.    . clobetasol ointment (TEMOVATE) 0.05 % Apply topically. Use 2 to 3 times weekly    . colchicine (COLCRYS) 0.6 MG tablet Take 1 tablet (0.6 mg total) by mouth daily. 30 tablet 5  . diclofenac sodium (VOLTAREN) 1 % GEL Apply 2 g topically 4 (four) times daily as needed. 300 g 11  . docusate sodium (COLACE) 100 MG capsule Take 100 mg by mouth 2 (two) times daily.    Marland Kitchen glucose blood (ACCU-CHEK AVIVA PLUS) test strip Use to check blood  sugar two times a day.  Dx:250.00 Non-Insulin 200 each 3  . ibuprofen (ADVIL,MOTRIN) 200 MG tablet Take 200 mg by mouth as needed.    . latanoprost (XALATAN) 0.005 % ophthalmic solution Place 1 drop into both eyes at bedtime.    Marland Kitchen losartan (COZAAR) 50 MG tablet Take 1 tablet (50 mg total) by mouth daily. 90 tablet 3  . metFORMIN (GLUCOPHAGE-XR) 500 MG 24 hr tablet TAKE 1 TABLET TWICE DAILY 180 tablet 1  . metoprolol (LOPRESSOR) 50 MG tablet TAKE 1 TABLET TWICE DAILY 180 tablet 1  . Multiple Vitamins-Minerals (CENTRUM ADULTS PO) Take 1 tablet by mouth daily.    . mupirocin ointment (BACTROBAN) 2 % Apply 1 application topically 2 (two) times daily. 22 g 0  . neomycin-bacitracin-polymyxin (NEOSPORIN) ointment Apply 1 application topically as needed for wound care.     . neomycin-polymyxin-hydrocortisone (CORTISPORIN) otic solution Place 3 drops into the left ear 4 (four) times daily. 10 mL 0  . omeprazole (PRILOSEC) 20 MG capsule Take 1 capsule (20 mg total) by mouth daily. 30 capsule 11  . RANITIDINE & DIET MANAGE PROD PO Take 2 tablets by mouth at bedtime.     . timolol (TIMOPTIC) 0.5 % ophthalmic solution Place 1 drop into both eyes daily.     . traMADol (ULTRAM) 50 MG tablet TAKE 1 TABLET FOUR TIMES DAILY 120 tablet 3  . triamcinolone cream (KENALOG) 0.1 % Apply 1 application topically 2 (two) times daily. 30 g 0  . venlafaxine XR (EFFEXOR-XR) 75 MG 24 hr capsule TAKE 1 CAPSULE EVERY DAY WITH BREAKFAST 90 capsule 1   No current facility-administered medications for this visit.       REVIEW OF SYSTEMS (Negative unless checked)  Constitutional: [] Weight loss  [] Fever  [] Chills Cardiac: [] Chest pain   [] Chest pressure   [] Palpitations   [] Shortness of breath when laying flat   [] Shortness of breath at rest   [] Shortness of breath with exertion. Vascular:  [x] Pain in legs with walking   [] Pain in legs at rest   [] Pain in legs when laying flat   [] Claudication   [] Pain in feet  when walking  [x] Pain in feet at rest  [x] Pain in feet when laying flat   [] History of DVT   [] Phlebitis   [x] Swelling in legs   [] Varicose veins   [] Non-healing ulcers Pulmonary:   [] Uses home oxygen   [] Productive cough   [] Hemoptysis   [] Wheeze  [] COPD   []   Asthma Neurologic:  [] Dizziness  [] Blackouts   [] Seizures   [] History of stroke   [] History of TIA  [] Aphasia   [] Temporary blindness   [] Dysphagia   [] Weakness or numbness in arms   [] Weakness or numbness in legs Musculoskeletal:  [] Arthritis   [] Joint swelling   [] Joint pain   [x] Low back pain Hematologic:  [] Easy bruising  [] Easy bleeding   [] Hypercoagulable state   [] Anemic  [] Hepatitis Gastrointestinal:  [] Blood in stool   [] Vomiting blood  [] Gastroesophageal reflux/heartburn   [] Abdominal pain Genitourinary:  [] Chronic kidney disease   [] Difficult urination  [] Frequent urination  [] Burning with urination   [] Hematuria Skin:  [] Rashes   [] Ulcers   [] Wounds Psychological:  [] History of anxiety   []  History of major depression.    Physical Exam BP (!) 150/74   Pulse 61   Resp 16   Ht 5\' 1"  (1.549 m)   Wt 80.3 kg (177 lb)   BMI 33.44 kg/m  Gen:  WD/WN, NAD Head: [place]/AT, No temporalis wasting.  Ear/Nose/Throat: Hearing grossly intact, nares w/o erythema or drainage, oropharynx w/o Erythema/Exudate Eyes: Conjunctiva clear, sclera non-icteric  Neck: trachea midline.  No JVD.  Pulmonary:  Good air movement, equal bilaterally, no use of accessory muscles Cardiac: RRR, normal S1, S2 Vascular:  Vessel Right Left  Radial Palpable Palpable  Ulnar Palpable Palpable  Brachial Palpable Palpable  Carotid Palpable, without bruit Palpable, without bruit  Aorta Not palpable N/A  Femoral Palpable Palpable  Popliteal Palpable Palpable  PT 1+ Palpable Not Palpable  DP 1+ Palpable Palpable   Gastrointestinal: soft, non-tender/non-distended. No guarding/reflex. No masses, surgical incisions, or scars. Musculoskeletal: M/S 5/5 throughout.   Extremities without ischemic changes.  No deformity or atrophy. 1+ bilateral lower extremity edema. Scattered varicosities bilaterally. Walks with a cane Neurologic: Sensation grossly intact in extremities.  Symmetrical.  Speech is fluent. Motor exam as listed above. Psychiatric: Judgment intact, Mood & affect appropriate for pt's clinical situation. Dermatologic: No rashes or ulcers noted.  No cellulitis or open wounds. Lymph : No Cervical, Axillary, or Inguinal lymphadenopathy.     Labs No results found for this or any previous visit (from the past 2160 hour(s)).  Radiology No results found.  Outside Studies/Documentation   Assessment/Plan  Type II diabetes mellitus, well controlled blood glucose control important in reducing the progression of atherosclerotic disease. Also, involved in wound healing. On appropriate medications.   Essential hypertension, benign blood pressure control important in reducing the progression of atherosclerotic disease. On appropriate oral medications.   Pain in limb The patient's noninvasive studies today demonstrate normal ABIs of 1.11 on the right and 1.06 on the left. Her waveforms are brisk. Her digital pressures are mildly elevated likely secondary to some small vessel disease, but overall her lower extremity perfusion is reasonably normal. Her venous duplex reveals no evidence of deep venous thrombosis or superficial thrombophlebitis in either lower extremity. Her right lower extremity demonstrates no reflux. Her left lower extremity demonstrates minimal and significant reflux over a very [place] segment in the left calf. This is unlikely to be the cause of her pain. With these findings, her lower extremity pain is almost certainly neuropathic or musculoskeletal in nature. No further vascular evaluation or workup is needed. I will see her back as needed.    Leotis Pain, MD  06/25/2016 11:04 AM    This note was created with Dragon medical  transcription system.  Any errors from dictation are purely unintentional

## 2016-06-25 NOTE — Assessment & Plan Note (Signed)
blood glucose control important in reducing the progression of atherosclerotic disease. Also, involved in wound healing. On appropriate medications.  

## 2016-06-25 NOTE — Assessment & Plan Note (Signed)
blood pressure control important in reducing the progression of atherosclerotic disease. On appropriate oral medications.  

## 2016-07-06 ENCOUNTER — Telehealth: Payer: Self-pay | Admitting: *Deleted

## 2016-07-06 DIAGNOSIS — L720 Epidermal cyst: Secondary | ICD-10-CM

## 2016-07-06 NOTE — Telephone Encounter (Signed)
Patient left a voicemail stating that you are aware of the cyst that she has on her jaw. Patient stated that it has gotten larger and is infected. Patient stated that it needs to be opened and drained. Patient requested a referral to a dermatologist and does not want to go to [facility] because they are only like a spa now per the patient.

## 2016-07-21 ENCOUNTER — Ambulatory Visit (INDEPENDENT_AMBULATORY_CARE_PROVIDER_SITE_OTHER): Payer: Commercial Managed Care - HMO | Admitting: Vascular Surgery

## 2016-07-21 ENCOUNTER — Other Ambulatory Visit: Payer: Self-pay | Admitting: Family Medicine

## 2016-07-21 ENCOUNTER — Encounter (INDEPENDENT_AMBULATORY_CARE_PROVIDER_SITE_OTHER): Payer: Commercial Managed Care - HMO

## 2016-07-30 ENCOUNTER — Other Ambulatory Visit: Payer: Self-pay

## 2016-07-30 MED ORDER — TRAMADOL HCL 50 MG PO TABS: ORAL_TABLET | ORAL | 3 refills | Status: DC

## 2016-07-30 NOTE — Telephone Encounter (Signed)
Pt left v/m requesting tramadol refilled to [facility].pt has 2 weeks of med left but wanted time to get rx to [facility]. Last refilled # 120 x 3 on 04/02/16. Pt last f/u 01/08/16.

## 2016-08-03 NOTE — Telephone Encounter (Signed)
Tramadol prescription has been faxed to [facility] mail order as instructed.

## 2016-09-16 DIAGNOSIS — L538 Other specified erythematous conditions: Secondary | ICD-10-CM | POA: Diagnosis not present

## 2016-09-16 DIAGNOSIS — L720 Epidermal cyst: Secondary | ICD-10-CM | POA: Diagnosis not present

## 2016-09-22 ENCOUNTER — Other Ambulatory Visit: Payer: Self-pay | Admitting: Family Medicine

## 2016-10-08 ENCOUNTER — Telehealth: Payer: Self-pay | Admitting: Family Medicine

## 2016-10-08 NOTE — Telephone Encounter (Signed)
Agree she need emergent eval.

## 2016-10-08 NOTE — Telephone Encounter (Signed)
DOB: 1945-10-14 Initial Comment Caller is a diabetic and has had very severe leg and foot pain. They have done several test and haven't found anything. Her right foot has gotten a little better but her left foot is greyish blue today. She has only been able to sleep 2-3 hours everyday. Nurse Assessment Nurse: Germain Osgood RN, Opal Sidles Date/Time Eilene Ghazi Time): 10/08/2016 4:41:44 PM Confirm and document reason for call. If symptomatic, describe symptoms. ---Caller is a diabetic and has had very severe leg and foot pain. They have done several test and haven't found anything. Her right foot has gotten a little better but her left foot is grayish greenblue today. She has only been able to sleep 2-3 hours everyday. Greenish - gray color behind great toe and into arch. Does the patient have any new or worsening symptoms? ---Yes Will a triage be completed? ---Yes Related visit to physician within the last 2 weeks? ---No Does the PT have any chronic conditions? (i.e. diabetes, asthma, etc.) ---Yes List chronic conditions. ---Diabetes Is this a behavioral health or substance abuse call? ---No Guidelines Guideline Title Affirmed Question Affirmed Notes Diabetes - Foot Problems and Questions Severe pain Final Disposition User Go to ED Now Germain Osgood, RN, Opal Sidles Comments Caller reports if she comes in it will be tomorrow when has transportation Referrals [facility] - ED Disagree/Comply: Disagree Disagree/Comply Reason: Disagree with instructions

## 2016-10-11 NOTE — Telephone Encounter (Signed)
I spoke with pt and she did not see anyone over the weekend; pt said she has pseudogout and it is slightly better today; offered pt appt at [facility] but she declined and said if she decides to see someone she will get appt with podiatrist. FYI to Dr Diona Browner.

## 2016-11-05 ENCOUNTER — Ambulatory Visit: Payer: Commercial Managed Care - HMO | Admitting: Podiatry

## 2016-11-05 ENCOUNTER — Ambulatory Visit (INDEPENDENT_AMBULATORY_CARE_PROVIDER_SITE_OTHER): Payer: Commercial Managed Care - HMO | Admitting: Podiatry

## 2016-11-05 ENCOUNTER — Ambulatory Visit (INDEPENDENT_AMBULATORY_CARE_PROVIDER_SITE_OTHER): Payer: Commercial Managed Care - HMO

## 2016-11-05 DIAGNOSIS — M79672 Pain in left foot: Secondary | ICD-10-CM | POA: Diagnosis not present

## 2016-11-05 DIAGNOSIS — R52 Pain, unspecified: Secondary | ICD-10-CM

## 2016-11-05 DIAGNOSIS — M722 Plantar fascial fibromatosis: Secondary | ICD-10-CM | POA: Diagnosis not present

## 2016-11-05 MED ORDER — BETAMETHASONE SOD PHOS & ACET 6 (3-3) MG/ML IJ SUSP: 3.0000 mg | Freq: Once | INTRAMUSCULAR | Status: DC

## 2016-11-05 MED ORDER — NONFORMULARY OR COMPOUNDED ITEM: 2 refills | Status: DC

## 2016-11-05 NOTE — Progress Notes (Signed)
   Subjective: Patient presents today for pain and tenderness in the left foot. Patient states the foot pain has been hurting for several weeks now. Patient states that it hurts in the mornings with the first steps out of bed. Patient presents today for further treatment and evaluation  Objective: Physical Exam General: The patient is alert and oriented x3 in no acute distress.  Dermatology: Skin is warm, dry and supple bilateral lower extremities. Negative for open lesions or macerations bilateral.   Vascular: Dorsalis Pedis and Posterior Tibial pulses palpable bilateral.  Capillary fill time is immediate to all digits.  Neurological: Epicritic and protective threshold intact bilateral.   Musculoskeletal: Tenderness to palpation at the medial calcaneal tubercale and through the insertion of the plantar fascia of the left foot. All other joints range of motion within normal limits bilateral. Strength 5/5 in all groups bilateral.   Radiographic exam:   Normal osseous mineralization. Joint spaces preserved. No fracture/dislocation/boney destruction. Calcaneal spur present with mild thickening of plantar fascia left. No other soft tissue abnormalities or radiopaque foreign bodies.   Assessment: 1. Plantar fasciitis left foot 2. Pain in left foot  Plan of Care:   1. Patient evaluated. Xrays reviewed.   2. Injection of 0.5cc Celestone soluspan injected into the left plantar fascia.  3. Instructed patient regarding therapies and modalities at home to alleviate symptoms.  4. Patient cannot tolerate oral NSAIDS  5. Rx for anti-inflammatory pain cream dispensed through [facility].  6. Plantar fascial band(s) dispensed. 7. Patient is currently taking tramadol for spinal pain  8. Continue over-the-counter insoles  9. Return to clinic in 4 weeks.     Edrick Kins, DPM [facility]  Dr. Edrick Kins, [address]                                         [place], [place] [address]                Office [phone]  Fax [phone]

## 2016-11-08 ENCOUNTER — Telehealth: Payer: Self-pay | Admitting: *Deleted

## 2016-11-08 NOTE — Telephone Encounter (Signed)
Pt states she hasn't heard anything from [facility] concerning her compound. I spoke with Cresenciano Genre and she states she is pretty sure she has the pt's information and it is ready to go out, but she will contact the pt with information.

## 2016-11-25 ENCOUNTER — Other Ambulatory Visit: Payer: Self-pay | Admitting: Family Medicine

## 2016-11-25 NOTE — Telephone Encounter (Signed)
Tramadol Rx faxed to [facility] [phone]. 

## 2016-11-25 NOTE — Telephone Encounter (Signed)
Last office visit 05/20/2016 with Dr. Lorelei Pont. Last refill 07/30/2016 for #120 with 3 refills.

## 2016-11-30 ENCOUNTER — Telehealth: Payer: Self-pay | Admitting: Family Medicine

## 2016-11-30 NOTE — Telephone Encounter (Signed)
Left pt message asking to call Allison back directly at [phone] to schedule [name].+ labs with Lesia and CPE with PCP. °

## 2016-11-30 NOTE — Telephone Encounter (Signed)
Scheduled 12/08/16 °

## 2016-12-03 ENCOUNTER — Encounter: Payer: Self-pay | Admitting: Podiatry

## 2016-12-03 ENCOUNTER — Ambulatory Visit (INDEPENDENT_AMBULATORY_CARE_PROVIDER_SITE_OTHER): Payer: Medicare HMO | Admitting: Podiatry

## 2016-12-03 DIAGNOSIS — M722 Plantar fascial fibromatosis: Secondary | ICD-10-CM | POA: Diagnosis not present

## 2016-12-03 DIAGNOSIS — M79672 Pain in left foot: Secondary | ICD-10-CM

## 2016-12-03 MED ORDER — METHYLPREDNISOLONE 4 MG PO TABS: ORAL_TABLET | ORAL | 0 refills | Status: DC

## 2016-12-05 MED ORDER — BETAMETHASONE SOD PHOS & ACET 6 (3-3) MG/ML IJ SUSP: 3.0000 mg | Freq: Once | INTRAMUSCULAR | Status: DC

## 2016-12-05 NOTE — Progress Notes (Signed)
   Subjective: Patient presents today for follow up evaluation of left plantar fasciitis.She reports the pain is a little better. She states she is having continued sharp pains from the plantar heel that radiates up her LLE. She states the injection helped alleviate her pain for about one week. She reports a new complaint of intermittent burning and redness to the bilateral feet and toes that has been ongoing for several years. She states these symptoms are often worse at night. She has not done anything to treat these symptoms.  Objective: Physical Exam General: The patient is alert and oriented x3 in no acute distress.  Dermatology: Skin is warm, dry and supple bilateral lower extremities. Negative for open lesions or macerations bilateral.   Vascular: Dorsalis Pedis and Posterior Tibial pulses palpable bilateral.  Capillary fill time is immediate to all digits.  Neurological: Epicritic and protective threshold intact bilateral.   Musculoskeletal: Tenderness to palpation at the medial calcaneal tubercale and through the insertion of the plantar fascia of the left foot. All other joints range of motion within normal limits bilateral. Strength 5/5 in all groups bilateral.    Assessment: 1. Plantar fasciitis left foot 2. Pain in left foot  Plan of Care:   1. Patient evaluated. Xrays reviewed.   2. Injection of 0.5cc Celestone soluspan injected into the left plantar fascia.  3. Instructed patient regarding therapies and modalities at home to alleviate symptoms.  4. Prescription for Medrol Dosepak given 5. [place] CAM boot dispensed 6. Today we discussed EPAT and physical therapy 7. Return to clinic in 4 weeks     Edrick Kins, DPM [facility]  Dr. Edrick Kins, [address]                                        [place], [place] [address]                Office [phone]  Fax [phone]

## 2016-12-06 ENCOUNTER — Telehealth: Payer: Self-pay

## 2016-12-06 NOTE — Telephone Encounter (Signed)
Pt left v/m; pts mail order pharmacy has sent tramadol rx to pt but pt has not received it and [place] postal service is not sure where the tramadol is. Pt is completely out of tramadol; pt said [place] [employer] hopes that tramadol will be to pts home on 12/25/16; then pt said again that [place] [employer] is not sure where the tramadol are. Pt is having problems with foot; pt is wearing a boot and walking with cane; pt request new partial rx for tramadol to [facility]. Pt request cb.

## 2016-12-07 ENCOUNTER — Telehealth: Payer: Self-pay | Admitting: Family Medicine

## 2016-12-07 DIAGNOSIS — E7800 Pure hypercholesterolemia, unspecified: Secondary | ICD-10-CM

## 2016-12-07 DIAGNOSIS — E119 Type 2 diabetes mellitus without complications: Secondary | ICD-10-CM

## 2016-12-07 LAB — HM DIABETES EYE EXAM

## 2016-12-07 MED ORDER — TRAMADOL HCL 50 MG PO TABS: ORAL_TABLET | ORAL | 0 refills | Status: DC

## 2016-12-07 NOTE — Telephone Encounter (Signed)
Patient called back about rx.  Please call patient when rx is sent to [facility].

## 2016-12-07 NOTE — Telephone Encounter (Signed)
Tramadol called into [facility].  Ms. Muckey notified by telephone.

## 2016-12-07 NOTE — Telephone Encounter (Signed)
-----   Message from Eustace Pen, LPN sent at 2/77/8242  4:24 PM EDT ----- Regarding: Labs 5/2 Please place lab orders. Thank you.  [place]

## 2016-12-07 NOTE — Telephone Encounter (Signed)
Sent in rx to last until 5/19.

## 2016-12-08 ENCOUNTER — Ambulatory Visit: Payer: Commercial Managed Care - HMO

## 2016-12-09 ENCOUNTER — Encounter: Payer: Commercial Managed Care - HMO | Admitting: Family Medicine

## 2016-12-21 DIAGNOSIS — H401131 Primary open-angle glaucoma, bilateral, mild stage: Secondary | ICD-10-CM | POA: Diagnosis not present

## 2016-12-31 ENCOUNTER — Ambulatory Visit (INDEPENDENT_AMBULATORY_CARE_PROVIDER_SITE_OTHER): Payer: Medicare HMO | Admitting: Podiatry

## 2016-12-31 DIAGNOSIS — M7662 Achilles tendinitis, left leg: Secondary | ICD-10-CM | POA: Diagnosis not present

## 2016-12-31 DIAGNOSIS — M722 Plantar fascial fibromatosis: Secondary | ICD-10-CM

## 2016-12-31 DIAGNOSIS — E0842 Diabetes mellitus due to underlying condition with diabetic polyneuropathy: Secondary | ICD-10-CM

## 2016-12-31 MED ORDER — NONFORMULARY OR COMPOUNDED ITEM: 2 refills | Status: DC

## 2017-01-03 NOTE — Progress Notes (Signed)
   Subjective: Patient presents today for follow up evaluation of left plantar fasciitis. She states she is doing a lot better. She still wears the boot on occasion. She reports that she still has burning pain in the Achilles area intermittently. She states the injection helped alleviate the pain.  Objective: Physical Exam General: The patient is alert and oriented x3 in no acute distress.  Dermatology: Skin is warm, dry and supple bilateral lower extremities. Negative for open lesions or macerations bilateral.   Vascular: Dorsalis Pedis and Posterior Tibial pulses palpable bilateral.  Capillary fill time is immediate to all digits.  Neurological: Epicritic and protective threshold intact bilateral.   Musculoskeletal: Tenderness to palpation at the medial calcaneal tubercale and through the insertion of the plantar fascia of the left foot. All other joints range of motion within normal limits bilateral. Strength 5/5 in all groups bilateral.    Assessment: 1. Plantar fasciitis left foot 2. Pain in left foot 3. Achilles tendinitis left 4.DM with neuropathy  Plan of Care:  1. Patient evaluated.   2. Injection of 0.5cc Celestone soluspan injected into the left plantar fascia.  3. Prescription for anti-inflammatory pain cream to be dispensed from [facility]. 4. Continue wearing cam boot when necessary. 5. Did not take Medrol Dosepak and does not want oral medication of any kind. 6. Return to clinic in 4 weeks.  Edrick Kins, DPM [facility]  Dr. Edrick Kins, [address]                                        [place], [place] [address]                Office [phone]  Fax [phone]

## 2017-01-06 MED ORDER — BETAMETHASONE SOD PHOS & ACET 6 (3-3) MG/ML IJ SUSP: 3.0000 mg | Freq: Once | INTRAMUSCULAR | Status: DC

## 2017-01-18 ENCOUNTER — Telehealth: Payer: Self-pay

## 2017-01-18 MED ORDER — CLOBETASOL PROPIONATE 0.05 % EX OINT: TOPICAL_OINTMENT | Freq: Two times a day (BID) | CUTANEOUS | 0 refills | Status: DC

## 2017-01-18 NOTE — Telephone Encounter (Signed)
Pt left v/m; pt has flare up of lichen sclerosus; pt no longer sees GYN. Pt has pain,swelling,burning and itching of perineal area which started about 1 1/2 weeks ago and progressively worsened. Pt not able to sleep due to symptoms. Pt request refill clobetasol ointment or cream to [facility]. Pt request cb. Pt last saw Dr Diona Browner on 03/02/16.  Clobetasol ointment is on current med list but I do not see where Dr Diona Browner has filled the ointment.Please advise.

## 2017-01-18 NOTE — Telephone Encounter (Signed)
Okay to refill clobetasol but pt needs  appt for  AMW with labs prior if due.

## 2017-01-18 NOTE — Telephone Encounter (Addendum)
Cathy Mullins notified by telephone that a refill for the clobetasol ointment has been sent into her pharmacy.  AMW visit already scheduled for later this month.

## 2017-02-01 ENCOUNTER — Encounter: Payer: Self-pay | Admitting: Family Medicine

## 2017-02-04 ENCOUNTER — Ambulatory Visit (INDEPENDENT_AMBULATORY_CARE_PROVIDER_SITE_OTHER): Payer: Medicare HMO

## 2017-02-04 ENCOUNTER — Other Ambulatory Visit (INDEPENDENT_AMBULATORY_CARE_PROVIDER_SITE_OTHER): Payer: Medicare HMO

## 2017-02-04 VITALS — BP 130/70 | HR 60 | Temp 97.7°F | Ht 61.5 in | Wt 178.0 lb

## 2017-02-04 DIAGNOSIS — R5382 Chronic fatigue, unspecified: Secondary | ICD-10-CM | POA: Diagnosis not present

## 2017-02-04 DIAGNOSIS — I10 Essential (primary) hypertension: Secondary | ICD-10-CM | POA: Diagnosis not present

## 2017-02-04 DIAGNOSIS — L659 Nonscarring hair loss, unspecified: Secondary | ICD-10-CM | POA: Diagnosis not present

## 2017-02-04 DIAGNOSIS — E559 Vitamin D deficiency, unspecified: Secondary | ICD-10-CM | POA: Diagnosis not present

## 2017-02-04 DIAGNOSIS — E119 Type 2 diabetes mellitus without complications: Secondary | ICD-10-CM | POA: Diagnosis not present

## 2017-02-04 DIAGNOSIS — Z0000 Encounter for general adult medical examination without abnormal findings: Secondary | ICD-10-CM | POA: Diagnosis not present

## 2017-02-04 DIAGNOSIS — E7800 Pure hypercholesterolemia, unspecified: Secondary | ICD-10-CM | POA: Diagnosis not present

## 2017-02-04 LAB — COMPREHENSIVE METABOLIC PANEL
ALK PHOS: 60 U/L (ref 39–117)
ALT: 16 U/L (ref 0–35)
AST: 19 U/L (ref 0–37)
Albumin: 4.2 g/dL (ref 3.5–5.2)
BUN: 15 mg/dL (ref 6–23)
CALCIUM: 10.2 mg/dL (ref 8.4–10.5)
CO2: 29 meq/L (ref 19–32)
Chloride: 101 mEq/L (ref 96–112)
Creatinine, Ser: 1.25 mg/dL — ABNORMAL HIGH (ref 0.40–1.20)
GFR: 44.93 mL/min — AB (ref >60.00)
GLUCOSE: 130 mg/dL — AB (ref 70–99)
POTASSIUM: 4.7 meq/L (ref 3.5–5.1)
Sodium: 137 mEq/L (ref 135–145)
Total Bilirubin: 0.3 mg/dL (ref 0.2–1.2)
Total Protein: 7.7 g/dL (ref 6.0–8.3)

## 2017-02-04 LAB — LIPID PANEL
CHOL/HDL RATIO: 4
Cholesterol: 228 mg/dL — ABNORMAL HIGH (ref 0–200)
HDL: 58.9 mg/dL (ref >39.00)
LDL Cholesterol: 130 mg/dL — ABNORMAL HIGH (ref 0–99)
NONHDL: 169.45
Triglycerides: 199 mg/dL — ABNORMAL HIGH (ref 0.0–149.0)
VLDL: 39.8 mg/dL (ref 0.0–40.0)

## 2017-02-04 LAB — T4, FREE: FREE T4: 0.73 ng/dL (ref 0.60–1.60)

## 2017-02-04 LAB — T3, FREE: T3 FREE: 3.5 pg/mL (ref 2.3–4.2)

## 2017-02-04 LAB — VITAMIN B12: Vitamin B-12: 382 pg/mL (ref 211–911)

## 2017-02-04 LAB — TSH: TSH: 6.13 u[IU]/mL — ABNORMAL HIGH (ref 0.35–4.50)

## 2017-02-04 LAB — HEMOGLOBIN A1C: Hgb A1c MFr Bld: 6.9 % — ABNORMAL HIGH (ref 4.6–6.5)

## 2017-02-04 LAB — VITAMIN D 25 HYDROXY (VIT D DEFICIENCY, FRACTURES): VITD: 38.54 ng/mL (ref 30.00–100.00)

## 2017-02-04 NOTE — Progress Notes (Signed)
Pre visit review using our clinic review tool, if applicable. No additional management support is needed unless otherwise documented below in the visit note. 

## 2017-02-04 NOTE — Progress Notes (Signed)
Subjective:   Cathy Mullins is a 71 y.o. female who presents for Medicare Annual (Subsequent) preventive examination.  Review of Systems:  N/A Cardiac Risk Factors include: advanced age (>64men, >11 women);dyslipidemia;diabetes mellitus;hypertension;obesity (BMI >30kg/m2)     Objective:     Vitals: BP 130/70 (BP Location: Right Arm, Patient Position: Sitting, Cuff Size: Normal)   Pulse 60   Temp 97.7 F (36.5 C) (Oral)   Ht 5' 1.5" (1.562 m) Comment: no shoes  Wt 178 lb (80.7 kg)   SpO2 96%   BMI 33.09 kg/m   Body mass index is 33.09 kg/m.   Tobacco History  Smoking Status  . Former Smoker  . Quit date: 09/10/1988  Smokeless Tobacco  . Never Used     Counseling given: No   Past Medical History:  Diagnosis Date  . Allergy   . Asthma with COPD ([place])   . Diabetes mellitus   . Esophagitis   . Fibromyalgia   . GERD (gastroesophageal reflux disease)   . Glaucoma   . History of colon polyps   . HTN (hypertension)   . Hyperlipidemia   . Lichen sclerosus   . Perforating folliculitis   . Spinal stenosis   . Tachycardia   . TIA (transient ischemic attack)    Past Surgical History:  Procedure Laterality Date  . ABDOMINAL HYSTERECTOMY     right ovary remaining  . APPENDECTOMY    . BREAST CYST ASPIRATION Bilateral   . BREAST EXCISIONAL BIOPSY Right 1982   NEG  . BREAST SURGERY     right(benign)  . CARPAL TUNNEL RELEASE    . EPIDURAL BLOCK INJECTION  2013  . KNEE ARTHROSCOPY W/ MENISCAL REPAIR     left  . LIPOMA EXCISION     2 times left leg  . lumpectomy    . TONSILLECTOMY    . TRIGGER FINGER RELEASE     Family History  Problem Relation Age of Onset  . Cancer Mother        breast  . Stroke Mother   . Valvular heart disease Mother   . Breast cancer Mother   . Kidney disease Father   . Stroke Father   . Coronary artery disease Father   . Diabetes Brother   . Colon cancer Neg Hx   . Pancreatic cancer Neg Hx   . Stomach cancer Neg Hx    History    Sexual Activity  . Sexual activity: Not on file    Outpatient Encounter Prescriptions as of 02/04/2017  Medication Sig  . ACCU-CHEK SOFTCLIX LANCETS lancets Use to check blood sugar two times a day.  Dx:250.00 Non-Insulin  . albuterol (PROVENTIL HFA;VENTOLIN HFA) 108 (90 BASE) MCG/ACT inhaler 2 puffs every 4 hours as needed for wheezing.  Marland Kitchen aspirin 81 MG tablet Take 81 mg by mouth as needed.  . Blood Glucose Monitoring Suppl (ACCU-CHEK AVIVA) device Use to check blood sugars two times a day.  Dx:250.00 Non-Insulin  . Cholecalciferol (VITAMIN D3 PO) Take 1,000 Units by mouth daily.  . clobetasol ointment (TEMOVATE) 0.05 % Apply topically 2 (two) times daily. Use 2 to 3 times weekly  . colchicine (COLCRYS) 0.6 MG tablet Take 1 tablet (0.6 mg total) by mouth daily. (Patient taking differently: Take 0.6 mg by mouth as needed. )  . glucose blood (ACCU-CHEK AVIVA PLUS) test strip Use to check blood sugar two times a day.  Dx:250.00 Non-Insulin  . latanoprost (XALATAN) 0.005 % ophthalmic solution Place 1 drop into  both eyes at bedtime.  Marland Kitchen losartan (COZAAR) 50 MG tablet TAKE 1 TABLET EVERY DAY  . metFORMIN (GLUCOPHAGE-XR) 500 MG 24 hr tablet TAKE 1 TABLET TWICE DAILY  . methylPREDNISolone (MEDROL) 4 MG tablet Take as directed  . metoprolol (LOPRESSOR) 50 MG tablet TAKE 1 TABLET TWICE DAILY  . Multiple Vitamins-Minerals (CENTRUM ADULTS PO) Take 1 tablet by mouth daily.  . mupirocin ointment (BACTROBAN) 2 % Apply 1 application topically 2 (two) times daily.  Marland Kitchen neomycin-bacitracin-polymyxin (NEOSPORIN) ointment Apply 1 application topically as needed for wound care.   . neomycin-polymyxin-hydrocortisone (CORTISPORIN) otic solution Place 3 drops into the left ear 4 (four) times daily. (Patient taking differently: Place 3 drops into the left ear as needed. )  . NONFORMULARY OR COMPOUNDED ITEM See pharmacy note  . NONFORMULARY OR COMPOUNDED ITEM See pharmacy note  . RANITIDINE & DIET MANAGE PROD PO Take  2 tablets by mouth at bedtime.   . timolol (TIMOPTIC) 0.5 % ophthalmic solution Place 1 drop into both eyes daily.   . traMADol (ULTRAM) 50 MG tablet TAKE 1 TABLET FOUR TIMES DAILY  . triamcinolone cream (KENALOG) 0.1 % Apply 1 application topically 2 (two) times daily. (Patient taking differently: Apply 1 application topically as needed. )  . VOLTAREN 1 % GEL APPLY 2 GRAM TOPICALLY 4 TIMES A DAY AS NEEDED  . [DISCONTINUED] calcium carbonate (TUMS - DOSED IN MG ELEMENTAL CALCIUM) 500 MG chewable tablet Chew 1 tablet by mouth as needed.  . [DISCONTINUED] diclofenac sodium (VOLTAREN) 1 % GEL Apply 2 g topically 4 (four) times daily as needed.  . [DISCONTINUED] docusate sodium (COLACE) 100 MG capsule Take 100 mg by mouth 2 (two) times daily.  . [DISCONTINUED] ibuprofen (ADVIL,MOTRIN) 200 MG tablet Take 200 mg by mouth as needed.  . [DISCONTINUED] omeprazole (PRILOSEC) 20 MG capsule Take 1 capsule (20 mg total) by mouth daily.  . [DISCONTINUED] venlafaxine XR (EFFEXOR-XR) 75 MG 24 hr capsule TAKE 1 CAPSULE EVERY DAY WITH BREAKFAST   Facility-Administered Encounter Medications as of 02/04/2017  Medication  . betamethasone acetate-betamethasone sodium phosphate (CELESTONE) injection 3 mg  . betamethasone acetate-betamethasone sodium phosphate (CELESTONE) injection 3 mg  . betamethasone acetate-betamethasone sodium phosphate (CELESTONE) injection 3 mg    Activities of Daily Living In your present state of health, do you have any difficulty performing the following activities: 02/04/2017  Hearing? N  Vision? N  Difficulty concentrating or making decisions? N  Walking or climbing stairs? Y  Dressing or bathing? N  Doing errands, shopping? N  Preparing Food and eating ? N  Using the Toilet? N  In the past six months, have you accidently leaked urine? N  Do you have problems with loss of bowel control? N  Managing your Medications? N  Managing your Finances? N  Housekeeping or managing your  Housekeeping? N  Some recent data might be hidden    Patient Care Team: Jinny Sanders, MD as PCP - General    Assessment:     Hearing Screening   125Hz  250Hz  500Hz  1000Hz  2000Hz  3000Hz  4000Hz  6000Hz  8000Hz   Right ear:   40 40 40  40    Left ear:   40 40 40  40    Vision Screening Comments: May 2018 with Dr. Wallace Going    Exercise Activities and Dietary recommendations Current Exercise Habits: Home exercise routine, Type of exercise: stretching, Time (Minutes): 10, Frequency (Times/Week): 7, Weekly Exercise (Minutes/Week): 70, Intensity: Mild, Exercise limited by: None identified  Goals    .  Increase physical activity          Starting 02/04/2017, I will continue to do stretching exercises for at least 10 min daily.       Fall Risk Fall Risk  02/04/2017 12/10/2015 07/09/2014 06/29/2013  Falls in the past year? Yes No No No  Number falls in past yr: 1 - - -  Injury with Fall? No - - -   Depression Screen PHQ 2/9 Scores 02/04/2017 12/03/2014 07/09/2014 06/29/2013  PHQ - 2 Score 0 1 0 3  PHQ- 9 Score - - - 6     Cognitive Function MMSE - Mini Mental State Exam 02/04/2017  Orientation to time 5  Orientation to Place 5  Registration 3  Attention/ Calculation 0  Recall 3  Language- name 2 objects 0  Language- repeat 1  Language- follow 3 step command 3  Language- read & follow direction 0  Write a sentence 0  Copy design 0  Total score 20     PLEASE NOTE: A Mini-Cog screen was completed. Maximum score is 20. A value of 0 denotes this part of [place] MMSE was not completed or the patient failed this part of the Mini-Cog screening.   Mini-Cog Screening Orientation to Time - Max 5 pts Orientation to Place - Max 5 pts Registration - Max 3 pts Recall - Max 3 pts Language Repeat - Max 1 pts Language Follow 3 Step Command - Max 3 pts     Immunization History  Administered Date(s) Administered  . Influenza Split 06/13/2012  . Influenza Whole 08/20/2003, 05/30/2007,  05/30/2008, 06/06/2009, 06/17/2010  . Influenza,inj,Quad PF,36+ Mos 06/29/2013, 07/09/2014, 04/25/2015, 05/20/2016  . Pneumococcal Conjugate-13 07/09/2014  . Pneumococcal Polysaccharide-23 06/04/2011  . Td 03/09/2004   Screening Tests Health Maintenance  Topic Date Due  . TETANUS/TDAP  03/08/2024 (Originally 03/09/2014)  . INFLUENZA VACCINE  03/09/2017  . MAMMOGRAM  03/17/2017  . HEMOGLOBIN A1C  08/06/2017  . FOOT EXAM  12/03/2017  . OPHTHALMOLOGY EXAM  12/07/2017  . COLONOSCOPY  09/12/2023  . DEXA SCAN  Completed  . Hepatitis C Screening  Completed  . PNA vac Low Risk Adult  Completed      Plan:     I have personally reviewed and addressed the Medicare Annual Wellness questionnaire and have noted the following in the patient's chart:  A. Medical and social history B. Use of alcohol, tobacco or illicit drugs  C. Current medications and supplements D. Functional ability and status E.  Nutritional status F.  Physical activity G. Advance directives H. List of other physicians I.  Hospitalizations, surgeries, and ER visits in previous 12 months J.  [place] to include hearing, vision, cognitive, depression L. Referrals and appointments - none  In addition, I have reviewed and discussed with patient certain preventive protocols, quality metrics, and best practice recommendations. A written personalized care plan for preventive services as well as general preventive health recommendations were provided to patient.  See attached scanned questionnaire for additional information.   Signed,   Lindell Noe, MHA, BS, LPN Health Coach

## 2017-02-04 NOTE — Patient Instructions (Addendum)
Cathy Mullins , Thank you for taking time to come for your Medicare Wellness Visit. I appreciate your ongoing commitment to your health goals. Please review the following plan we discussed and let me know if I can assist you in the future.   These are the goals we discussed: Goals    . Increase physical activity          Starting 02/04/2017, I will continue to do stretching exercises for at least 10 min daily.        This is a list of the screening recommended for you and due dates:  Health Maintenance  Topic Date Due  . Tetanus Vaccine  03/08/2024*  . Flu Shot  03/09/2017  . Mammogram  03/17/2017  . Hemoglobin A1C  08/06/2017  . Complete foot exam   12/03/2017  . Eye exam for diabetics  12/07/2017  . Colon Cancer Screening  09/12/2023  . DEXA scan (bone density measurement)  Completed  .  Hepatitis C: One time screening is recommended by Center for Disease Control  (CDC) for  adults born from [birth year] through 1965.   Completed  . Pneumonia vaccines  Completed  *Topic was postponed. The date shown is not the original due date.     Preventive Care for Adults  A healthy lifestyle and preventive care can promote health and wellness. Preventive health guidelines for adults include the following key practices.  . A routine yearly physical is a good way to check with your health care provider about your health and preventive screening. It is a chance to share any concerns and updates on your health and to receive a thorough exam.  . Visit your dentist for a routine exam and preventive care every 6 months. Brush your teeth twice a day and floss once a day. Good oral hygiene prevents tooth decay and gum disease.  . The frequency of eye exams is based on your age, health, family medical history, use  of contact lenses, and other factors. Follow your health care provider's ecommendations for frequency of eye exams.  . Eat a healthy diet. Foods like vegetables, fruits, whole grains, low-fat  dairy products, and lean protein foods contain the nutrients you need without too many calories. Decrease your intake of foods high in solid fats, added sugars, and salt. Eat the right amount of calories for you. Get information about a proper diet from your health care provider, if necessary.  . Regular physical exercise is one of the most important things you can do for your health. Most adults should get at least 150 minutes of moderate-intensity exercise (any activity that increases your heart rate and causes you to sweat) each week. In addition, most adults need muscle-strengthening exercises on 2 or more days a week.  Silver Sneakers may be a benefit available to you. To determine eligibility, you may visit the website: www.silversneakers.com or contact program at 905-245-2649 Mon-Fri between 8AM-8PM.   . Maintain a healthy weight. The body mass index (BMI) is a screening tool to identify possible weight problems. It provides an estimate of body fat based on height and weight. Your health care provider can find your BMI and can help you achieve or maintain a healthy weight.   For adults 20 years and older: ? A BMI below 18.5 is considered underweight. ? A BMI of 18.5 to 24.9 is normal. ? A BMI of 25 to 29.9 is considered overweight. ? A BMI of 30 and above is considered obese.   Marland Kitchen  Maintain normal blood lipids and cholesterol levels by exercising and minimizing your intake of saturated fat. Eat a balanced diet with plenty of fruit and vegetables. Blood tests for lipids and cholesterol should begin at age 76 and be repeated every 5 years. If your lipid or cholesterol levels are high, you are over 50, or you are at high risk for heart disease, you may need your cholesterol levels checked more frequently. Ongoing high lipid and cholesterol levels should be treated with medicines if diet and exercise are not working.  . If you smoke, find out from your health care provider how to quit. If you do  not use tobacco, please do not start.  . If you choose to drink alcohol, please do not consume more than 2 drinks per day. One drink is considered to be 12 ounces (355 mL) of beer, 5 ounces (148 mL) of wine, or 1.5 ounces (44 mL) of liquor.  . If you are 73-46 years old, ask your health care provider if you should take aspirin to prevent strokes.  . Use sunscreen. Apply sunscreen liberally and repeatedly throughout the day. You should seek shade when your shadow is shorter than you. Protect yourself by wearing long sleeves, pants, a wide-brimmed hat, and sunglasses year round, whenever you are outdoors.  . Once a month, do a whole body skin exam, using a mirror to look at the skin on your back. Tell your health care provider of new moles, moles that have irregular borders, moles that are larger than a pencil eraser, or moles that have changed in shape or color.

## 2017-02-04 NOTE — Progress Notes (Signed)
PCP notes:   Health maintenance:   Eye exam - per pt, exam completed in May 2018 Foot exam - per pt, exam completedin April 2018 A1C - completed Tetanus - postponed/insurance  Abnormal screenings:   Fall risk - hx of fall without injury or medical treatment  Patient concerns:   None  Nurse concerns:  None  Next PCP appt:   02/11/17 @ 1400

## 2017-02-07 ENCOUNTER — Other Ambulatory Visit: Payer: Self-pay | Admitting: *Deleted

## 2017-02-07 MED ORDER — LOSARTAN POTASSIUM 50 MG PO TABS: 50.0000 mg | ORAL_TABLET | Freq: Every day | ORAL | 3 refills | Status: DC

## 2017-02-07 MED ORDER — METFORMIN HCL ER 500 MG PO TB24: 500.0000 mg | ORAL_TABLET | Freq: Two times a day (BID) | ORAL | 3 refills | Status: DC

## 2017-02-07 MED ORDER — METOPROLOL TARTRATE 50 MG PO TABS: 50.0000 mg | ORAL_TABLET | Freq: Two times a day (BID) | ORAL | 3 refills | Status: DC

## 2017-02-11 ENCOUNTER — Encounter: Payer: Self-pay | Admitting: Family Medicine

## 2017-02-11 ENCOUNTER — Ambulatory Visit (INDEPENDENT_AMBULATORY_CARE_PROVIDER_SITE_OTHER): Payer: Medicare HMO | Admitting: Family Medicine

## 2017-02-11 VITALS — BP 140/64 | HR 60 | Temp 98.2°F | Ht 61.5 in | Wt 179.2 lb

## 2017-02-11 DIAGNOSIS — I10 Essential (primary) hypertension: Secondary | ICD-10-CM

## 2017-02-11 DIAGNOSIS — E7800 Pure hypercholesterolemia, unspecified: Secondary | ICD-10-CM | POA: Diagnosis not present

## 2017-02-11 DIAGNOSIS — Z0001 Encounter for general adult medical examination with abnormal findings: Secondary | ICD-10-CM

## 2017-02-11 DIAGNOSIS — E559 Vitamin D deficiency, unspecified: Secondary | ICD-10-CM

## 2017-02-11 DIAGNOSIS — E1122 Type 2 diabetes mellitus with diabetic chronic kidney disease: Secondary | ICD-10-CM

## 2017-02-11 DIAGNOSIS — M797 Fibromyalgia: Secondary | ICD-10-CM | POA: Diagnosis not present

## 2017-02-11 DIAGNOSIS — N183 Chronic kidney disease, stage 3 (moderate): Secondary | ICD-10-CM

## 2017-02-11 DIAGNOSIS — Z0000 Encounter for general adult medical examination without abnormal findings: Secondary | ICD-10-CM

## 2017-02-11 NOTE — Assessment & Plan Note (Signed)
Stable control. Pain manageable.

## 2017-02-11 NOTE — Assessment & Plan Note (Addendum)
Well controlled. Continue current medication. On ARB. 

## 2017-02-11 NOTE — Patient Instructions (Addendum)
Work on [employer] and regular exercise. Get back on track with low carb diet. Decrease potatos.

## 2017-02-11 NOTE — Assessment & Plan Note (Signed)
Decreased kidney function from DM as well as slightly worsened due to mild dehydration. On  ARB.

## 2017-02-11 NOTE — Progress Notes (Signed)
I reviewed health advisor's note, was available for consultation, and agree with documentation and plan.  

## 2017-02-11 NOTE — Assessment & Plan Note (Signed)
Inadequate control. Pt wishes to try trial of chitosan to lower cholesterol. I will look into safety of this medication.

## 2017-02-11 NOTE — Assessment & Plan Note (Signed)
Slight worsening control. Work on low [employer].

## 2017-02-11 NOTE — Progress Notes (Signed)
Subjective:    Patient ID: Cathy Mullins, female    DOB: 1945-09-27, 71 y.o.   MRN: [id number]  HPI  The patient presents for  complete physical and review of chronic health problems.   The patient saw Candis Musa, LPN for medicare wellness. Note reviewed in detail and important notes copied below.  Health maintenance:  Eye exam - per pt, exam completed in May 2018 Foot exam - per pt, exam completedin April 2018 A1C - completed Tetanus - postponed/insurance Abnormal screenings:  Fall risk - hx of fall without injury or medical treatment  Hypertension:   Good control on losartan, metoprolol, propranolol prn. BP Readings from Last 3 Encounters:  02/11/17 140/64  02/04/17 130/70  06/25/16 (!) 145/74  Using medication without problems or lightheadedness:  none Chest pain with exertion: none Edema: none Achey of breath: none Average home BPs: good control Other issues:  Vit D: on suppelmetns  Vit B12: on supplemtns  Elevated Cholesterol:  Improved but inadequate control, goal < 70 given TIA. Refuses statin given severe SE in past Never tried flax seed oil, does not tolerated red yeast rice, SE to several statin meds...severe myalgia.  Trial of welchol ... She had trouble taking due to thickness and cause constipation. She refuses to try tablet form. Lab Results  Component Value Date   CHOL 228 (H) 02/04/2017   HDL 58.90 02/04/2017   LDLCALC 130 (H) 02/04/2017   LDLDIRECT 154.0 10/23/2015   TRIG 199.0 (H) 02/04/2017   CHOLHDL 4 02/04/2017  Using medications without problems: Muscle aches:  Diet compliance: She has note been eating as well as usual. Exercise: walks when able. Other complaints:  Diabetes:  Slight worsening control control  on metformin. Lab Results  Component Value Date   HGBA1C 6.9 (H) 02/04/2017  Using medications without difficulties: Hypoglycemic episodes: none Hyperglycemic episodes: none Feet problems: none Blood Sugars averaging: machine  is broken.with ill get new one eye exam within last year: yes 12/2016  She is interested in starting chitosan natural supplement for weight loss   Social History /Family History/Past Medical History reviewed in detail and updated in EMR if needed. Blood pressure 140/64, pulse 60, temperature 98.2 F (36.8 C), temperature source Oral, height 5' 1.5" (1.562 m), weight 179 lb 4 oz (81.3 kg).  Review of Systems  Constitutional: Negative for fatigue and fever.  HENT: Negative for congestion.   Eyes: Negative for pain.  Respiratory: Negative for cough and shortness of breath.   Cardiovascular: Negative for chest pain, palpitations and leg swelling.  Gastrointestinal: Negative for abdominal pain.  Genitourinary: Negative for dysuria and vaginal bleeding.  Musculoskeletal: Negative for back pain.  Neurological: Negative for syncope, light-headedness and headaches.  Psychiatric/Behavioral: Negative for dysphoric mood.       Objective:   Physical Exam  Constitutional: She is oriented to person, place, and time. Vital signs are normal. She appears well-developed and well-nourished. She is cooperative.  Non-toxic appearance. She does not appear ill. No distress.  HENT:  Head: Normocephalic.  Right Ear: Hearing, tympanic membrane, external ear and ear canal normal. Tympanic membrane is not erythematous, not retracted and not bulging.  Left Ear: Hearing, tympanic membrane, external ear and ear canal normal. Tympanic membrane is not erythematous, not retracted and not bulging.  Nose: Nose normal. No mucosal edema or rhinorrhea. Right sinus exhibits no maxillary sinus tenderness and no frontal sinus tenderness. Left sinus exhibits no maxillary sinus tenderness and no frontal sinus tenderness.  Mouth/Throat: Uvula is  midline, oropharynx is clear and moist and mucous membranes are normal.  Eyes: Conjunctivae, EOM and lids are normal. Pupils are equal, round, and reactive to light. Lids are everted and  swept, no foreign bodies found.  Neck: Trachea normal and normal range of motion. Neck supple. Carotid bruit is not present. No thyroid mass and no thyromegaly present.  Cardiovascular: Normal rate, regular rhythm, S1 normal, S2 normal, normal heart sounds, intact distal pulses and normal pulses.  Exam reveals no gallop and no friction rub.   No murmur heard. Pulmonary/Chest: Effort normal and breath sounds normal. No tachypnea. No respiratory distress. She has no decreased breath sounds. She has no wheezes. She has no rhonchi. She has no rales.  Abdominal: Soft. Normal appearance and bowel sounds are normal. She exhibits no distension, no fluid wave, no abdominal bruit and no mass. There is no hepatosplenomegaly. There is no tenderness. There is no rebound, no guarding and no CVA tenderness. No hernia.  Lymphadenopathy:    She has no cervical adenopathy.    She has no axillary adenopathy.  Neurological: She is alert and oriented to person, place, and time. She has normal strength and normal reflexes. No cranial nerve deficit or sensory deficit. She exhibits normal muscle tone. She displays a negative Romberg sign. Coordination and gait normal. GCS eye subscore is 4. GCS verbal subscore is 5. GCS motor subscore is 6.  Nml cerebellar exam   No papilledema  Skin: Skin is warm, dry and intact. No rash noted.  Psychiatric: She has a normal mood and affect. Her speech is normal and behavior is normal. Judgment and thought content normal. Her mood appears not anxious. Cognition and memory are normal. Cognition and memory are not impaired. She does not exhibit a depressed mood. She exhibits normal recent memory and normal remote memory.          Assessment & Plan:  The patient's preventative maintenance and recommended screening tests for an annual wellness exam were reviewed in full today. Brought up to date unless services declined.  Counselled on the importance of diet, exercise, and its role in  overall health and mortality. The patient's FH and SH was reviewed, including their home life, tobacco status, and drug and alcohol status.   DXA: 12/2015 nml, repeat in 5 years Mammo: nml 03/2016, mother and daughter with breast cancer PAP/DVE: not indicated s/p abdominal hysterectomy, right ovary remaining. Sees Dr. Kennon Rounds for GYN for lichen sclerosis but has not seen in a while. No family history of ovarian cancer. She has chosen to stop DVEs.  Vaccines:Up to date with PNA 13, 23 and planning shingles vaccine, tdap due.  Colon cancer screening: colonoscopy Nml,2015. Dr. Deatra Ina, repeat in 10 years.  Hep C:  done nonsmoker

## 2017-02-20 ENCOUNTER — Other Ambulatory Visit: Payer: Self-pay | Admitting: Family Medicine

## 2017-02-21 NOTE — Telephone Encounter (Signed)
Last office visit 02/11/2017.  Colcrys is not on current medication list.  Ok to refill?

## 2017-04-06 ENCOUNTER — Encounter: Payer: Self-pay | Admitting: Family Medicine

## 2017-04-08 NOTE — Telephone Encounter (Signed)
See pt note. Do you have any recommendations for hypnotists? I am not aware of any.

## 2017-04-11 ENCOUNTER — Other Ambulatory Visit: Payer: Self-pay | Admitting: Family Medicine

## 2017-04-11 NOTE — Telephone Encounter (Signed)
Last office visit 02/11/17.  Last refilled 12/07/16 for #80 with no refills.  Ok to refill?

## 2017-04-12 NOTE — Telephone Encounter (Signed)
Tramadol called into [place], [place] Phone: [phone]

## 2017-05-10 ENCOUNTER — Ambulatory Visit: Payer: Self-pay | Admitting: Family Medicine

## 2017-05-10 ENCOUNTER — Ambulatory Visit: Payer: Medicare HMO | Admitting: Family Medicine

## 2017-05-13 ENCOUNTER — Ambulatory Visit (INDEPENDENT_AMBULATORY_CARE_PROVIDER_SITE_OTHER): Payer: Medicare HMO | Admitting: Family Medicine

## 2017-05-13 ENCOUNTER — Encounter: Payer: Self-pay | Admitting: Family Medicine

## 2017-05-13 VITALS — BP 130/60 | HR 57 | Temp 98.2°F | Ht 61.5 in | Wt 180.8 lb

## 2017-05-13 DIAGNOSIS — G4489 Other headache syndrome: Secondary | ICD-10-CM | POA: Diagnosis not present

## 2017-05-13 DIAGNOSIS — Z23 Encounter for immunization: Secondary | ICD-10-CM | POA: Diagnosis not present

## 2017-05-13 DIAGNOSIS — R5383 Other fatigue: Secondary | ICD-10-CM

## 2017-05-13 DIAGNOSIS — E1122 Type 2 diabetes mellitus with diabetic chronic kidney disease: Secondary | ICD-10-CM

## 2017-05-13 DIAGNOSIS — R200 Anesthesia of skin: Secondary | ICD-10-CM

## 2017-05-13 DIAGNOSIS — N183 Chronic kidney disease, stage 3 (moderate): Secondary | ICD-10-CM

## 2017-05-13 DIAGNOSIS — Z8673 Personal history of transient ischemic attack (TIA), and cerebral infarction without residual deficits: Secondary | ICD-10-CM | POA: Diagnosis not present

## 2017-05-13 DIAGNOSIS — R202 Paresthesia of skin: Secondary | ICD-10-CM

## 2017-05-13 DIAGNOSIS — F5104 Psychophysiologic insomnia: Secondary | ICD-10-CM | POA: Diagnosis not present

## 2017-05-13 NOTE — Addendum Note (Signed)
Addended by: Carter Kitten on: 05/13/2017 03:58 PM   Modules accepted: Orders

## 2017-05-13 NOTE — Patient Instructions (Addendum)
Please stop at the front desk to set up referral.  Can try melatonin for sleep if you have not already.  Continue asa 325 mg daily.

## 2017-05-13 NOTE — Progress Notes (Signed)
Subjective:    Patient ID: Cathy Mullins, female    DOB: 1946/03/31, 71 y.o.   MRN: [id number]  HPI   71 year old female presents with multiple issues.  1. Recurrent mild clusters of headaches but associated with bright flashing lights.  ear pain at same time on left. Also left eye pain  Ongoing for  1 years  Saw eye MD. nml eye exam. Felt visual migraines.  Sudden episode of numbness on left side of face. Occurred 5 days. LAsted 10-15 seconds.  1 hour earlier time had sensation of ticking sensation down both side of neck. She has ? Of hx CVA/TIA. 2016 was having visual disturbances  intermittent left temporal area pain, off balance and severe dizziness with neck extension.   MRI was negative for age. No CVA seen.  On ASA 325 mg daily.  3. Worsening fatigue x years.  B12 tsh  Vit d nml in 01/2017, nml cbc 2017  No snoring, no apenic. Cannot sleep at night,  Cannot fall asleep to 5 AM, then sleeps to 10 AM. No anxiety. Good control of depression on venlafaxine 75 mg daily.  She is not interested in med for sleep.  Social History /Family History/Past Medical History reviewed in detail and updated in EMR if needed. Blood pressure 130/60, pulse (!) 57, temperature 98.2 F (36.8 C), temperature source Oral, height 5' 1.5" (1.562 m), weight 180 lb 12 oz (82 kg).  Review of Systems  Constitutional: Positive for fatigue. Negative for fever.  HENT: Negative for congestion.   Eyes: Negative for pain.  Respiratory: Negative for cough and shortness of breath.   Cardiovascular: Negative for chest pain, palpitations and leg swelling.  Gastrointestinal: Negative for abdominal pain.  Genitourinary: Negative for dysuria and vaginal bleeding.  Musculoskeletal: Negative for back pain.  Neurological: Positive for numbness and headaches. Negative for syncope, weakness and light-headedness.  Psychiatric/Behavioral: Negative for dysphoric mood.       Objective:   Physical Exam  Constitutional:  She is oriented to person, place, and time. Vital signs are normal. She appears well-developed and well-nourished. She is cooperative.  Non-toxic appearance. She does not appear ill. No distress.  HENT:  Head: Normocephalic.  Right Ear: Hearing, tympanic membrane, external ear and ear canal normal. Tympanic membrane is not erythematous, not retracted and not bulging.  Left Ear: Hearing, tympanic membrane, external ear and ear canal normal. Tympanic membrane is not erythematous, not retracted and not bulging.  Nose: No mucosal edema or rhinorrhea. Right sinus exhibits no maxillary sinus tenderness and no frontal sinus tenderness. Left sinus exhibits no maxillary sinus tenderness and no frontal sinus tenderness.  Mouth/Throat: Uvula is midline, oropharynx is clear and moist and mucous membranes are normal.  Eyes: Pupils are equal, round, and reactive to light. Conjunctivae, EOM and lids are normal. Lids are everted and swept, no foreign bodies found.  Neck: Trachea normal and normal range of motion. Neck supple. Carotid bruit is not present. No thyroid mass and no thyromegaly present.  Cardiovascular: Normal rate, regular rhythm, S1 normal, S2 normal, normal heart sounds, intact distal pulses and normal pulses.  Exam reveals no gallop and no friction rub.   No murmur heard. Pulmonary/Chest: Effort normal and breath sounds normal. No tachypnea. No respiratory distress. She has no decreased breath sounds. She has no wheezes. She has no rhonchi. She has no rales.  Abdominal: Soft. Normal appearance and bowel sounds are normal. There is no tenderness.  Neurological: She is alert and  oriented to person, place, and time. She has normal strength and normal reflexes. No cranial nerve deficit or sensory deficit. She exhibits normal muscle tone. She displays a negative Romberg sign. Coordination and gait normal. GCS eye subscore is 4. GCS verbal subscore is 5. GCS motor subscore is 6.  Nml cerebellar exam   No  papilledema  Skin: Skin is warm, dry and intact. No rash noted.  Psychiatric: She has a normal mood and affect. Her speech is normal and behavior is normal. Judgment and thought content normal. Her mood appears not anxious. Cognition and memory are normal. Cognition and memory are not impaired. She does not exhibit a depressed mood. She exhibits normal recent memory and normal remote memory.          Assessment & Plan:  Multiple atypical neurologic changes with unusual head pain, eye pain and ear pain.  neuro exam is normal.  Possible migraine variant.  Recent numbness concerning for TIA.Marland Kitchen Last year possible TIA, but MRI negative.  Continue aspirin 325 mg daily.  Refer to  Neuro for further eval.  Fatigue: work up with recent labs nml.  no clear sign of sleep apnea.  Poor sleep at night.. possibly related to mood. Pt refuses medication to treat. Given healthy sleep hygiene info.

## 2017-06-20 ENCOUNTER — Ambulatory Visit: Payer: Self-pay | Admitting: *Deleted

## 2017-06-20 DIAGNOSIS — H401131 Primary open-angle glaucoma, bilateral, mild stage: Secondary | ICD-10-CM | POA: Diagnosis not present

## 2017-06-20 NOTE — Telephone Encounter (Signed)
C/o pain that comes and goes in left side and left flank area.  It's very tender to the touch under my ribs.   Denies any falls or injury to this area.  Has had a life long problem with constipation but denies this feeling like that.  It began yesterday.   She has had vomiting on and off for a year that she sees her MD for.   When asked about her fluid intake I'm sipping on water and tea all the time.   "I feel fine as far as my diabetes"     She wants to wait see how she feels and keep her appt that she has tomorrow.   I instructed her to call back if the pain got worse or did not go away.   I instructed her that if she did not chose to call us back to go to the ED to be evaluated.   She verbalized understanding.   "I'm going to see how I feel after trying to put on my pants and decide from there"   Denied any urinary symptoms.   Not vomited since last Wednesday.  Does not have her appendix.    Reason for Disposition . [1] MODERATE pain (e.g., interferes with normal activities) AND [2] comes and goes (cramps) AND [3] present > 24 hours  (Exception: pain with Vomiting or Diarrhea - see that Guideline)  Answer Assessment - Initial Assessment Questions 1. LOCATION: "Where does it hurt?"      Left side of stomach and around to side and under my ribs in the back 2. RADIATION: "Does the pain shoot anywhere else?" (e.g., chest, back)     In my left lower back around to the side and the front 3. ONSET: "When did the pain begin?" (e.g., minutes, hours or days ago)      Began Friday morning when I got up. 4. SUDDEN: "Gradual or sudden onset?"     Mild to begin with on and off and was becoming worse. 5. PATTERN "Does the pain come and go, or is it constant?"    - If constant: "Is it getting better, staying the same, or worsening?"      (Note: Constant means the pain never goes away completely; most serious pain is constant and it progresses)     - If intermittent: "How long does it last?" "Do you have pain  now?"     (Note: Intermittent means the pain goes away completely between bouts)     Pain in going and going and mostly in my back 6. SEVERITY: "How bad is the pain?"  (e.g., Scale 1-10; mild, moderate, or severe)    - MILD (1-3): doesn't interfere with normal activities, abdomen soft and not tender to touch     - MODERATE (4-7): interferes with normal activities or awakens from sleep, tender to touch     - SEVERE (8-10): excruciating pain, doubled over, unable to do any normal activities       Throbbing pain a 2, very mild,    Back pain is tender to touch if I touch it. 7. RECURRENT SYMPTOM: "Have you ever had this type of abdominal pain before?" If so, ask: "When was the last time?" and "What happened that time?"      No.   This is new.   8. AGGRAVATING FACTORS: "Does anything seem to cause this pain?" (e.g., foods, stress, alcohol)     After I eat and after I take my medication  it is sharper pain.  Leaning to left seems to help. 9. CARDIAC SYMPTOMS: "Do you have any of the following symptoms: chest pain, difficulty breathing, sweating, nausea?"     No. 10. OTHER SYMPTOMS: "Do you have any other symptoms?" (e.g., fever, vomiting, diarrhea)       Last time I vomited was Wednesday.   Thursday had several BMs.  I have a problem with constipation. 11. PREGNANCY: "Is there any chance you are pregnant?" "When was your last menstrual period?"       Not asked  Protocols used: ABDOMINAL PAIN - UPPER-A-AH

## 2017-06-21 ENCOUNTER — Ambulatory Visit: Payer: Medicare HMO | Admitting: Family Medicine

## 2017-06-21 ENCOUNTER — Other Ambulatory Visit: Payer: Self-pay

## 2017-06-21 ENCOUNTER — Encounter: Payer: Self-pay | Admitting: Family Medicine

## 2017-06-21 VITALS — BP 140/64 | HR 57 | Temp 97.7°F | Ht 61.5 in | Wt 182.0 lb

## 2017-06-21 DIAGNOSIS — R109 Unspecified abdominal pain: Secondary | ICD-10-CM | POA: Diagnosis not present

## 2017-06-21 DIAGNOSIS — R10A1 Flank pain, right side: Secondary | ICD-10-CM | POA: Insufficient documentation

## 2017-06-21 LAB — POC URINALSYSI DIPSTICK (AUTOMATED)
Bilirubin, UA: NEGATIVE
GLUCOSE UA: NEGATIVE
Ketones, UA: NEGATIVE
Leukocytes, UA: NEGATIVE
NITRITE UA: NEGATIVE
PROTEIN UA: NEGATIVE
RBC UA: NEGATIVE
SPEC GRAV UA: 1.015 (ref 1.010–1.025)
UROBILINOGEN UA: 0.2 U/dL
pH, UA: 6 (ref 5.0–8.0)

## 2017-06-21 NOTE — Patient Instructions (Signed)
UA was clear.  Please stop at the lab to have labs drawn. Please stop at the front desk to set up referral.

## 2017-06-21 NOTE — Addendum Note (Signed)
Addended by: Carter Kitten on: 06/21/2017 04:29 PM   Modules accepted: Orders

## 2017-06-21 NOTE — Progress Notes (Signed)
Subjective:    Patient ID: Cathy Mullins, female    DOB: 1946/05/25, 71 y.o.   MRN: [id number]  HPI  71 year old female  With history of DM, CKD, HTN, fibromyalgia, GERD presents with new onset RUQ pain and right flank pain.   She reports  in last few days  gradual onset of pain in RUQ, intermittent mild.  Progressed to worsened more constant pain in right flank.  Sore to touch. No rash seen.  No dysuria, no blood in urine.  Intermittent diarrhea and constipation.. But this is usual for her. Nml BM last night.  no fever.  No nausea and vomiting.. But last week had an episode of emesis x 1  Episode.  no new fall or strain of muscle noted.  Has not tried anything for the pain except heating pad.   Has gallbladder. Pain is a little worse with eating.  Blood pressure 140/64, pulse (!) 57, temperature 97.7 F (36.5 C), temperature source Oral, height 5' 1.5" (1.562 m), weight 182 lb (82.6 kg).   Review of Systems  Constitutional: Negative for fatigue and fever.  HENT: Negative for congestion and ear pain.   Eyes: Negative for pain.  Respiratory: Negative for cough, chest tightness and shortness of breath.   Cardiovascular: Negative for chest pain, palpitations and leg swelling.  Gastrointestinal: Positive for abdominal pain and vomiting.  Genitourinary: Negative for dysuria and vaginal bleeding.  Musculoskeletal: Negative for back pain.  Neurological: Negative for syncope, light-headedness and headaches.  Psychiatric/Behavioral: Negative for dysphoric mood.       Objective:   Physical Exam  Constitutional: Vital signs are normal. She appears well-developed and well-nourished. She is cooperative.  Non-toxic appearance. She does not appear ill. No distress.  HENT:  Head: Normocephalic.  Right Ear: Hearing, tympanic membrane, external ear and ear canal normal. Tympanic membrane is not erythematous, not retracted and not bulging.  Left Ear: Hearing, tympanic membrane, external  ear and ear canal normal. Tympanic membrane is not erythematous, not retracted and not bulging.  Nose: No mucosal edema or rhinorrhea. Right sinus exhibits no maxillary sinus tenderness and no frontal sinus tenderness. Left sinus exhibits no maxillary sinus tenderness and no frontal sinus tenderness.  Mouth/Throat: Uvula is midline, oropharynx is clear and moist and mucous membranes are normal.  Eyes: Conjunctivae, EOM and lids are normal. Pupils are equal, round, and reactive to light. Lids are everted and swept, no foreign bodies found.  Neck: Trachea normal and normal range of motion. Neck supple. Carotid bruit is not present. No thyroid mass and no thyromegaly present.  Cardiovascular: Normal rate, regular rhythm, S1 normal, S2 normal, normal heart sounds, intact distal pulses and normal pulses. Exam reveals no gallop and no friction rub.  No murmur heard. Pulmonary/Chest: Effort normal and breath sounds normal. No tachypnea. No respiratory distress. She has no decreased breath sounds. She has no wheezes. She has no rhonchi. She has no rales.  Abdominal: Soft. Normal appearance and bowel sounds are normal. There is tenderness in the right upper quadrant and right lower quadrant. There is guarding, tenderness at McBurney's point and positive Murphy's sign. There is no rebound and no CVA tenderness.  ttp in right flank and CVA  Neurological: She is alert.  Skin: Skin is warm, dry and intact. No rash noted.  Psychiatric: Her speech is normal and behavior is normal. Judgment and thought content normal. Her mood appears not anxious. Cognition and memory are normal. She does not exhibit a depressed  mood.          Assessment & Plan:

## 2017-06-21 NOTE — Assessment & Plan Note (Signed)
Possible gallbladder, pancreas or liver  Referred pain.Marland Kitchen eval with labs and [place].  UA clear.. No clear kidney stone of infection.  Less likely if eval negative.. May be thoracic muscle strain.

## 2017-06-22 LAB — CBC WITH DIFFERENTIAL/PLATELET
BASOS PCT: 0.9 % (ref 0.0–3.0)
Basophils Absolute: 0.1 10*3/uL (ref 0.0–0.1)
EOS ABS: 0.4 10*3/uL (ref 0.0–0.7)
EOS PCT: 4 % (ref 0.0–5.0)
HCT: 37.9 % (ref 36.0–46.0)
Hemoglobin: 12.4 g/dL (ref 12.0–15.0)
LYMPHS ABS: 4.2 10*3/uL — AB (ref 0.7–4.0)
Lymphocytes Relative: 38.8 % (ref 12.0–46.0)
MCHC: 32.7 g/dL (ref 30.0–36.0)
MCV: 86.7 fl (ref 78.0–100.0)
MONO ABS: 0.8 10*3/uL (ref 0.1–1.0)
Monocytes Relative: 7.8 % (ref 3.0–12.0)
NEUTROS PCT: 48.5 % (ref 43.0–77.0)
Neutro Abs: 5.2 10*3/uL (ref 1.4–7.7)
PLATELETS: 385 10*3/uL (ref 150.0–400.0)
RBC: 4.37 Mil/uL (ref 3.87–5.11)
RDW: 14.7 % (ref 11.5–15.5)
WBC: 10.8 10*3/uL — ABNORMAL HIGH (ref 4.0–10.5)

## 2017-06-22 LAB — COMPREHENSIVE METABOLIC PANEL
ALT: 15 U/L (ref 0–35)
AST: 20 U/L (ref 0–37)
Albumin: 4.2 g/dL (ref 3.5–5.2)
Alkaline Phosphatase: 49 U/L (ref 39–117)
BUN: 15 mg/dL (ref 6–23)
CHLORIDE: 99 meq/L (ref 96–112)
CO2: 30 meq/L (ref 19–32)
CREATININE: 1.11 mg/dL (ref 0.40–1.20)
Calcium: 10.2 mg/dL (ref 8.4–10.5)
GFR: 51.47 mL/min — ABNORMAL LOW (ref >60.00)
GLUCOSE: 99 mg/dL (ref 70–99)
POTASSIUM: 4.9 meq/L (ref 3.5–5.1)
SODIUM: 137 meq/L (ref 135–145)
Total Bilirubin: 0.3 mg/dL (ref 0.2–1.2)
Total Protein: 7.6 g/dL (ref 6.0–8.3)

## 2017-06-22 LAB — LIPASE: LIPASE: 50 U/L (ref 11.0–59.0)

## 2017-06-23 ENCOUNTER — Ambulatory Visit
Admission: RE | Admit: 2017-06-23 | Discharge: 2017-06-23 | Disposition: A | Discharge status: Home / Self Care | Payer: Medicare HMO | Source: Ambulatory Visit | Attending: Family Medicine | Admitting: Family Medicine

## 2017-06-23 DIAGNOSIS — R109 Unspecified abdominal pain: Secondary | ICD-10-CM | POA: Insufficient documentation

## 2017-06-23 DIAGNOSIS — R1011 Right upper quadrant pain: Secondary | ICD-10-CM | POA: Diagnosis not present

## 2017-06-23 DIAGNOSIS — K7689 Other specified diseases of liver: Secondary | ICD-10-CM | POA: Diagnosis not present

## 2017-06-27 DIAGNOSIS — H401131 Primary open-angle glaucoma, bilateral, mild stage: Secondary | ICD-10-CM | POA: Diagnosis not present

## 2017-06-27 LAB — HM DIABETES EYE EXAM

## 2017-06-28 ENCOUNTER — Encounter: Payer: Self-pay | Admitting: Family Medicine

## 2017-06-29 ENCOUNTER — Other Ambulatory Visit: Payer: Self-pay | Admitting: Family Medicine

## 2017-06-29 ENCOUNTER — Telehealth: Payer: Self-pay | Admitting: Family Medicine

## 2017-06-29 ENCOUNTER — Other Ambulatory Visit: Payer: Self-pay

## 2017-06-29 NOTE — Telephone Encounter (Signed)
Electronic refill Last refill 04/17/16 #90/1 Last office visit 06/21/17

## 2017-06-29 NOTE — Telephone Encounter (Signed)
Reviewed refill request and chart indicates there is a refill pending from provider.

## 2017-06-29 NOTE — Telephone Encounter (Signed)
I am aware of the interaction, ok to refill

## 2017-06-29 NOTE — Telephone Encounter (Signed)
See phone note Okay to refill per Webb Silversmith NP  See interaction with Tramadol

## 2017-06-29 NOTE — Telephone Encounter (Signed)
Copied from [place]. Topic: Quick Communication - See Telephone Encounter >> Jun 29, 2017 12:17 PM Conception Chancy, NT wrote: CRM for notification. See Telephone encounter for:  06/29/17.  Pt needs venlafaxine refill sent to [facility]

## 2017-06-29 NOTE — Telephone Encounter (Signed)
See refill request Last refill 04/17/16 #90/1 Last office visit 06/21/17 Is it okay to refill?

## 2017-06-29 NOTE — Telephone Encounter (Signed)
Ok to refill Venlafaxine

## 2017-07-14 ENCOUNTER — Telehealth: Payer: Self-pay

## 2017-07-14 NOTE — Telephone Encounter (Signed)
Copied from [place] [id number]. Topic: Referral - Request >> Jul 14, 2017 10:51 AM Arletha Grippe wrote: Reason for CRM: Pt found little lump in her breast, would like to have order for mammogram. Offered appt to see pt, she declined and is asking for order instead. Cb number is [phone]

## 2017-07-14 NOTE — Telephone Encounter (Signed)
Spoke with Mrs. Myers and advised she will need to be seen before a diagnostic mammogram can be ordered.  Appointment scheduled for 07/19/2017 at 3:45 with Dr. Diona Browner.

## 2017-07-19 ENCOUNTER — Ambulatory Visit: Payer: Medicare HMO | Admitting: Family Medicine

## 2017-07-26 ENCOUNTER — Ambulatory Visit: Payer: Medicare HMO | Admitting: Neurology

## 2017-07-26 ENCOUNTER — Encounter: Payer: Self-pay | Admitting: Neurology

## 2017-07-26 DIAGNOSIS — G43109 Migraine with aura, not intractable, without status migrainosus: Secondary | ICD-10-CM | POA: Insufficient documentation

## 2017-07-26 NOTE — Patient Instructions (Signed)
Migraine Headache A migraine headache is an intense, throbbing pain on one side or both sides of the head. Migraines may also cause other symptoms, such as nausea, vomiting, and sensitivity to light and noise. What are the causes? Doing or taking certain things may also trigger migraines, such as:  Alcohol.  Smoking.  Medicines, such as: ? Medicine used to treat chest pain (nitroglycerine). ? Birth control pills. ? Estrogen pills. ? Certain blood pressure medicines.  Aged cheeses, chocolate, or caffeine.  Foods or drinks that contain nitrates, glutamate, aspartame, or tyramine.  Physical activity.  Other things that may trigger a migraine include:  Menstruation.  Pregnancy.  Hunger.  Stress, lack of sleep, too much sleep, or fatigue.  Weather changes.  What increases the risk? The following factors may make you more likely to experience migraine headaches:  Age. Risk increases with age.  Family history of migraine headaches.  Being Caucasian.  Depression and anxiety.  Obesity.  Being a woman.  Having a hole in the heart (patent foramen ovale) or other heart problems.  What are the signs or symptoms? The main symptom of this condition is pulsating or throbbing pain. Pain may:  Happen in any area of the head, such as on one side or both sides.  Interfere with daily activities.  Get worse with physical activity.  Get worse with exposure to bright lights or loud noises.  Other symptoms may include:  Nausea.  Vomiting.  Dizziness.  General sensitivity to bright lights, loud noises, or smells.  Before you get a migraine, you may get warning signs that a migraine is developing (aura). An aura may include:  Seeing flashing lights or having blind spots.  Seeing bright spots, halos, or zigzag lines.  Having tunnel vision or blurred vision.  Having numbness or a tingling feeling.  Having trouble talking.  Having muscle weakness.  How is this  diagnosed? A migraine headache can be diagnosed based on:  Your symptoms.  A physical exam.  Tests, such as CT scan or MRI of the head. These imaging tests can help rule out other causes of headaches.  Taking fluid from the spine (lumbar puncture) and analyzing it (cerebrospinal fluid analysis, or CSF analysis).  How is this treated? A migraine headache is usually treated with medicines that:  Relieve pain.  Relieve nausea.  Prevent migraines from coming back.  Treatment may also include:  Acupuncture.  Lifestyle changes like avoiding foods that trigger migraines.  Follow these instructions at home: Medicines  Take over-the-counter and prescription medicines only as told by your health care provider.  Do not drive or use heavy machinery while taking prescription pain medicine.  To prevent or treat constipation while you are taking prescription pain medicine, your health care provider may recommend that you: ? Drink enough fluid to keep your urine clear or pale yellow. ? Take over-the-counter or prescription medicines. ? Eat foods that are high in fiber, such as fresh fruits and vegetables, whole grains, and beans. ? Limit foods that are high in fat and processed sugars, such as fried and sweet foods. Lifestyle  Avoid alcohol use.  Do not use any products that contain nicotine or tobacco, such as cigarettes and e-cigarettes. If you need help quitting, ask your health care provider.  Get at least 8 hours of sleep every night.  Limit your stress. General instructions   Keep a journal to find out what may trigger your migraine headaches. For example, write down: ? What you eat and   drink. ? How much sleep you get. ? Any change to your diet or medicines.  If you have a migraine: ? Avoid things that make your symptoms worse, such as bright lights. ? It may help to lie down in a dark, quiet room. ? Do not drive or use heavy machinery. ? Ask your health care provider  what activities are safe for you while you are experiencing symptoms.  Keep all follow-up visits as told by your health care provider. This is important. Contact a health care provider if:  You develop symptoms that are different or more severe than your usual migraine symptoms. Get help right away if:  Your migraine becomes severe.  You have a fever.  You have a stiff neck.  You have vision loss.  Your muscles feel weak or like you cannot control them.  You start to lose your balance often.  You develop trouble walking.  You faint. This information is not intended to replace advice given to you by your health care provider. Make sure you discuss any questions you have with your health care provider. Document Released: 07/26/2005 Document Revised: 02/13/2016 Document Reviewed: 01/12/2016 Elsevier Interactive Patient Education  2017 Elsevier Inc.   

## 2017-07-26 NOTE — Progress Notes (Signed)
[facility]    Provider:  Dr Jaynee Eagles Referring Provider: Jinny Sanders, MD Primary Care Physician:  Jinny Sanders, MD  CC: Migraine with aura  HPI:  Cathy Mullins is a 71 y.o. female here as a referral from Dr. Diona Browner for left-sided facial numbness but patient denies she is here for that and denies it happened but says she is here for visual disturbances and that she has migraines with auras. She has a past medical history of chronic symptoms for at least 2 years with left ear fullness, visual disturbances, intermittent left temporal area pain, off balance and severe dizziness with neck extension.  MRI of the brain in October 2016 was performed due to all the symptoms and was largely unremarkable.  Also past medical history of insomnia, headaches and visual disturbances. She has chronic neck pain she says from multiple whiplashes.   She says she has horrible pain in the left ear. During the same period she gets lights in her visual fields and a headache on the left side of the head. And she vomits. She has had migraines most of her adult life, used to be associated with her menses and had one last for months. The visual change is only in the left eye, ongoing for over 2 years, the visual disturbances are with headaches. The last migraine with visual changes was a month ago. She doesn't try anything, she just lays down and she feels better. MRi in 2016 due to the same symptoms was negative. No changes in quality or frequency or severity for the last several years. She also gets nausea and vomiting. She takes Tramadol for Fibromyalgia but doesn;t feel these episodes affect her enough to treat, she is not concerned she is here because her doctors told her to come here. She declines any medication or treatment. She is chronically fatigued. No snoring or symptoms of sleep apnea. She takes ASA 325mg .   Reviewed notes, labs and imaging from outside physicians, which showed:  Reviewed  primary care notes, patient has a long history of headaches, associated with bright flashing lights, ear pain at the same time, also left eye pain, has been going on for several years at least, she saw an ophthalmologist with a normal eye exam and was diagnosed with visual migraines, at recent appointment she complained about a sudden episode of numbness in the left side of the face lasted for 10-15 seconds, she has a questionable history of CVA TIA, in 2016 she was having visual disturbances intermittent left temporal area pain she was having severe dizziness with neck extension.  She was placed on aspirin 325 mg daily.  She also has worsening fatigue for years and her B12, vitamin D, TSH have been checked.  No apneas, no snoring, she does have chronic insomnia.  She has good control of depression on venlafaxine 75 mg daily.   Personally reviewed MRI of the brain and agree with the following  71 year old female with left ear fullness, visual disturbances for 2 months, intermittent left temporal area pain, off balance and severe dizziness with neck extension. Initial encounter.  EXAM: MRI HEAD WITHOUT AND WITH CONTRAST (personally reviewed images and agree with following)  TECHNIQUE: Multiplanar, multiecho pulse sequences of the brain and surrounding structures were obtained without and with intravenous contrast.  CONTRAST:  70mL MULTIHANCE GADOBENATE DIMEGLUMINE 529 MG/ML IV SOLN  COMPARISON:  Cervical spine radiographs 01/08/2014  FINDINGS: Cerebral volume is within normal limits for age. No restricted diffusion to suggest  acute infarction. No midline shift, mass effect, evidence of mass lesion, ventriculomegaly, extra-axial collection or acute intracranial hemorrhage. Cervicomedullary junction and pituitary are within normal limits. Major intracranial vascular flow voids are within normal limits.  Mild for age, scattered cerebral white matter T2 and FLAIR hyperintense foci.  Configuration is nonspecific, but most commonly these reflect chronic small vessel disease. No cortical encephalomalacia or chronic cerebral blood products. Deep gray matter nuclei, brainstem, and cerebellum are normal. No abnormal enhancement identified. No dural thickening.  Visualized internal auditory structures appear within normal limits, high riding left internal jugular bulb (normal anatomic variant). The left transverse and sigmoid sinuses are dominant. Mastoids are clear.  Paranasal sinuses are clear. Postoperative changes to the globes. Negative orbit and scalp soft tissues. Negative visualized cervical spine. Visualized bone marrow signal is within normal limits.  IMPRESSION: 1.  No acute intracranial abnormality. 2. Largely unremarkable for age MRI appearance of the brain. Mild nonspecific white matter signal changes  Review of Systems: Patient complains of symptoms per HPI as well as the following symptoms: Fatigue, blurred vision, double vision, eye pain, feeling hot, flushing, constipation, joint pain, joint swelling, aching muscles, allergies, itching, memory loss, headache, dizziness, sleepiness, restless legs, depression, anxiety, too much sleep, decreased energy, change in appetite, disinterest in activities, hallucinations. Pertinent negatives and positives per HPI. All others negative.   Social History   Socioeconomic History  . Marital status: Married    Spouse name: Not on file  . Number of children: 1  . Years of education: Not on file  . Highest education level: Some college, no degree  Social Needs  . Financial resource strain: Not on file  . Food insecurity - worry: Not on file  . Food insecurity - inability: Not on file  . Transportation needs - medical: Not on file  . Transportation needs - non-medical: Not on file  Occupational History  . Occupation: retired Marketing executive)    Fish farm manager: retired  Tobacco Use  . Smoking status: Former Smoker     Last attempt to quit: 09/27/1988    Years since quitting: 28.8  . Smokeless tobacco: Never Used  Substance and Sexual Activity  . Alcohol use: No    Alcohol/week: 0.0 oz  . Drug use: No  . Sexual activity: Not on file  Other Topics Concern  . Not on file  Social History Narrative   Regular exercise--no   Diet: fruits and veggies, calcium, water, avoids fast food   Right handed   Caffeine use: 1 quart to 1/2 gallon of tea daily                   Family History  Problem Relation Age of Onset  . Cancer Mother        breast  . Stroke Mother   . Valvular heart disease Mother   . Breast cancer Mother   . Alzheimer's disease Mother   . Kidney disease Father   . Stroke Father   . Coronary artery disease Father   . Parkinson's disease Father   . Diabetes Brother   . Alzheimer's disease Maternal Grandmother   . Diabetes Maternal Aunt   . Alzheimer's disease Maternal Aunt   . Alzheimer's disease Maternal Aunt   . Colon cancer Neg Hx   . Pancreatic cancer Neg Hx   . Stomach cancer Neg Hx     Past Medical History:  Diagnosis Date  . Allergy   . Asthma with COPD ([place])   .  Depression    daughter was very sick, depression began during this period of time   . Diabetes mellitus   . Esophagitis   . Fibromyalgia   . GERD (gastroesophageal reflux disease)   . Glaucoma   . History of colon polyps   . HTN (hypertension)   . Hyperlipidemia   . Lichen sclerosus   . Migraine   . Perforating folliculitis   . Spinal stenosis   . Tachycardia   . TIA (transient ischemic attack)     Past Surgical History:  Procedure Laterality Date  . ABDOMINAL HYSTERECTOMY     right ovary remaining  . APPENDECTOMY    . BREAST CYST ASPIRATION Bilateral   . BREAST EXCISIONAL BIOPSY Right 1982   NEG  . BREAST SURGERY     right(benign)  . CARPAL TUNNEL RELEASE    . CESAREAN SECTION    . EPIDURAL BLOCK INJECTION  2013  . KNEE ARTHROSCOPY W/ MENISCAL REPAIR     left  . LIPOMA EXCISION      2 times left leg  . lumpectomy    . TONSILLECTOMY    . TRIGGER FINGER RELEASE      Current Outpatient Medications  Medication Sig Dispense Refill  . ACCU-CHEK SOFTCLIX LANCETS lancets Use to check blood sugar two times a day.  Dx:250.00 Non-Insulin 200 each 3  . albuterol (PROVENTIL HFA;VENTOLIN HFA) 108 (90 BASE) MCG/ACT inhaler 2 puffs every 4 hours as needed for wheezing. 1 Inhaler 0  . aspirin 81 MG tablet Take 81 mg by mouth as needed.    . Blood Glucose Monitoring Suppl (ACCU-CHEK AVIVA) device Use to check blood sugars two times a day.  Dx:250.00 Non-Insulin 1 each 0  . Cholecalciferol (VITAMIN D3 PO) Take 1,000 Units by mouth daily.    . clobetasol ointment (TEMOVATE) 0.05 % Apply topically 2 (two) times daily. Use 2 to 3 times weekly 30 g 0  . COLCRYS 0.6 MG tablet TAKE ONE TABLET BY MOUTH ONCE DAILY 30 tablet 5  . glucose blood (ACCU-CHEK AVIVA PLUS) test strip Use to check blood sugar two times a day.  Dx:250.00 Non-Insulin 200 each 3  . latanoprost (XALATAN) 0.005 % ophthalmic solution Place 1 drop into both eyes at bedtime.    Marland Kitchen losartan (COZAAR) 50 MG tablet Take 1 tablet (50 mg total) by mouth daily. 90 tablet 3  . metFORMIN (GLUCOPHAGE-XR) 500 MG 24 hr tablet Take 1 tablet (500 mg total) by mouth 2 (two) times daily. 180 tablet 3  . metoprolol tartrate (LOPRESSOR) 50 MG tablet Take 1 tablet (50 mg total) by mouth 2 (two) times daily. 180 tablet 3  . Multiple Vitamins-Minerals (CENTRUM ADULTS PO) Take 1 tablet by mouth daily.    . mupirocin ointment (BACTROBAN) 2 % Apply 1 application topically 2 (two) times daily. 22 g 0  . neomycin-bacitracin-polymyxin (NEOSPORIN) ointment Apply 1 application topically as needed for wound care.     . neomycin-polymyxin-hydrocortisone (CORTISPORIN) otic solution Place 3 drops into the left ear 4 (four) times daily. (Patient taking differently: Place 3 drops into the left ear as needed. ) 10 mL 0  . RANITIDINE & DIET MANAGE PROD PO Take 2  tablets by mouth at bedtime.     . timolol (TIMOPTIC) 0.5 % ophthalmic solution Place 1 drop into both eyes daily.     . traMADol (ULTRAM) 50 MG tablet TAKE 1 TABLET FOUR TIMES DAILY 120 tablet 3  . triamcinolone cream (KENALOG) 0.1 % Apply 1 application  topically 2 (two) times daily. (Patient taking differently: Apply 1 application topically as needed. ) 30 g 0  . venlafaxine XR (EFFEXOR-XR) 75 MG 24 hr capsule TAKE 1 CAPSULE EVERY DAY WITH BREAKFAST 90 capsule 1  . VOLTAREN 1 % GEL APPLY 2 GRAM TOPICALLY 4 TIMES A DAY AS NEEDED 300 g 11   Current Facility-Administered Medications  Medication Dose Route Frequency Provider Last Rate Last Dose  . betamethasone acetate-betamethasone sodium phosphate (CELESTONE) injection 3 mg  3 mg Intramuscular Once Daylene Katayama M, DPM      . betamethasone acetate-betamethasone sodium phosphate (CELESTONE) injection 3 mg  3 mg Intramuscular Once Daylene Katayama M, DPM      . betamethasone acetate-betamethasone sodium phosphate (CELESTONE) injection 3 mg  3 mg Intramuscular Once Edrick Kins, DPM        Allergies as of 07/26/2017 - Review Complete 07/26/2017  Allergen Reaction Noted  . Amoxicillin-pot clavulanate    . Codeine    . Hydrocodone-acetaminophen    . Lovastatin    . Sulfonamide derivatives    . Tizanidine  05/05/2012    Vitals: BP 137/84 (BP Location: Right Arm, Patient Position: Sitting)   Pulse 69   Ht 5\' 1"  (1.549 m)   Wt 177 lb 3.2 oz (80.4 kg)   BMI 33.48 kg/m  Last Weight:  Wt Readings from Last 1 Encounters:  07/26/17 177 lb 3.2 oz (80.4 kg)   Last Height:   Ht Readings from Last 1 Encounters:  07/26/17 5\' 1"  (1.549 m)    Physical exam: Exam: Gen: NAD, very conversant and tangential, well nourised, obese, well groomed                     CV: RRR, no MRG. No Carotid Bruits. No peripheral edema, warm, nontender Eyes: Conjunctivae clear without exudates or hemorrhage  Neuro: Detailed Neurologic Exam  Speech:    Speech is  normal; fluent and spontaneous with normal comprehension.  Cognition:    The patient is oriented to person, place, and time;     recent and remote memory intact;     language fluent;     normal attention, concentration,     fund of knowledge Cranial Nerves:    The pupils are equal, round, and reactive to light. Attempted fundoscopic exam could not visualize. Visual fields are full to finger confrontation. Extraocular movements are intact. Trigeminal sensation is intact and the muscles of mastication are normal. The face is symmetric. The palate elevates in the midline. Hearing intact. Voice is normal. Shoulder shrug is normal. The tongue has normal motion without fasciculations.   Coordination:    Normal finger to nose and heel to shin. Normal rapid alternating movements.   Gait:    Antalgic, walks with a cane  Motor Observation:    No asymmetry, no atrophy, and no involuntary movements noted. Tone:    Normal muscle tone.    Posture:    Posture is normal. normal erect    Strength:    Strength is V/V in the upper and lower limbs.      Sensation: intact to LT     Reflex Exam:  DTR's:    Deep tendon reflexes in the upper and lower extremities are normal bilaterally.   Toes:    The toes are downgoing bilaterally.   Clonus:    Clonus is absent.      Assessment/Plan:  71 year old with migraines with aura, no changes in frequency, severity, quality  for many years, MRI 2 years ago for same symptoms was negative. She declines treatment.   Discussed: To prevent or relieve headaches, try the following: Cool Compress. Lie down and place a cool compress on your head.  Avoid headache triggers. If certain foods or odors seem to have triggered your migraines in the past, avoid them. A headache diary might help you identify triggers.  Include physical activity in your daily routine. Try a daily walk or other moderate aerobic exercise.  Manage stress. Find healthy ways to cope with the  stressors, such as delegating tasks on your to-do list.  Practice relaxation techniques. Try deep breathing, yoga, massage and visualization.  Eat regularly. Eating regularly scheduled meals and maintaining a healthy diet might help prevent headaches. Also, drink plenty of fluids.  Follow a regular sleep schedule. Sleep deprivation might contribute to headaches Consider biofeedback. With this mind-body technique, you learn to control certain bodily functions - such as muscle tension, heart rate and blood pressure - to prevent headaches or reduce headache pain.    Proceed to emergency room if you experience new or worsening symptoms or symptoms do not resolve, if you have new neurologic symptoms or if headache is severe, or for any concerning symptom.   Provided education and documentation from American headache Society toolbox including articles on: chronic migraine medication overuse headache, chronic migraines, prevention of migraines, behavioral and other nonpharmacologic treatments for headache.    Sarina Ill, MD  [facility] [address] [place], [place] [address]  Phone [phone] Fax [phone]

## 2017-08-12 ENCOUNTER — Encounter: Payer: Self-pay | Admitting: Family Medicine

## 2017-08-12 ENCOUNTER — Other Ambulatory Visit: Payer: Self-pay

## 2017-08-12 ENCOUNTER — Ambulatory Visit (INDEPENDENT_AMBULATORY_CARE_PROVIDER_SITE_OTHER): Payer: Medicare HMO | Admitting: Family Medicine

## 2017-08-12 VITALS — BP 130/64 | HR 63 | Temp 97.8°F | Ht 61.0 in | Wt 181.8 lb

## 2017-08-12 DIAGNOSIS — N644 Mastodynia: Secondary | ICD-10-CM

## 2017-08-12 DIAGNOSIS — N63 Unspecified lump in unspecified breast: Secondary | ICD-10-CM | POA: Diagnosis not present

## 2017-08-12 NOTE — Patient Instructions (Addendum)
Stop at front desk to set up mammogram.

## 2017-08-12 NOTE — Progress Notes (Signed)
   Subjective:    Patient ID: Cathy Mullins, female    DOB: [birth year], 72 y.o.   MRN: [id number]  HPI    72 year old female presents with new onset breast lumpiness, right breast sore  Last mammogram 03/17/2016   Right breast soreness noted in last month.  She has noted increased lumpiness in both breasts in last few months  No new medications. No hormones.   Has noted left ear soreness, now better but achy.  Saw neurologist for migraines.  Pt not interested in medication.  She is working on lifestyle changes and control.  PMH hx of breast cyst aspiration, right lumpectomy benign in past Family HX mother with breast cancer at nipple  daughter with breast disease.    Review of Systems  Constitutional: Negative for fatigue and fever.  HENT: Negative for ear pain.   Eyes: Negative for pain.  Respiratory: Negative for chest tightness and shortness of breath.   Cardiovascular: Negative for chest pain, palpitations and leg swelling.  Gastrointestinal: Negative for abdominal pain.  Genitourinary: Negative for dysuria.       Objective:   Physical Exam  Constitutional: Vital signs are normal. She appears well-developed and well-nourished. She is cooperative.  Non-toxic appearance. She does not appear ill. No distress.  HENT:  Head: Normocephalic.  Right Ear: Hearing, tympanic membrane, external ear and ear canal normal. Tympanic membrane is not erythematous, not retracted and not bulging.  Left Ear: Hearing, tympanic membrane, external ear and ear canal normal. Tympanic membrane is not erythematous, not retracted and not bulging.  Nose: No mucosal edema or rhinorrhea. Right sinus exhibits no maxillary sinus tenderness and no frontal sinus tenderness. Left sinus exhibits no maxillary sinus tenderness and no frontal sinus tenderness.  Mouth/Throat: Uvula is midline, oropharynx is clear and moist and mucous membranes are normal.  Eyes: Conjunctivae, EOM and lids are normal. Pupils are  equal, round, and reactive to light. Lids are everted and swept, no foreign bodies found.  Neck: Trachea normal and normal range of motion. Neck supple. Carotid bruit is not present. No thyroid mass and no thyromegaly present.  Cardiovascular: Normal rate, regular rhythm, S1 normal, S2 normal, normal heart sounds, intact distal pulses and normal pulses. Exam reveals no gallop and no friction rub.  No murmur heard. Pulmonary/Chest: Effort normal and breath sounds normal. No tachypnea. No respiratory distress. She has no decreased breath sounds. She has no wheezes. She has no rhonchi. She has no rales.  Abdominal: Soft. Normal appearance and bowel sounds are normal. There is no tenderness.  Genitourinary: There is breast tenderness.  Genitourinary Comments:  Soreness in right lateral breast and bilateral axillae, no focal mass palpated, but increased bilateral fibroglandular tissue  Neurological: She is alert.  Skin: Skin is warm, dry and intact. No rash noted.  Psychiatric: Her speech is normal and behavior is normal. Judgment and thought content normal. Her mood appears not anxious. Cognition and memory are normal. She does not exhibit a depressed mood.          Assessment & Plan:

## 2017-08-12 NOTE — Addendum Note (Signed)
Addended by: Eliezer Lofts E on: 08/12/2017 04:41 PM   Modules accepted: Orders

## 2017-08-16 NOTE — Addendum Note (Signed)
Addended by: Carter Kitten on: 08/16/2017 12:04 PM   Modules accepted: Orders

## 2017-08-23 ENCOUNTER — Ambulatory Visit
Admission: RE | Admit: 2017-08-23 | Discharge: 2017-08-23 | Disposition: A | Discharge status: Home / Self Care | Payer: Medicare HMO | Source: Ambulatory Visit | Attending: Family Medicine | Admitting: Family Medicine

## 2017-08-23 DIAGNOSIS — N63 Unspecified lump in unspecified breast: Secondary | ICD-10-CM

## 2017-08-23 DIAGNOSIS — N644 Mastodynia: Secondary | ICD-10-CM | POA: Diagnosis not present

## 2017-08-23 DIAGNOSIS — R928 Other abnormal and inconclusive findings on diagnostic imaging of breast: Secondary | ICD-10-CM | POA: Diagnosis not present

## 2017-09-08 ENCOUNTER — Other Ambulatory Visit: Payer: Self-pay | Admitting: Family Medicine

## 2017-09-08 NOTE — Telephone Encounter (Signed)
Requesting refill tramadol to [facility]; last refilled # 120 x 3 on 04/12/17. Last annual 02/11/17 and last acute 08/12/17.Please advise.

## 2017-09-08 NOTE — Telephone Encounter (Deleted)
Copied from [place]. Topic: Quick Communication - Rx Refill/Question >> Sep 08, 2017 11:40 AM Scherrie Gerlach wrote: Medication: traMADol (ULTRAM) 50 MG tablet  Pt states [facility] needs a new rx for approval [facility] - [place], [place] [phone] (Phone) [phone] (Fax)

## 2017-09-08 NOTE — Telephone Encounter (Signed)
Copied from [place]. Topic: Quick Communication - Rx Refill/Question >> Sep 08, 2017 11:40 AM Scherrie Gerlach wrote: Medication: traMADol (ULTRAM) 50 MG tablet  Pt states [facility] needs a new rx for approval [facility] - [place], [place] [phone] (Phone) [phone] (Fax)

## 2017-09-09 MED ORDER — TRAMADOL HCL 50 MG PO TABS: ORAL_TABLET | ORAL | 3 refills | Status: DC

## 2017-09-09 NOTE — Telephone Encounter (Signed)
Pt notified via V/M per DPR that Tramadol refill sent electronically to [facility].

## 2017-12-09 ENCOUNTER — Other Ambulatory Visit: Payer: Self-pay | Admitting: Internal Medicine

## 2017-12-09 NOTE — Telephone Encounter (Signed)
Last filled 06/29/17 by Garnette Gunner, last OV with you was for an acute 08/2017... Please advise

## 2017-12-14 ENCOUNTER — Other Ambulatory Visit: Payer: Self-pay | Admitting: Family Medicine

## 2017-12-26 DIAGNOSIS — H401131 Primary open-angle glaucoma, bilateral, mild stage: Secondary | ICD-10-CM | POA: Diagnosis not present

## 2018-01-17 ENCOUNTER — Other Ambulatory Visit: Payer: Self-pay | Admitting: Family Medicine

## 2018-01-17 MED ORDER — TRAMADOL HCL 50 MG PO TABS: ORAL_TABLET | ORAL | 3 refills | Status: DC

## 2018-01-17 NOTE — Telephone Encounter (Signed)
Copied from [place] [phone]. Topic: Quick Communication - Rx Refill/Question >> Jan 17, 2018 12:34 PM Margot Ables wrote: Medication: traMADol (ULTRAM) 50 MG tablet = 1 week on hand - takes 2/day Has the patient contacted their pharmacy? No - not refillable Preferred Pharmacy (with phone number or street name): [place], [place] [phone] (Phone) [phone] (Fax)

## 2018-01-17 NOTE — Telephone Encounter (Signed)
LOV  08/12/17 Dr.Bedsole Last refill 09/09/17  # 120  With 3 refills

## 2018-01-31 DIAGNOSIS — I10 Essential (primary) hypertension: Secondary | ICD-10-CM | POA: Insufficient documentation

## 2018-01-31 DIAGNOSIS — R001 Bradycardia, unspecified: Secondary | ICD-10-CM | POA: Diagnosis not present

## 2018-01-31 DIAGNOSIS — R61 Generalized hyperhidrosis: Secondary | ICD-10-CM | POA: Diagnosis not present

## 2018-01-31 DIAGNOSIS — M549 Dorsalgia, unspecified: Secondary | ICD-10-CM | POA: Diagnosis not present

## 2018-01-31 DIAGNOSIS — G8929 Other chronic pain: Secondary | ICD-10-CM | POA: Diagnosis not present

## 2018-01-31 DIAGNOSIS — Z7984 Long term (current) use of oral hypoglycemic drugs: Secondary | ICD-10-CM | POA: Diagnosis not present

## 2018-01-31 DIAGNOSIS — R079 Chest pain, unspecified: Secondary | ICD-10-CM | POA: Diagnosis not present

## 2018-01-31 DIAGNOSIS — R072 Precordial pain: Secondary | ICD-10-CM | POA: Diagnosis not present

## 2018-01-31 DIAGNOSIS — Z87891 Personal history of nicotine dependence: Secondary | ICD-10-CM | POA: Diagnosis not present

## 2018-01-31 DIAGNOSIS — M797 Fibromyalgia: Secondary | ICD-10-CM | POA: Diagnosis not present

## 2018-01-31 DIAGNOSIS — R55 Syncope and collapse: Secondary | ICD-10-CM | POA: Diagnosis not present

## 2018-01-31 DIAGNOSIS — R0789 Other chest pain: Secondary | ICD-10-CM | POA: Diagnosis not present

## 2018-01-31 DIAGNOSIS — I341 Nonrheumatic mitral (valve) prolapse: Secondary | ICD-10-CM | POA: Diagnosis not present

## 2018-01-31 DIAGNOSIS — E119 Type 2 diabetes mellitus without complications: Secondary | ICD-10-CM | POA: Diagnosis not present

## 2018-01-31 DIAGNOSIS — R9431 Abnormal electrocardiogram [ECG] [EKG]: Secondary | ICD-10-CM | POA: Diagnosis not present

## 2018-02-01 DIAGNOSIS — R55 Syncope and collapse: Secondary | ICD-10-CM | POA: Diagnosis not present

## 2018-02-01 DIAGNOSIS — M797 Fibromyalgia: Secondary | ICD-10-CM | POA: Diagnosis not present

## 2018-02-01 MED ORDER — HYDRALAZINE HCL 20 MG/ML IJ SOLN
10.00 | INTRAMUSCULAR | Status: DC
Start: ? — End: 2018-02-01

## 2018-02-01 MED ORDER — TIMOLOL MALEATE 0.5 % OP SOLN
1.00 | OPHTHALMIC | Status: DC
Start: 2018-02-01 — End: 2018-02-01

## 2018-02-01 MED ORDER — ACETAMINOPHEN 325 MG PO TABS
650.00 | ORAL_TABLET | ORAL | Status: DC
Start: ? — End: 2018-02-01

## 2018-02-01 MED ORDER — DIPHENHYDRAMINE HCL 25 MG PO CAPS
25.00 | ORAL_CAPSULE | ORAL | Status: DC
Start: ? — End: 2018-02-01

## 2018-02-01 MED ORDER — ALUMINUM-MAGNESIUM-SIMETHICONE 200-200-20 MG/5ML PO SUSP
30.00 | ORAL | Status: DC
Start: ? — End: 2018-02-01

## 2018-02-01 MED ORDER — BISACODYL 5 MG PO TBEC
10.00 | DELAYED_RELEASE_TABLET | ORAL | Status: DC
Start: ? — End: 2018-02-01

## 2018-02-01 MED ORDER — SORBITOL 70 % RE SOLN
30.00 | RECTAL | Status: DC
Start: ? — End: 2018-02-01

## 2018-02-01 MED ORDER — GLUCOSE 40 % PO GEL
15.00 g | ORAL | Status: DC
Start: ? — End: 2018-02-01

## 2018-02-01 MED ORDER — ONDANSETRON HCL 4 MG/2ML IJ SOLN
4.00 | INTRAMUSCULAR | Status: DC
Start: ? — End: 2018-02-01

## 2018-02-01 MED ORDER — ASPIRIN EC 81 MG PO TBEC
81.00 | DELAYED_RELEASE_TABLET | ORAL | Status: DC
Start: 2018-02-02 — End: 2018-02-01

## 2018-02-01 MED ORDER — COLCHICINE 0.6 MG PO TABS
0.60 | ORAL_TABLET | ORAL | Status: DC
Start: 2018-02-02 — End: 2018-02-01

## 2018-02-01 MED ORDER — ALBUTEROL SULFATE HFA 108 (90 BASE) MCG/ACT IN AERS
2.00 | INHALATION_SPRAY | RESPIRATORY_TRACT | Status: DC
Start: ? — End: 2018-02-01

## 2018-02-01 MED ORDER — DICLOFENAC SODIUM 1 % TD GEL
2.00 g | TRANSDERMAL | Status: DC
Start: 2018-02-01 — End: 2018-02-01

## 2018-02-01 MED ORDER — LOSARTAN POTASSIUM 50 MG PO TABS
50.00 | ORAL_TABLET | ORAL | Status: DC
Start: 2018-02-02 — End: 2018-02-01

## 2018-02-01 MED ORDER — LATANOPROST 0.005 % OP SOLN
1.00 | OPHTHALMIC | Status: DC
Start: 2018-02-01 — End: 2018-02-01

## 2018-02-01 MED ORDER — INSULIN LISPRO 100 UNIT/ML ~~LOC~~ SOLN
1.00 | SUBCUTANEOUS | Status: DC
Start: 2018-02-01 — End: 2018-02-01

## 2018-02-01 MED ORDER — CHOLECALCIFEROL 25 MCG (1000 UT) PO TABS
1000.00 | ORAL_TABLET | ORAL | Status: DC
Start: 2018-02-02 — End: 2018-02-01

## 2018-02-01 MED ORDER — GENERIC EXTERNAL MEDICATION
1.00 | Status: DC
Start: ? — End: 2018-02-01

## 2018-02-01 MED ORDER — SODIUM CHLORIDE 0.9 % IV SOLN
INTRAVENOUS | Status: DC
Start: ? — End: 2018-02-01

## 2018-02-01 MED ORDER — CLONIDINE HCL 0.1 MG PO TABS
0.10 | ORAL_TABLET | ORAL | Status: DC
Start: ? — End: 2018-02-01

## 2018-02-01 MED ORDER — ONDANSETRON 4 MG PO TBDP
4.00 | ORAL_TABLET | ORAL | Status: DC
Start: ? — End: 2018-02-01

## 2018-02-01 MED ORDER — TRAMADOL HCL 50 MG PO TABS
50.00 | ORAL_TABLET | ORAL | Status: DC
Start: 2018-02-01 — End: 2018-02-01

## 2018-02-01 MED ORDER — ZOLPIDEM TARTRATE 5 MG PO TABS
5.00 | ORAL_TABLET | ORAL | Status: DC
Start: ? — End: 2018-02-01

## 2018-02-01 MED ORDER — METOPROLOL TARTRATE 50 MG PO TABS
50.00 | ORAL_TABLET | ORAL | Status: DC
Start: 2018-02-02 — End: 2018-02-01

## 2018-02-01 MED ORDER — DEXTROSE 50 % IV SOLN
12.00 g | INTRAVENOUS | Status: DC
Start: ? — End: 2018-02-01

## 2018-02-01 MED ORDER — FAMOTIDINE 20 MG PO TABS
20.00 | ORAL_TABLET | ORAL | Status: DC
Start: 2018-02-01 — End: 2018-02-01

## 2018-02-14 ENCOUNTER — Ambulatory Visit (INDEPENDENT_AMBULATORY_CARE_PROVIDER_SITE_OTHER): Payer: Medicare HMO

## 2018-02-14 ENCOUNTER — Encounter: Payer: Self-pay | Admitting: *Deleted

## 2018-02-14 ENCOUNTER — Telehealth: Payer: Self-pay | Admitting: Family Medicine

## 2018-02-14 VITALS — BP 122/60 | HR 55 | Temp 98.1°F | Ht 63.0 in | Wt 177.5 lb

## 2018-02-14 DIAGNOSIS — N183 Chronic kidney disease, stage 3 unspecified: Secondary | ICD-10-CM

## 2018-02-14 DIAGNOSIS — E1122 Type 2 diabetes mellitus with diabetic chronic kidney disease: Secondary | ICD-10-CM | POA: Diagnosis not present

## 2018-02-14 DIAGNOSIS — Z0000 Encounter for general adult medical examination without abnormal findings: Secondary | ICD-10-CM

## 2018-02-14 LAB — COMPREHENSIVE METABOLIC PANEL
ALBUMIN: 4.1 g/dL (ref 3.5–5.2)
ALK PHOS: 58 U/L (ref 39–117)
ALT: 15 U/L (ref 0–35)
AST: 17 U/L (ref 0–37)
BILIRUBIN TOTAL: 0.4 mg/dL (ref 0.2–1.2)
BUN: 14 mg/dL (ref 6–23)
CO2: 31 mEq/L (ref 19–32)
Calcium: 9.6 mg/dL (ref 8.4–10.5)
Chloride: 101 mEq/L (ref 96–112)
Creatinine, Ser: 1.18 mg/dL (ref 0.40–1.20)
GFR: 47.88 mL/min — AB (ref >60.00)
GLUCOSE: 135 mg/dL — AB (ref 70–99)
Potassium: 5.2 mEq/L — ABNORMAL HIGH (ref 3.5–5.1)
Sodium: 137 mEq/L (ref 135–145)
TOTAL PROTEIN: 7.3 g/dL (ref 6.0–8.3)

## 2018-02-14 LAB — LDL CHOLESTEROL, DIRECT: LDL DIRECT: 154 mg/dL

## 2018-02-14 LAB — HEMOGLOBIN A1C: HEMOGLOBIN A1C: 6.9 % — AB (ref 4.6–6.5)

## 2018-02-14 LAB — LIPID PANEL
Cholesterol: 241 mg/dL — ABNORMAL HIGH (ref 0–200)
HDL: 56.7 mg/dL (ref >39.00)
NonHDL: 183.81
Total CHOL/HDL Ratio: 4
Triglycerides: 247 mg/dL — ABNORMAL HIGH (ref 0.0–149.0)
VLDL: 49.4 mg/dL — ABNORMAL HIGH (ref 0.0–40.0)

## 2018-02-14 NOTE — Telephone Encounter (Signed)
-----   Message from Eustace Pen, LPN sent at 08/12/9700  3:55 PM EDT ----- Regarding: Labs 7/9 Lab orders needed. Thank you.  Insurance:  [employer]

## 2018-02-14 NOTE — Progress Notes (Signed)
I reviewed health advisor's note, was available for consultation, and agree with documentation and plan.  

## 2018-02-14 NOTE — Progress Notes (Signed)
PCP notes:   Health maintenance:  Foot exam - PCP please address at next appt A1C - completed  Abnormal screenings:   None  Patient concerns:   None  Nurse concerns:  None  Next PCP appt:   02/16/18 @ 1115

## 2018-02-14 NOTE — Patient Instructions (Signed)
Ms. Krizek , Thank you for taking time to come for your Medicare Wellness Visit. I appreciate your ongoing commitment to your health goals. Please review the following plan we discussed and let me know if I can assist you in the future.   These are the goals we discussed: Goals    . Patient Stated     Starting 02/14/2018, I will continue to take medications as prescribed.        This is a list of the screening recommended for you and due dates:  Health Maintenance  Topic Date Due  . Complete foot exam   02/16/2018*  . Tetanus Vaccine  03/08/2024*  . Flu Shot  03/09/2018  . Eye exam for diabetics  06/27/2018  . Hemoglobin A1C  08/17/2018  . Mammogram  08/23/2018  . Colon Cancer Screening  09/12/2023  . DEXA scan (bone density measurement)  Completed  .  Hepatitis C: One time screening is recommended by Center for Disease Control  (CDC) for  adults born from [birth year] through 1965.   Completed  . Pneumonia vaccines  Completed  *Topic was postponed. The date shown is not the original due date.   Preventive Care for Adults  A healthy lifestyle and preventive care can promote health and wellness. Preventive health guidelines for adults include the following key practices.  . A routine yearly physical is a good way to check with your health care provider about your health and preventive screening. It is a chance to share any concerns and updates on your health and to receive a thorough exam.  . Visit your dentist for a routine exam and preventive care every 6 months. Brush your teeth twice a day and floss once a day. Good oral hygiene prevents tooth decay and gum disease.  . The frequency of eye exams is based on your age, health, family medical history, use  of contact lenses, and other factors. Follow your health care provider's recommendations for frequency of eye exams.  . Eat a healthy diet. Foods like vegetables, fruits, whole grains, low-fat dairy products, and lean protein foods  contain the nutrients you need without too many calories. Decrease your intake of foods high in solid fats, added sugars, and salt. Eat the right amount of calories for you. Get information about a proper diet from your health care provider, if necessary.  . Regular physical exercise is one of the most important things you can do for your health. Most adults should get at least 150 minutes of moderate-intensity exercise (any activity that increases your heart rate and causes you to sweat) each week. In addition, most adults need muscle-strengthening exercises on 2 or more days a week.  Silver Sneakers may be a benefit available to you. To determine eligibility, you may visit the website: www.silversneakers.com or contact program at [phone] Mon-Fri between 8AM-8PM.   . Maintain a healthy weight. The body mass index (BMI) is a screening tool to identify possible weight problems. It provides an estimate of body fat based on height and weight. Your health care provider can find your BMI and can help you achieve or maintain a healthy weight.   For adults 20 years and older: ? A BMI below 18.5 is considered underweight. ? A BMI of 18.5 to 24.9 is normal. ? A BMI of 25 to 29.9 is considered overweight. ? A BMI of 30 and above is considered obese.   . Maintain normal blood lipids and cholesterol levels by exercising and minimizing your intake  of saturated fat. Eat a balanced diet with plenty of fruit and vegetables. Blood tests for lipids and cholesterol should begin at age 62 and be repeated every 5 years. If your lipid or cholesterol levels are high, you are over 50, or you are at high risk for heart disease, you may need your cholesterol levels checked more frequently. Ongoing high lipid and cholesterol levels should be treated with medicines if diet and exercise are not working.  . If you smoke, find out from your health care provider how to quit. If you do not use tobacco, please do not  start.  . If you choose to drink alcohol, please do not consume more than 2 drinks per day. One drink is considered to be 12 ounces (355 mL) of beer, 5 ounces (148 mL) of wine, or 1.5 ounces (44 mL) of liquor.  . If you are 52-35 years old, ask your health care provider if you should take aspirin to prevent strokes.  . Use sunscreen. Apply sunscreen liberally and repeatedly throughout the day. You should seek shade when your shadow is shorter than you. Protect yourself by wearing long sleeves, pants, a wide-brimmed hat, and sunglasses year round, whenever you are outdoors.  . Once a month, do a whole body skin exam, using a mirror to look at the skin on your back. Tell your health care provider of new moles, moles that have irregular borders, moles that are larger than a pencil eraser, or moles that have changed in shape or color.

## 2018-02-14 NOTE — Progress Notes (Signed)
Subjective:   Cathy Mullins is a 72 y.o. female who presents for Medicare Annual (Subsequent) preventive examination.  Review of Systems:  N/A Cardiac Risk Factors include: advanced age (>30men, >43 women);diabetes mellitus;hypertension;obesity (BMI >30kg/m2)     Objective:     Vitals: BP 122/60 (BP Location: Right Arm, Patient Position: Sitting, Cuff Size: Normal)   Pulse (!) 55   Temp 98.1 F (36.7 C)   Ht 5\' 3"  (1.6 m) Comment: shoes  Wt 177 lb 8 oz (80.5 kg)   SpO2 98%   BMI 31.44 kg/m   Body mass index is 31.44 kg/m.  Advanced Directives 02/14/2018 02/04/2017 06/04/2016  Does Patient Have a Medical Advance Directive? Yes Yes Yes  Type of Paramedic of [place];Living will [place];Living will [place] in Chart? No - copy requested No - copy requested -    Tobacco Social History   Tobacco Use  Smoking Status Former Smoker  . Last attempt to quit: 09/27/1988  . Years since quitting: 29.4  Smokeless Tobacco Never Used     Counseling given: No   Clinical Intake:  Pre-visit preparation completed: Yes  Pain Score: 6      Nutritional Status: BMI > 30  Obese Nutritional Risks: None Diabetes: No  How often do you need to have someone help you when you read instructions, pamphlets, or other written materials from your doctor or pharmacy?: 1 - Never What is the last grade level you completed in school?: 12th grade + some college courses  Interpreter Needed?: No  Comments: pt lives with spouse Information entered by :: LPinson, LPN  Past Medical History:  Diagnosis Date  . Allergy   . Asthma with COPD ([place])   . Depression    daughter was very sick, depression began during this period of time   . Diabetes mellitus   . Esophagitis   . Fibromyalgia   . GERD (gastroesophageal reflux disease)   . Glaucoma   . History of colon polyps   . HTN (hypertension)     . Hyperlipidemia   . Lichen sclerosus   . Migraine   . Perforating folliculitis   . Spinal stenosis   . Tachycardia   . TIA (transient ischemic attack)    Past Surgical History:  Procedure Laterality Date  . ABDOMINAL HYSTERECTOMY     right ovary remaining  . APPENDECTOMY    . BREAST CYST ASPIRATION Bilateral   . BREAST EXCISIONAL BIOPSY Right 1982   NEG  . BREAST SURGERY     right(benign)  . CARPAL TUNNEL RELEASE    . CESAREAN SECTION    . EPIDURAL BLOCK INJECTION  2013  . KNEE ARTHROSCOPY W/ MENISCAL REPAIR     left  . LIPOMA EXCISION     2 times left leg  . lumpectomy    . TONSILLECTOMY    . TRIGGER FINGER RELEASE     Family History  Problem Relation Age of Onset  . Cancer Mother        breast  . Stroke Mother   . Valvular heart disease Mother   . Breast cancer Mother 23  . Alzheimer's disease Mother   . Kidney disease Father   . Stroke Father   . Coronary artery disease Father   . Parkinson's disease Father   . Diabetes Brother   . Alzheimer's disease Maternal Grandmother   . Diabetes Maternal Aunt   .  Alzheimer's disease Maternal Aunt   . Alzheimer's disease Maternal Aunt   . Colon cancer Neg Hx   . Pancreatic cancer Neg Hx   . Stomach cancer Neg Hx    Social History   Socioeconomic History  . Marital status: Married    Spouse name: Not on file  . Number of children: 1  . Years of education: Not on file  . Highest education level: Some college, no degree  Occupational History  . Occupation: retired (Personal assistant)    Fish farm manager: retired  Scientific laboratory technician  . Financial resource strain: Not on file  . Food insecurity:    Worry: Not on file    Inability: Not on file  . Transportation needs:    Medical: Not on file    Non-medical: Not on file  Tobacco Use  . Smoking status: Former Smoker    Last attempt to quit: 09/27/1988    Years since quitting: 29.4  . Smokeless tobacco: Never Used  Substance and Sexual Activity  . Alcohol use: No     Alcohol/week: 0.0 oz  . Drug use: No  . Sexual activity: Not on file  Lifestyle  . Physical activity:    Days per week: Not on file    Minutes per session: Not on file  . Stress: Not on file  Relationships  . Social connections:    Talks on phone: Not on file    Gets together: Not on file    Attends religious service: Not on file    Active member of club or organization: Not on file    Attends meetings of clubs or organizations: Not on file    Relationship status: Not on file  Other Topics Concern  . Not on file  Social History Narrative   Regular exercise--no   Diet: fruits and veggies, calcium, water, avoids fast food   Right handed   Caffeine use: 1 quart to 1/2 gallon of tea daily                   Outpatient Encounter Medications as of 02/14/2018  Medication Sig  . ACCU-CHEK SOFTCLIX LANCETS lancets Use to check blood sugar two times a day.  Dx:250.00 Non-Insulin  . albuterol (PROVENTIL HFA;VENTOLIN HFA) 108 (90 BASE) MCG/ACT inhaler 2 puffs every 4 hours as needed for wheezing.  Marland Kitchen aspirin 81 MG tablet Take 81 mg by mouth as needed.  . Blood Glucose Monitoring Suppl (ACCU-CHEK AVIVA) device Use to check blood sugars two times a day.  Dx:250.00 Non-Insulin  . Cholecalciferol (VITAMIN D3 PO) Take 1,000 Units by mouth daily.  . clobetasol ointment (TEMOVATE) 0.05 % Apply topically 2 (two) times daily. Use 2 to 3 times weekly  . COLCRYS 0.6 MG tablet TAKE ONE TABLET BY MOUTH ONCE DAILY  . glucose blood (ACCU-CHEK AVIVA PLUS) test strip Use to check blood sugar two times a day.  Dx:250.00 Non-Insulin  . Homeopathic Products (T-RELIEF EXTRA STRENGTH) CREA Apply topically.  . latanoprost (XALATAN) 0.005 % ophthalmic solution Place 1 drop into both eyes at bedtime.  Marland Kitchen losartan (COZAAR) 50 MG tablet TAKE 1 TABLET ONE TIME DAILY  . metFORMIN (GLUCOPHAGE-XR) 500 MG 24 hr tablet TAKE 1 TABLET TWICE DAILY  . metoprolol tartrate (LOPRESSOR) 50 MG tablet Take 1 tablet (50 mg total) by  mouth 2 (two) times daily.  . Multiple Vitamins-Minerals (CENTRUM ADULTS PO) Take 1 tablet by mouth daily.  . mupirocin ointment (BACTROBAN) 2 % Apply 1 application topically 2 (two) times  daily.  . neomycin-bacitracin-polymyxin (NEOSPORIN) ointment Apply 1 application topically as needed for wound care.   . neomycin-polymyxin-hydrocortisone (CORTISPORIN) otic solution Place 3 drops into the left ear 4 (four) times daily. (Patient taking differently: Place 3 drops into the left ear as needed. )  . RANITIDINE & DIET MANAGE PROD PO Take 2 tablets by mouth at bedtime.   . timolol (TIMOPTIC) 0.5 % ophthalmic solution Place 1 drop into both eyes daily.   . traMADol (ULTRAM) 50 MG tablet TAKE 1 TABLET FOUR TIMES DAILY  . triamcinolone cream (KENALOG) 0.1 % Apply 1 application topically 2 (two) times daily. (Patient taking differently: Apply 1 application topically as needed. )  . venlafaxine XR (EFFEXOR-XR) 75 MG 24 hr capsule TAKE 1 CAPSULE EVERY DAY WITH BREAKFAST  . VOLTAREN 1 % GEL APPLY 2 GRAM TOPICALLY 4 TIMES A DAY AS NEEDED   Facility-Administered Encounter Medications as of 02/14/2018  Medication  . betamethasone acetate-betamethasone sodium phosphate (CELESTONE) injection 3 mg  . betamethasone acetate-betamethasone sodium phosphate (CELESTONE) injection 3 mg  . betamethasone acetate-betamethasone sodium phosphate (CELESTONE) injection 3 mg    Activities of Daily Living In your present state of health, do you have any difficulty performing the following activities: 02/14/2018  Hearing? N  Vision? N  Difficulty concentrating or making decisions? Y  Walking or climbing stairs? N  Dressing or bathing? N  Doing errands, shopping? N  Preparing Food and eating ? N  Using the Toilet? N  In the past six months, have you accidently leaked urine? N  Do you have problems with loss of bowel control? N  Managing your Medications? N  Managing your Finances? N  Housekeeping or managing your  Housekeeping? N  Some recent data might be hidden    Patient Care Team: Jinny Sanders, MD as PCP - General    Assessment:   This is a routine wellness examination for [place].   Hearing Screening   125Hz  250Hz  500Hz  1000Hz  2000Hz  3000Hz  4000Hz  6000Hz  8000Hz   Right ear:   40 40 40  40    Left ear:   40 40 40  40    Vision Screening Comments: Vision exams every 6 mths with Dr. Wallace Going due to glaucoma    Exercise Activities and Dietary recommendations Current Exercise Habits: The patient does not participate in regular exercise at present, Exercise limited by: None identified  Goals    . Patient Stated     Starting 02/14/2018, I will continue to take medications as prescribed.        Fall Risk Fall Risk  02/14/2018 02/04/2017 12/10/2015 07/09/2014 06/29/2013  Falls in the past year? No Yes No No No  Comment - pt lost balance and fell on front porch - - -  Number falls in past yr: - 1 - - -  Injury with Fall? - No - - -   Depression Screen PHQ 2/9 Scores 02/14/2018 02/04/2017 12/03/2014 07/09/2014  PHQ - 2 Score 0 0 1 0  PHQ- 9 Score 0 - - -     Cognitive Function MMSE - Mini Mental State Exam 02/14/2018 02/04/2017  Orientation to time 5 5  Orientation to Place 5 5  Registration 3 3  Attention/ Calculation 0 0  Recall 3 3  Language- name 2 objects 0 0  Language- repeat 1 1  Language- follow 3 step command 3 3  Language- read & follow direction 0 0  Write a sentence 0 0  Copy design 0 0  Total score 20 20     PLEASE NOTE: A Mini-Cog screen was completed. Maximum score is 20. A value of 0 denotes this part of [place] MMSE was not completed or the patient failed this part of the Mini-Cog screening.   Mini-Cog Screening Orientation to Time - Max 5 pts Orientation to Place - Max 5 pts Registration - Max 3 pts Recall - Max 3 pts Language Repeat - Max 1 pts Language Follow 3 Step Command - Max 3 pts     Immunization History  Administered Date(s) Administered  . Influenza  Split 06/13/2012  . Influenza Whole 08/20/2003, 05/30/2007, 05/30/2008, 06/06/2009, 06/17/2010  . Influenza,inj,Quad PF,6+ Mos 06/29/2013, 07/09/2014, 04/25/2015, 05/20/2016, 05/13/2017  . Pneumococcal Conjugate-13 07/09/2014  . Pneumococcal Polysaccharide-23 06/04/2011  . Td 03/09/2004    Screening Tests Health Maintenance  Topic Date Due  . FOOT EXAM  02/16/2018 (Originally 12/03/2017)  . TETANUS/TDAP  03/08/2024 (Originally 03/09/2014)  . INFLUENZA VACCINE  03/09/2018  . OPHTHALMOLOGY EXAM  06/27/2018  . HEMOGLOBIN A1C  08/17/2018  . MAMMOGRAM  08/23/2018  . COLONOSCOPY  09/12/2023  . DEXA SCAN  Completed  . Hepatitis C Screening  Completed  . PNA vac Low Risk Adult  Completed      Plan:     I have personally reviewed, addressed, and noted the following in the patient's chart:  A. Medical and social history B. Use of alcohol, tobacco or illicit drugs  C. Current medications and supplements D. Functional ability and status E.  Nutritional status F.  Physical activity G. Advance directives H. List of other physicians I.  Hospitalizations, surgeries, and ER visits in previous 12 months J.  [place] to include hearing, vision, cognitive, depression L. Referrals and appointments - none  In addition, I have reviewed and discussed with patient certain preventive protocols, quality metrics, and best practice recommendations. A written personalized care plan for preventive services as well as general preventive health recommendations were provided to patient.  See attached scanned questionnaire for additional information.   Signed,   Lindell Noe, MHA, BS, LPN Health Coach

## 2018-02-16 ENCOUNTER — Ambulatory Visit (INDEPENDENT_AMBULATORY_CARE_PROVIDER_SITE_OTHER): Payer: Medicare HMO | Admitting: Family Medicine

## 2018-02-16 ENCOUNTER — Encounter: Payer: Self-pay | Admitting: Family Medicine

## 2018-02-16 DIAGNOSIS — R55 Syncope and collapse: Secondary | ICD-10-CM | POA: Diagnosis not present

## 2018-02-16 DIAGNOSIS — E7800 Pure hypercholesterolemia, unspecified: Secondary | ICD-10-CM

## 2018-02-16 DIAGNOSIS — I10 Essential (primary) hypertension: Secondary | ICD-10-CM

## 2018-02-16 DIAGNOSIS — N183 Chronic kidney disease, stage 3 unspecified: Secondary | ICD-10-CM

## 2018-02-16 DIAGNOSIS — E1122 Type 2 diabetes mellitus with diabetic chronic kidney disease: Secondary | ICD-10-CM

## 2018-02-16 DIAGNOSIS — R079 Chest pain, unspecified: Secondary | ICD-10-CM

## 2018-02-16 LAB — HM DIABETES FOOT EXAM

## 2018-02-16 MED ORDER — NEOMYCIN-POLYMYXIN-HC 3.5-10000-1 OT SOLN: 3.0000 [drp] | OTIC | 0 refills | Status: DC | PRN

## 2018-02-16 NOTE — Assessment & Plan Note (Signed)
Given high risk for CAD... Recommend further eval such as stress test or cath.. Pt will discuss with her cardiologist

## 2018-02-16 NOTE — Assessment & Plan Note (Signed)
Stable. Keeping up with liquids.

## 2018-02-16 NOTE — Progress Notes (Signed)
Subjective:    Patient ID: Cathy Mullins, female    DOB: 12/04/1945, 72 y.o.   MRN: [id number]  HPI  The patient presents for complete physical and review of chronic health problems. He/She also has the following acute concerns today:  The patient saw Candis Musa, LPN for medicare wellness. Note reviewed in detail and important notes copied below.  Health maintenance:  Foot exam - PCP please address at next appt A1C - completed  Abnormal screenings:   None  Patient concerns:   None   02/16/18 Today   Recent ER visit on 01/31/2018 for lightheadedness, near syncope,[facility]ial chest [facility] at [facility]  She describes the episode of squeezing heart pain radiating to her back. Summary is as follows from ER note: She had a cardiac catheterization in the year 1989 which did not show any blockages but she was told that she has a mitral valve prolapse with a soft murmur, she also had a stress test for the heart 9 to 10 years ago which was also negative. She had TIAs in the past but no actual stroke  She was observed in the hospital and ruled out for acute coronary syndrome with serial negative troponins and nondiagnostic telemetry, her echocardiogram grossly look okay and her TSH is also within normal limits and so we did not plan for any inpatient stress test but she will pursue outpatient stress test if she gets chest pain again Her diabetes and hypertension are stable in the hospital we will continue her home medications as usual  She has not had any further episodes since. She does see Dr. Rockey Situ.  Hypertension:  Good control on losartan metoprolol.  BP Readings from Last 3 Encounters:  02/16/18 128/72  02/14/18 122/60  08/12/17 130/64  Using medication without problems or lightheadedness:  Chest pain with exertion: none since ER visit Edema:none Trevor of breath: none Average home BPs: Other issues:  Elevated Cholesterol:  LDL control poor goal < 70 given TIA.  Refuses statin given severe SE in past Never tried flax seed oil, does not tolerated red yeast rice, SE to several statin meds...severe myalgia.  Trial of welchol ... She had trouble taking due to thickness and cause constipation. She refuses to try tablet form.  Lab Results  Component Value Date   CHOL 241 (H) 02/14/2018   HDL 56.70 02/14/2018   LDLCALC 130 (H) 02/04/2017   LDLDIRECT 154.0 02/14/2018   TRIG 247.0 (H) 02/14/2018   CHOLHDL 4 02/14/2018  Using medications without problems: Muscle aches:  Diet compliance: eating veggies, low fat Exercise: minimal walking given heat Other complaints:  Diabetes:   Stable control on metformin Lab Results  Component Value Date   HGBA1C 6.9 (H) 02/14/2018  Using medications without difficulties: Hypoglycemic episodes: Hyperglycemic episodes: Feet problems:no ulcers Blood Sugars averaging:not checking eye exam within last year: 06/2017   Social History /Family History/Past Medical History reviewed in detail and updated in EMR if needed. Blood pressure 128/72, pulse 64, temperature 97.9 F (36.6 C), temperature source Oral, height 5\' 3"  (1.6 m), weight 179 lb 8 oz (81.4 kg), SpO2 97 %.   Review of Systems  Constitutional: Positive for fatigue. Negative for fever.  HENT: Negative for congestion.   Eyes: Negative for pain.  Respiratory: Negative for cough and shortness of breath.   Cardiovascular: Positive for chest pain. Negative for palpitations and leg swelling.       See ER visit summary  Gastrointestinal: Negative for abdominal pain.  GERD and occ upset stomach  Genitourinary: Negative for dysuria and vaginal bleeding.  Musculoskeletal: Negative for back pain.  Neurological: Positive for light-headedness. Negative for syncope and headaches.  Psychiatric/Behavioral: Negative for dysphoric mood.      dry itchy ear , left.. Needs refill of ear drops that help her a lot. Objective:   Physical Exam  Constitutional: Vital  signs are normal. She appears well-developed and well-nourished. She is cooperative.  Non-toxic appearance. She does not appear ill. No distress.  HENT:  Head: Normocephalic.  Right Ear: Hearing, tympanic membrane, external ear and ear canal normal.  Left Ear: Hearing, tympanic membrane, external ear and ear canal normal.  Nose: Nose normal.  Eyes: Pupils are equal, round, and reactive to light. Conjunctivae, EOM and lids are normal. Lids are everted and swept, no foreign bodies found.  Neck: Trachea normal and normal range of motion. Neck supple. Carotid bruit is not present. No thyroid mass and no thyromegaly present.  Cardiovascular: Normal rate, regular rhythm, S1 normal, S2 normal, normal heart sounds and intact distal pulses. Exam reveals no gallop.  No murmur heard. Pulmonary/Chest: Effort normal and breath sounds normal. No respiratory distress. She has no wheezes. She has no rhonchi. She has no rales.  Abdominal: Soft. Normal appearance and bowel sounds are normal. She exhibits no distension, no fluid wave, no abdominal bruit and no mass. There is no hepatosplenomegaly. There is tenderness in the epigastric area. There is no rebound, no guarding and no CVA tenderness. No hernia.  Lymphadenopathy:    She has no cervical adenopathy.    She has no axillary adenopathy.  Neurological: She is alert. She has normal strength. No cranial nerve deficit or sensory deficit.  Skin: Skin is warm, dry and intact. No rash noted.  Psychiatric: Her speech is normal and behavior is normal. Judgment normal. Her mood appears not anxious. Cognition and memory are normal. She does not exhibit a depressed mood.          Assessment & Plan:  The patient's preventative maintenance and recommended screening tests for an annual wellness exam were reviewed in full today. Brought up to date unless services declined.  Counselled on the importance of diet, exercise, and its role in overall health and  mortality. The patient's FH and SH was reviewed, including their home life, tobacco status, and drug and alcohol status.   DXA: 12/2015 nml, repeat in 5 years Mammo: 08/2017, mother and daughter with breast cancer PAP/DVE: not indicated s/p abdominal hysterectomy, right ovary remaining. Sees Dr. Kennon Rounds for GYN for lichen sclerosis but has not seen in a while given controlled with coconut oil. No family history of ovarian cancer. She has chosen to stop DVEs.  Vaccines:Up to date with PNA 13, 23 and planning shingles vaccine, tdap due.  Colon cancer screening: colonoscopy Nml [id number]. Dr. Deatra Ina, repeat in 10 years.  Hep C: done nonsmoker

## 2018-02-16 NOTE — Assessment & Plan Note (Signed)
Well controlled. Continue current medication.  

## 2018-02-16 NOTE — Patient Instructions (Addendum)
Call to schedule follow up with Dr. Rockey Situ to discuss need for stress test.  Go to ER if recurrent chest pain.

## 2018-02-16 NOTE — Assessment & Plan Note (Signed)
Poor control.. Pt refuses any treatment. Increased risk of CAD.

## 2018-02-21 ENCOUNTER — Inpatient Hospital Stay: Payer: Medicare HMO | Admitting: Family Medicine

## 2018-02-21 ENCOUNTER — Other Ambulatory Visit: Payer: Self-pay | Admitting: Family Medicine

## 2018-02-21 NOTE — Telephone Encounter (Signed)
Copied from [place] [phone]. Topic: Quick Communication - Rx Refill/Question >> Feb 21, 2018 11:27 AM Neva Seat wrote: neomycin-polymyxin-hydrocortisone (CORTISPORIN) OTIC solution  Insurance is stating they need a new Rx needs to be faxed into:   [facility] [place], [place] [address] Phone: [phone] Fax: [phone]

## 2018-02-21 NOTE — Telephone Encounter (Signed)
Pharmacy is requesting clarification on dosing frequency and duration for the neomyci-polymyxin-hydrocortisone (Cortisporin) OTIC solution.    Will pend for Dr. Diona Browner to add clarification before re-sending script.

## 2018-02-22 MED ORDER — NEOMYCIN-POLYMYXIN-HC 3.5-10000-1 OT SOLN: 3.0000 [drp] | Freq: Three times a day (TID) | OTIC | 0 refills | Status: AC

## 2018-03-02 ENCOUNTER — Encounter

## 2018-03-02 ENCOUNTER — Inpatient Hospital Stay: Payer: Medicare HMO | Admitting: Family Medicine

## 2018-03-16 ENCOUNTER — Other Ambulatory Visit: Payer: Self-pay | Admitting: Family Medicine

## 2018-03-21 ENCOUNTER — Other Ambulatory Visit: Payer: Self-pay | Admitting: Otolaryngology

## 2018-03-21 DIAGNOSIS — H9202 Otalgia, left ear: Secondary | ICD-10-CM

## 2018-03-21 DIAGNOSIS — M542 Cervicalgia: Secondary | ICD-10-CM | POA: Diagnosis not present

## 2018-03-30 ENCOUNTER — Ambulatory Visit
Admission: RE | Admit: 2018-03-30 | Discharge: 2018-03-30 | Disposition: A | Discharge status: Home / Self Care | Payer: Medicare HMO | Source: Ambulatory Visit | Attending: Otolaryngology | Admitting: Otolaryngology

## 2018-03-30 DIAGNOSIS — M47812 Spondylosis without myelopathy or radiculopathy, cervical region: Secondary | ICD-10-CM | POA: Insufficient documentation

## 2018-03-30 DIAGNOSIS — H9202 Otalgia, left ear: Secondary | ICD-10-CM | POA: Diagnosis not present

## 2018-03-30 DIAGNOSIS — M4802 Spinal stenosis, cervical region: Secondary | ICD-10-CM | POA: Diagnosis not present

## 2018-04-06 DIAGNOSIS — R208 Other disturbances of skin sensation: Secondary | ICD-10-CM | POA: Diagnosis not present

## 2018-04-06 DIAGNOSIS — R51 Headache: Secondary | ICD-10-CM | POA: Diagnosis not present

## 2018-04-06 DIAGNOSIS — J449 Chronic obstructive pulmonary disease, unspecified: Secondary | ICD-10-CM | POA: Diagnosis not present

## 2018-04-06 DIAGNOSIS — M47812 Spondylosis without myelopathy or radiculopathy, cervical region: Secondary | ICD-10-CM | POA: Diagnosis not present

## 2018-04-06 DIAGNOSIS — M542 Cervicalgia: Secondary | ICD-10-CM | POA: Diagnosis not present

## 2018-04-06 DIAGNOSIS — M5030 Other cervical disc degeneration, unspecified cervical region: Secondary | ICD-10-CM | POA: Diagnosis not present

## 2018-04-21 ENCOUNTER — Other Ambulatory Visit: Payer: Self-pay | Admitting: Family Medicine

## 2018-05-24 DIAGNOSIS — D1801 Hemangioma of skin and subcutaneous tissue: Secondary | ICD-10-CM | POA: Diagnosis not present

## 2018-05-24 DIAGNOSIS — D1723 Benign lipomatous neoplasm of skin and subcutaneous tissue of right leg: Secondary | ICD-10-CM | POA: Diagnosis not present

## 2018-05-24 DIAGNOSIS — L0212 Furuncle of neck: Secondary | ICD-10-CM | POA: Diagnosis not present

## 2018-06-11 ENCOUNTER — Other Ambulatory Visit: Payer: Self-pay | Admitting: Family Medicine

## 2018-06-12 ENCOUNTER — Telehealth: Payer: Self-pay | Admitting: Family Medicine

## 2018-06-12 NOTE — Telephone Encounter (Signed)
Best number [phone] Pt called wanting a new rx for metformin er 500mg  tabs  Take 1 tablet twice daily  The insurance doesn't have this med listed   [facility] Pt has 1-2 week of meds left

## 2018-06-12 NOTE — Telephone Encounter (Signed)
Last office visit 02/16/2018 CPE.  Last refilled 01/17/2018 for #120 with 3 refills. CPE scheduled for  03/02/2019.  No UDS/Contract on file.

## 2018-06-13 MED ORDER — METFORMIN HCL ER 500 MG PO TB24: 500.0000 mg | ORAL_TABLET | Freq: Two times a day (BID) | ORAL | 1 refills | Status: DC

## 2018-06-14 ENCOUNTER — Telehealth: Payer: Self-pay | Admitting: Family Medicine

## 2018-06-14 ENCOUNTER — Other Ambulatory Visit: Payer: Self-pay | Admitting: Family Medicine

## 2018-06-14 NOTE — Telephone Encounter (Signed)
Refill request sent to Dr. Diona Browner for approval.

## 2018-06-14 NOTE — Telephone Encounter (Signed)
° ° ° °  1. Which medications need to be refilled? (please list name of each medication and dose if known)  Diclofenac Sodium Topical Gel 1%  2. Which pharmacy/location (including street and city if local pharmacy) is medication to be sent to?[place]

## 2018-06-14 NOTE — Telephone Encounter (Signed)
Last office visit 02/16/2018 for CPE.  Last refilled 07/21/2016 for 300 g with 11 refills.  Ok to refill?  Next Appt: 03/02/2019 for CPE.

## 2018-06-29 ENCOUNTER — Ambulatory Visit (INDEPENDENT_AMBULATORY_CARE_PROVIDER_SITE_OTHER): Payer: Medicare HMO

## 2018-06-29 DIAGNOSIS — Z23 Encounter for immunization: Secondary | ICD-10-CM

## 2018-07-12 ENCOUNTER — Telehealth: Payer: Self-pay
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
## 2018-07-13 ENCOUNTER — Other Ambulatory Visit: Payer: Self-pay | Admitting: *Deleted

## 2018-07-13 MED ORDER — VENLAFAXINE HCL ER 75 MG PO CP24: ORAL_CAPSULE | ORAL | 1 refills | Status: DC

## 2018-07-13 NOTE — Telephone Encounter (Signed)
Noted  

## 2018-08-31 DIAGNOSIS — H401131 Primary open-angle glaucoma, bilateral, mild stage: Secondary | ICD-10-CM | POA: Diagnosis not present

## 2018-09-07 DIAGNOSIS — E119 Type 2 diabetes mellitus without complications: Secondary | ICD-10-CM | POA: Diagnosis not present

## 2018-09-07 DIAGNOSIS — H401131 Primary open-angle glaucoma, bilateral, mild stage: Secondary | ICD-10-CM | POA: Diagnosis not present

## 2018-09-07 LAB — HM DIABETES EYE EXAM

## 2018-09-12 ENCOUNTER — Encounter: Payer: Self-pay | Admitting: Family Medicine

## 2018-10-04 ENCOUNTER — Other Ambulatory Visit: Payer: Self-pay | Admitting: Family Medicine

## 2018-10-04 MED ORDER — LOSARTAN POTASSIUM 50 MG PO TABS: 50.0000 mg | ORAL_TABLET | Freq: Every day | ORAL | 1 refills | Status: DC

## 2018-10-04 NOTE — Telephone Encounter (Signed)
Patient called to get refills on her prescriptions.  Patient said [facility] won['t call in the refills.  Losartan and Tramadol are the prescriptions she needs refilled.

## 2018-10-04 NOTE — Telephone Encounter (Signed)
Last office visit 02/16/2018 for CPE.  Last refilled 06/12/2018 for #120 with 3 refills.  CPE scheduled 03/02/2019.

## 2018-10-05 MED ORDER — TRAMADOL HCL 50 MG PO TABS: 50.0000 mg | ORAL_TABLET | Freq: Four times a day (QID) | ORAL | 3 refills | Status: DC

## 2018-10-05 NOTE — Telephone Encounter (Signed)
I Cathy Mullins sent in losartan.  You need to approve the Tramadol.

## 2018-10-05 NOTE — Telephone Encounter (Signed)
So nothing needs to be done? Why won't they fill the refills?

## 2018-10-26 ENCOUNTER — Other Ambulatory Visit: Payer: Self-pay | Admitting: Family Medicine

## 2018-11-24 ENCOUNTER — Other Ambulatory Visit: Payer: Self-pay | Admitting: *Deleted

## 2018-11-24 MED ORDER — DICLOFENAC SODIUM 1 % TD GEL: TRANSDERMAL | 1 refills | Status: DC

## 2018-12-09 ENCOUNTER — Other Ambulatory Visit: Payer: Self-pay | Admitting: Family Medicine

## 2019-02-13 ENCOUNTER — Ambulatory Visit: Payer: Medicare HMO | Admitting: Family Medicine

## 2019-02-17 ENCOUNTER — Telehealth: Payer: Self-pay | Admitting: Family Medicine

## 2019-02-19 ENCOUNTER — Telehealth: Payer: Self-pay | Admitting: Family Medicine

## 2019-02-19 NOTE — Telephone Encounter (Signed)
Pt sent following request via [place].  Appointment canceled for Cathy Mullins ([id number])  Visit Type: PHYSICAL  Date    Time   Length  Provider         Department  03/02/2019  10:00 AM 30 mins. Jinny Sanders, MD    [facility]    Reason for Cancellation: Canceled via MyChart    Patient Comments: Covid 19  Except for perforating foliculitis I am fine. Home bound since early Jan. by choice. Our daughter and [employer] deliver our food and personal needs and we stay away from everyone.  I do need a refill of Mupirocin cream (not ointment) for skin.

## 2019-02-19 NOTE — Telephone Encounter (Signed)
LOV 02/16/2018. Patient cancelled upcoming appointment for physical, see mychart message. How to proceed for refill request? Patient did not reschedule.

## 2019-02-20 NOTE — Telephone Encounter (Signed)
Pt needs virtual CPX and ideally labs prior

## 2019-02-20 NOTE — Telephone Encounter (Signed)
Robin: Call pt. Noted she is doing well but since she has not been seen in a year we need to do a virtual OV for AMW and CPX, with curbside BP check or home BP check prior to continuation of her medications. Ideally would like her to do fasting labs prior to monitor meds but can hold this if she refuses. Butch Penny or covering CMA:  Once appt is made.. okay to refill meds until appt.

## 2019-02-23 ENCOUNTER — Ambulatory Visit: Payer: Medicare HMO

## 2019-02-26 NOTE — Telephone Encounter (Signed)
FYI to Dr Diona Browner, will need to discuss with patient about setting up for CPE still

## 2019-02-26 NOTE — Telephone Encounter (Signed)
Pt wanted to schedule sick visit virtual tomorrow 7/21 she will have vitals prior to appointment

## 2019-02-27 ENCOUNTER — Ambulatory Visit (INDEPENDENT_AMBULATORY_CARE_PROVIDER_SITE_OTHER): Payer: Medicare HMO | Admitting: Family Medicine

## 2019-02-27 ENCOUNTER — Encounter: Payer: Self-pay | Admitting: Family Medicine

## 2019-02-27 VITALS — BP 134/62 | HR 65 | Temp 98.2°F | Ht 63.0 in | Wt 176.0 lb

## 2019-02-27 DIAGNOSIS — L0102 Bockhart's impetigo: Secondary | ICD-10-CM | POA: Insufficient documentation

## 2019-02-27 DIAGNOSIS — I10 Essential (primary) hypertension: Secondary | ICD-10-CM | POA: Diagnosis not present

## 2019-02-27 DIAGNOSIS — J449 Chronic obstructive pulmonary disease, unspecified: Secondary | ICD-10-CM | POA: Diagnosis not present

## 2019-02-27 DIAGNOSIS — L739 Follicular disorder, unspecified: Secondary | ICD-10-CM | POA: Diagnosis not present

## 2019-02-27 MED ORDER — LISINOPRIL 10 MG PO TABS: 10.0000 mg | ORAL_TABLET | Freq: Every day | ORAL | 3 refills | Status: DC

## 2019-02-27 MED ORDER — DOXYCYCLINE HYCLATE 100 MG PO TABS: 100.0000 mg | ORAL_TABLET | Freq: Two times a day (BID) | ORAL | 0 refills | Status: DC

## 2019-02-27 NOTE — Assessment & Plan Note (Signed)
Treat with antibiotic cleanser and oral doxy x 2 weeks. May need longer course.

## 2019-02-27 NOTE — Patient Instructions (Addendum)
Complete a course of doxycycline 100 mg twice daily.  Stop losartan and change back to lisinopril.  Follow BP at home

## 2019-02-27 NOTE — Telephone Encounter (Signed)
Agreed -

## 2019-02-27 NOTE — Assessment & Plan Note (Signed)
Change back to lisinopril as pt feels losartan triggered rash and she had no change in mild cough with move to losartan.

## 2019-02-27 NOTE — Progress Notes (Signed)
VIRTUAL VISIT Due to national recommendations of social distancing due to [place] 19, a virtual visit is felt to be most appropriate for this patient at this time.   I connected with the patient on 02/27/19 at 10:40 AM EDT by virtual telehealth platform and verified that I am speaking with the correct person using two identifiers.   I discussed the limitations, risks, security and privacy concerns of performing an evaluation and management service by  virtual telehealth platform and the availability of in person appointments. I also discussed with the patient that there may be a patient responsible charge related to this service. The patient expressed understanding and agreed to proceed.  Patient location: Home Provider Location: [place] Participants: Eliezer Lofts and Rise Paganini   Chief Complaint  Patient presents with  . Acne    C/o pimples allover body.  Noticed worsening after starting losartan.  Says acne is one of the side effects.   States she was dx with perifolliculitis.  Also, reports BS was 98 this morning.     History of Present Illness: 73 year old female with history of perifolliculitis, lichen sclerosis, DM and  HTN presents with recurrent onset pimples all over her body. She has rash on buttock, neck , torso, legs, back.  No mouth lesion.  She reports she has had red itchy bumps with pustule, then changes to a sore and finally drys out.  Different stages. Has had off and on for years... dx by biopsy with staph infection sna perifolliculitis with Derm in  Past years ago. Worse since January.  No new creams or detergents.  She feels it is worse since in January.  BP Readings from Last 3 Encounters:  02/27/19 134/62  02/16/18 128/72  02/14/18 122/60       COVID 19 screen No recent travel or known exposure to [place] The patient denies respiratory symptoms of COVID 19 at this time.  The importance of social distancing was discussed today.   Review of  Systems  Constitutional: Negative for chills and fever.  HENT: Negative for congestion and ear pain.   Eyes: Negative for pain and redness.  Respiratory: Negative for cough and shortness of breath.   Cardiovascular: Negative for chest pain, palpitations and leg swelling.  Gastrointestinal: Negative for abdominal pain, blood in stool, constipation, diarrhea, nausea and vomiting.  Genitourinary: Negative for dysuria.  Musculoskeletal: Negative for falls and myalgias.  Skin: Negative for rash.  Neurological: Negative for dizziness.  Psychiatric/Behavioral: Negative for depression. The patient is not nervous/anxious.       Past Medical History:  Diagnosis Date  . Allergy   . Asthma with COPD ([place])   . Depression    daughter was very sick, depression began during this period of time   . Diabetes mellitus   . Esophagitis   . Fibromyalgia   . GERD (gastroesophageal reflux disease)   . Glaucoma   . History of colon polyps   . HTN (hypertension)   . Hyperlipidemia   . Lichen sclerosus   . Migraine   . Perforating folliculitis   . Spinal stenosis   . Tachycardia   . TIA (transient ischemic attack)     reports that she quit smoking about 30 years ago. She has never used smokeless tobacco. She reports that she does not drink alcohol or use drugs.   Current Outpatient Medications:  .  ACCU-CHEK SOFTCLIX LANCETS lancets, Use to check blood sugar two times a day.  Dx:250.00 Non-Insulin, Disp:  200 each, Rfl: 3 .  albuterol (PROVENTIL HFA;VENTOLIN HFA) 108 (90 BASE) MCG/ACT inhaler, 2 puffs every 4 hours as needed for wheezing., Disp: 1 Inhaler, Rfl: 0 .  aspirin 81 MG tablet, Take 81 mg by mouth as needed., Disp: , Rfl:  .  Blood Glucose Monitoring Suppl (ACCU-CHEK AVIVA) device, Use to check blood sugars two times a day.  Dx:250.00 Non-Insulin, Disp: 1 each, Rfl: 0 .  Cholecalciferol (VITAMIN D3 PO), Take 1,000 Units by mouth daily., Disp: , Rfl:  .  clobetasol ointment (TEMOVATE) 0.05 %,  Apply topically 2 (two) times daily. Use 2 to 3 times weekly, Disp: 30 g, Rfl: 0 .  COLCRYS 0.6 MG tablet, TAKE ONE TABLET BY MOUTH ONCE DAILY, Disp: 30 tablet, Rfl: 5 .  diclofenac sodium (VOLTAREN) 1 % GEL, APPLY 2 GRAMS TOPICALLY FOUR TIMES A DAY AS NEEDED, Disp: 300 g, Rfl: 1 .  glucose blood (ACCU-CHEK AVIVA PLUS) test strip, Use to check blood sugar two times a day.  Dx:250.00 Non-Insulin, Disp: 200 each, Rfl: 3 .  Homeopathic Products (T-RELIEF EXTRA STRENGTH) CREA, Apply topically., Disp: , Rfl:  .  latanoprost (XALATAN) 0.005 % ophthalmic solution, Place 1 drop into both eyes at bedtime., Disp: , Rfl:  .  losartan (COZAAR) 50 MG tablet, Take 1 tablet (50 mg total) by mouth daily., Disp: 90 tablet, Rfl: 1 .  metFORMIN (GLUCOPHAGE-XR) 500 MG 24 hr tablet, TAKE 1 TABLET TWICE DAILY, Disp: 180 tablet, Rfl: 0 .  metoprolol tartrate (LOPRESSOR) 50 MG tablet, TAKE 1 TABLET TWICE DAILY, Disp: 180 tablet, Rfl: 0 .  Multiple Vitamins-Minerals (CENTRUM ADULTS PO), Take 1 tablet by mouth daily., Disp: , Rfl:  .  mupirocin ointment (BACTROBAN) 2 %, Apply 1 application topically 2 (two) times daily., Disp: 22 g, Rfl: 0 .  neomycin-bacitracin-polymyxin (NEOSPORIN) ointment, Apply 1 application topically as needed for wound care. , Disp: , Rfl:  .  RANITIDINE & DIET MANAGE PROD PO, Take 2 tablets by mouth at bedtime. , Disp: , Rfl:  .  timolol (TIMOPTIC) 0.5 % ophthalmic solution, Place 1 drop into both eyes daily. , Disp: , Rfl:  .  traMADol (ULTRAM) 50 MG tablet, Take 1 tablet (50 mg total) by mouth 4 (four) times daily., Disp: 120 tablet, Rfl: 3 .  triamcinolone cream (KENALOG) 0.1 %, Apply 1 application topically 2 (two) times daily. (Patient taking differently: Apply 1 application topically as needed. ), Disp: 30 g, Rfl: 0 .  venlafaxine XR (EFFEXOR-XR) 75 MG 24 hr capsule, TAKE 1 CAPSULE EVERY DAY WITH BREAKFAST, Disp: 90 capsule, Rfl: 0  Current Facility-Administered Medications:  .  betamethasone  acetate-betamethasone sodium phosphate (CELESTONE) injection 3 mg, 3 mg, Intramuscular, Once, Evans, Brent M, DPM .  betamethasone acetate-betamethasone sodium phosphate (CELESTONE) injection 3 mg, 3 mg, Intramuscular, Once, Evans, Brent M, DPM .  betamethasone acetate-betamethasone sodium phosphate (CELESTONE) injection 3 mg, 3 mg, Intramuscular, Once, Evans, Dorathy Daft, DPM   Observations/Objective: Blood pressure 134/62, pulse 65, temperature 98.2 F (36.8 C), height 5\' 3"  (1.6 m), weight 176 lb (79.8 kg).  Physical Exam  Physical Exam Constitutional:      General: The patient is not in acute distress. Pulmonary:     Effort: Pulmonary effort is normal. No respiratory distress.  Neurological:     Mental Status: The patient is alert and oriented to person, place, and time.  Psychiatric:        Mood and Affect: Mood normal.  Behavior: Behavior normal.  SKIN: pustules and erythematous nodules on neck, back , torso, legs, hips in ears, no rash on mucus membranes  Assessment and Plan Essential hypertension, benign Change back to lisinopril as pt feels losartan triggered rash and she had no change in mild cough with move to losartan.  Folliculitis and perifolliculitis Treat with antibiotic cleanser and oral doxy x 2 weeks. May need longer course.     I discussed the assessment and treatment plan with the patient. The patient was provided an opportunity to ask questions and all were answered. The patient agreed with the plan and demonstrated an understanding of the instructions.   The patient was advised to call back or seek an in-person evaluation if the symptoms worsen or if the condition fails to improve as anticipated.     Eliezer Lofts, MD

## 2019-03-02 ENCOUNTER — Encounter: Payer: Medicare HMO | Admitting: Family Medicine

## 2019-03-14 ENCOUNTER — Other Ambulatory Visit: Payer: Self-pay | Admitting: *Deleted

## 2019-03-14 MED ORDER — METFORMIN HCL ER 500 MG PO TB24: 500.0000 mg | ORAL_TABLET | Freq: Two times a day (BID) | ORAL | 1 refills | Status: DC

## 2019-03-14 NOTE — Progress Notes (Signed)
Pt declined to schedule she state she would call back  After covid

## 2019-03-21 ENCOUNTER — Other Ambulatory Visit: Payer: Self-pay | Admitting: Family Medicine

## 2019-04-04 ENCOUNTER — Other Ambulatory Visit: Payer: Self-pay | Admitting: Family Medicine

## 2019-04-04 NOTE — Telephone Encounter (Signed)
Patient stated that her pharmacy is requesting a new prescription sent in for her  Tramadol   [facility] pharmacy Mail order

## 2019-04-04 NOTE — Telephone Encounter (Signed)
Last office visit 02/27/2019 for acne.  Last refilled 10/05/2018 for #120 with 3 refills.  No future appointments.

## 2019-04-05 MED ORDER — TRAMADOL HCL 50 MG PO TABS: 50.0000 mg | ORAL_TABLET | Freq: Four times a day (QID) | ORAL | 3 refills | Status: DC

## 2019-04-19 ENCOUNTER — Other Ambulatory Visit (INDEPENDENT_AMBULATORY_CARE_PROVIDER_SITE_OTHER): Payer: Medicare HMO

## 2019-04-19 DIAGNOSIS — R739 Hyperglycemia, unspecified: Secondary | ICD-10-CM | POA: Diagnosis not present

## 2019-04-19 DIAGNOSIS — E785 Hyperlipidemia, unspecified: Secondary | ICD-10-CM | POA: Diagnosis not present

## 2019-04-19 DIAGNOSIS — Z79899 Other long term (current) drug therapy: Secondary | ICD-10-CM | POA: Diagnosis not present

## 2019-04-20 ENCOUNTER — Other Ambulatory Visit: Payer: Self-pay | Admitting: Family Medicine

## 2019-04-20 ENCOUNTER — Telehealth: Payer: Self-pay

## 2019-04-20 LAB — BASIC METABOLIC PANEL
BUN: 19 mg/dL (ref 6–23)
CO2: 30 mEq/L (ref 19–32)
Calcium: 10.1 mg/dL (ref 8.4–10.5)
Chloride: 100 mEq/L (ref 96–112)
Creatinine, Ser: 1.18 mg/dL (ref 0.40–1.20)
GFR: 44.9 mL/min — ABNORMAL LOW (ref >60.00)
Glucose, Bld: 110 mg/dL — ABNORMAL HIGH (ref 70–99)
Potassium: 5.4 mEq/L — ABNORMAL HIGH (ref 3.5–5.1)
Sodium: 137 mEq/L (ref 135–145)

## 2019-04-20 LAB — CBC WITH DIFFERENTIAL/PLATELET
Basophils Absolute: 0.1 10*3/uL (ref 0.0–0.1)
Basophils Relative: 1 % (ref 0.0–3.0)
Eosinophils Absolute: 0.7 10*3/uL (ref 0.0–0.7)
Eosinophils Relative: 7.5 % — ABNORMAL HIGH (ref 0.0–5.0)
HCT: 39.5 % (ref 36.0–46.0)
Hemoglobin: 13.1 g/dL (ref 12.0–15.0)
Lymphocytes Relative: 36 % (ref 12.0–46.0)
Lymphs Abs: 3.3 10*3/uL (ref 0.7–4.0)
MCHC: 33.1 g/dL (ref 30.0–36.0)
MCV: 88.9 fl (ref 78.0–100.0)
Monocytes Absolute: 0.7 10*3/uL (ref 0.1–1.0)
Monocytes Relative: 7.2 % (ref 3.0–12.0)
Neutro Abs: 4.5 10*3/uL (ref 1.4–7.7)
Neutrophils Relative %: 48.3 % (ref 43.0–77.0)
Platelets: 400 10*3/uL (ref 150.0–400.0)
RBC: 4.44 Mil/uL (ref 3.87–5.11)
RDW: 13.7 % (ref 11.5–15.5)
WBC: 9.3 10*3/uL (ref 4.0–10.5)

## 2019-04-20 LAB — HEPATIC FUNCTION PANEL
ALT: 12 U/L (ref 0–35)
AST: 14 U/L (ref 0–37)
Albumin: 4.2 g/dL (ref 3.5–5.2)
Alkaline Phosphatase: 57 U/L (ref 39–117)
Bilirubin, Direct: 0 mg/dL (ref 0.0–0.3)
Total Bilirubin: 0.3 mg/dL (ref 0.2–1.2)
Total Protein: 7.1 g/dL (ref 6.0–8.3)

## 2019-04-20 LAB — HEMOGLOBIN A1C: Hgb A1c MFr Bld: 6.8 % — ABNORMAL HIGH (ref 4.6–6.5)

## 2019-04-20 LAB — LDL CHOLESTEROL, DIRECT: Direct LDL: 165 mg/dL

## 2019-04-20 LAB — LIPID PANEL
Cholesterol: 252 mg/dL — ABNORMAL HIGH (ref 0–200)
HDL: 63.4 mg/dL (ref >39.00)
NonHDL: 188.54
Total CHOL/HDL Ratio: 4
Triglycerides: 232 mg/dL — ABNORMAL HIGH (ref 0.0–149.0)
VLDL: 46.4 mg/dL — ABNORMAL HIGH (ref 0.0–40.0)

## 2019-04-20 LAB — TSH: TSH: 4.94 u[IU]/mL — ABNORMAL HIGH (ref 0.35–4.50)

## 2019-04-20 MED ORDER — HYDROCHLOROTHIAZIDE 25 MG PO TABS: 25.0000 mg | ORAL_TABLET | Freq: Every day | ORAL | 3 refills | Status: DC

## 2019-04-20 NOTE — Telephone Encounter (Signed)
HCTZ r/x sent in to wrong pharmacy [facility]), patient needs sent in to local [facility] [address].  Coffee and resent to local as requested.   Also patient wants Dr. Diona Browner to know that she knows why her K is so high, she realizes it is her diet.  She eats a lot of high K foods and will cut this out per directions.

## 2019-04-22 NOTE — Telephone Encounter (Signed)
Noted  

## 2019-04-23 ENCOUNTER — Other Ambulatory Visit (INDEPENDENT_AMBULATORY_CARE_PROVIDER_SITE_OTHER): Payer: Medicare HMO

## 2019-04-23 DIAGNOSIS — E875 Hyperkalemia: Secondary | ICD-10-CM | POA: Diagnosis not present

## 2019-04-23 LAB — BASIC METABOLIC PANEL
BUN: 19 mg/dL (ref 6–23)
CO2: 26 mEq/L (ref 19–32)
Calcium: 9.9 mg/dL (ref 8.4–10.5)
Chloride: 99 mEq/L (ref 96–112)
Creatinine, Ser: 1.2 mg/dL (ref 0.40–1.20)
GFR: 44.03 mL/min — ABNORMAL LOW (ref >60.00)
Glucose, Bld: 168 mg/dL — ABNORMAL HIGH (ref 70–99)
Potassium: 4.1 mEq/L (ref 3.5–5.1)
Sodium: 135 mEq/L (ref 135–145)

## 2019-05-01 ENCOUNTER — Other Ambulatory Visit: Payer: Self-pay | Admitting: Family Medicine

## 2019-05-01 MED ORDER — DOXYCYCLINE HYCLATE 100 MG PO TABS: 100.0000 mg | ORAL_TABLET | Freq: Two times a day (BID) | ORAL | 0 refills | Status: DC

## 2019-05-01 NOTE — Telephone Encounter (Signed)
Will refill doxy but if rash no resolving.. needs in office visit or derm referral.

## 2019-05-01 NOTE — Telephone Encounter (Signed)
Patient called to get a refill on Tramadol.  Patient's calling early because it comes through the mail and it takes almost 3 weeks for them to receive medication. Patient uses [facility].  Patient said [facility] can fill the rx early because of the delay.

## 2019-05-01 NOTE — Telephone Encounter (Signed)
I spoke with pt and explained that on 04/05/19 # 120 x 3 of tramadol was sent to [place]. Pt voiced understanding and she will ck with [place]. Pt also said that on 02/27/19 pt had a visit and was given doxycycline for perifolliculitis and pt finished abx and all the sores were gone. 1 wk ago pt got little rows on lt side of neck of white pustules that have gotten larger like sores. Pt said Dr Diona Browner told her at the 02/27/19 visit that she may require more abx and pt request doxycycline sent to [facility]. Pt request cb after Dr Diona Browner reviews note.

## 2019-05-02 NOTE — Telephone Encounter (Signed)
Cathy Mullins notified as instructed by telephone.

## 2019-06-19 DIAGNOSIS — H401131 Primary open-angle glaucoma, bilateral, mild stage: Secondary | ICD-10-CM | POA: Diagnosis not present

## 2019-06-19 LAB — HM DIABETES EYE EXAM

## 2019-06-25 ENCOUNTER — Encounter: Payer: Self-pay | Admitting: Family Medicine

## 2019-07-14 ENCOUNTER — Telehealth: Payer: Self-pay | Admitting: Family Medicine

## 2019-07-18 MED ORDER — HYDROCHLOROTHIAZIDE 25 MG PO TABS: 25.0000 mg | ORAL_TABLET | Freq: Every day | ORAL | 0 refills | Status: DC

## 2019-07-18 NOTE — Telephone Encounter (Signed)
Refill sent to [facility].  I think it was denied because it was recently refilled at her local pharmacy.  Mrs. Banning notified of this by telephone.  She is due for her [place].  Will forward to Robin to help schedule.  She also wanted to let Dr. Diona Browner know that she has weaned off the antidepressants.  She also states since Dr. Diona Browner stopped her Lisinopril and started her on HCTZ she feels much better and has more energy.  FYI to Dr. Diona Browner.

## 2019-07-18 NOTE — Telephone Encounter (Signed)
Best number [phone]

## 2019-07-18 NOTE — Addendum Note (Signed)
Addended by: Carter Kitten on: 07/18/2019 02:41 PM   Modules accepted: Orders

## 2019-07-18 NOTE — Telephone Encounter (Signed)
Pt calling wanting to know why rx was declined.    [facility]

## 2019-07-26 ENCOUNTER — Telehealth: Payer: Self-pay | Admitting: Family Medicine

## 2019-07-26 ENCOUNTER — Ambulatory Visit (INDEPENDENT_AMBULATORY_CARE_PROVIDER_SITE_OTHER): Payer: Medicare HMO

## 2019-07-26 ENCOUNTER — Other Ambulatory Visit: Payer: Self-pay

## 2019-07-26 DIAGNOSIS — Z23 Encounter for immunization: Secondary | ICD-10-CM | POA: Diagnosis not present

## 2019-07-26 NOTE — Telephone Encounter (Signed)
Patient called today trying to schedule a phone visit with you. She stated that she is very sick, and has been for years, She wanted to speak to you to see if she needs to be referred to a specialist to make sure she does not have cancer. Next available appointment would be next Thursday (12/24) But the patient really did not want to wait that long,  What do you suggest

## 2019-07-27 NOTE — Telephone Encounter (Signed)
I am out of the office until 12/24.Marland Kitchen add her on then. If ongoing for years.. does not sound urgent.. if newer issues she can be seen at urgent care.

## 2019-07-30 NOTE — Telephone Encounter (Signed)
Patient called back and cancelled appointment.  Patient's worried about all the new cases of covid in [place].  Patient doesn't want to come in to the office.  Dr.Vaught told patient 2 years ago that she has Meniere's Disease.

## 2019-07-30 NOTE — Telephone Encounter (Signed)
I don't know what she means by " very sick". Therefor I am not sure how to help or if she needs to be seen urgently.   Please triage and see if she could do virtual  or phone visit. Sent to Rena/triage, but I am not sure if she is triaging today.Butch Penny forward elsewhere if necessary.

## 2019-07-30 NOTE — Telephone Encounter (Addendum)
I spoke with pt and she is tired and then lays down and pt feels like she stays in bed too much. Also pts upper stomach gets very hard and within 24 hrs pt has diarrhea; pt has nausea a lot and all the time pt feels like something is blowing out her ears like it is in her brain; pt feels like air is blowing around inside her head and that makes pt very dizzy and nauseated and vomiting. Pt vomits everytime she is in the car. Pt can get sick on stomach and vomit anytime when at home also.this has been going on and off for years. Has worsened over last year.I verified again that these symptoms have been going on for years. Pt does not want to go to UC ,ED or [facility] due to fear of covid. Pt scheduled a virtual appt on 08/02/19 at 10:40 with Dr Diona Browner. If pt condition changes or worsens prior to appt pt will go to UC or ED; pt voiced understanding. FYI to Dr Diona Browner.

## 2019-07-30 NOTE — Telephone Encounter (Signed)
Agreed -

## 2019-08-02 ENCOUNTER — Ambulatory Visit (INDEPENDENT_AMBULATORY_CARE_PROVIDER_SITE_OTHER): Payer: Medicare HMO | Admitting: Family Medicine

## 2019-08-02 ENCOUNTER — Encounter: Payer: Self-pay | Admitting: Family Medicine

## 2019-08-02 ENCOUNTER — Ambulatory Visit: Payer: Medicare HMO | Admitting: Family Medicine

## 2019-08-02 ENCOUNTER — Other Ambulatory Visit: Payer: Self-pay

## 2019-08-02 VITALS — Temp 96.4°F | Ht 63.0 in | Wt 171.0 lb

## 2019-08-02 DIAGNOSIS — G8929 Other chronic pain: Secondary | ICD-10-CM | POA: Diagnosis not present

## 2019-08-02 DIAGNOSIS — R112 Nausea with vomiting, unspecified: Secondary | ICD-10-CM

## 2019-08-02 DIAGNOSIS — R109 Unspecified abdominal pain: Secondary | ICD-10-CM | POA: Diagnosis not present

## 2019-08-02 DIAGNOSIS — R234 Changes in skin texture: Secondary | ICD-10-CM | POA: Diagnosis not present

## 2019-08-02 DIAGNOSIS — H9202 Otalgia, left ear: Secondary | ICD-10-CM

## 2019-08-02 NOTE — Patient Instructions (Addendum)
Start a trial of nasal steroid spray.. flonase 2 sprays per nostril daily. If ear pain is not improving.. call for in office visit or ENT follow up.  We will call with referral to GI.  Avoid greasy foods, increase fiber and water in diet.  Can try a probiotic.  Start prilosec or nexium 20 mg daily for 4-6 weeks.

## 2019-08-02 NOTE — Progress Notes (Signed)
VIRTUAL VISIT Due to national recommendations of social distancing due to [place] 19, a virtual visit is felt to be most appropriate for this patient at this time.   I connected with the patient on 08/02/19 at 10:40 AM EST by virtual telehealth platform and verified that I am speaking with the correct person using two identifiers.   I discussed the limitations, risks, security and privacy concerns of performing an evaluation and management service by  virtual telehealth platform and the availability of in person appointments. I also discussed with the patient that there may be a patient responsible charge related to this service. The patient expressed understanding and agreed to proceed.  Patient location: Home Provider Location:  [facility] [place] Participants: Cathy Mullins and Rise Paganini   Chief Complaint  Patient presents with  . Nausea  . Emesis  . Abdominal Pain  . Belching  . Ear Pain    Left-pain radiates down neck  . Changes in breast    History of Present Illness:  73 year old female pt with history of  HTN, DM, fibromyalgia, GERD, spinal stenosis presents with multiple complaints.   1. Nausea, emesis, belching and abdominal pain.Marland Kitchen ongoing for last several years, now recently worse. Daily nausea, cannot ride car without throwing up.  Has heartburn Cramping pain in upper,central abdomen.. followed by BM.Marland Kitchen no diarrhea, constipation. Improved some with BM... nausea improves. Feeling very gassy. Uses OTC " stomach pill"  Dx in past IBS   GI: seen Dr. Deatra Ina in past.. colonoscopy unremarkable, hemorrhoids and tics.  no recent  endoscopy  2. ear pain, left : pain radiates down neck.  Stabbing pains in ear ongoing x 3 years, now worse, constant ear pain.  Has been using  Saline azelastine  Has seen Dr. Pryor Ochoa ENT in past  MRI neck: 03/2018 IMPRESSION 1. No acute osseous or cord signal abnormality. 2. Cervical spondylosis with prominent facet arthropathy greatest  at the C5-6 level. 3. Moderate bilateral C5-6 foraminal stenosis. Multilevel mild foraminal stenosis. 4. No significant canal stenosis.  3. Changes in her breast, left... nipple point down more than the right in last few weeks No nipple discharge, no pucker.  No breast pain, no lumps.  Last mammogram 08/23/2017  COVID 19 screen No recent travel or known exposure to [place] The patient denies respiratory symptoms of COVID 19 at this time.  The importance of social distancing was discussed today.   Review of Systems  Constitutional: Negative for chills and fever.  HENT: Positive for ear pain. Negative for congestion.   Eyes: Negative for pain and redness.  Respiratory: Negative for cough and shortness of breath.   Cardiovascular: Negative for chest pain, palpitations and leg swelling.  Gastrointestinal: Positive for abdominal pain, nausea and vomiting.  Genitourinary: Negative for dysuria.  Musculoskeletal: Positive for neck pain. Negative for falls and myalgias.  Skin: Negative for rash.  Neurological: Negative for dizziness.  Psychiatric/Behavioral: Negative for depression. The patient is not nervous/anxious.       Past Medical History:  Diagnosis Date  . Allergy   . Asthma with COPD ([place])   . Depression    daughter was very sick, depression began during this period of time   . Diabetes mellitus   . Esophagitis   . Fibromyalgia   . GERD (gastroesophageal reflux disease)   . Glaucoma   . History of colon polyps   . HTN (hypertension)   . Hyperlipidemia   . Lichen sclerosus   . Migraine   .  Perforating folliculitis   . Spinal stenosis   . Tachycardia   . TIA (transient ischemic attack)     reports that she quit smoking about 30 years ago. She has never used smokeless tobacco. She reports that she does not drink alcohol or use drugs.   Current Outpatient Medications:  .  ACCU-CHEK SOFTCLIX LANCETS lancets, Use to check blood sugar two times a day.  Dx:250.00  Non-Insulin, Disp: 200 each, Rfl: 3 .  albuterol (PROVENTIL HFA;VENTOLIN HFA) 108 (90 BASE) MCG/ACT inhaler, 2 puffs every 4 hours as needed for wheezing., Disp: 1 Inhaler, Rfl: 0 .  aspirin 81 MG tablet, Take 81 mg by mouth as needed., Disp: , Rfl:  .  Blood Glucose Monitoring Suppl (ACCU-CHEK AVIVA) device, Use to check blood sugars two times a day.  Dx:250.00 Non-Insulin, Disp: 1 each, Rfl: 0 .  Cholecalciferol (VITAMIN D3 PO), Take 1,000 Units by mouth daily., Disp: , Rfl:  .  clobetasol ointment (TEMOVATE) 0.05 %, Apply topically 2 (two) times daily. Use 2 to 3 times weekly, Disp: 30 g, Rfl: 0 .  COLCRYS 0.6 MG tablet, TAKE ONE TABLET BY MOUTH ONCE DAILY, Disp: 30 tablet, Rfl: 5 .  diclofenac sodium (VOLTAREN) 1 % GEL, APPLY 2 GRAMS TOPICALLY FOUR TIMES A DAY AS NEEDED, Disp: 300 g, Rfl: 1 .  glucose blood (ACCU-CHEK AVIVA PLUS) test strip, Use to check blood sugar two times a day.  Dx:250.00 Non-Insulin, Disp: 200 each, Rfl: 3 .  Homeopathic Products (T-RELIEF EXTRA STRENGTH) CREA, Apply topically., Disp: , Rfl:  .  hydrochlorothiazide (HYDRODIURIL) 25 MG tablet, Take 1 tablet (25 mg total) by mouth daily., Disp: 90 tablet, Rfl: 0 .  latanoprost (XALATAN) 0.005 % ophthalmic solution, Place 1 drop into both eyes at bedtime., Disp: , Rfl:  .  metFORMIN (GLUCOPHAGE-XR) 500 MG 24 hr tablet, Take 1 tablet (500 mg total) by mouth 2 (two) times daily., Disp: 180 tablet, Rfl: 1 .  metoprolol tartrate (LOPRESSOR) 50 MG tablet, TAKE 1 TABLET TWICE DAILY, Disp: 180 tablet, Rfl: 1 .  Multiple Vitamins-Minerals (CENTRUM ADULTS PO), Take 1 tablet by mouth daily., Disp: , Rfl:  .  mupirocin ointment (BACTROBAN) 2 %, Apply 1 application topically 2 (two) times daily., Disp: 22 g, Rfl: 0 .  neomycin-bacitracin-polymyxin (NEOSPORIN) ointment, Apply 1 application topically as needed for wound care. , Disp: , Rfl:  .  timolol (TIMOPTIC) 0.5 % ophthalmic solution, Place 1 drop into both eyes daily. , Disp: , Rfl:   .  traMADol (ULTRAM) 50 MG tablet, Take 1 tablet (50 mg total) by mouth 4 (four) times daily., Disp: 120 tablet, Rfl: 3  Current Facility-Administered Medications:  .  betamethasone acetate-betamethasone sodium phosphate (CELESTONE) injection 3 mg, 3 mg, Intramuscular, Once, Evans, Brent M, DPM .  betamethasone acetate-betamethasone sodium phosphate (CELESTONE) injection 3 mg, 3 mg, Intramuscular, Once, Evans, Brent M, DPM .  betamethasone acetate-betamethasone sodium phosphate (CELESTONE) injection 3 mg, 3 mg, Intramuscular, Once, Evans, Dorathy Daft, DPM   Observations/Objective: Temperature (!) 96.4 F (35.8 C), temperature source Oral, height 5\' 3"  (1.6 m), weight 171 lb (77.6 kg).  Physical Exam  Physical Exam Constitutional:      General: The patient is not in acute distress. Pulmonary:     Effort: Pulmonary effort is normal. No respiratory distress.  Neurological:     Mental Status: The patient is alert and oriented to person, place, and time.  Psychiatric:        Mood and Affect: Mood normal.  Behavior: Behavior normal.   Assessment and Plan    I discussed the assessment and treatment plan with the patient. The patient was provided an opportunity to ask questions and all were answered. The patient agreed with the plan and demonstrated an understanding of the instructions.   The patient was advised to call back or seek an in-person evaluation if the symptoms worsen or if the condition fails to improve as anticipated.   Nausea and vomiting Referral to GI.  Avoid greasy foods, increase fiber and water in diet.  Can try a probiotic.  Start prilosec or nexium 20 mg daily for 4-6 weeks.   Past Dx of IBS    Chronic left ear pain UNclear etiology. Possibly referred from neck Start a trial of nasal steroid spray.. flonase 2 sprays per nostril daily. If ear pain is not improving.. call for in office visit or ENT follow up.  May need referral to spine specialist.  Change  of skin of breast Refer for mammogram    Cathy Lofts, MD

## 2019-08-05 ENCOUNTER — Other Ambulatory Visit: Payer: Self-pay | Admitting: Family Medicine

## 2019-08-13 ENCOUNTER — Other Ambulatory Visit: Payer: Self-pay | Admitting: *Deleted

## 2019-08-13 MED ORDER — TRAMADOL HCL 50 MG PO TABS: 50.0000 mg | ORAL_TABLET | Freq: Four times a day (QID) | ORAL | 3 refills | Status: DC

## 2019-08-13 NOTE — Telephone Encounter (Signed)
Last office visit 08/02/2019 for N&V, Abdominal cramping and change of skin of breast.  Last refilled 04/05/2019 for #120 with 3 refills.  CPE scheduled for 10/05/2019.

## 2019-08-15 ENCOUNTER — Ambulatory Visit
Admission: RE | Admit: 2019-08-15 | Discharge: 2019-08-15 | Disposition: A | Discharge status: Home / Self Care | Payer: Medicare HMO | Source: Ambulatory Visit | Attending: Family Medicine | Admitting: Family Medicine

## 2019-08-15 DIAGNOSIS — R928 Other abnormal and inconclusive findings on diagnostic imaging of breast: Secondary | ICD-10-CM | POA: Diagnosis not present

## 2019-08-15 DIAGNOSIS — N6459 Other signs and symptoms in breast: Secondary | ICD-10-CM | POA: Diagnosis not present

## 2019-08-15 DIAGNOSIS — R234 Changes in skin texture: Secondary | ICD-10-CM | POA: Diagnosis not present

## 2019-08-15 DIAGNOSIS — N6489 Other specified disorders of breast: Secondary | ICD-10-CM | POA: Diagnosis not present

## 2019-08-17 ENCOUNTER — Other Ambulatory Visit: Payer: Self-pay | Admitting: Family Medicine

## 2019-09-05 DIAGNOSIS — R112 Nausea with vomiting, unspecified: Secondary | ICD-10-CM | POA: Insufficient documentation

## 2019-09-05 DIAGNOSIS — G8929 Other chronic pain: Secondary | ICD-10-CM | POA: Insufficient documentation

## 2019-09-05 DIAGNOSIS — R234 Changes in skin texture: Secondary | ICD-10-CM | POA: Insufficient documentation

## 2019-09-05 NOTE — Assessment & Plan Note (Signed)
Refer for mammogram.  

## 2019-09-05 NOTE — Assessment & Plan Note (Signed)
UNclear etiology. Possibly referred from neck Start a trial of nasal steroid spray.. flonase 2 sprays per nostril daily. If ear pain is not improving.. call for in office visit or ENT follow up.  May need referral to spine specialist.

## 2019-09-05 NOTE — Assessment & Plan Note (Addendum)
Referral to GI.  Avoid greasy foods, increase fiber and water in diet.  Can try a probiotic.  Start prilosec or nexium 20 mg daily for 4-6 weeks.   Past Dx of IBS

## 2019-09-21 ENCOUNTER — Telehealth: Payer: Self-pay | Admitting: Family Medicine

## 2019-09-21 ENCOUNTER — Ambulatory Visit: Payer: Medicare HMO

## 2019-09-21 ENCOUNTER — Other Ambulatory Visit: Payer: Medicare HMO

## 2019-09-21 DIAGNOSIS — R7989 Other specified abnormal findings of blood chemistry: Secondary | ICD-10-CM

## 2019-09-21 DIAGNOSIS — E7800 Pure hypercholesterolemia, unspecified: Secondary | ICD-10-CM

## 2019-09-21 DIAGNOSIS — N1831 Chronic kidney disease, stage 3a: Secondary | ICD-10-CM

## 2019-09-21 DIAGNOSIS — I10 Essential (primary) hypertension: Secondary | ICD-10-CM

## 2019-09-21 NOTE — Telephone Encounter (Signed)
-----   Message from Cloyd Stagers, RT sent at 09/12/2019  1:42 PM EST ----- Regarding: Lab Orders for Monday 2.15.2021 Please place lab orders for Monday 2.15.2021, Doxy.em visit for physical on Tuesday 2.16.2021 Thank you, Dyke Maes RT(R)

## 2019-09-24 ENCOUNTER — Other Ambulatory Visit: Payer: Medicare HMO

## 2019-09-25 ENCOUNTER — Encounter: Payer: Medicare HMO | Admitting: Family Medicine

## 2019-09-26 ENCOUNTER — Other Ambulatory Visit: Payer: Self-pay

## 2019-09-26 ENCOUNTER — Encounter: Payer: Self-pay | Admitting: Gastroenterology

## 2019-09-26 ENCOUNTER — Ambulatory Visit (INDEPENDENT_AMBULATORY_CARE_PROVIDER_SITE_OTHER): Payer: Medicare HMO | Admitting: Gastroenterology

## 2019-09-26 VITALS — BP 138/83 | HR 80 | Temp 97.2°F | Ht 63.0 in | Wt 176.4 lb

## 2019-09-26 DIAGNOSIS — R112 Nausea with vomiting, unspecified: Secondary | ICD-10-CM

## 2019-09-26 DIAGNOSIS — K591 Functional diarrhea: Secondary | ICD-10-CM | POA: Diagnosis not present

## 2019-09-26 MED ORDER — DESIPRAMINE HCL 25 MG PO TABS: 25.0000 mg | ORAL_TABLET | Freq: Every day | ORAL | 0 refills | Status: DC

## 2019-09-26 NOTE — Progress Notes (Signed)
Gastroenterology Consultation  Referring Provider:     Jinny Sanders, MD Primary Care Physician:  Jinny Sanders, MD Primary Gastroenterologist:  Dr. Allen Norris     Reason for Consultation:     Nausea, vomiting and diarrhea        HPI:   Cathy Mullins is a 74 y.o. y/o female referred for consultation & management of nausea and vomiting with diarrhea.  by Dr. Diona Browner, Mervyn Gay, MD.  The patient has been having problems with her left ear for years and have these symptoms for 5 years. She has seen multiple specialist.  The patient reports that she has done a lot of research on her condition and she believes that she is having a problem with her left-sided Eustachian tube.  She has sharp pains that she reports debilitate her in her left ear.  She then thinks that that is the cause of her nausea vomiting diarrhea.  She reports that the nausea vomiting diarrhea is very intermittent and not related to any food.  She also reports that sometimes it is exacerbated by movement so that is why she thinks this is all related to her ears and not her GI tract.  There is no report of any unexplained weight loss fevers chills nausea or vomiting.  She states that all the symptoms have been going on for 5 years as stated above but has gotten worse in the last 2 years.  The patient has had a colonoscopy in 2003, 2007 and again in 2015.  These were done by physicians in [place].  The patient was found to have internal hemorrhoids but otherwise a normal exam.  Past Medical History:  Diagnosis Date  . Allergy   . Asthma with COPD ([place])   . Depression    daughter was very sick, depression began during this period of time   . Diabetes mellitus   . Esophagitis   . Fibromyalgia   . GERD (gastroesophageal reflux disease)   . Glaucoma   . History of colon polyps   . HTN (hypertension)   . Hyperlipidemia   . Lichen sclerosus   . Migraine   . Perforating folliculitis   . Spinal stenosis   . Tachycardia   . TIA  (transient ischemic attack)     Past Surgical History:  Procedure Laterality Date  . ABDOMINAL HYSTERECTOMY     right ovary remaining  . APPENDECTOMY    . BREAST CYST ASPIRATION Bilateral   . BREAST EXCISIONAL BIOPSY Right 1982   NEG  . BREAST SURGERY     right(benign)  . CARPAL TUNNEL RELEASE    . CESAREAN SECTION    . EPIDURAL BLOCK INJECTION  2013  . KNEE ARTHROSCOPY W/ MENISCAL REPAIR     left  . LIPOMA EXCISION     2 times left leg  . lumpectomy    . TONSILLECTOMY    . TRIGGER FINGER RELEASE      Prior to Admission medications   Medication Sig Start Date End Date Taking? Authorizing Provider  ACCU-CHEK SOFTCLIX LANCETS lancets Use to check blood sugar two times a day.  Dx:250.00 Non-Insulin 09/03/13   Bedsole, Amy E, MD  albuterol (PROVENTIL HFA;VENTOLIN HFA) 108 (90 BASE) MCG/ACT inhaler 2 puffs every 4 hours as needed for wheezing. 04/17/12   Copland, Frederico Hamman, MD  aspirin 81 MG tablet Take 81 mg by mouth as needed.    [provider]  Blood Glucose Monitoring Suppl (ACCU-CHEK AVIVA) device Use to  check blood sugars two times a day.  Dx:250.00 Non-Insulin 09/03/13   Bedsole, Amy E, MD  Cholecalciferol (VITAMIN D3 PO) Take 1,000 Units by mouth daily.    [provider]  clobetasol ointment (TEMOVATE) 0.05 % Apply topically 2 (two) times daily. Use 2 to 3 times weekly 01/18/17   Bedsole, Amy E, MD  COLCRYS 0.6 MG tablet TAKE ONE TABLET BY MOUTH ONCE DAILY 02/21/17   Copland, Frederico Hamman, MD  diclofenac sodium (VOLTAREN) 1 % GEL APPLY 2 GRAMS TOPICALLY FOUR TIMES A DAY AS NEEDED 11/24/18   Bedsole, Amy E, MD  glucose blood (ACCU-CHEK AVIVA PLUS) test strip Use to check blood sugar two times a day.  Dx:250.00 Non-Insulin 09/03/13   Bedsole, Amy E, MD  Homeopathic Products (T-RELIEF EXTRA STRENGTH) CREA Apply topically.    [provider]  hydrochlorothiazide (HYDRODIURIL) 25 MG tablet Take 1 tablet (25 mg total) by mouth daily. 07/18/19   Bedsole, Amy E, MD    latanoprost (XALATAN) 0.005 % ophthalmic solution Place 1 drop into both eyes at bedtime.    [provider]  metFORMIN (GLUCOPHAGE-XR) 500 MG 24 hr tablet TAKE 1 TABLET TWICE DAILY 08/06/19   Bedsole, Amy E, MD  metoprolol tartrate (LOPRESSOR) 50 MG tablet TAKE 1 TABLET TWICE DAILY 08/17/19   Bedsole, Amy E, MD  Multiple Vitamins-Minerals (CENTRUM ADULTS PO) Take 1 tablet by mouth daily.    [provider]  mupirocin ointment (BACTROBAN) 2 % Apply 1 application topically 2 (two) times daily. 01/08/16   Bedsole, Amy E, MD  neomycin-bacitracin-polymyxin (NEOSPORIN) ointment Apply 1 application topically as needed for wound care.     [provider]  timolol (TIMOPTIC) 0.5 % ophthalmic solution Place 1 drop into both eyes daily.  09/05/13   [provider]  traMADol (ULTRAM) 50 MG tablet Take 1 tablet (50 mg total) by mouth 4 (four) times daily. 08/13/19   Jinny Sanders, MD    Family History  Problem Relation Age of Onset  . Cancer Mother        breast  . Stroke Mother   . Valvular heart disease Mother   . Breast cancer Mother 21  . Alzheimer's disease Mother   . Kidney disease Father   . Stroke Father   . Coronary artery disease Father   . Parkinson's disease Father   . Diabetes Brother   . Alzheimer's disease Maternal Grandmother   . Diabetes Maternal Aunt   . Alzheimer's disease Maternal Aunt   . Alzheimer's disease Maternal Aunt   . Colon cancer Neg Hx   . Pancreatic cancer Neg Hx   . Stomach cancer Neg Hx      Social History   Tobacco Use  . Smoking status: Former Smoker    Quit date: 09/27/1988    Years since quitting: 31.0  . Smokeless tobacco: Never Used  Substance Use Topics  . Alcohol use: No    Alcohol/week: 0.0 standard drinks  . Drug use: No    Allergies as of 09/26/2019 - Review Complete 08/02/2019  Allergen Reaction Noted  . Amoxicillin-pot clavulanate    . Codeine    . Hydrocodone-acetaminophen    . Lovastatin    .  Sulfonamide derivatives    . Tizanidine  05/05/2012    Review of Systems:    All systems reviewed and negative except where noted in HPI.   Physical Exam:  There were no vitals taken for this visit. No LMP recorded. Patient has had a hysterectomy.  General:   Alert,  Well-developed, well-nourished, pleasant and cooperative in NAD Head:  Normocephalic and atraumatic. Eyes:  Sclera clear, no icterus.   Conjunctiva pink. Ears:  Normal auditory acuity. Neck:  Supple; no masses or thyromegaly. Lungs:  Respirations even and unlabored.  Clear throughout to auscultation.   No wheezes, crackles, or rhonchi. No acute distress. Heart:  Regular rate and rhythm; no murmurs, clicks, rubs, or gallops. Abdomen:  Normal bowel sounds.  No bruits.  Soft, non-tender and non-distended without masses, hepatosplenomegaly or hernias noted.  No guarding or rebound tenderness.  Negative Carnett sign.   Rectal:  Deferred.  Pulses:  Normal pulses noted. Extremities:  No clubbing or edema.  No cyanosis. Neurologic:  Alert and oriented x3;  grossly normal neurologically. Skin:  Intact without significant lesions or rashes.  No jaundice. Lymph Nodes:  No significant cervical adenopathy. Psych:  Alert and cooperative. Normal mood and affect.  Imaging Studies: No results found.  Assessment and Plan:   Cathy Mullins is a 74 y.o. y/o female who comes in today with a report of nausea vomiting diarrhea that she states only comes when her ear pain is excruciating.  She denies any other GI symptoms and she tells me that she doubts her symptoms are GI related at all.  The patient has had a colonoscopy that was normal in 2015 and was not recommended to have another colonoscopy for 10 years.  She denies any dyspepsia reflux or any other upper GI symptoms that would explain her nausea vomiting and diarrhea.  The patient will follow up with her ear nose and throat doctor and states that she is seeking out a specialist to and take  care of her ear problem.    Lucilla Lame, MD. Marval Regal    Note: This dictation was prepared with Dragon dictation along with smaller phrase technology. Any transcriptional errors that result from this process are unintentional.

## 2019-09-27 ENCOUNTER — Encounter: Payer: Self-pay | Admitting: Family Medicine

## 2019-09-27 ENCOUNTER — Other Ambulatory Visit: Payer: Self-pay

## 2019-09-27 ENCOUNTER — Ambulatory Visit (INDEPENDENT_AMBULATORY_CARE_PROVIDER_SITE_OTHER): Payer: Medicare HMO | Admitting: Family Medicine

## 2019-09-27 DIAGNOSIS — G521 Disorders of glossopharyngeal nerve: Secondary | ICD-10-CM | POA: Diagnosis not present

## 2019-09-27 DIAGNOSIS — J449 Chronic obstructive pulmonary disease, unspecified: Secondary | ICD-10-CM | POA: Diagnosis not present

## 2019-09-27 DIAGNOSIS — G8929 Other chronic pain: Secondary | ICD-10-CM | POA: Diagnosis not present

## 2019-09-27 DIAGNOSIS — R112 Nausea with vomiting, unspecified: Secondary | ICD-10-CM | POA: Diagnosis not present

## 2019-09-27 DIAGNOSIS — H9202 Otalgia, left ear: Secondary | ICD-10-CM | POA: Diagnosis not present

## 2019-09-27 MED ORDER — DESIPRAMINE HCL 25 MG PO TABS: 25.0000 mg | ORAL_TABLET | Freq: Every day | ORAL | 1 refills | Status: DC

## 2019-09-27 NOTE — Patient Instructions (Signed)
We are committed to keeping you informed about the COVID-19 vaccine.  As the vaccine continues to become available for each phase, we will ensure that patients who meet the criteria receive the information they need to access vaccination opportunities. Continue to check your MyChart account and [email or web address] for updates. Please review the Phase 1b information below.  [place] On Tuesday, Jan. 19, the [facility]) and [place] began large-scale COVID-19 vaccinations at the [place]. The vaccinations are appointment only and for those 20 and older.  Walk-ins will not be accepted.  All appointments are currently filled. Please join our waiting list for the next available appointments. We will contact you when appointments become available. Please do not sign up more than once.  Join Our Waiting List  Our daily vaccination capacity will continue to increase, including expansion of our [employer] site, increasing mobile clinics across our service region and plans to open COVID-19 vaccination clinics in [place] and [place] counties.  Other Vaccination Opportunities in [place] We are also working in partnership with county health agencies in our service counties to ensure continuing vaccination availability in the weeks and months ahead. Learn more about each county's vaccination efforts in the website links below:   [place] 's phase 1b vaccination guidelines, prioritizing those 65 and over as the next eligible group to receive the COVID-19 vaccine, are detailed at [email or web address].   Vaccine Safety and Effectiveness Clinical trials for the Pfizer COVID-19 vaccine involved 42,000 people and showed that the vaccine is more than 95% effective in preventing COVID-19 with no serious  safety concerns. Similar results have been reported for the Moderna COVID-19 vaccine. Side effects reported in the [place] clinical trials include a sore arm at the injection site, fatigue, headache, chills and fever. While side effects from the [place] COVID-19 vaccine are higher than for a typical flu vaccine, they are lower in many ways than side effects from the leading vaccine to prevent shingles. Side effects are signs that a vaccine is working and are related to your immune system being stimulated to produce antibodies against infection. Side effects from vaccination are far less significant than health impacts from COVID-19.  Staying Informed Pharmacists, infectious disease doctors, critical care nurses and other experts at [facility] continue to speak publicly through media interviews and direct communication with our patients and communities about the safety, effectiveness and importance of vaccines to eliminate COVID-19. In addition, reliable information on vaccine safety, effectiveness, side effects and more is available on the following websites:  [place] Department of Health and Human Services COVID-19 Vaccine Information Website.  U.S. Centers for Disease Control and Prevention XX123456 Human resources officer.  Staying Safe We agree with the CDC on what we can do to help our communities get back to normal: Getting "back to normal" is going to take all of our tools. If we use all the tools we have, we stand the best chance of getting our families, communities, schools and workplaces "back to normal" sooner:  Get vaccinated as soon as vaccines become available within the phase of the state's vaccination rollout plan for which you meet the eligibility criteria.  Wear a mask.  Stay 6 feet from others and avoid crowds.  Wash hands often.  For our most current information, please visit [email or web address].

## 2019-09-27 NOTE — Progress Notes (Signed)
VIRTUAL VISIT Due to national recommendations of social distancing due to [place] 19, a virtual visit is felt to be most appropriate for this patient at this time.   I connected with the patient on 09/27/19 at  2:00 PM EST by virtual telehealth platform and verified that I am speaking with the correct person using two identifiers.   I discussed the limitations, risks, security and privacy concerns of performing an evaluation and management service by  virtual telehealth platform and the availability of in person appointments. I also discussed with the patient that there may be a patient responsible charge related to this service. The patient expressed understanding and agreed to proceed.  Patient location: Home Provider Location: [facility] [place] Participants: Eliezer Lofts and Rise Paganini   Chief Complaint  Patient presents with  . Nausea    ongoing chronic  . Otalgia    left  . Dizziness    History of Present Illness:  74 year old female presents for follow up chronic nausea vomiting and diarrhea. Daily nausea.. cannot ride in car without emesis.  Recurrent sharp pain in left face, left ear , base of tounge.. electric shocking pain episodes.. ... ongoing for years, gradually worsening. Can keep her up all night  MRI brain 2016 unremarkable. Eval by  ENT Dr. Pryor Ochoa,  As well as neurologist in 2016 and by neck specialist  unrevealing in past per patient. MRI neck: 03/2018 IMPRESSION 1. No acute osseous or cord signal abnormality. 2. Cervical spondylosis with prominent facet arthropathy greatest at the C5-6 level. 3. Moderate bilateral C5-6 foraminal stenosis. Multilevel mild foraminal stenosis. 4. No significant canal stenosis.    She was seen by GI Dr. Allen Norris yesterday but it was felt that nausea and abdominal pain as due to ear pain triggering it... it was not a GI issue.  Note from  09/26/2019 reviewed in detail.  No eval or work up performed. Recommended further ENT eval.   Pt started on desipramine for  Ear pain.. she has not tried it yet.  Colonoscopy in 2003, 2007 and again in 2015.  These were done by physicians in [place].  The patient was found to have internal hemorrhoids but otherwise a normal exam.  Continued ear pain, left : pain radiates down neck.  Stabbing pains in ear ongoing x 3-5 years, now worse, constant ear pain.  Has been using  Saline azelastine    At last appt with me on 12/24... recommended trail of flonase for ear pain. She has stopped given  It caused nosebleed. Saline has helped clear out nasal passage.  PPI for belching and nausea... did not help much. Has stopped PPI   She is  wondering if she has glossopharyngeal  neuralgia.   She also has electric sensations on tounge feels like it is swollen at times, bites tounge. Benadryl seems to help with this.    COVID 19 screen No recent travel or known exposure to COVID19 The patient denies respiratory symptoms of COVID 19 at this time.  The importance of social distancing was discussed today.   Review of Systems  Constitutional: Negative for chills and fever.  HENT: Positive for ear pain. Negative for congestion.   Eyes: Negative for pain and redness.  Respiratory: Negative for cough and shortness of breath.   Cardiovascular: Negative for chest pain, palpitations and leg swelling.  Gastrointestinal: Positive for abdominal pain, diarrhea, nausea and vomiting. Negative for blood in stool and constipation.  Genitourinary: Negative for dysuria.  Musculoskeletal:  Negative for falls and myalgias.  Skin: Negative for rash.  Neurological: Negative for dizziness.  Psychiatric/Behavioral: Negative for depression. The patient is not nervous/anxious.       Past Medical History:  Diagnosis Date  . Allergy   . Asthma with COPD ([place])   . Depression    daughter was very sick, depression began during this period of time   . Diabetes mellitus   . Esophagitis   . Fibromyalgia   . GERD  (gastroesophageal reflux disease)   . Glaucoma   . History of colon polyps   . HTN (hypertension)   . Hyperlipidemia   . Lichen sclerosus   . Migraine   . Perforating folliculitis   . Spinal stenosis   . Tachycardia   . TIA (transient ischemic attack)     reports that she quit smoking about 31 years ago. She has never used smokeless tobacco. She reports that she does not drink alcohol or use drugs.   Current Outpatient Medications:  .  ACCU-CHEK SOFTCLIX LANCETS lancets, Use to check blood sugar two times a day.  Dx:250.00 Non-Insulin, Disp: 200 each, Rfl: 3 .  albuterol (PROVENTIL HFA;VENTOLIN HFA) 108 (90 BASE) MCG/ACT inhaler, 2 puffs every 4 hours as needed for wheezing., Disp: 1 Inhaler, Rfl: 0 .  aspirin 81 MG tablet, Take 81 mg by mouth as needed., Disp: , Rfl:  .  Blood Glucose Monitoring Suppl (ACCU-CHEK AVIVA) device, Use to check blood sugars two times a day.  Dx:250.00 Non-Insulin, Disp: 1 each, Rfl: 0 .  Cholecalciferol (VITAMIN D3 PO), Take 1,000 Units by mouth daily., Disp: , Rfl:  .  clobetasol ointment (TEMOVATE) 0.05 %, Apply topically 2 (two) times daily. Use 2 to 3 times weekly, Disp: 30 g, Rfl: 0 .  COLCRYS 0.6 MG tablet, TAKE ONE TABLET BY MOUTH ONCE DAILY, Disp: 30 tablet, Rfl: 5 .  desipramine (NORPRAMIN) 25 MG tablet, Take 1 tablet (25 mg total) by mouth at bedtime., Disp: 30 tablet, Rfl: 0 .  diclofenac sodium (VOLTAREN) 1 % GEL, APPLY 2 GRAMS TOPICALLY FOUR TIMES A DAY AS NEEDED, Disp: 300 g, Rfl: 1 .  glucose blood (ACCU-CHEK AVIVA PLUS) test strip, Use to check blood sugar two times a day.  Dx:250.00 Non-Insulin, Disp: 200 each, Rfl: 3 .  Homeopathic Products (T-RELIEF EXTRA STRENGTH) CREA, Apply topically., Disp: , Rfl:  .  hydrochlorothiazide (HYDRODIURIL) 25 MG tablet, Take 1 tablet (25 mg total) by mouth daily., Disp: 90 tablet, Rfl: 0 .  latanoprost (XALATAN) 0.005 % ophthalmic solution, Place 1 drop into both eyes at bedtime., Disp: , Rfl:  .  Magnesium  200 MG TABS, Take by mouth., Disp: , Rfl:  .  metFORMIN (GLUCOPHAGE-XR) 500 MG 24 hr tablet, TAKE 1 TABLET TWICE DAILY, Disp: 180 tablet, Rfl: 2 .  metoprolol tartrate (LOPRESSOR) 50 MG tablet, TAKE 1 TABLET TWICE DAILY, Disp: 180 tablet, Rfl: 1 .  Multiple Vitamins-Minerals (CENTRUM ADULTS PO), Take 1 tablet by mouth daily., Disp: , Rfl:  .  mupirocin ointment (BACTROBAN) 2 %, Apply 1 application topically 2 (two) times daily., Disp: 22 g, Rfl: 0 .  neomycin-bacitracin-polymyxin (NEOSPORIN) ointment, Apply 1 application topically as needed for wound care. , Disp: , Rfl:  .  timolol (TIMOPTIC) 0.5 % ophthalmic solution, Place 1 drop into both eyes daily. , Disp: , Rfl:  .  traMADol (ULTRAM) 50 MG tablet, Take 1 tablet (50 mg total) by mouth 4 (four) times daily., Disp: 120 tablet, Rfl: 3  Current  Facility-Administered Medications:  .  betamethasone acetate-betamethasone sodium phosphate (CELESTONE) injection 3 mg, 3 mg, Intramuscular, Once, Evans, Brent M, DPM .  betamethasone acetate-betamethasone sodium phosphate (CELESTONE) injection 3 mg, 3 mg, Intramuscular, Once, Evans, Brent M, DPM .  betamethasone acetate-betamethasone sodium phosphate (CELESTONE) injection 3 mg, 3 mg, Intramuscular, Once, Edrick Kins, DPM   Observations/Objective: There were no vitals taken for this visit.  Physical Exam  Physical Exam Constitutional:      General: The patient is not in acute distress. Pulmonary:     Effort: Pulmonary effort is normal. No respiratory distress.  Neurological:     Mental Status: The patient is alert and oriented to person, place, and time.  Psychiatric:        Mood and Affect: Mood normal.        Behavior: Behavior normal.   Assessment and Plan  In discussion with patient  Trigeminal neuralgia or glossopharyngeal  Neuralgia are a distinct possibility. Given tounge involvement glossopharangeal nerve may be most involved.   Started on TCA... will take tonight. On low dose may  nbee to increase dose.  If this does not help we can try carbamate for pain.  Pt does not wish to return to neuro or ENT at this time.   I discussed the assessment and treatment plan with the patient. The patient was provided an opportunity to ask questions and all were answered. The patient agreed with the plan and demonstrated an understanding of the instructions.   The patient was advised to call back or seek an in-person evaluation if the symptoms worsen or if the condition fails to improve as anticipated.     Eliezer Lofts, MD

## 2019-11-07 ENCOUNTER — Telehealth: Payer: Self-pay | Admitting: Family Medicine

## 2019-11-07 NOTE — Telephone Encounter (Signed)
Patient received both covid vaccines   [employer]  1st 10/04/19 2nd-10/1819

## 2019-11-07 NOTE — Telephone Encounter (Signed)
Chart updated

## 2019-11-16 DIAGNOSIS — L820 Inflamed seborrheic keratosis: Secondary | ICD-10-CM | POA: Diagnosis not present

## 2019-11-16 DIAGNOSIS — L538 Other specified erythematous conditions: Secondary | ICD-10-CM | POA: Diagnosis not present

## 2019-11-16 DIAGNOSIS — L659 Nonscarring hair loss, unspecified: Secondary | ICD-10-CM | POA: Diagnosis not present

## 2019-11-17 ENCOUNTER — Other Ambulatory Visit: Payer: Self-pay | Admitting: Family Medicine

## 2019-11-19 DIAGNOSIS — L659 Nonscarring hair loss, unspecified: Secondary | ICD-10-CM | POA: Diagnosis not present

## 2019-11-19 NOTE — Telephone Encounter (Signed)
Please try and schedule MWV with nurse and CPE with Dr. Bedsole. 

## 2019-11-20 NOTE — Telephone Encounter (Signed)
Left message for patient to call the office. Need to schedule awv & labs 

## 2019-12-10 DIAGNOSIS — H401131 Primary open-angle glaucoma, bilateral, mild stage: Secondary | ICD-10-CM | POA: Diagnosis not present

## 2019-12-14 ENCOUNTER — Other Ambulatory Visit (INDEPENDENT_AMBULATORY_CARE_PROVIDER_SITE_OTHER): Payer: Medicare HMO

## 2019-12-14 ENCOUNTER — Other Ambulatory Visit: Payer: Self-pay

## 2019-12-14 DIAGNOSIS — E1121 Type 2 diabetes mellitus with diabetic nephropathy: Secondary | ICD-10-CM | POA: Diagnosis not present

## 2019-12-14 DIAGNOSIS — N1831 Chronic kidney disease, stage 3a: Secondary | ICD-10-CM

## 2019-12-14 DIAGNOSIS — R7989 Other specified abnormal findings of blood chemistry: Secondary | ICD-10-CM | POA: Diagnosis not present

## 2019-12-14 LAB — COMPREHENSIVE METABOLIC PANEL
ALT: 15 U/L (ref 0–35)
AST: 15 U/L (ref 0–37)
Albumin: 4.2 g/dL (ref 3.5–5.2)
Alkaline Phosphatase: 57 U/L (ref 39–117)
BUN: 19 mg/dL (ref 6–23)
CO2: 32 mEq/L (ref 19–32)
Calcium: 10.1 mg/dL (ref 8.4–10.5)
Chloride: 98 mEq/L (ref 96–112)
Creatinine, Ser: 1.14 mg/dL (ref 0.40–1.20)
GFR: 46.64 mL/min — ABNORMAL LOW (ref >60.00)
Glucose, Bld: 140 mg/dL — ABNORMAL HIGH (ref 70–99)
Potassium: 4.3 mEq/L (ref 3.5–5.1)
Sodium: 133 mEq/L — ABNORMAL LOW (ref 135–145)
Total Bilirubin: 0.4 mg/dL (ref 0.2–1.2)
Total Protein: 7.2 g/dL (ref 6.0–8.3)

## 2019-12-14 LAB — LDL CHOLESTEROL, DIRECT: Direct LDL: 154 mg/dL

## 2019-12-14 LAB — T3, FREE: T3, Free: 3.3 pg/mL (ref 2.3–4.2)

## 2019-12-14 LAB — LIPID PANEL
Cholesterol: 251 mg/dL — ABNORMAL HIGH (ref 0–200)
HDL: 55.9 mg/dL (ref >39.00)
NonHDL: 194.86
Total CHOL/HDL Ratio: 4
Triglycerides: 239 mg/dL — ABNORMAL HIGH (ref 0.0–149.0)
VLDL: 47.8 mg/dL — ABNORMAL HIGH (ref 0.0–40.0)

## 2019-12-14 LAB — TSH: TSH: 4.39 u[IU]/mL (ref 0.35–4.50)

## 2019-12-14 LAB — T4, FREE: Free T4: 0.76 ng/dL (ref 0.60–1.60)

## 2019-12-14 LAB — HEMOGLOBIN A1C: Hgb A1c MFr Bld: 7 % — ABNORMAL HIGH (ref 4.6–6.5)

## 2019-12-14 NOTE — Progress Notes (Signed)
No critical labs need to be addressed urgently. We will discuss labs in detail at upcoming office visit.   

## 2019-12-18 ENCOUNTER — Ambulatory Visit (INDEPENDENT_AMBULATORY_CARE_PROVIDER_SITE_OTHER): Payer: Medicare HMO

## 2019-12-18 DIAGNOSIS — Z0000 Encounter for general adult medical examination without abnormal findings: Secondary | ICD-10-CM | POA: Diagnosis not present

## 2019-12-18 NOTE — Progress Notes (Signed)
PCP notes:  Health Maintenance: Tdap- insurance/financial   Abnormal Screenings: none   Patient concerns: Diarrhea, ongoing stomach issues   Nurse concerns: none   Next PCP appt.: 12/21/2019 @ 2 pm

## 2019-12-18 NOTE — Progress Notes (Signed)
Subjective:   Cathy Mullins is a 74 y.o. female who presents for Medicare Annual (Subsequent) preventive examination.  Review of Systems: N/A   This visit is being conducted through telemedicine via telephone at the nurse health advisor's home address due to the COVID-19 pandemic. This patient has given me verbal consent via doximity to conduct this visit, patient states they are participating from their home address. Patient and myself are on the telephone call. There is no referral for this visit. Some vital signs may be absent or patient reported.    Patient identification: identified by name, DOB, and current address   Cardiac Risk Factors include: advanced age (>24men, >22 women);diabetes mellitus;hypertension;Other (see comment), Risk factor comments: hypercholesterolemia     Objective:     Vitals: There were no vitals taken for this visit.  There is no height or weight on file to calculate BMI.  Advanced Directives 12/18/2019 02/14/2018 02/04/2017 06/04/2016  Does Patient Have a Medical Advance Directive? Yes Yes Yes Yes  Type of Paramedic of [place];Living will [place];Living will [place];Living will [place] in Chart? No - copy requested No - copy requested No - copy requested -    Tobacco Social History   Tobacco Use  Smoking Status Former Smoker  . Quit date: 09/27/1988  . Years since quitting: 31.2  Smokeless Tobacco Never Used     Counseling given: Not Answered   Clinical Intake:  Pre-visit preparation completed: Yes  Pain : 0-10 Pain Score: 5  Pain Type: Chronic pain Pain Location: (all over body) Pain Descriptors / Indicators: Aching Pain Onset: More than a month ago Pain Frequency: Intermittent     Nutritional Risks: Nausea/ vomitting/ diarrhea(diarrhea off and on) Diabetes: Yes CBG done?: No Did pt. bring in CBG monitor from  home?: No  How often do you need to have someone help you when you read instructions, pamphlets, or other written materials from your doctor or pharmacy?: 1 - Never What is the last grade level you completed in school?: some college  Interpreter Needed?: No  Information entered by :: CJohnson, LPN  Past Medical History:  Diagnosis Date  . Allergy   . Asthma with COPD ([place])   . Depression    daughter was very sick, depression began during this period of time   . Diabetes mellitus   . Esophagitis   . Fibromyalgia   . GERD (gastroesophageal reflux disease)   . Glaucoma   . History of colon polyps   . HTN (hypertension)   . Hyperlipidemia   . Lichen sclerosus   . Migraine   . Perforating folliculitis   . Spinal stenosis   . Tachycardia   . TIA (transient ischemic attack)    Past Surgical History:  Procedure Laterality Date  . ABDOMINAL HYSTERECTOMY     right ovary remaining  . APPENDECTOMY    . BREAST CYST ASPIRATION Bilateral   . BREAST EXCISIONAL BIOPSY Right 1982   NEG  . BREAST SURGERY     right(benign)  . CARPAL TUNNEL RELEASE    . CESAREAN SECTION    . EPIDURAL BLOCK INJECTION  2013  . KNEE ARTHROSCOPY W/ MENISCAL REPAIR     left  . LIPOMA EXCISION     2 times left leg  . lumpectomy    . TONSILLECTOMY    . TRIGGER FINGER RELEASE     Family History  Problem Relation Age of Onset  . Cancer Mother        breast  . Stroke Mother   . Valvular heart disease Mother   . Breast cancer Mother 69  . Alzheimer's disease Mother   . Kidney disease Father   . Stroke Father   . Coronary artery disease Father   . Parkinson's disease Father   . Diabetes Brother   . Alzheimer's disease Maternal Grandmother   . Diabetes Maternal Aunt   . Alzheimer's disease Maternal Aunt   . Alzheimer's disease Maternal Aunt   . Colon cancer Neg Hx   . Pancreatic cancer Neg Hx   . Stomach cancer Neg Hx    Social History   Socioeconomic History  . Marital status: Married     Spouse name: Not on file  . Number of children: 1  . Years of education: Not on file  . Highest education level: Some college, no degree  Occupational History  . Occupation: retired Marketing executive)    Fish farm manager: retired  Tobacco Use  . Smoking status: Former Smoker    Quit date: 09/27/1988    Years since quitting: 31.2  . Smokeless tobacco: Never Used  Substance and Sexual Activity  . Alcohol use: No    Alcohol/week: 0.0 standard drinks  . Drug use: No  . Sexual activity: Not on file  Other Topics Concern  . Not on file  Social History Narrative   Regular exercise--no   Diet: fruits and veggies, calcium, water, avoids fast food   Right handed   Caffeine use: 1 quart to 1/2 gallon of tea daily                  Social Determinants of Health   Financial Resource Strain: Low Risk   . Difficulty of Paying Living Expenses: Not hard at all  Food Insecurity: No Food Insecurity  . Worried About Charity fundraiser in the Last Year: Never true  . Ran Out of Food in the Last Year: Never true  Transportation Needs: No Transportation Needs  . Lack of Transportation (Medical): No  . Lack of Transportation (Non-Medical): No  Physical Activity: Insufficiently Active  . Days of Exercise per Week: 3 days  . Minutes of Exercise per Session: 30 min  Stress: No Stress Concern Present  . Feeling of Stress : Not at all  Social Connections:   . Frequency of Communication with Friends and Family:   . Frequency of Social Gatherings with Friends and Family:   . Attends Religious Services:   . Active Member of Clubs or Organizations:   . Attends Archivist Meetings:   Marland Kitchen Marital Status:     Outpatient Encounter Medications as of 12/18/2019  Medication Sig  . ACCU-CHEK SOFTCLIX LANCETS lancets Use to check blood sugar two times a day.  Dx:250.00 Non-Insulin  . albuterol (PROVENTIL HFA;VENTOLIN HFA) 108 (90 BASE) MCG/ACT inhaler 2 puffs every 4 hours as needed for wheezing.  Marland Kitchen aspirin  81 MG tablet Take 81 mg by mouth as needed.  . Blood Glucose Monitoring Suppl (ACCU-CHEK AVIVA) device Use to check blood sugars two times a day.  Dx:250.00 Non-Insulin  . Cholecalciferol (VITAMIN D3 PO) Take 1,000 Units by mouth daily.  . clobetasol ointment (TEMOVATE) 0.05 % Apply topically 2 (two) times daily. Use 2 to 3 times weekly  . COLCRYS 0.6 MG tablet TAKE ONE TABLET BY MOUTH ONCE DAILY  . desipramine (NORPRAMIN) 25 MG tablet Take 1 tablet (  25 mg total) by mouth at bedtime.  . diclofenac sodium (VOLTAREN) 1 % GEL APPLY 2 GRAMS TOPICALLY FOUR TIMES A DAY AS NEEDED  . glucose blood (ACCU-CHEK AVIVA PLUS) test strip Use to check blood sugar two times a day.  Dx:250.00 Non-Insulin  . Homeopathic Products (T-RELIEF EXTRA STRENGTH) CREA Apply topically.  . hydrochlorothiazide (HYDRODIURIL) 25 MG tablet TAKE 1 TABLET (25 MG TOTAL) BY MOUTH DAILY.  Marland Kitchen latanoprost (XALATAN) 0.005 % ophthalmic solution Place 1 drop into both eyes at bedtime.  . Magnesium 200 MG TABS Take by mouth.  . metFORMIN (GLUCOPHAGE-XR) 500 MG 24 hr tablet TAKE 1 TABLET TWICE DAILY  . metoprolol tartrate (LOPRESSOR) 50 MG tablet TAKE 1 TABLET TWICE DAILY  . Multiple Vitamins-Minerals (CENTRUM ADULTS PO) Take 1 tablet by mouth daily.  . mupirocin ointment (BACTROBAN) 2 % Apply 1 application topically 2 (two) times daily.  Marland Kitchen neomycin-bacitracin-polymyxin (NEOSPORIN) ointment Apply 1 application topically as needed for wound care.   . timolol (TIMOPTIC) 0.5 % ophthalmic solution Place 1 drop into both eyes daily.   . traMADol (ULTRAM) 50 MG tablet Take 1 tablet (50 mg total) by mouth 4 (four) times daily.   Facility-Administered Encounter Medications as of 12/18/2019  Medication  . betamethasone acetate-betamethasone sodium phosphate (CELESTONE) injection 3 mg  . betamethasone acetate-betamethasone sodium phosphate (CELESTONE) injection 3 mg  . betamethasone acetate-betamethasone sodium phosphate (CELESTONE) injection 3 mg      Activities of Daily Living In your present state of health, do you have any difficulty performing the following activities: 12/18/2019  Hearing? N  Vision? N  Difficulty concentrating or making decisions? N  Walking or climbing stairs? N  Dressing or bathing? N  Doing errands, shopping? N  Preparing Food and eating ? N  Using the Toilet? N  In the past six months, have you accidently leaked urine? N  Do you have problems with loss of bowel control? N  Managing your Medications? N  Managing your Finances? N  Housekeeping or managing your Housekeeping? N  Some recent data might be hidden    Patient Care Team: Jinny Sanders, MD as PCP - General    Assessment:   This is a routine wellness examination for Sariya.  Exercise Activities and Dietary recommendations Current Exercise Habits: Home exercise routine, Type of exercise: Other - see comments(dancing), Time (Minutes): 30, Frequency (Times/Week): 3, Weekly Exercise (Minutes/Week): 90, Intensity: Moderate, Exercise limited by: None identified  Goals    . Increase physical activity     Starting 02/04/2017, I will continue to do stretching exercises for at least 10 min daily.     . Patient Stated     Starting 02/14/2018, I will continue to take medications as prescribed.     . Patient Stated     12/18/2019, I will maintain and continue medications prescribed.        Fall Risk Fall Risk  12/18/2019 02/16/2018 02/14/2018 02/04/2017 12/10/2015  Falls in the past year? 0 No No Yes No  Comment - - - pt lost balance and fell on front porch -  Number falls in past yr: 0 - - 1 -  Injury with Fall? 0 - - No -  Risk for fall due to : Medication side effect - - - -  Follow up Falls evaluation completed;Falls prevention discussed - - - -   Is the patient's home free of loose throw rugs in walkways, pet beds, electrical cords, etc?   yes  Grab bars in the bathroom? yes      Handrails on the stairs?   yes      Adequate lighting?    yes  Timed Get Up and Go performed: N/A  Depression Screen PHQ 2/9 Scores 12/18/2019 02/16/2018 02/14/2018 02/04/2017  PHQ - 2 Score 0 0 0 0  PHQ- 9 Score 0 - 0 -     Cognitive Function MMSE - Mini Mental State Exam 12/18/2019 02/14/2018 02/04/2017  Orientation to time 5 5 5   Orientation to Place 5 5 5   Registration 3 3 3   Attention/ Calculation 5 0 0  Recall 3 3 3   Language- name 2 objects - 0 0  Language- repeat 1 1 1   Language- follow 3 step command - 3 3  Language- read & follow direction - 0 0  Write a sentence - 0 0  Copy design - 0 0  Total score - 20 20  Mini Cog  Mini-Cog screen was completed. Maximum score is 22. A value of 0 denotes this part of the MMSE was not completed or the patient failed this part of the Mini-Cog screening.       Immunization History  Administered Date(s) Administered  . Fluad Quad(high Dose 65+) 07/26/2019  . Influenza Split 06/13/2012  . Influenza Whole 08/20/2003, 05/30/2007, 05/30/2008, 06/06/2009, 06/17/2010  . Influenza,inj,Quad PF,6+ Mos 06/29/2013, 07/09/2014, 04/25/2015, 05/20/2016, 05/13/2017, 06/29/2018  . PFIZER SARS-COV-2 Vaccination 10/04/2019, 10/25/2019  . Pneumococcal Conjugate-13 07/09/2014  . Pneumococcal Polysaccharide-23 06/04/2011  . Td 03/09/2004    Qualifies for Shingles Vaccine: yes  Screening Tests Health Maintenance  Topic Date Due  . URINE MICROALBUMIN  07/18/2011  . FOOT EXAM  02/17/2019  . TETANUS/TDAP  03/08/2024 (Originally 03/09/2014)  . INFLUENZA VACCINE  03/09/2020  . HEMOGLOBIN A1C  06/15/2020  . OPHTHALMOLOGY EXAM  06/18/2020  . MAMMOGRAM  08/14/2020  . COLONOSCOPY  09/12/2023  . DEXA SCAN  Completed  . COVID-19 Vaccine  Completed  . Hepatitis C Screening  Completed  . PNA vac Low Risk Adult  Completed    Cancer Screenings: Lung: Low Dose CT Chest recommended if Age 79-80 years, 30 pack-year currently smoking OR have quit w/in 15 years. Patient does not qualify. Breast:  Up to date on  Mammogram: Yes, completed 08/15/2019   Bone Density/Dexa: completed 12/15/2015 Colorectal: completed 09/11/2013  Additional Screenings:  Hepatitis C Screening: 10/23/2015     Plan:   Patient will maintain and continue medications as prescribed.   I have personally reviewed and noted the following in the patient's chart:   . Medical and social history . Use of alcohol, tobacco or illicit drugs  . Current medications and supplements . Functional ability and status . Nutritional status . Physical activity . Advanced directives . List of other physicians . Hospitalizations, surgeries, and ER visits in previous 12 months . Vitals . Screenings to include cognitive, depression, and falls . Referrals and appointments  In addition, I have reviewed and discussed with patient certain preventive protocols, quality metrics, and best practice recommendations. A written personalized care plan for preventive services as well as general preventive health recommendations were provided to patient.     Andrez Grime, LPN  [id number]

## 2019-12-18 NOTE — Patient Instructions (Signed)
Cathy Mullins , Thank you for taking time to come for your Medicare Wellness Visit. I appreciate your ongoing commitment to your health goals. Please review the following plan we discussed and let me know if I can assist you in the future.   Screening recommendations/referrals: Colonoscopy: Up to date, completed 09/11/2013 Mammogram: Up to date, completed 08/15/2019 Bone Density: completed 12/15/2015 Recommended yearly ophthalmology/optometry visit for glaucoma screening and checkup Recommended yearly dental visit for hygiene and checkup  Vaccinations: Influenza vaccine: Up to date, completed 07/26/2019 Pneumococcal vaccine: Completed series Tdap vaccine: decline Shingles vaccine: discussed    Advanced directives: Please bring a copy of your POA (Power of Attorney) and/or Living Will to your next appointment.   Conditions/risks identified: diabetes, hypertension, hypercholesterolemia  Next appointment: 12/21/2019 @ 2 pm    Preventive Care 2 Years and Older, Female Preventive care refers to lifestyle choices and visits with your health care provider that can promote health and wellness. What does preventive care include?  A yearly physical exam. This is also called an annual well check.  Dental exams once or twice a year.  Routine eye exams. Ask your health care provider how often you should have your eyes checked.  Personal lifestyle choices, including:  Daily care of your teeth and gums.  Regular physical activity.  Eating a healthy diet.  Avoiding tobacco and drug use.  Limiting alcohol use.  Practicing safe sex.  Taking low-dose aspirin every day.  Taking vitamin and mineral supplements as recommended by your health care provider. What happens during an annual well check? The services and screenings done by your health care provider during your annual well check will depend on your age, overall health, lifestyle risk factors, and family history of disease. Counseling    Your health care provider may ask you questions about your:  Alcohol use.  Tobacco use.  Drug use.  Emotional well-being.  Home and relationship well-being.  Sexual activity.  Eating habits.  History of falls.  Memory and ability to understand (cognition).  Work and work Statistician.  Reproductive health. Screening  You may have the following tests or measurements:  Height, weight, and BMI.  Blood pressure.  Lipid and cholesterol levels. These may be checked every 5 years, or more frequently if you are over 62 years old.  Skin check.  Lung cancer screening. You may have this screening every year starting at age 24 if you have a 30-pack-year history of smoking and currently smoke or have quit within the past 15 years.  Fecal occult blood test (FOBT) of the stool. You may have this test every year starting at age 23.  Flexible sigmoidoscopy or colonoscopy. You may have a sigmoidoscopy every 5 years or a colonoscopy every 10 years starting at age 62.  Hepatitis C blood test.  Hepatitis B blood test.  Sexually transmitted disease (STD) testing.  Diabetes screening. This is done by checking your blood sugar (glucose) after you have not eaten for a while (fasting). You may have this done every 1-3 years.  Bone density scan. This is done to screen for osteoporosis. You may have this done starting at age 70.  Mammogram. This may be done every 1-2 years. Talk to your health care provider about how often you should have regular mammograms. Talk with your health care provider about your test results, treatment options, and if necessary, the need for more tests. Vaccines  Your health care provider may recommend certain vaccines, such as:  Influenza vaccine. This is  recommended every year.  Tetanus, diphtheria, and acellular pertussis (Tdap, Td) vaccine. You may need a Td booster every 10 years.  Zoster vaccine. You may need this after age 55.  Pneumococcal 13-valent  conjugate (PCV13) vaccine. One dose is recommended after age 58.  Pneumococcal polysaccharide (PPSV23) vaccine. One dose is recommended after age 59. Talk to your health care provider about which screenings and vaccines you need and how often you need them. This information is not intended to replace advice given to you by your health care provider. Make sure you discuss any questions you have with your health care provider. Document Released: 08/22/2015 Document Revised: 04/14/2016 Document Reviewed: 05/27/2015 Elsevier Interactive Patient Education  2017 [place] Prevention in the Home Falls can cause injuries. They can happen to people of all ages. There are many things you can do to make your home safe and to help prevent falls. What can I do on the outside of my home?  Regularly fix the edges of walkways and driveways and fix any cracks.  Remove anything that might make you trip as you walk through a door, such as a raised step or threshold.  Trim any bushes or trees on the path to your home.  Use bright outdoor lighting.  Clear any walking paths of anything that might make someone trip, such as rocks or tools.  Regularly check to see if handrails are loose or broken. Make sure that both sides of any steps have handrails.  Any raised decks and porches should have guardrails on the edges.  Have any leaves, snow, or ice cleared regularly.  Use sand or salt on walking paths during winter.  Clean up any spills in your garage right away. This includes oil or grease spills. What can I do in the bathroom?  Use night lights.  Install grab bars by the toilet and in the tub and shower. Do not use towel bars as grab bars.  Use non-skid mats or decals in the tub or shower.  If you need to sit down in the shower, use a plastic, non-slip stool.  Keep the floor dry. Clean up any water that spills on the floor as soon as it happens.  Remove soap buildup in the tub or  shower regularly.  Attach bath mats securely with double-sided non-slip rug tape.  Do not have throw rugs and other things on the floor that can make you trip. What can I do in the bedroom?  Use night lights.  Make sure that you have a light by your bed that is easy to reach.  Do not use any sheets or blankets that are too big for your bed. They should not hang down onto the floor.  Have a firm chair that has side arms. You can use this for support while you get dressed.  Do not have throw rugs and other things on the floor that can make you trip. What can I do in the kitchen?  Clean up any spills right away.  Avoid walking on wet floors.  Keep items that you use a lot in easy-to-reach places.  If you need to reach something above you, use a strong step stool that has a grab bar.  Keep electrical cords out of the way.  Do not use floor polish or wax that makes floors slippery. If you must use wax, use non-skid floor wax.  Do not have throw rugs and other things on the floor that can make you trip.  What can I do with my stairs?  Do not leave any items on the stairs.  Make sure that there are handrails on both sides of the stairs and use them. Fix handrails that are broken or loose. Make sure that handrails are as long as the stairways.  Check any carpeting to make sure that it is firmly attached to the stairs. Fix any carpet that is loose or worn.  Avoid having throw rugs at the top or bottom of the stairs. If you do have throw rugs, attach them to the floor with carpet tape.  Make sure that you have a light switch at the top of the stairs and the bottom of the stairs. If you do not have them, ask someone to add them for you. What else can I do to help prevent falls?  Wear shoes that:  Do not have high heels.  Have rubber bottoms.  Are comfortable and fit you well.  Are closed at the toe. Do not wear sandals.  If you use a stepladder:  Make sure that it is fully  opened. Do not climb a closed stepladder.  Make sure that both sides of the stepladder are locked into place.  Ask someone to hold it for you, if possible.  Clearly mark and make sure that you can see:  Any grab bars or handrails.  First and last steps.  Where the edge of each step is.  Use tools that help you move around (mobility aids) if they are needed. These include:  Canes.  Walkers.  Scooters.  Crutches.  Turn on the lights when you go into a dark area. Replace any light bulbs as soon as they burn out.  Set up your furniture so you have a clear path. Avoid moving your furniture around.  If any of your floors are uneven, fix them.  If there are any pets around you, be aware of where they are.  Review your medicines with your doctor. Some medicines can make you feel dizzy. This can increase your chance of falling. Ask your doctor what other things that you can do to help prevent falls. This information is not intended to replace advice given to you by your health care provider. Make sure you discuss any questions you have with your health care provider. Document Released: 05/22/2009 Document Revised: 01/01/2016 Document Reviewed: 08/30/2014 Elsevier Interactive Patient Education  2017 [employer].

## 2019-12-21 ENCOUNTER — Ambulatory Visit (INDEPENDENT_AMBULATORY_CARE_PROVIDER_SITE_OTHER): Payer: Medicare HMO | Admitting: Family Medicine

## 2019-12-21 ENCOUNTER — Encounter: Payer: Self-pay | Admitting: Family Medicine

## 2019-12-21 ENCOUNTER — Other Ambulatory Visit: Payer: Self-pay

## 2019-12-21 VITALS — BP 130/72 | HR 75 | Temp 98.5°F | Ht 61.5 in | Wt 170.5 lb

## 2019-12-21 DIAGNOSIS — R195 Other fecal abnormalities: Secondary | ICD-10-CM

## 2019-12-21 DIAGNOSIS — E871 Hypo-osmolality and hyponatremia: Secondary | ICD-10-CM

## 2019-12-21 DIAGNOSIS — E1122 Type 2 diabetes mellitus with diabetic chronic kidney disease: Secondary | ICD-10-CM

## 2019-12-21 DIAGNOSIS — E1129 Type 2 diabetes mellitus with other diabetic kidney complication: Secondary | ICD-10-CM | POA: Diagnosis not present

## 2019-12-21 DIAGNOSIS — M797 Fibromyalgia: Secondary | ICD-10-CM

## 2019-12-21 DIAGNOSIS — N1831 Chronic kidney disease, stage 3a: Secondary | ICD-10-CM

## 2019-12-21 DIAGNOSIS — Z0000 Encounter for general adult medical examination without abnormal findings: Secondary | ICD-10-CM

## 2019-12-21 DIAGNOSIS — E1159 Type 2 diabetes mellitus with other circulatory complications: Secondary | ICD-10-CM | POA: Diagnosis not present

## 2019-12-21 DIAGNOSIS — N183 Chronic kidney disease, stage 3 unspecified: Secondary | ICD-10-CM | POA: Diagnosis not present

## 2019-12-21 DIAGNOSIS — E1121 Type 2 diabetes mellitus with diabetic nephropathy: Secondary | ICD-10-CM

## 2019-12-21 LAB — HM DIABETES FOOT EXAM

## 2019-12-21 NOTE — Assessment & Plan Note (Signed)
Liekly due to HCTZ.. will follow after no further restriction sodium.

## 2019-12-21 NOTE — Progress Notes (Signed)
Chief Complaint  Patient presents with  . Annual Exam    Part 2    History of Present Illness: HPI  The patient presents for complete physical and review of chronic health problems. He/She also has the following acute concerns today: diarrhea and chronic stomach issues nausea has now resolved.   Saw Dr,. Wohl.. felt not GI but now she has diarrhea. Stools are darker in color.. she is on iron but stool was darker  Last colonoscopy 2015  Also recurrent sharp pain in face/ear/tounge for years, ? glossopharyngeal neuralgia.. prescribed desipramine. This has resolved completely.  Dizziness is resolve   Fibromyalgia is improved overall.  The patient saw a LPN or RN for medicare wellness visit.  Prevention and wellness was reviewed in detail. Note reviewed and important notes copied below.   Health Maintenance: Tdap- insurance/financial Abnormal Screenings: none Patient concerns: Diarrhea, ongoing stomach issues  12/21/19  Diabetes:  Stable control on current regimen. Associated With renal insufficiency. Estimated Creatinine Clearance: 41.8 mL/min (by C-G formula based on SCr of 1.14 mg/dL).  Lab Results  Component Value Date   HGBA1C 7.0 (H) 12/14/2019  Using medications without difficulties: Hypoglycemic episodes: Hyperglycemic episodes: Feet problems: Blood Sugars averaging: eye exam within last year:  Elevated Cholesterol:  Poor control refuses statin despite increased risk CAD.SE to statins in past The 10-year ASCVD risk score Mikey Bussing DC Brooke Bonito., et al., 2013) is: 32.4%   Values used to calculate the score:     Age: 74 years     Sex: Female     Is Non-Hispanic African American: No     Diabetic: Yes     Tobacco smoker: No     Systolic Blood Pressure: [id number] mmHg     Is BP treated: Yes     HDL Cholesterol: 55.9 mg/dL     Total Cholesterol: 251 mg/dL  Lab Results  Component Value Date   CHOL 251 (H) 12/14/2019   HDL 55.90 12/14/2019   LDLCALC 130 (H)  02/04/2017   LDLDIRECT 154.0 12/14/2019   TRIG 239.0 (H) 12/14/2019   CHOLHDL 4 12/14/2019  Using medications without problems: Muscle aches:  Diet compliance: Exercise: Other complaints:   Low sodium likely due to HCTZ.  This visit occurred during the SARS-CoV-2 public health emergency.  Safety protocols were in place, including screening questions prior to the visit, additional usage of staff PPE, and extensive cleaning of exam room while observing appropriate contact time as indicated for disinfecting solutions.   COVID 19 screen:  No recent travel or known exposure to COVID19 The patient denies respiratory symptoms of COVID 19 at this time. The importance of social distancing was discussed today.     Review of Systems  Constitutional: Negative for chills and fever.  HENT: Negative for congestion and ear pain.   Eyes: Negative for pain and redness.  Respiratory: Negative for cough and shortness of breath.   Cardiovascular: Negative for chest pain, palpitations and leg swelling.  Gastrointestinal: Positive for diarrhea and melena. Negative for abdominal pain, blood in stool, constipation, nausea and vomiting.  Genitourinary: Negative for dysuria.  Musculoskeletal: Negative for falls and myalgias.  Skin: Negative for rash.  Neurological: Negative for dizziness.  Psychiatric/Behavioral: Negative for depression. The patient is not nervous/anxious.       Past Medical History:  Diagnosis Date  . Allergy   . Asthma with COPD ([place])   . Depression    daughter was very sick, depression began during this period of time   .  Diabetes mellitus   . Esophagitis   . Fibromyalgia   . GERD (gastroesophageal reflux disease)   . Glaucoma   . History of colon polyps   . HTN (hypertension)   . Hyperlipidemia   . Lichen sclerosus   . Migraine   . Perforating folliculitis   . Spinal stenosis   . Tachycardia   . TIA (transient ischemic attack)     reports that she quit smoking about  31 years ago. She has never used smokeless tobacco. She reports that she does not drink alcohol or use drugs.   Current Outpatient Medications:  .  ACCU-CHEK SOFTCLIX LANCETS lancets, Use to check blood sugar two times a day.  Dx:250.00 Non-Insulin, Disp: 200 each, Rfl: 3 .  albuterol (PROVENTIL HFA;VENTOLIN HFA) 108 (90 BASE) MCG/ACT inhaler, 2 puffs every 4 hours as needed for wheezing., Disp: 1 Inhaler, Rfl: 0 .  aspirin 81 MG tablet, Take 81 mg by mouth as needed., Disp: , Rfl:  .  Blood Glucose Monitoring Suppl (ACCU-CHEK AVIVA) device, Use to check blood sugars two times a day.  Dx:250.00 Non-Insulin, Disp: 1 each, Rfl: 0 .  Cholecalciferol (VITAMIN D3 PO), Take 1,000 Units by mouth daily., Disp: , Rfl:  .  clobetasol ointment (TEMOVATE) 0.05 %, Apply topically 2 (two) times daily. Use 2 to 3 times weekly, Disp: 30 g, Rfl: 0 .  COLCRYS 0.6 MG tablet, TAKE ONE TABLET BY MOUTH ONCE DAILY, Disp: 30 tablet, Rfl: 5 .  desipramine (NORPRAMIN) 25 MG tablet, Take 1 tablet (25 mg total) by mouth at bedtime., Disp: 90 tablet, Rfl: 1 .  diclofenac sodium (VOLTAREN) 1 % GEL, APPLY 2 GRAMS TOPICALLY FOUR TIMES A DAY AS NEEDED, Disp: 300 g, Rfl: 1 .  ferrous sulfate 325 (65 FE) MG tablet, Take 325 mg by mouth daily with breakfast., Disp: , Rfl:  .  glucose blood (ACCU-CHEK AVIVA PLUS) test strip, Use to check blood sugar two times a day.  Dx:250.00 Non-Insulin, Disp: 200 each, Rfl: 3 .  Homeopathic Products (T-RELIEF EXTRA STRENGTH) CREA, Apply topically., Disp: , Rfl:  .  hydrochlorothiazide (HYDRODIURIL) 25 MG tablet, TAKE 1 TABLET (25 MG TOTAL) BY MOUTH DAILY., Disp: 90 tablet, Rfl: 0 .  latanoprost (XALATAN) 0.005 % ophthalmic solution, Place 1 drop into both eyes at bedtime., Disp: , Rfl:  .  Magnesium 200 MG TABS, Take by mouth., Disp: , Rfl:  .  metFORMIN (GLUCOPHAGE-XR) 500 MG 24 hr tablet, TAKE 1 TABLET TWICE DAILY, Disp: 180 tablet, Rfl: 2 .  metoprolol tartrate (LOPRESSOR) 50 MG tablet, TAKE 1  TABLET TWICE DAILY, Disp: 180 tablet, Rfl: 1 .  Multiple Vitamins-Minerals (CENTRUM ADULTS PO), Take 1 tablet by mouth daily., Disp: , Rfl:  .  mupirocin ointment (BACTROBAN) 2 %, Apply 1 application topically 2 (two) times daily., Disp: 22 g, Rfl: 0 .  neomycin-bacitracin-polymyxin (NEOSPORIN) ointment, Apply 1 application topically as needed for wound care. , Disp: , Rfl:  .  timolol (TIMOPTIC) 0.5 % ophthalmic solution, Place 1 drop into both eyes daily. , Disp: , Rfl:  .  traMADol (ULTRAM) 50 MG tablet, Take 1 tablet (50 mg total) by mouth 4 (four) times daily., Disp: 120 tablet, Rfl: 3  Current Facility-Administered Medications:  .  betamethasone acetate-betamethasone sodium phosphate (CELESTONE) injection 3 mg, 3 mg, Intramuscular, Once, Evans, Brent M, DPM .  betamethasone acetate-betamethasone sodium phosphate (CELESTONE) injection 3 mg, 3 mg, Intramuscular, Once, Evans, Brent M, DPM .  betamethasone acetate-betamethasone sodium phosphate (CELESTONE) injection  3 mg, 3 mg, Intramuscular, Once, Evans, Dorathy Daft, DPM   Observations/Objective: Blood pressure 130/72, pulse 75, temperature 98.5 F (36.9 C), temperature source Temporal, height 5' 1.5" (1.562 m), weight 170 lb 8 oz (77.3 kg), SpO2 97 %.  Physical Exam Constitutional:      General: She is not in acute distress.    Appearance: Normal appearance. She is well-developed. She is not ill-appearing or toxic-appearing.  HENT:     Head: Normocephalic.     Right Ear: Hearing, tympanic membrane, ear canal and external ear normal.     Left Ear: Hearing, tympanic membrane, ear canal and external ear normal.     Nose: Nose normal.  Eyes:     General: Lids are normal. Lids are everted, no foreign bodies appreciated.     Conjunctiva/sclera: Conjunctivae normal.     Pupils: Pupils are equal, round, and reactive to light.  Neck:     Thyroid: No thyroid mass or thyromegaly.     Vascular: No carotid bruit.     Trachea: Trachea normal.   Cardiovascular:     Rate and Rhythm: Normal rate and regular rhythm.     Heart sounds: Normal heart sounds, S1 normal and S2 normal. No murmur. No gallop.   Pulmonary:     Effort: Pulmonary effort is normal. No respiratory distress.     Breath sounds: Normal breath sounds. No wheezing, rhonchi or rales.  Abdominal:     General: Bowel sounds are normal. There is no distension or abdominal bruit.     Palpations: Abdomen is soft. There is no fluid wave or mass.     Tenderness: There is no abdominal tenderness. There is no guarding or rebound.     Hernia: No hernia is present.  Genitourinary:    Rectum: Guaiac result negative. No mass, tenderness, external hemorrhoid or internal hemorrhoid. Normal anal tone.  Musculoskeletal:     Cervical back: Normal range of motion and neck supple.  Lymphadenopathy:     Cervical: No cervical adenopathy.  Skin:    General: Skin is warm and dry.     Findings: No rash.  Neurological:     Mental Status: She is alert.     Cranial Nerves: No cranial nerve deficit.     Sensory: No sensory deficit.  Psychiatric:        Mood and Affect: Mood is not anxious or depressed.        Speech: Speech normal.        Behavior: Behavior normal. Behavior is cooperative.        Judgment: Judgment normal.      Diabetic foot exam: Normal inspection No skin breakdown No calluses  Normal DP pulses Normal sensation to light touch and monofilament Nails normal  Assessment and Plan The patient's preventative maintenance and recommended screening tests for an annual wellness exam were reviewed in full today. Brought up to date unless services declined.  Counselled on the importance of diet, exercise, and its role in overall health and mortality. The patient's FH and SH was reviewed, including their home life, tobacco status, and drug and alcohol status.   DXA: 12/2015 nml, repeat in 5 years Mammo: nml 08/15/19, mother with breast cancer PAP/DVE: not indicated s/p  abdominal hysterectomy, right ovary remaining. Sees Dr. Kennon Rounds for GYN for lichen sclerosis but has not seen in a while. No family history of ovarian cancer. She has chosen to stop DVEs.  Vaccines:Up to date with PNA 13, 23 and planning shingles vaccine,  tdap due.  Has received COVID vaccine Colon cancer screening: colonoscopy Nml,2015. Dr. Deatra Ina, repeat in 10 years.  Hep C: done Nonsmoker  Urine microalbumin: due   Exercise: she is dancing for exercise 3 times a week.  Hyperlipidemia associated with type 2 diabetes mellitus ([place])  Poor control refuses statin despite increased risk CAD.SE to statins in past The 10-year ASCVD risk score Mikey Bussing DC Brooke Bonito., et al., 2013) is: 32.4%   Values used to calculate the score:     Age: 46 years     Sex: Female     Is Non-Hispanic African American: No     Diabetic: Yes     Tobacco smoker: No     Systolic Blood Pressure: [id number] mmHg     Is BP treated: Yes     HDL Cholesterol: 55.9 mg/dL     Total Cholesterol: 251 mg/dL   DM (diabetes mellitus), type 2 with renal complications (HCC) Tolerable control on current regimen.  CKD stage 3 due to type 2 diabetes mellitus (HCC) Due to DM. Stable GFR.  Hyponatremia  [place] due to HCTZ.. will follow after no further restriction sodium.  Dark stools Likely dietary. No blood in stool today.  Fibromyalgia Has possibly improved on desipramine as well.    Eliezer Lofts, MD

## 2019-12-21 NOTE — Assessment & Plan Note (Signed)
Likely dietary. No blood in stool today.

## 2019-12-21 NOTE — Assessment & Plan Note (Signed)
Tolerable control on current regimen.

## 2019-12-21 NOTE — Assessment & Plan Note (Signed)
Due to DM. Stable GFR.

## 2019-12-21 NOTE — Assessment & Plan Note (Signed)
Has possibly improved on desipramine as well.

## 2019-12-21 NOTE — Assessment & Plan Note (Signed)
Poor control refuses statin despite increased risk CAD.SE to statins in past The 10-year ASCVD risk score Mikey Bussing DC Brooke Bonito., et al., 2013) is: 32.4%   Values used to calculate the score:     Age: 74 years     Sex: Female     Is Non-Hispanic African American: No     Diabetic: Yes     Tobacco smoker: No     Systolic Blood Pressure: [id number] mmHg     Is BP treated: Yes     HDL Cholesterol: 55.9 mg/dL     Total Cholesterol: 251 mg/dL

## 2019-12-21 NOTE — Patient Instructions (Addendum)
Do not restrict sodium, add to food some.   Increase fiber.. benefiber  Supplement.

## 2019-12-22 LAB — MICROALBUMIN / CREATININE URINE RATIO
Creatinine, Urine: 174 mg/dL (ref 20–275)
Microalb Creat Ratio: 7 mcg/mg creat (ref <30)
Microalb, Ur: 1.3 mg/dL

## 2020-01-13 ENCOUNTER — Other Ambulatory Visit: Payer: Self-pay | Admitting: Family Medicine

## 2020-01-18 DIAGNOSIS — E119 Type 2 diabetes mellitus without complications: Secondary | ICD-10-CM | POA: Diagnosis not present

## 2020-01-18 DIAGNOSIS — H401131 Primary open-angle glaucoma, bilateral, mild stage: Secondary | ICD-10-CM | POA: Diagnosis not present

## 2020-01-29 ENCOUNTER — Other Ambulatory Visit: Payer: Self-pay | Admitting: Family Medicine

## 2020-01-29 NOTE — Telephone Encounter (Signed)
Patient called today requesting her script to be sent to a local pharmacy   Patient stated she did not realize she only had 2 tablets left of medication Her mail order pharmacy will not get her medication to her for 10-14 day. She was advised to call her pcp to see if a 10-14 day supply could be sent to [facility] [address] so she will have enough until her medication is delivered

## 2020-01-29 NOTE — Telephone Encounter (Signed)
I am sorry, did not realize I left it out  She is needing the Tramadol

## 2020-01-29 NOTE — Telephone Encounter (Signed)
What medication is she needing?

## 2020-01-30 ENCOUNTER — Other Ambulatory Visit: Payer: Self-pay | Admitting: Gastroenterology

## 2020-01-30 ENCOUNTER — Other Ambulatory Visit: Payer: Self-pay | Admitting: *Deleted

## 2020-01-30 MED ORDER — TRAMADOL HCL 50 MG PO TABS: 50.0000 mg | ORAL_TABLET | Freq: Four times a day (QID) | ORAL | 3 refills | Status: DC

## 2020-01-30 MED ORDER — TRAMADOL HCL 50 MG PO TABS: 50.0000 mg | ORAL_TABLET | Freq: Four times a day (QID) | ORAL | 0 refills | Status: DC

## 2020-01-30 NOTE — Telephone Encounter (Signed)
Patient called back and said she has 1 Tramadol remaining.  Patient would like a 10 day supply sent to [facility].

## 2020-01-30 NOTE — Telephone Encounter (Signed)
Last office visit 12/21/2019 for CPE.  Last refilled 08/13/2019 for #120 with 3 refills.  This is for mail order pharmacy.  Patient also needs a 10 day supply sent to local pharmacy.  See separate refill request.  Next Appt: 06/24/2020 for 6 month follow up.

## 2020-01-30 NOTE — Telephone Encounter (Signed)
Left message for Cathy Mullins that Dr. Lorelei Pont sent her in a 10 day supply of her Tramadol to [facility] on [address] as request.

## 2020-01-30 NOTE — Telephone Encounter (Signed)
Dr. Lorelei Pont approved the 10 day supply of Tramadol.  Mail Order refill will need to wait on Dr. Diona Browner for approval.

## 2020-02-01 ENCOUNTER — Other Ambulatory Visit: Payer: Self-pay | Admitting: Family Medicine

## 2020-03-06 DIAGNOSIS — L658 Other specified nonscarring hair loss: Secondary | ICD-10-CM | POA: Diagnosis not present

## 2020-03-16 ENCOUNTER — Other Ambulatory Visit: Payer: Self-pay | Admitting: Family Medicine

## 2020-04-02 ENCOUNTER — Telehealth: Payer: Self-pay | Admitting: Family Medicine

## 2020-04-02 NOTE — Telephone Encounter (Signed)
I have not seen her for this issue before.. lumbar sciatica... she needs an appt so I can gather info, likely X-ray then can proceed with referral.

## 2020-04-02 NOTE — Telephone Encounter (Signed)
Patient called.  Patient has been having sciatic nerve pain.  She said she sat down in a chair a month ago and had pain.  Patient said the pain goes away when she lays on either side.  Patient's unable to sit unless it's a soft chair and she can bend over, but it hurts. Patient said she has numbness in her leg and foot.  Patient said her neuropathy is worse.  Patient wants a referral to a neurosurgeon in [place], [place] or [place]. Patient prefers afternoons.  Patient refused an appointment and speaking to a nurse.  Patient wanted a message left for Dr.Bedsole. Patient aware Dr.Bedsole is out of the office.  Patient said if she gets worse she'll go to the hospital.

## 2020-04-02 NOTE — Telephone Encounter (Signed)
Left message for Ms. Lisowski that she would have to see Dr. Diona Browner about this issue prior to a referral to a neurosurgeon. I ask that she call the office to schedule an appointment.

## 2020-04-07 NOTE — Telephone Encounter (Signed)
Patient called in to inform Dr.Bedsole that the pain was from her pseudo gout flare up. Please advise.

## 2020-04-07 NOTE — Telephone Encounter (Signed)
Spoke with Cathy Mullins and advised she would still need an appointment to be evaluated by Dr. Randa Spike. She states she needs to wait until Covid it out of her family.  She will call back to schedule office visit once family is cleared of Covid.

## 2020-04-08 ENCOUNTER — Ambulatory Visit: Payer: Medicare HMO | Admitting: Family Medicine

## 2020-04-11 ENCOUNTER — Telehealth: Payer: Self-pay

## 2020-04-11 MED ORDER — COLCRYS 0.6 MG PO TABS: 0.6000 mg | ORAL_TABLET | Freq: Every day | ORAL | 3 refills | Status: DC

## 2020-04-11 NOTE — Telephone Encounter (Signed)
Pt needs a refill of Colcyrs sent to [facility] I have sent that in.

## 2020-04-15 DIAGNOSIS — L658 Other specified nonscarring hair loss: Secondary | ICD-10-CM | POA: Diagnosis not present

## 2020-04-16 ENCOUNTER — Telehealth: Payer: Self-pay | Admitting: *Deleted

## 2020-04-16 MED ORDER — COLCRYS 0.6 MG PO TABS: 0.6000 mg | ORAL_TABLET | Freq: Every day | ORAL | 0 refills | Status: DC

## 2020-04-16 NOTE — Telephone Encounter (Signed)
Call patient is she currently having a flare?

## 2020-04-16 NOTE — Telephone Encounter (Addendum)
Spoke with Cathy Mullins.  She states she is currently having a flare.  She is asking that we send a Rx to [facility] to see if they by chance have any in stock. Rx sent as requested.

## 2020-04-16 NOTE — Telephone Encounter (Signed)
Left message for Cathy Mullins to return my call.

## 2020-04-16 NOTE — Telephone Encounter (Signed)
Received fax from [facility] stating Colcrys 0.6 mg tablet is on manufacturer backorder.  Requesting alternative.  Please advise.

## 2020-05-02 ENCOUNTER — Telehealth: Payer: Self-pay

## 2020-05-02 ENCOUNTER — Ambulatory Visit: Payer: Medicare HMO | Admitting: Family Medicine

## 2020-05-02 NOTE — Telephone Encounter (Signed)
Have spoken with pt this morning and see note

## 2020-05-02 NOTE — Telephone Encounter (Signed)
[place] [facility] - Client TELEPHONE ADVICE RECORD [facility] Patient Name: Cathy Mullins Gender: Female DOB: [birth year] Age: 74 Y 14 D Return Phone Number: [phone] (Primary), [phone] (Secondary) Address: City/State/Zip: [place] [address] Client [facility] - Client Client Site [place] - Day Physician Eliezer Lofts - MD Contact Type Call Who Is Calling Patient / Member / Family / Caregiver Call Type Triage / Clinical Relationship To Patient Self Return Phone Number [phone] (Primary) Chief Complaint Arm Pain (no known cause) Reason for Call Symptomatic / Request for [place] states that she is having right arm and shoulder pain and it is excruciating pain. Translation No Nurse Assessment Nurse: Ronnald Ramp, RN, Miranda Date/Time (Eastern Time): 05/01/2020 2:51:20 PM Confirm and document reason for call. If symptomatic, describe symptoms. ---Caller states she is having pain in her right shoulder, collar bone, upper arm, and right side of her neck for 1 week. Also having soreness in her left buttock for 1 month. Does the patient have any new or worsening symptoms? ---Yes Will a triage be completed? ---Yes Related visit to physician within the last 2 weeks? ---No Does the PT have any chronic conditions? (i.e. diabetes, asthma, this includes High risk factors for pregnancy, etc.) ---Yes List chronic conditions. ---Chronic back pain, Diabetes, HTN, Glossopharyngeal neuralgia, Rapid heart beat, Fibromyalgia, Is this a behavioral health or substance abuse call? ---No Guidelines Guideline Title Affirmed Question Affirmed Notes Nurse Date/Time Eilene Ghazi Time) Shoulder Pain [1] MODERATE pain (e.g., interferes with normal activities) AND [2] present > 3 days Ronnald Ramp, RN, Miranda 05/01/2020 2:56:30 PM Back Pain [1] MODERATE back pain (e.g., interferes with normal activities)  AND [2] present > 3 days Ronnald Ramp, RN, Miranda 05/01/2020 3:04:31 PM Disp. Time Eilene Ghazi Time) Disposition Final User 05/01/2020 2:50:05 PM Attempt made - message left Ronnald Ramp, RN, Miranda PLEASE NOTE: All timestamps contained within this report are represented as [place] Standard Time. CONFIDENTIALTY NOTICE: This fax transmission is intended only for the addressee. It contains information that is legally privileged, confidential or otherwise protected from use or disclosure. If you are not the intended recipient, you are strictly prohibited from reviewing, disclosing, copying using or disseminating any of this information or taking any action in reliance on or regarding this information. If you have received this fax in error, please notify us immediately by telephone so that we can arrange for its return to [place]. Phone: [phone], Toll-Free: [phone], Fax: [phone] Page: 2 of 2 Call Id: [id number] [place]. Time Eilene Ghazi Time) Disposition Final User 05/01/2020 3:02:53 PM SEE PCP WITHIN 3 DAYS Ronnald Ramp, RN, Miranda 05/01/2020 3:09:31 PM SEE PCP WITHIN 3 DAYS Yes Ronnald Ramp, RN, Miranda Caller Disagree/Comply Comply Caller Understands Yes PreDisposition Call Doctor Care Advice Given Per Guideline SEE PCP WITHIN 3 DAYS: * You need to be seen within 2 or 3 days. * PCP VISIT: Call your doctor (or NP/PA) during regular office hours and make an appointment. A clinic or urgent care center are good places to go for care if your doctor's office is closed or you can't get an appointment. NOTE: If office will be open tomorrow, tell caller to call then, not in 3 days. PAIN MEDICINES: * For pain relief, you can take either acetaminophen, ibuprofen, or naproxen. * They are over-the-counter (OTC) pain drugs. You can buy them at the drugstore. CALL BACK IF: * Chest pain or breathing difficulty occurs * You become worse CARE ADVICE given per Shoulder Pain (Adult) guideline *  Severe pain occurs SEE PCP WITHIN 3 DAYS: *  You need to be seen within 2 or 3 days. * PCP VISIT: Call your doctor (or NP/PA) during regular office hours and make an appointment. A clinic or urgent care center are good places to go for care if your doctor's office is closed or you can't get an appointment. NOTE: If office will be open tomorrow, tell caller to call then, not in 3 days. USE HEAT: * Use a heat pack, heating pad, or warm wet washcloth. * Do this for 10 minutes three times a day. * This will help increase blood flow and decrease pain. SLEEP: * Sleep on your side with a pillow between your knees. * If you sleep on your back, place a pillow under your knees. ACTIVITY: * Continue ordinary activities as much as your pain permits. Continued activity is more healing for the back than rest. * Avoid any activities that cause severe pain. CALL BACK IF: * Numbness or weakness occurs, or bowel/bladder problems * You become worse CARE ADVICE given per Back Pain (Adult) guideline. Referrals REFERRED TO PCP OFFICE

## 2020-05-02 NOTE — Telephone Encounter (Signed)
I spoke with pts husband; pts husband tested + for covid; pain in arm is not as bad this morning. No CP or SOB. Pt said that she is self quarantining now since husband is + covid and someone is bringing home test for her to ck covid. Pt said she has no covid symptoms at this time and is covid vaccinated.UC & ED precautions given and pt voiced understanding. FYI to Dr Diona Browner.

## 2020-05-02 NOTE — Telephone Encounter (Signed)
Noted  

## 2020-05-02 NOTE — Telephone Encounter (Signed)
[place] [facility] - Client Nonclinical Telephone Record [facility] Client [place] [facility] - Client Client Site [place] - Night Physician Eliezer Lofts - MD Contact Type Call Who Is Calling Patient / Member / Family / Caregiver Caller Name [place] Phone Number [phone] Patient Name Cathy Mullins Patient DOB Sep 11, 1945 Call Type Message Only Information Provided Reason for Call Request to [facility] Appointment Initial Comment Caller states that she has an appointment for Monday the 27th but Husband just tested positive for Covid19. Needs to cancel appointment for Monday. Will call later to reschedule. Disp. Time Disposition Final User 05/01/2020 10:00:32 PM General Information Provided Yes Lawernce Keas Call Closed By: Lawernce Keas Transaction Date/Time: 05/01/2020 9:56:20 PM (ET)

## 2020-05-05 ENCOUNTER — Ambulatory Visit: Payer: Medicare HMO | Admitting: Family Medicine

## 2020-05-12 ENCOUNTER — Telehealth: Payer: Self-pay

## 2020-05-12 NOTE — Telephone Encounter (Signed)
Received call from patient wanted to reschedule appointment. She had to cancel due husband testing positive for covid. He will be out of his "quarinten" time on Thursday. He is still having symptoms and wife has been taking care of him. She has had negative covid test and has a repeat test on Thursday. Ok to make appointment in office?

## 2020-05-13 NOTE — Telephone Encounter (Signed)
I am assuming his quarantine was 10 days prior to Thursday.. She can make an appt 14 days after his test date.

## 2020-05-13 NOTE — Telephone Encounter (Signed)
Yes,okay to make appt 14 days after husband test date or symtpoms onset as long as she has no resp symptoms and no new exposure. Ideally she should have neg COVID test as planned as well.

## 2020-05-13 NOTE — Telephone Encounter (Signed)
Just to clarify.  It is okay for her to schedule appointment 14 days from the day her husband tested positive??

## 2020-05-14 NOTE — Telephone Encounter (Signed)
Left message for Cathy Mullins with information below about rescheduling her appointment.

## 2020-05-23 ENCOUNTER — Other Ambulatory Visit: Payer: Self-pay | Admitting: Family Medicine

## 2020-05-23 ENCOUNTER — Other Ambulatory Visit: Payer: Self-pay | Admitting: Gastroenterology

## 2020-05-30 ENCOUNTER — Encounter: Payer: Self-pay | Admitting: Family Medicine

## 2020-05-30 ENCOUNTER — Other Ambulatory Visit: Payer: Self-pay

## 2020-05-30 ENCOUNTER — Telehealth (INDEPENDENT_AMBULATORY_CARE_PROVIDER_SITE_OTHER): Payer: Medicare HMO | Admitting: Family Medicine

## 2020-05-30 VITALS — BP 140/84 | HR 86 | Temp 97.9°F | Ht 61.5 in | Wt 173.0 lb

## 2020-05-30 DIAGNOSIS — R609 Edema, unspecified: Secondary | ICD-10-CM | POA: Insufficient documentation

## 2020-05-30 DIAGNOSIS — R600 Localized edema: Secondary | ICD-10-CM

## 2020-05-30 DIAGNOSIS — M5442 Lumbago with sciatica, left side: Secondary | ICD-10-CM

## 2020-05-30 MED ORDER — PREDNISONE 20 MG PO TABS: ORAL_TABLET | ORAL | 0 refills | Status: DC

## 2020-05-30 NOTE — Patient Instructions (Addendum)
Complete prednisone course.   Start low back home PT.  Call for appt to re-evaluate... 2 weeks.  Elevate legs.  Low Back Sprain or Strain Rehab Ask your health care provider which exercises are safe for you. Do exercises exactly as told by your health care provider and adjust them as directed. It is normal to feel mild stretching, pulling, tightness, or discomfort as you do these exercises. Stop right away if you feel sudden pain or your pain gets worse. Do not begin these exercises until told by your health care provider. Stretching and range-of-motion exercises These exercises warm up your muscles and joints and improve the movement and flexibility of your back. These exercises also help to relieve pain, numbness, and tingling. Lumbar rotation  1. Lie on your back on a firm surface and bend your knees. 2. Straighten your arms out to your sides so each arm forms a 90-degree angle (right angle) with a side of your body. 3. Slowly move (rotate) both of your knees to one side of your body until you feel a stretch in your lower back (lumbar). Try not to let your shoulders lift off the floor. 4. Hold this position for __________ seconds. 5. Tense your abdominal muscles and slowly move your knees back to the starting position. 6. Repeat this exercise on the other side of your body. Repeat __________ times. Complete this exercise __________ times a day. Single knee to chest  1. Lie on your back on a firm surface with both legs straight. 2. Bend one of your knees. Use your hands to move your knee up toward your chest until you feel a gentle stretch in your lower back and buttock. ? Hold your leg in this position by holding on to the front of your knee. ? Keep your other leg as straight as possible. 3. Hold this position for __________ seconds. 4. Slowly return to the starting position. 5. Repeat with your other leg. Repeat __________ times. Complete this exercise __________ times a day. Prone  extension on elbows  1. Lie on your abdomen on a firm surface (prone position). 2. Prop yourself up on your elbows. 3. Use your arms to help lift your chest up until you feel a gentle stretch in your abdomen and your lower back. ? This will place some of your body weight on your elbows. If this is uncomfortable, try stacking pillows under your chest. ? Your hips should stay down, against the surface that you are lying on. Keep your hip and back muscles relaxed. 4. Hold this position for __________ seconds. 5. Slowly relax your upper body and return to the starting position. Repeat __________ times. Complete this exercise __________ times a day. Strengthening exercises These exercises build strength and endurance in your back. Endurance is the ability to use your muscles for a long time, even after they get tired. Pelvic tilt This exercise strengthens the muscles that lie deep in the abdomen. 1. Lie on your back on a firm surface. Bend your knees and keep your feet flat on the floor. 2. Tense your abdominal muscles. Tip your pelvis up toward the ceiling and flatten your lower back into the floor. ? To help with this exercise, you may place a small towel under your lower back and try to push your back into the towel. 3. Hold this position for __________ seconds. 4. Let your muscles relax completely before you repeat this exercise. Repeat __________ times. Complete this exercise __________ times a day. Alternating arm and  leg raises  1. Get on your hands and knees on a firm surface. If you are on a hard floor, you may want to use padding, such as an exercise mat, to cushion your knees. 2. Line up your arms and legs. Your hands should be directly below your shoulders, and your knees should be directly below your hips. 3. Lift your left leg behind you. At the same time, raise your right arm and straighten it in front of you. ? Do not lift your leg higher than your hip. ? Do not lift your arm  higher than your shoulder. ? Keep your abdominal and back muscles tight. ? Keep your hips facing the ground. ? Do not arch your back. ? Keep your balance carefully, and do not hold your breath. 4. Hold this position for __________ seconds. 5. Slowly return to the starting position. 6. Repeat with your right leg and your left arm. Repeat __________ times. Complete this exercise __________ times a day. Abdominal set with straight leg raise  1. Lie on your back on a firm surface. 2. Bend one of your knees and keep your other leg straight. 3. Tense your abdominal muscles and lift your straight leg up, 4-6 inches (10-15 cm) off the ground. 4. Keep your abdominal muscles tight and hold this position for __________ seconds. ? Do not hold your breath. ? Do not arch your back. Keep it flat against the ground. 5. Keep your abdominal muscles tense as you slowly lower your leg back to the starting position. 6. Repeat with your other leg. Repeat __________ times. Complete this exercise __________ times a day. Single leg lower with bent knees 1. Lie on your back on a firm surface. 2. Tense your abdominal muscles and lift your feet off the floor, one foot at a time, so your knees and hips are bent in 90-degree angles (right angles). ? Your knees should be over your hips and your lower legs should be parallel to the floor. 3. Keeping your abdominal muscles tense and your knee bent, slowly lower one of your legs so your toe touches the ground. 4. Lift your leg back up to return to the starting position. ? Do not hold your breath. ? Do not let your back arch. Keep your back flat against the ground. 5. Repeat with your other leg. Repeat __________ times. Complete this exercise __________ times a day. Posture and body mechanics Good posture and healthy body mechanics can help to relieve stress in your body's tissues and joints. Body mechanics refers to the movements and positions of your body while you do  your daily activities. Posture is part of body mechanics. Good posture means:  Your spine is in its natural S-curve position (neutral).  Your shoulders are pulled back slightly.  Your head is not tipped forward. Follow these guidelines to improve your posture and body mechanics in your everyday activities. Standing   When standing, keep your spine neutral and your feet about hip width apart. Keep a slight bend in your knees. Your ears, shoulders, and hips should line up.  When you do a task in which you stand in one place for a long time, place one foot up on a stable object that is 2-4 inches (5-10 cm) high, such as a footstool. This helps keep your spine neutral. Sitting   When sitting, keep your spine neutral and keep your feet flat on the floor. Use a footrest, if necessary, and keep your thighs parallel to the floor.  Avoid rounding your shoulders, and avoid tilting your head forward.  When working at a desk or a computer, keep your desk at a height where your hands are slightly lower than your elbows. Slide your chair under your desk so you are close enough to maintain good posture.  When working at a computer, place your monitor at a height where you are looking straight ahead and you do not have to tilt your head forward or downward to look at the screen. Resting  When lying down and resting, avoid positions that are most painful for you.  If you have pain with activities such as sitting, bending, stooping, or squatting, lie in a position in which your body does not bend very much. For example, avoid curling up on your side with your arms and knees near your chest (fetal position).  If you have pain with activities such as standing for a long time or reaching with your arms, lie with your spine in a neutral position and bend your knees slightly. Try the following positions: ? Lying on your side with a pillow between your knees. ? Lying on your back with a pillow under your  knees. Lifting   When lifting objects, keep your feet at least shoulder width apart and tighten your abdominal muscles.  Bend your knees and hips and keep your spine neutral. It is important to lift using the strength of your legs, not your back. Do not lock your knees straight out.  Always ask for help to lift heavy or awkward objects. This information is not intended to replace advice given to you by your health care provider. Make sure you discuss any questions you have with your health care provider. Document Revised: 11/17/2018 Document Reviewed: 08/17/2018 Elsevier Patient Education  [place].

## 2020-05-30 NOTE — Assessment & Plan Note (Signed)
Inalterable, no clear suggestion of DVT. Unclear if related to back pain, circulatory isse or other. Seems to be improving with elevation so may be venous insufficiency. May be secondary to recent decrease in activity given possible COVID. Continue elevation and follow up in person if not resolving.

## 2020-05-30 NOTE — Assessment & Plan Note (Signed)
Most likely cause of left sided nerve pain. Spinal stenosis may be causing some of the Bilateral leg issue.   Will treat with heat, home PT and prednisone taper.

## 2020-05-30 NOTE — Progress Notes (Signed)
VIRTUAL VISIT Due to national recommendations of social distancing due to [place] 19, a virtual visit is felt to be most appropriate for this patient at this time.   I connected with the patient on 05/30/20 at  2:20 PM EDT by virtual telehealth platform and verified that I am speaking with the correct person using two identifiers.   I discussed the limitations, risks, security and privacy concerns of performing an evaluation and management service by  virtual telehealth platform and the availability of in person appointments. I also discussed with the patient that there may be a patient responsible charge related to this service. The patient expressed understanding and agreed to proceed.  Patient location: Home Provider Location: [facility] [place] Participants: Eliezer Lofts and Rise Paganini   Chief Complaint  Patient presents with  . Back Pain    ?due to Covid  . Left Buttock Pain    Radiates down to groin-sitting is very painful  . Hot Feet    Radiates up legs-turns blood red and swells    History of Present Illness:   74 year old female presents with new onset low back pain, left buttock pain radiating to left groin ongoing x 2 months.  Recent possible COVID infeciton, sinus pressure, congestion.. husband tested positive   Woke up with sudden onset pain in left buttock and down left leg.  HAS electrixc shock pain in bth legs.  NO numbness , left leg weakness... feel "draggy"  She has used topical diclofenac cream,tylenol.. no benefit. Tramadol off and on not helping.  Continues to have hot feeling feet.  Both feet swelling, left greater than  Right.  No known falls.  Hx of spinal stenosis in lumbar spine.  COVID 19 screen No recent travel or known exposure to COVID19 The patient denies respiratory symptoms of COVID 19 at this time.  The importance of social distancing was discussed today.   Review of Systems  Constitutional: Negative for chills and fever.  HENT:  Positive for congestion. Negative for ear pain.   Eyes: Negative for pain and redness.  Respiratory: Negative for cough and shortness of breath.   Cardiovascular: Negative for chest pain, palpitations and leg swelling.  Gastrointestinal: Negative for abdominal pain, blood in stool, constipation, diarrhea, nausea and vomiting.  Genitourinary: Negative for dysuria.  Musculoskeletal: Positive for back pain. Negative for falls and myalgias.  Skin: Negative for rash.  Neurological: Negative for dizziness.  Psychiatric/Behavioral: Negative for depression. The patient is not nervous/anxious.       Past Medical History:  Diagnosis Date  . Allergy   . Asthma with COPD ([place])   . Depression    daughter was very sick, depression began during this period of time   . Diabetes mellitus   . Esophagitis   . Fibromyalgia   . GERD (gastroesophageal reflux disease)   . Glaucoma   . History of colon polyps   . HTN (hypertension)   . Hyperlipidemia   . Lichen sclerosus   . Migraine   . Perforating folliculitis   . Spinal stenosis   . Tachycardia   . TIA (transient ischemic attack)     reports that she quit smoking about 31 years ago. She has never used smokeless tobacco. She reports that she does not drink alcohol and does not use drugs.   Current Outpatient Medications:  .  ACCU-CHEK SOFTCLIX LANCETS lancets, Use to check blood sugar two times a day.  Dx:250.00 Non-Insulin, Disp: 200 each, Rfl: 3 .  albuterol (PROVENTIL HFA;VENTOLIN HFA) 108 (90 BASE) MCG/ACT inhaler, 2 puffs every 4 hours as needed for wheezing., Disp: 1 Inhaler, Rfl: 0 .  aspirin 81 MG tablet, Take 81 mg by mouth as needed., Disp: , Rfl:  .  Blood Glucose Monitoring Suppl (ACCU-CHEK AVIVA) device, Use to check blood sugars two times a day.  Dx:250.00 Non-Insulin, Disp: 1 each, Rfl: 0 .  Cholecalciferol (VITAMIN D3 PO), Take 1,000 Units by mouth daily., Disp: , Rfl:  .  clobetasol ointment (TEMOVATE) 0.05 %, Apply topically 2  (two) times daily. Use 2 to 3 times weekly, Disp: 30 g, Rfl: 0 .  COLCRYS 0.6 MG tablet, Take 1 tablet (0.6 mg total) by mouth daily., Disp: 90 tablet, Rfl: 0 .  desipramine (NORPRAMIN) 25 MG tablet, TAKE 1 TABLET AT BEDTIME, Disp: 90 tablet, Rfl: 1 .  diclofenac sodium (VOLTAREN) 1 % GEL, APPLY 2 GRAMS TOPICALLY FOUR TIMES A DAY AS NEEDED, Disp: 300 g, Rfl: 1 .  ferrous sulfate 325 (65 FE) MG tablet, Take 325 mg by mouth daily with breakfast., Disp: , Rfl:  .  finasteride (PROSCAR) 5 MG tablet, Take 0.5 tablets by mouth daily., Disp: , Rfl:  .  glucose blood (ACCU-CHEK AVIVA PLUS) test strip, Use to check blood sugar two times a day.  Dx:250.00 Non-Insulin, Disp: 200 each, Rfl: 3 .  Homeopathic Products (T-RELIEF EXTRA STRENGTH) CREA, Apply topically., Disp: , Rfl:  .  hydrochlorothiazide (HYDRODIURIL) 25 MG tablet, TAKE 1 TABLET EVERY DAY, Disp: 90 tablet, Rfl: 1 .  latanoprost (XALATAN) 0.005 % ophthalmic solution, Place 1 drop into both eyes at bedtime., Disp: , Rfl:  .  Magnesium 200 MG TABS, Take by mouth., Disp: , Rfl:  .  metFORMIN (GLUCOPHAGE-XR) 500 MG 24 hr tablet, TAKE 1 TABLET TWICE DAILY, Disp: 180 tablet, Rfl: 3 .  metoprolol tartrate (LOPRESSOR) 50 MG tablet, TAKE 1 TABLET TWICE DAILY, Disp: 180 tablet, Rfl: 1 .  Multiple Vitamins-Minerals (CENTRUM ADULTS PO), Take 1 tablet by mouth daily., Disp: , Rfl:  .  mupirocin ointment (BACTROBAN) 2 %, Apply 1 application topically 2 (two) times daily., Disp: 22 g, Rfl: 0 .  neomycin-bacitracin-polymyxin (NEOSPORIN) ointment, Apply 1 application topically as needed for wound care. , Disp: , Rfl:  .  timolol (TIMOPTIC) 0.5 % ophthalmic solution, Place 1 drop into both eyes daily. , Disp: , Rfl:  .  traMADol (ULTRAM) 50 MG tablet, Take 1 tablet (50 mg total) by mouth 4 (four) times daily., Disp: 120 tablet, Rfl: 3  Current Facility-Administered Medications:  .  betamethasone acetate-betamethasone sodium phosphate (CELESTONE) injection 3 mg, 3  mg, Intramuscular, Once, Evans, Brent M, DPM .  betamethasone acetate-betamethasone sodium phosphate (CELESTONE) injection 3 mg, 3 mg, Intramuscular, Once, Evans, Brent M, DPM .  betamethasone acetate-betamethasone sodium phosphate (CELESTONE) injection 3 mg, 3 mg, Intramuscular, Once, Evans, Dorathy Daft, DPM   Observations/Objective: Blood pressure 140/84, pulse 86, temperature 97.9 F (36.6 C), temperature source Oral, height 5' 1.5" (1.562 m), weight 173 lb (78.5 kg), SpO2 95 %.  Physical Exam  Physical Exam Constitutional:      General: The patient is not in acute distress. Pulmonary:     Effort: Pulmonary effort is normal. No respiratory distress.  Neurological:     Mental Status: The patient is alert and oriented to person, place, and time.  Psychiatric:        Mood and Affect: Mood normal.        Behavior: Behavior normal.   Assessment  and Plan Acute left-sided low back pain with left-sided sciatica Most likely cause of left sided nerve pain. Spinal stenosis may be causing some of the Bilateral leg issue.   Will treat with heat, home PT and prednisone taper.   Peripheral edema Inalterable, no clear suggestion of DVT. Unclear if related to back pain, circulatory isse or other. Seems to be improving with elevation so may be venous insufficiency. May be secondary to recent decrease in activity given possible COVID. Continue elevation and follow up in person if not resolving.     I discussed the assessment and treatment plan with the patient. The patient was provided an opportunity to ask questions and all were answered. The patient agreed with the plan and demonstrated an understanding of the instructions.   The patient was advised to call back or seek an in-person evaluation if the symptoms worsen or if the condition fails to improve as anticipated.     Eliezer Lofts, MD

## 2020-06-08 ENCOUNTER — Other Ambulatory Visit: Payer: Self-pay | Admitting: Family Medicine

## 2020-06-13 ENCOUNTER — Telehealth: Payer: Self-pay | Admitting: Family Medicine

## 2020-06-13 NOTE — Telephone Encounter (Signed)
PT CALLED IN DUE TO WHEN SHE COMES IN SHE WANTS TO BE GET HER IRON LEVEL , HER SKIN DOCTOR FOUND OUT THAT HER IRON WAS LOW.

## 2020-06-16 ENCOUNTER — Telehealth: Payer: Self-pay | Admitting: Family Medicine

## 2020-06-16 DIAGNOSIS — E1122 Type 2 diabetes mellitus with diabetic chronic kidney disease: Secondary | ICD-10-CM

## 2020-06-16 DIAGNOSIS — N1831 Chronic kidney disease, stage 3a: Secondary | ICD-10-CM

## 2020-06-16 NOTE — Telephone Encounter (Signed)
-----   Message from Cloyd Stagers, RT sent at 06/03/2020  1:57 PM EDT ----- Regarding: Lab Orders for Tuesday 11.9.2021 Please place lab orders for Tuesday 11.9.2021, office visit for physical on Tuesday 11.16.2021 Thank you, Dyke Maes RT(R)

## 2020-06-17 ENCOUNTER — Other Ambulatory Visit: Payer: Self-pay

## 2020-06-17 ENCOUNTER — Other Ambulatory Visit (INDEPENDENT_AMBULATORY_CARE_PROVIDER_SITE_OTHER): Payer: Medicare HMO

## 2020-06-17 DIAGNOSIS — E1122 Type 2 diabetes mellitus with diabetic chronic kidney disease: Secondary | ICD-10-CM

## 2020-06-17 DIAGNOSIS — N1831 Chronic kidney disease, stage 3a: Secondary | ICD-10-CM

## 2020-06-17 LAB — COMPREHENSIVE METABOLIC PANEL
ALT: 19 U/L (ref 0–35)
AST: 20 U/L (ref 0–37)
Albumin: 4.4 g/dL (ref 3.5–5.2)
Alkaline Phosphatase: 60 U/L (ref 39–117)
BUN: 22 mg/dL (ref 6–23)
CO2: 31 mEq/L (ref 19–32)
Calcium: 9.9 mg/dL (ref 8.4–10.5)
Chloride: 97 mEq/L (ref 96–112)
Creatinine, Ser: 1.12 mg/dL (ref 0.40–1.20)
GFR: 48.56 mL/min — ABNORMAL LOW (ref >60.00)
Glucose, Bld: 142 mg/dL — ABNORMAL HIGH (ref 70–99)
Potassium: 4.6 mEq/L (ref 3.5–5.1)
Sodium: 135 mEq/L (ref 135–145)
Total Bilirubin: 0.3 mg/dL (ref 0.2–1.2)
Total Protein: 7.7 g/dL (ref 6.0–8.3)

## 2020-06-17 LAB — LIPID PANEL
Cholesterol: 261 mg/dL — ABNORMAL HIGH (ref 0–200)
HDL: 57.1 mg/dL (ref >39.00)
NonHDL: 203.67
Total CHOL/HDL Ratio: 5
Triglycerides: 327 mg/dL — ABNORMAL HIGH (ref 0.0–149.0)
VLDL: 65.4 mg/dL — ABNORMAL HIGH (ref 0.0–40.0)

## 2020-06-17 LAB — LDL CHOLESTEROL, DIRECT: Direct LDL: 164 mg/dL

## 2020-06-17 LAB — HEMOGLOBIN A1C: Hgb A1c MFr Bld: 7.3 % — ABNORMAL HIGH (ref 4.6–6.5)

## 2020-06-17 NOTE — Progress Notes (Signed)
No critical labs need to be addressed urgently. We will discuss labs in detail at upcoming office visit.   

## 2020-06-24 ENCOUNTER — Other Ambulatory Visit: Payer: Self-pay

## 2020-06-24 ENCOUNTER — Encounter: Payer: Self-pay | Admitting: Family Medicine

## 2020-06-24 ENCOUNTER — Ambulatory Visit (INDEPENDENT_AMBULATORY_CARE_PROVIDER_SITE_OTHER): Payer: Medicare HMO | Admitting: Family Medicine

## 2020-06-24 VITALS — BP 120/70 | HR 89 | Temp 98.8°F | Ht 61.5 in | Wt 177.5 lb

## 2020-06-24 DIAGNOSIS — N1831 Chronic kidney disease, stage 3a: Secondary | ICD-10-CM

## 2020-06-24 DIAGNOSIS — M5442 Lumbago with sciatica, left side: Secondary | ICD-10-CM | POA: Diagnosis not present

## 2020-06-24 DIAGNOSIS — E1122 Type 2 diabetes mellitus with diabetic chronic kidney disease: Secondary | ICD-10-CM | POA: Diagnosis not present

## 2020-06-24 DIAGNOSIS — E1169 Type 2 diabetes mellitus with other specified complication: Secondary | ICD-10-CM

## 2020-06-24 DIAGNOSIS — Z23 Encounter for immunization: Secondary | ICD-10-CM | POA: Diagnosis not present

## 2020-06-24 DIAGNOSIS — E1159 Type 2 diabetes mellitus with other circulatory complications: Secondary | ICD-10-CM

## 2020-06-24 DIAGNOSIS — E785 Hyperlipidemia, unspecified: Secondary | ICD-10-CM

## 2020-06-24 DIAGNOSIS — I152 Hypertension secondary to endocrine disorders: Secondary | ICD-10-CM | POA: Diagnosis not present

## 2020-06-24 DIAGNOSIS — N183 Chronic kidney disease, stage 3 unspecified: Secondary | ICD-10-CM

## 2020-06-24 DIAGNOSIS — E1129 Type 2 diabetes mellitus with other diabetic kidney complication: Secondary | ICD-10-CM | POA: Diagnosis not present

## 2020-06-24 NOTE — Progress Notes (Signed)
Chief Complaint  Patient presents with  . Follow-up    6 month    History of Present Illness: HPI   74 year old female presents for 6 month follow up diabetes.  Diabetes:  Slight worsening of control on metformin. Recent prednisone taper for back pain 05/30/2020.. but pt reports she was scared to take.   She has not been eating as well as she has.. eating more sweets. Lab Results  Component Value Date   HGBA1C 7.3 (H) 06/17/2020  Using medications without difficulties: Hypoglycemic episodes: Hyperglycemic episodes: Feet problems: She has been having swelling in feet off and on , they get red and hot and sore. Improves some in AM. Blood Sugars averaging: eye exam within last year: yes, get records from [facility].  Has varicose veins.    Elevated Cholesterol: Very high risk of CAD.Marland Kitchen statin indicated but pt refuses given SE in past. She understands risk of not treating with statin. Using medications without problems: Muscle aches:  Diet compliance: poor Exercise: minimal Other complaints: The 10-year ASCVD risk score Mikey Bussing DC Brooke Bonito., et al., 2013) is: 39.8%   Values used to calculate the score:     Age: 87 years     Sex: Female     Is Non-Hispanic African American: No     Diabetic: Yes     Tobacco smoker: No     Systolic Blood Pressure: 767 mmHg     Is BP treated: Yes     HDL Cholesterol: 57.1 mg/dL     Total Cholesterol: 261 mg/dL      This visit occurred during the SARS-CoV-2 public health emergency.  Safety protocols were in place, including screening questions prior to the visit, additional usage of staff PPE, and extensive cleaning of exam room while observing appropriate contact time as indicated for disinfecting solutions.   COVID 19 screen:  No recent travel or known exposure to COVID19 The patient denies respiratory symptoms of COVID 19 at this time. The importance of social distancing was discussed today.     Review of Systems  Constitutional:  Negative for chills and fever.  HENT: Negative for congestion and ear pain.   Eyes: Negative for pain and redness.  Respiratory: Negative for cough and shortness of breath.   Cardiovascular: Negative for chest pain, palpitations and leg swelling.  Gastrointestinal: Negative for abdominal pain, blood in stool, constipation, diarrhea, nausea and vomiting.  Genitourinary: Negative for dysuria.  Musculoskeletal: Positive for back pain. Negative for falls and myalgias.  Skin: Negative for rash.  Neurological: Negative for dizziness.  Psychiatric/Behavioral: Negative for depression. The patient is not nervous/anxious.       Past Medical History:  Diagnosis Date  . Allergy   . Asthma with COPD ([place])   . Depression    daughter was very sick, depression began during this period of time   . Diabetes mellitus   . Esophagitis   . Fibromyalgia   . GERD (gastroesophageal reflux disease)   . Glaucoma   . History of colon polyps   . HTN (hypertension)   . Hyperlipidemia   . Lichen sclerosus   . Migraine   . Perforating folliculitis   . Spinal stenosis   . Tachycardia   . TIA (transient ischemic attack)     reports that she quit smoking about 31 years ago. She has never used smokeless tobacco. She reports that she does not drink alcohol and does not use drugs.   Current Outpatient Medications:  .  ACCU-CHEK  SOFTCLIX LANCETS lancets, Use to check blood sugar two times a day.  Dx:250.00 Non-Insulin, Disp: 200 each, Rfl: 3 .  albuterol (PROVENTIL HFA;VENTOLIN HFA) 108 (90 BASE) MCG/ACT inhaler, 2 puffs every 4 hours as needed for wheezing., Disp: 1 Inhaler, Rfl: 0 .  aspirin 81 MG tablet, Take 81 mg by mouth as needed., Disp: , Rfl:  .  Blood Glucose Monitoring Suppl (ACCU-CHEK AVIVA) device, Use to check blood sugars two times a day.  Dx:250.00 Non-Insulin, Disp: 1 each, Rfl: 0 .  Cholecalciferol (VITAMIN D3 PO), Take 1,000 Units by mouth daily., Disp: , Rfl:  .  desipramine (NORPRAMIN) 25 MG  tablet, TAKE 1 TABLET AT BEDTIME, Disp: 90 tablet, Rfl: 1 .  diclofenac sodium (VOLTAREN) 1 % GEL, APPLY 2 GRAMS TOPICALLY FOUR TIMES A DAY AS NEEDED, Disp: 300 g, Rfl: 1 .  ferrous sulfate 325 (65 FE) MG tablet, Take 325 mg by mouth daily with breakfast., Disp: , Rfl:  .  glucose blood (ACCU-CHEK AVIVA PLUS) test strip, Use to check blood sugar two times a day.  Dx:250.00 Non-Insulin, Disp: 200 each, Rfl: 3 .  hydrochlorothiazide (HYDRODIURIL) 25 MG tablet, TAKE 1 TABLET EVERY DAY, Disp: 90 tablet, Rfl: 1 .  latanoprost (XALATAN) 0.005 % ophthalmic solution, Place 1 drop into both eyes at bedtime., Disp: , Rfl:  .  Magnesium 200 MG TABS, Take by mouth., Disp: , Rfl:  .  metFORMIN (GLUCOPHAGE-XR) 500 MG 24 hr tablet, TAKE 1 TABLET TWICE DAILY, Disp: 180 tablet, Rfl: 3 .  metoprolol tartrate (LOPRESSOR) 50 MG tablet, TAKE 1 TABLET TWICE DAILY, Disp: 180 tablet, Rfl: 1 .  Multiple Vitamins-Minerals (CENTRUM ADULTS PO), Take 1 tablet by mouth daily., Disp: , Rfl:  .  neomycin-bacitracin-polymyxin (NEOSPORIN) ointment, Apply 1 application topically as needed for wound care. , Disp: , Rfl:  .  predniSONE (DELTASONE) 20 MG tablet, 3 tabs by mouth daily x 3 days, then 2 tabs by mouth daily x 2 days then 1 tab by mouth daily x 2 days, Disp: 15 tablet, Rfl: 0 .  timolol (TIMOPTIC) 0.5 % ophthalmic solution, Place 1 drop into both eyes daily. , Disp: , Rfl:  .  traMADol (ULTRAM) 50 MG tablet, Take 1 tablet (50 mg total) by mouth 4 (four) times daily., Disp: 120 tablet, Rfl: 3  Current Facility-Administered Medications:  .  betamethasone acetate-betamethasone sodium phosphate (CELESTONE) injection 3 mg, 3 mg, Intramuscular, Once, Evans, Brent M, DPM .  betamethasone acetate-betamethasone sodium phosphate (CELESTONE) injection 3 mg, 3 mg, Intramuscular, Once, Evans, Brent M, DPM .  betamethasone acetate-betamethasone sodium phosphate (CELESTONE) injection 3 mg, 3 mg, Intramuscular, Once, Evans, Dorathy Daft, DPM    Observations/Objective: Pulse 89, temperature 98.8 F (37.1 C), temperature source Temporal, height 5' 1.5" (1.562 m), weight 177 lb 8 oz (80.5 kg), SpO2 95 %.  Physical Exam Constitutional:      General: She is not in acute distress.    Appearance: Normal appearance. She is well-developed. She is not ill-appearing or toxic-appearing.  HENT:     Head: Normocephalic.     Right Ear: Hearing, tympanic membrane, ear canal and external ear normal. Tympanic membrane is not erythematous, retracted or bulging.     Left Ear: Hearing, tympanic membrane, ear canal and external ear normal. Tympanic membrane is not erythematous, retracted or bulging.     Nose: No mucosal edema or rhinorrhea.     Right Sinus: No maxillary sinus tenderness or frontal sinus tenderness.     Left Sinus:  No maxillary sinus tenderness or frontal sinus tenderness.     Mouth/Throat:     Pharynx: Uvula midline.  Eyes:     General: Lids are normal. Lids are everted, no foreign bodies appreciated.     Conjunctiva/sclera: Conjunctivae normal.     Pupils: Pupils are equal, round, and reactive to light.  Neck:     Thyroid: No thyroid mass or thyromegaly.     Vascular: No carotid bruit.     Trachea: Trachea normal.  Cardiovascular:     Rate and Rhythm: Normal rate and regular rhythm.     Pulses: Normal pulses.     Heart sounds: Normal heart sounds, S1 normal and S2 normal. No murmur heard.  No friction rub. No gallop.   Pulmonary:     Effort: Pulmonary effort is normal. No tachypnea or respiratory distress.     Breath sounds: Normal breath sounds. No decreased breath sounds, wheezing, rhonchi or rales.  Abdominal:     General: Bowel sounds are normal.     Palpations: Abdomen is soft.     Tenderness: There is no abdominal tenderness.  Musculoskeletal:     Cervical back: Normal range of motion and neck supple.     Thoracic back: Normal.     Lumbar back: Tenderness present. No bony tenderness. Decreased range of motion.  Positive left straight leg raise test. Negative right straight leg raise test.  Skin:    General: Skin is warm and dry.     Findings: No rash.  Neurological:     Mental Status: She is alert.  Psychiatric:        Mood and Affect: Mood is not anxious or depressed.        Speech: Speech normal.        Behavior: Behavior normal. Behavior is cooperative.        Thought Content: Thought content normal.        Judgment: Judgment normal.      Assessment and Plan Hyperlipidemia associated with type 2 diabetes mellitus ([place]) Very high risk of CAD.Marland Kitchen statin indicated but pt refuses given SE in past. She understands risk of not treating with statin. She will look into zetia and let me know if she is comfortable with starting this.  SE to [facility] in past.  DM (diabetes mellitus), type 2 with renal complications ([place])  Worsened control given decreased activity with back pain. Encouraged exercise, weight loss, healthy eating habits.   Hypertension associated with diabetes ([place]) Well controlled. Continue current medication.   Acute left-sided low back pain with left-sided sciatica Recommended prednisone taper.  Start home PT ( refused formal PT referral).   CKD stage 3 due to type 2 diabetes mellitus ([facility]) Stable control.      Eliezer Lofts, MD

## 2020-06-24 NOTE — Assessment & Plan Note (Signed)
Worsened control given decreased activity with back pain. Encouraged exercise, weight loss, healthy eating habits.

## 2020-06-24 NOTE — Assessment & Plan Note (Signed)
Well controlled. Continue current medication.  

## 2020-06-24 NOTE — Assessment & Plan Note (Signed)
Recommended prednisone taper.  Start home PT ( refused formal PT referral).

## 2020-06-24 NOTE — Assessment & Plan Note (Signed)
Stable control. 

## 2020-06-24 NOTE — Assessment & Plan Note (Addendum)
Very high risk of CAD.Marland Kitchen statin indicated but pt refuses given SE in past. She understands risk of not treating with statin. She will look into zetia and let me know if she is comfortable with starting this.  SE to [facility] in past.

## 2020-06-24 NOTE — Patient Instructions (Addendum)
Get back to regular exercise, work on low carb diet. Try prednisone taper. Start home PT several times daily. Consider zetia for cholesterol

## 2020-06-30 ENCOUNTER — Telehealth: Payer: Self-pay

## 2020-06-30 NOTE — Telephone Encounter (Signed)
Monica with [employer] left v/m requesting the return status of fax that was sent on 06/23/20 and 06/29/20 for drug therapy alert with statin therapy with pts who dx with diabetes and cardiovascular disease. Monica request to be faxed back to [phone]. Sending note to Dr Diona Browner and Butch Penny CMA.

## 2020-07-02 NOTE — Telephone Encounter (Signed)
Form faxed by to [facility] letting them know that Cathy Mullins declined statin therapy.

## 2020-07-02 NOTE — Telephone Encounter (Signed)
Discarded.. pt refused statin.

## 2020-08-12 ENCOUNTER — Other Ambulatory Visit: Payer: Self-pay

## 2020-08-12 ENCOUNTER — Ambulatory Visit: Payer: Medicare HMO | Admitting: Podiatry

## 2020-08-12 DIAGNOSIS — M659 Synovitis and tenosynovitis, unspecified: Secondary | ICD-10-CM | POA: Diagnosis not present

## 2020-08-12 DIAGNOSIS — M4716 Other spondylosis with myelopathy, lumbar region: Secondary | ICD-10-CM | POA: Diagnosis not present

## 2020-08-12 DIAGNOSIS — E0843 Diabetes mellitus due to underlying condition with diabetic autonomic (poly)neuropathy: Secondary | ICD-10-CM

## 2020-08-15 ENCOUNTER — Other Ambulatory Visit: Payer: Self-pay | Admitting: *Deleted

## 2020-08-15 MED ORDER — TRAMADOL HCL 50 MG PO TABS: 50.0000 mg | ORAL_TABLET | Freq: Four times a day (QID) | ORAL | 3 refills | Status: DC

## 2020-08-15 NOTE — Telephone Encounter (Signed)
Last office visit 06/24/2020 for 6 month follow up.. Last refilled 01/30/2020 for #120 with 3 refills.  CPE scheduled for 12/26/2020.

## 2020-08-18 NOTE — Telephone Encounter (Signed)
Patient called in stating she fell on new years. Stating she is needing to have an MRI done, but referral must be placed. Asked that this be done ASAP as in a lot of pain. Please advise.

## 2020-08-20 ENCOUNTER — Telehealth: Payer: Self-pay

## 2020-08-20 ENCOUNTER — Other Ambulatory Visit: Payer: Self-pay | Admitting: *Deleted

## 2020-08-20 DIAGNOSIS — E1122 Type 2 diabetes mellitus with diabetic chronic kidney disease: Secondary | ICD-10-CM

## 2020-08-20 DIAGNOSIS — N1831 Chronic kidney disease, stage 3a: Secondary | ICD-10-CM

## 2020-08-20 MED ORDER — ACCU-CHEK SOFTCLIX LANCETS MISC: 3 refills | Status: DC

## 2020-08-20 MED ORDER — ACCU-CHEK AVIVA PLUS VI STRP: ORAL_STRIP | 3 refills | Status: DC

## 2020-08-20 MED ORDER — ACCU-CHEK AVIVA VI SOLN: 3 refills | Status: AC

## 2020-08-20 MED ORDER — ACCU-CHEK AVIVA PLUS W/DEVICE KIT: PACK | 0 refills | Status: AC

## 2020-08-20 MED ORDER — BD SWAB SINGLE USE REGULAR PADS: MEDICATED_PAD | 3 refills | Status: DC

## 2020-08-20 NOTE — Telephone Encounter (Signed)
Error see pt message 08/09/20.

## 2020-08-20 NOTE — Telephone Encounter (Signed)
I was out of office on 08/18/20 and 08/19/20. I will call pt now. I left v/m requesting pt to cb to [facility]. I did speak with Mr Berke (DPR signed). Pt went to ortho walkin and has herniated slipped disc and was advised needed MRI scheduled. Pt is in a lot of pain first thing in the morning. The tramadol is helping the pain. Pt does not want to go to UC or ED. Pt scheduled appt with DR Copland on 08/21/20 at 11 AM. UC & ED parecautions given to pts husband and he voiced understanding. No covid symptoms. Sending note to Dr Diona Browner who is out of office and Dr Lorelei Pont.

## 2020-08-21 ENCOUNTER — Ambulatory Visit (INDEPENDENT_AMBULATORY_CARE_PROVIDER_SITE_OTHER): Payer: Medicare HMO | Admitting: Family Medicine

## 2020-08-21 ENCOUNTER — Encounter: Payer: Self-pay | Admitting: Family Medicine

## 2020-08-21 ENCOUNTER — Other Ambulatory Visit: Payer: Self-pay

## 2020-08-21 VITALS — BP 130/74 | HR 62 | Temp 98.4°F | Ht 61.5 in | Wt 179.5 lb

## 2020-08-21 DIAGNOSIS — W19XXXA Unspecified fall, initial encounter: Secondary | ICD-10-CM

## 2020-08-21 DIAGNOSIS — J449 Chronic obstructive pulmonary disease, unspecified: Secondary | ICD-10-CM | POA: Diagnosis not present

## 2020-08-21 DIAGNOSIS — R200 Anesthesia of skin: Secondary | ICD-10-CM | POA: Diagnosis not present

## 2020-08-21 DIAGNOSIS — M5441 Lumbago with sciatica, right side: Secondary | ICD-10-CM

## 2020-08-21 NOTE — Patient Instructions (Signed)
Motrin 400 - 800 mg recommended TID. (Over the counter Motrin, Advil, or Generic Ibuprofen 200 mg tablets. 2- 4 tablets by mouth 3 times a day. This equals a prescription strength dose.)

## 2020-08-21 NOTE — Progress Notes (Signed)
Cathy Mullins, Cathy Mullins at [facility] [place], [address]  Phone: [phone]  FAX: [phone]  Cathy Mullins - 75 y.o. female  MRN [id number]  Date of Birth: 01-01-1946  Date: 08/21/2020  PCP: Cathy Sanders, Mullins  Referral: Cathy Sanders, Mullins  Chief Complaint  Patient presents with  . Back Pain    Radiates down leg  . Fall    08/08/2020-Seen by [facility] on 08/12/20    This visit occurred during the SARS-CoV-2 public health emergency.  Safety protocols were in place, including screening questions prior to the visit, additional usage of staff PPE, and extensive cleaning of exam room while observing appropriate contact time as indicated for disinfecting solutions.   Subjective:   Cathy Mullins is a 75 y.o. very pleasant female patient with Body mass index is 33.37 kg/m. who presents with the following:  S/p fall 08/08/2020:  She describes an incident where she fell and landed on top of 2 baskets and the handles struck her, and she has developed significant pain on the right side of her body.  She additionally hit other areas in had some significant pain throughout much of her body, predominantly on the right.  Does complain of some rib pain, back pain.  She also had some pain at the leg and to a lesser extent some in her upper extremities.  Much of this has resolved with the exception of her back pain.  She has had some radiculopathy through the posterior aspect of her leg on the right.  She also does have some central continued pain.  [facility] went to [facility] on August 12, 2020 and she was evaluated there by one of their physicians assistants.  I do not have their original radiographs, but I do have some printed copies.  There is a small amount of anterolisthesis at L4-5.  She also does have some relatively diffuse degenerative disc disease as well as some facet  arthropathy which would be characterized as mild in character.  This is the worst at approximately the level of T11 and L1.  No fractures appreciated.  This is from my review of the photocopy films.  She is having some numbness in the lateral aspect of her thigh  She also went to see Cathy Mullins from Podiatry.  Do not have the records from orthopedics, but it sounds as if they gave her some sort of injection for pain and gave her a steroid taper.  Patient Active Problem List   Diagnosis Date Noted  . Peripheral edema 05/30/2020  . Dark stools 12/21/2019  . Neuralgia of left glossopharyngeal nerve 09/27/2019  . Nausea and vomiting 09/05/2019  . Chronic left ear pain 09/05/2019  . Folliculitis and perifolliculitis 69/67/8938  . Near syncope 02/16/2018  . Mitral valve prolapse 01/31/2018  . Migraine with aura and without status migrainosus 07/26/2017  . CKD stage 3 due to type 2 diabetes mellitus ([place]) 02/11/2017  . Bilateral knee pain 01/08/2016  . Spinal stenosis of lumbar region 12/03/2014  . Counseling regarding end of life decision making 07/09/2014  . Lichen sclerosus et atrophicus of the vulva 03/01/2011  . Acute left-sided low back pain with left-sided sciatica 10/02/2010  . Palpitations 01/21/2010  . INSOMNIA, CHRONIC 02/18/2009  . OSTEOARTHRITIS, HANDS, BILATERAL 02/18/2009  . DM (diabetes mellitus), type 2 with renal complications ([place]) 05/25/5101  . Hypertension associated with diabetes (  [place]) 05/01/2007  . Fibromyalgia 05/01/2007  . Hx-TIA (transient ischemic attack) 05/01/2007  . Hyperlipidemia associated with type 2 diabetes mellitus ([place]) 11/18/2006  . GLAUCOMA NOS 11/18/2006  . ALLERGIC RHINITIS 11/18/2006  . COPD 11/18/2006  . GERD 11/18/2006  . COLONIC POLYPS, HX OF 11/18/2006    Past Medical History:  Diagnosis Date  . Allergy   . Asthma with COPD ([place])   . Depression    daughter was very sick, depression began during this period of time   . Diabetes  mellitus   . Esophagitis   . Fibromyalgia   . GERD (gastroesophageal reflux disease)   . Glaucoma   . History of colon polyps   . HTN (hypertension)   . Hyperlipidemia   . Lichen sclerosus   . Migraine   . Perforating folliculitis   . Spinal stenosis   . Tachycardia   . TIA (transient ischemic attack)     Past Surgical History:  Procedure Laterality Date  . ABDOMINAL HYSTERECTOMY     right ovary remaining  . APPENDECTOMY    . BREAST CYST ASPIRATION Bilateral   . BREAST EXCISIONAL BIOPSY Right 1982   NEG  . BREAST SURGERY     right(benign)  . CARPAL TUNNEL RELEASE    . CESAREAN SECTION    . EPIDURAL BLOCK INJECTION  2013  . KNEE ARTHROSCOPY W/ MENISCAL REPAIR     left  . LIPOMA EXCISION     2 times left leg  . lumpectomy    . TONSILLECTOMY    . TRIGGER FINGER RELEASE      Family History  Problem Relation Age of Onset  . Cancer Mother        breast  . Stroke Mother   . Valvular heart disease Mother   . Breast cancer Mother 56  . Alzheimer's disease Mother   . Kidney disease Father   . Stroke Father   . Coronary artery disease Father   . Parkinson's disease Father   . Diabetes Brother   . Alzheimer's disease Maternal Grandmother   . Diabetes Maternal Aunt   . Alzheimer's disease Maternal Aunt   . Alzheimer's disease Maternal Aunt   . Colon cancer Neg Hx   . Pancreatic cancer Neg Hx   . Stomach cancer Neg Hx      Review of Systems is noted in the HPI, as appropriate   Objective:   BP 130/74   Pulse 62   Temp 98.4 F (36.9 C) (Temporal)   Ht 5' 1.5" (1.562 m)   Wt 179 lb 8 oz (81.4 kg)   SpO2 97%   BMI 33.37 kg/m    Range of motion at  the waist: Flexion, extension, lateral bending and rotation: She has limitation in forward flexion and notable limitation in extension.  Lateral bending and rotational movements are also limited some.  No echymosis or edema Rises to examination table with mild difficulty Gait: minimally  antalgic  Inspection/Deformity: N Paraspinus Tenderness: She has central tenderness over the lumbar spine at L4-L5. She also has diffuse paraspinous muscular tenderness from L1-S1 bilaterally  B Ankle Dorsiflexion (L5,4): 5/5 B Great Toe Dorsiflexion (L5,4): 5/5 Heel Walk (L5): WNL Toe Walk (S1): WNL Rise/Squat (L4): WNL, mild pain  SENSORY B Medial Foot (L4): WNL B Dorsum (L5): WNL B Lateral (S1): WNL At the anterior thigh the patient has decreased sensation to both pinprick as well as soft touch  REFLEXES Knee (L4): 2+ Ankle (S1): 2+  B SLR, seated:  neg B SLR, supine: neg B FABER: neg B Reverse FABER: neg B Greater Troch: Mild to moderately tender on the right B Log Roll: neg B Sciatic Notch: NT   Radiology: Reviewed above  Assessment and Plan:     ICD-10-CM   1. Acute bilateral low back pain with right-sided sciatica  M54.41 MR Lumbar Spine Wo Contrast  2. Accidental fall, initial encounter  W19.XXXA MR Lumbar Spine Wo Contrast  3. Numbness of right anterior thigh  R20.0    My primary worry would be a occult vertebral body fracture.  She has some significant pain, and anatomical location would be concerned for L4-5 L5-S1 vertebral fracture particularly in a 75 year old after a traumatic fall.  Obtain an MRI of the lumbar spine to evaluate for occult fracture.  She certainly does have some radicular symptoms and numbness on the right side, and this can continue to be addressed conservatively if there is no occult fracture.  For now, continue with pain management, Tylenol, tramadol, as well as NSAIDs would be appropriate.  She already has some tramadol at home.  Medications Discontinued During This Encounter  Medication Reason  . predniSONE (DELTASONE) 20 MG tablet Completed Course   Orders Placed This Encounter  Procedures  . MR Lumbar Spine Wo Contrast    Follow-up: No follow-ups on file.  Signed,  Maud Deed. , Mullins   Outpatient Encounter  Medications as of 08/21/2020  Medication Sig  . Accu-Chek Softclix Lancets lancets Use to check blood sugar two times a day.  . albuterol (PROVENTIL HFA;VENTOLIN HFA) 108 (90 BASE) MCG/ACT inhaler 2 puffs every 4 hours as needed for wheezing.  . Alcohol Swabs (B-D SINGLE USE SWABS REGULAR) PADS Use to check blood sugar two times a day  . aspirin 81 MG tablet Take 81 mg by mouth as needed.  . Blood Glucose Calibration (ACCU-CHEK AVIVA) SOLN Use to check controls on glucose meter  . Blood Glucose Monitoring Suppl (ACCU-CHEK AVIVA PLUS) w/Device KIT Use to check blood sugar two times a day  . Cholecalciferol (VITAMIN D3 PO) Take 1,000 Units by mouth daily.  Marland Kitchen desipramine (NORPRAMIN) 25 MG tablet TAKE 1 TABLET AT BEDTIME  . diclofenac sodium (VOLTAREN) 1 % GEL APPLY 2 GRAMS TOPICALLY FOUR TIMES A DAY AS NEEDED  . ferrous sulfate 325 (65 FE) MG tablet Take 325 mg by mouth daily with breakfast.  . finasteride (PROSCAR) 5 MG tablet Take 2.5 mg by mouth daily.  Marland Kitchen glucose blood (ACCU-CHEK AVIVA PLUS) test strip Use to check blood sugar two times a day.  . hydrochlorothiazide (HYDRODIURIL) 25 MG tablet TAKE 1 TABLET EVERY DAY  . latanoprost (XALATAN) 0.005 % ophthalmic solution Place 1 drop into both eyes at bedtime.  . Magnesium 200 MG TABS Take by mouth.  . metFORMIN (GLUCOPHAGE-XR) 500 MG 24 hr tablet TAKE 1 TABLET TWICE DAILY  . metoprolol tartrate (LOPRESSOR) 50 MG tablet TAKE 1 TABLET TWICE DAILY  . Multiple Vitamins-Minerals (CENTRUM ADULTS PO) Take 1 tablet by mouth daily.  Marland Kitchen neomycin-bacitracin-polymyxin (NEOSPORIN) ointment Apply 1 application topically as needed for wound care.   . timolol (TIMOPTIC) 0.5 % ophthalmic solution Place 1 drop into both eyes daily.   . traMADol (ULTRAM) 50 MG tablet Take 1 tablet (50 mg total) by mouth 4 (four) times daily.  . [DISCONTINUED] predniSONE (DELTASONE) 20 MG tablet 3 tabs by mouth daily x 3 days, then 2 tabs by mouth daily x 2 days then 1 tab by mouth  daily x 2  days (Patient not taking: Reported on 06/24/2020)   Facility-Administered Encounter Medications as of 08/21/2020  Medication  . betamethasone acetate-betamethasone sodium phosphate (CELESTONE) injection 3 mg  . betamethasone acetate-betamethasone sodium phosphate (CELESTONE) injection 3 mg  . betamethasone acetate-betamethasone sodium phosphate (CELESTONE) injection 3 mg

## 2020-08-28 ENCOUNTER — Other Ambulatory Visit: Payer: Self-pay

## 2020-08-28 MED ORDER — DESIPRAMINE HCL 25 MG PO TABS: 25.0000 mg | ORAL_TABLET | Freq: Every day | ORAL | 1 refills | Status: DC

## 2020-09-01 MED ORDER — BETAMETHASONE SOD PHOS & ACET 6 (3-3) MG/ML IJ SUSP: 3.0000 mg | Freq: Once | INTRAMUSCULAR | Status: DC

## 2020-09-01 NOTE — Progress Notes (Signed)
   HPI: 75 y.o. female presenting today as a reestablish new patient for evaluation of neuropathy to the bilateral lower extremities.  PMHx DM type II.  Patient states that she refuses to take any oral medication for the neuropathy due to side effect of nightmares.  She experiences numbness tingling burning and redness.  She has been applying topical neuropathic oil with minimal to moderate relief.  She is also been applying diclofenac topical anti-inflammatory.  Past Medical History:  Diagnosis Date  . Allergy   . Asthma with COPD ([place])   . Depression    daughter was very sick, depression began during this period of time   . Diabetes mellitus   . Esophagitis   . Fibromyalgia   . GERD (gastroesophageal reflux disease)   . Glaucoma   . History of colon polyps   . HTN (hypertension)   . Hyperlipidemia   . Lichen sclerosus   . Migraine   . Perforating folliculitis   . Spinal stenosis   . Tachycardia   . TIA (transient ischemic attack)      Physical Exam: General: The patient is alert and oriented x3 in no acute distress.  Dermatology: Skin is warm, dry and supple bilateral lower extremities. Negative for open lesions or macerations.  Vascular: Palpable pedal pulses bilaterally. No edema or erythema noted. Capillary refill within normal limits.  Neurological: Epicritic and protective threshold diminished bilaterally.   Musculoskeletal Exam: Range of motion within normal limits to all pedal and ankle joints bilateral. Muscle strength 5/5 in all groups bilateral.  There is also pain on palpation and range of motion to the right ankle joint consistent with an ankle joint synovitis/capsulitis   Assessment: 1.  Type II DM with peripheral polyneuropathy 2.  Synovitis of right ankle   Plan of Care:  1. Patient evaluated.  2.  Injection of 0.5 cc Celestone Soluspan injected into the right ankle joint 3.  Continue OTC topical neuropathic oil 4.  Continue OTC diclofenac topical 5.   Return to clinic as needed      Edrick Kins, DPM [facility]  Dr. Edrick Kins, DPM    [address], [place] [address]                Office [phone]  Fax [phone]

## 2020-09-08 ENCOUNTER — Other Ambulatory Visit: Payer: Self-pay

## 2020-09-08 ENCOUNTER — Ambulatory Visit
Admission: RE | Admit: 2020-09-08 | Discharge: 2020-09-08 | Disposition: A | Discharge status: Home / Self Care | Payer: Medicare HMO | Source: Ambulatory Visit | Attending: Family Medicine | Admitting: Family Medicine

## 2020-09-08 DIAGNOSIS — M47816 Spondylosis without myelopathy or radiculopathy, lumbar region: Secondary | ICD-10-CM | POA: Diagnosis not present

## 2020-09-08 DIAGNOSIS — M5126 Other intervertebral disc displacement, lumbar region: Secondary | ICD-10-CM | POA: Diagnosis not present

## 2020-09-08 DIAGNOSIS — M5441 Lumbago with sciatica, right side: Secondary | ICD-10-CM | POA: Insufficient documentation

## 2020-09-08 DIAGNOSIS — W19XXXA Unspecified fall, initial encounter: Secondary | ICD-10-CM | POA: Insufficient documentation

## 2020-09-08 DIAGNOSIS — M48061 Spinal stenosis, lumbar region without neurogenic claudication: Secondary | ICD-10-CM | POA: Insufficient documentation

## 2020-09-08 DIAGNOSIS — M545 Low back pain, unspecified: Secondary | ICD-10-CM | POA: Diagnosis not present

## 2020-09-09 ENCOUNTER — Other Ambulatory Visit: Payer: Self-pay | Admitting: Family Medicine

## 2020-09-09 ENCOUNTER — Telehealth: Payer: Self-pay | Admitting: Family Medicine

## 2020-09-09 DIAGNOSIS — R200 Anesthesia of skin: Secondary | ICD-10-CM

## 2020-09-09 DIAGNOSIS — W19XXXA Unspecified fall, initial encounter: Secondary | ICD-10-CM

## 2020-09-09 DIAGNOSIS — M5126 Other intervertebral disc displacement, lumbar region: Secondary | ICD-10-CM

## 2020-09-09 DIAGNOSIS — M5441 Lumbago with sciatica, right side: Secondary | ICD-10-CM

## 2020-09-09 DIAGNOSIS — M48062 Spinal stenosis, lumbar region with neurogenic claudication: Secondary | ICD-10-CM

## 2020-09-09 MED ORDER — PREDNISONE 20 MG PO TABS: ORAL_TABLET | ORAL | 0 refills | Status: DC

## 2020-09-09 NOTE — Telephone Encounter (Signed)
Patient wants to discuss what needs to happen after the mri results she got. Patient would like to know if she needs to make an appt or not. Please advise EM

## 2020-09-09 NOTE — Telephone Encounter (Signed)
Overwhelmed managing complex patients and Covid.  I will call as able.

## 2020-09-12 DIAGNOSIS — M4716 Other spondylosis with myelopathy, lumbar region: Secondary | ICD-10-CM | POA: Diagnosis not present

## 2020-09-15 DIAGNOSIS — H353131 Nonexudative age-related macular degeneration, bilateral, early dry stage: Secondary | ICD-10-CM | POA: Diagnosis not present

## 2020-09-15 DIAGNOSIS — H401131 Primary open-angle glaucoma, bilateral, mild stage: Secondary | ICD-10-CM | POA: Diagnosis not present

## 2020-09-29 DIAGNOSIS — M4716 Other spondylosis with myelopathy, lumbar region: Secondary | ICD-10-CM | POA: Diagnosis not present

## 2020-09-29 DIAGNOSIS — M5416 Radiculopathy, lumbar region: Secondary | ICD-10-CM | POA: Diagnosis not present

## 2020-10-09 ENCOUNTER — Telehealth: Payer: Self-pay | Admitting: Family Medicine

## 2020-10-09 NOTE — Progress Notes (Signed)
°  Chronic Care Management   Note  10/09/2020 Name: Cathy Mullins MRN: [id number] DOB: Dec 18, 1945  Cathy Mullins is a 75 y.o. year old female who is a primary care patient of Bedsole, Amy E, MD. I reached out to Cathy Mullins by phone today in response to a referral sent by Ms. Evangeline Gula Mceachern's PCP, Jinny Sanders, MD.   [name] was given information about Chronic Care Management services today including:  1. CCM service includes personalized support from designated clinical staff supervised by her physician, including individualized plan of care and coordination with other care providers 2. 24/7 contact phone numbers for assistance for urgent and routine care needs. 3. Service will only be billed when office clinical staff spend 20 minutes or more in a month to coordinate care. 4. Only one practitioner may furnish and bill the service in a calendar month. 5. The patient may stop CCM services at any time (effective at the end of the month) by phone call to the office staff.   Patient agreed to services and verbal consent obtained.   Follow up plan:   [place]

## 2020-10-09 NOTE — Progress Notes (Signed)
  Chronic Care Management   Outreach Note  10/09/2020 Name: Cathy Mullins MRN: [id number] DOB: [birth year]  Referred by: Jinny Sanders, MD Reason for referral : Chronic Care Management   An unsuccessful telephone outreach was attempted today. The patient was referred to the pharmacist for assistance with care management and care coordination.   Follow Up Plan:   Cathy Mullins  Upstream Scheduler

## 2020-10-21 ENCOUNTER — Telehealth: Payer: Self-pay | Admitting: Family Medicine

## 2020-10-21 NOTE — Progress Notes (Signed)
  Chronic Care Management   Outreach Note  10/21/2020 Name: Cathy Mullins MRN: [id number] DOB: 08-05-1946  Referred by: Jinny Sanders, MD Reason for referral : No chief complaint on file.   An unsuccessful telephone outreach was attempted today. The patient was referred to the pharmacist for assistance with care management and care coordination.   Follow Up Plan:   Lauretta Grill Upstream Scheduler

## 2020-11-12 ENCOUNTER — Telehealth: Payer: Self-pay

## 2020-11-12 NOTE — Chronic Care Management (AMB) (Addendum)
Chronic Care Management Pharmacy Assistant   Name: RICKETTA COLANTONIO  MRN: [id number] DOB: 04/21/1946.  Reason for Encounter:  CCM/ Initial Questions    Conditions to be addressed/monitored: HTN, HLD, COPD and DMII   Recent office visits:  09/09/2020  Dr.Copland     Started prednisone 20 mg. For 5 days  08/21/2020  Dr.Copland    06/24/2020 Dr.Bedsole PCP F/U  05/30/2020  Dr.Bedsole  Video visit            Started [place] course Prednisone 55m.  Recent consult visits:  09/15/2020  [facility] 08/12/2020  BDaylene Katayama DPM   [facility]  None in previous 6 months  Medications: Outpatient Encounter Medications as of 11/12/2020  Medication Sig Note   Accu-Chek Softclix Lancets lancets Use to check blood sugar two times a day.    albuterol (PROVENTIL HFA;VENTOLIN HFA) 108 (90 BASE) MCG/ACT inhaler 2 puffs every 4 hours as needed for wheezing.    Alcohol Swabs (B-D SINGLE USE SWABS REGULAR) PADS Use to check blood sugar two times a day    aspirin 81 MG tablet Take 81 mg by mouth as needed.    Blood Glucose Calibration (ACCU-CHEK AVIVA) SOLN Use to check controls on glucose meter    Blood Glucose Monitoring Suppl (ACCU-CHEK AVIVA PLUS) w/Device KIT Use to check blood sugar two times a day    Cholecalciferol (VITAMIN D3 PO) Take 1,000 Units by mouth daily.    desipramine (NORPRAMIN) 25 MG tablet Take 1 tablet (25 mg total) by mouth at bedtime.    diclofenac sodium (VOLTAREN) 1 % GEL APPLY 2 GRAMS TOPICALLY FOUR TIMES A DAY AS NEEDED    ferrous sulfate 325 (65 FE) MG tablet Take 325 mg by mouth daily with breakfast.    finasteride (PROSCAR) 5 MG tablet Take 2.5 mg by mouth daily.    glucose blood (ACCU-CHEK AVIVA PLUS) test strip Use to check blood sugar two times a day.    hydrochlorothiazide (HYDRODIURIL) 25 MG tablet TAKE 1 TABLET EVERY DAY    latanoprost (XALATAN) 0.005 % ophthalmic solution Place 1 drop into both eyes at bedtime.    Magnesium  200 MG TABS Take by mouth.    metFORMIN (GLUCOPHAGE-XR) 500 MG 24 hr tablet TAKE 1 TABLET TWICE DAILY    metoprolol tartrate (LOPRESSOR) 50 MG tablet TAKE 1 TABLET TWICE DAILY    Multiple Vitamins-Minerals (CENTRUM ADULTS PO) Take 1 tablet by mouth daily.    neomycin-bacitracin-polymyxin (NEOSPORIN) ointment Apply 1 application topically as needed for wound care.     predniSONE (DELTASONE) 20 MG tablet 2 tabs po daily for 5 days, then 1 tab po daily for 5 days    timolol (TIMOPTIC) 0.5 % ophthalmic solution Place 1 drop into both eyes daily.  09/11/2013: Received from: External Pharmacy   traMADol (ULTRAM) 50 MG tablet Take 1 tablet (50 mg total) by mouth 4 (four) times daily.    Facility-Administered Encounter Medications as of 11/12/2020  Medication   betamethasone acetate-betamethasone sodium phosphate (CELESTONE) injection 3 mg    Lab Results  Component Value Date/Time   HGBA1C 7.3 (H) 06/17/2020 09:55 AM   HGBA1C 7.0 (H) 12/14/2019 10:48 AM   MICROALBUR 1.3 12/21/2019 03:03 PM   MICROALBUR 2.08 (H) 07/17/2010 08:26 PM     BP Readings from Last 3 Encounters:  08/21/20 130/74  06/24/20 120/70  05/30/20 140/84    Patient returned call stating that she was not interested in CCM  services at this time.  Patient's telephone visit on 11/19/2020 at 2:00pm has been cancelled. Debbora Dus notified   Star Rating Drugs: Medication:  Last Fill: Day Supply Metformin 500 XR 09/15/2020 90DS  Follow-Up:  Pharmacist Review  Debbora Dus, CPP notified  Avel Sensor [facility] Clinical Pharmacy Assistant [phone]

## 2020-11-12 NOTE — Telephone Encounter (Signed)
Patient called in wanted to know about stopping the phone calls she is not interested.

## 2020-11-17 ENCOUNTER — Other Ambulatory Visit: Payer: Self-pay | Admitting: Family Medicine

## 2020-11-19 ENCOUNTER — Telehealth: Payer: Self-pay

## 2020-11-19 ENCOUNTER — Telehealth: Payer: Medicare HMO

## 2020-11-19 NOTE — Telephone Encounter (Signed)
Patient appt for CCM on 11/19/20 cancelled per pt request on 11/12/20. (See initial questions telephone note). She will not be enrolled in CCM.  Debbora Dus, PharmD Clinical Pharmacist [facility] [phone]

## 2020-12-16 ENCOUNTER — Telehealth: Payer: Self-pay | Admitting: Family Medicine

## 2020-12-16 DIAGNOSIS — N1831 Chronic kidney disease, stage 3a: Secondary | ICD-10-CM

## 2020-12-16 NOTE — Telephone Encounter (Signed)
-----   Message from Cloyd Stagers, RT sent at 12/01/2020  2:06 PM EDT ----- Regarding: Lab Orders for Friday 5.13.2022 Please place lab orders for Friday 5.13.2022, office visit for physical on  Friday 5.20.2022 Thank you, Dyke Maes RT(R)

## 2020-12-19 ENCOUNTER — Ambulatory Visit: Payer: Medicare HMO

## 2020-12-19 ENCOUNTER — Other Ambulatory Visit (INDEPENDENT_AMBULATORY_CARE_PROVIDER_SITE_OTHER): Payer: Medicare HMO

## 2020-12-19 ENCOUNTER — Other Ambulatory Visit: Payer: Self-pay

## 2020-12-19 DIAGNOSIS — N1831 Chronic kidney disease, stage 3a: Secondary | ICD-10-CM | POA: Diagnosis not present

## 2020-12-19 DIAGNOSIS — E1122 Type 2 diabetes mellitus with diabetic chronic kidney disease: Secondary | ICD-10-CM | POA: Diagnosis not present

## 2020-12-19 LAB — LIPID PANEL
Cholesterol: 263 mg/dL — ABNORMAL HIGH (ref 0–200)
HDL: 58.2 mg/dL (ref >39.00)
NonHDL: 204.74
Total CHOL/HDL Ratio: 5
Triglycerides: 274 mg/dL — ABNORMAL HIGH (ref 0.0–149.0)
VLDL: 54.8 mg/dL — ABNORMAL HIGH (ref 0.0–40.0)

## 2020-12-19 LAB — COMPREHENSIVE METABOLIC PANEL
ALT: 16 U/L (ref 0–35)
AST: 17 U/L (ref 0–37)
Albumin: 4.4 g/dL (ref 3.5–5.2)
Alkaline Phosphatase: 60 U/L (ref 39–117)
BUN: 22 mg/dL (ref 6–23)
CO2: 28 mEq/L (ref 19–32)
Calcium: 10.2 mg/dL (ref 8.4–10.5)
Chloride: 96 mEq/L (ref 96–112)
Creatinine, Ser: 1.14 mg/dL (ref 0.40–1.20)
GFR: 47.37 mL/min — ABNORMAL LOW (ref >60.00)
Glucose, Bld: 155 mg/dL — ABNORMAL HIGH (ref 70–99)
Potassium: 4.6 mEq/L (ref 3.5–5.1)
Sodium: 135 mEq/L (ref 135–145)
Total Bilirubin: 0.4 mg/dL (ref 0.2–1.2)
Total Protein: 7.3 g/dL (ref 6.0–8.3)

## 2020-12-19 LAB — MICROALBUMIN / CREATININE URINE RATIO
Creatinine,U: 117.8 mg/dL
Microalb Creat Ratio: 4.4 mg/g (ref 0.0–30.0)
Microalb, Ur: 5.2 mg/dL — ABNORMAL HIGH (ref 0.0–1.9)

## 2020-12-19 LAB — LDL CHOLESTEROL, DIRECT: Direct LDL: 166 mg/dL

## 2020-12-19 LAB — HEMOGLOBIN A1C: Hgb A1c MFr Bld: 7.6 % — ABNORMAL HIGH (ref 4.6–6.5)

## 2020-12-19 NOTE — Progress Notes (Signed)
No critical labs need to be addressed urgently. We will discuss labs in detail at upcoming office visit.   

## 2020-12-26 ENCOUNTER — Other Ambulatory Visit: Payer: Self-pay

## 2020-12-26 ENCOUNTER — Ambulatory Visit (INDEPENDENT_AMBULATORY_CARE_PROVIDER_SITE_OTHER): Payer: Medicare HMO | Admitting: Family Medicine

## 2020-12-26 VITALS — BP 130/70 | HR 78 | Temp 97.9°F | Ht 61.0 in | Wt 174.0 lb

## 2020-12-26 DIAGNOSIS — E1159 Type 2 diabetes mellitus with other circulatory complications: Secondary | ICD-10-CM

## 2020-12-26 DIAGNOSIS — I152 Hypertension secondary to endocrine disorders: Secondary | ICD-10-CM | POA: Diagnosis not present

## 2020-12-26 DIAGNOSIS — Z0000 Encounter for general adult medical examination without abnormal findings: Secondary | ICD-10-CM

## 2020-12-26 DIAGNOSIS — N1831 Chronic kidney disease, stage 3a: Secondary | ICD-10-CM | POA: Diagnosis not present

## 2020-12-26 DIAGNOSIS — E1122 Type 2 diabetes mellitus with diabetic chronic kidney disease: Secondary | ICD-10-CM

## 2020-12-26 DIAGNOSIS — E785 Hyperlipidemia, unspecified: Secondary | ICD-10-CM

## 2020-12-26 DIAGNOSIS — E1169 Type 2 diabetes mellitus with other specified complication: Secondary | ICD-10-CM

## 2020-12-26 NOTE — Patient Instructions (Addendum)
Increase metfomrin to 2 tabs in AM  And 1 tab at night.  Follow fasting blood sugars.Marland Kitchen goal FBS< 120  Call to set up mammogram when ready.

## 2020-12-26 NOTE — Progress Notes (Signed)
Patient ID: Cathy Mullins, female    DOB: 11-29-1945, 75 y.o.   MRN: [id number]  This visit was conducted in person.  BP 130/70   Pulse 78   Temp 97.9 F (36.6 C) (Temporal)   Ht _0  (1.549 m)   Wt 174 lb (78.9 kg)   SpO2 98%   BMI 32.88 kg/m    CC:  Chief Complaint  Patient presents with   Medicare Wellness    Subjective:   HPI: Cathy Mullins is a 75 y.o. female presenting on 12/26/2020 for Medicare Wellness  The patient presents for annual medicare wellness, complete physical and review of chronic health problems. He/She also has the following acute concerns today: no new issues  I have personally reviewed the Medicare Annual Wellness questionnaire and have noted 1. The patient's medical and social history 2. Their use of alcohol, tobacco or illicit drugs 3. Their current medications and supplements 4. The patient's functional ability including ADL's, fall risks, home safety risks and hearing or visual             impairment. 5. Diet and physical activities 6. Evidence for depression or mood disorders 7.         Updated provider list Cognitive evaluation was performed and recorded on pt medicare questionnaire form. The patients weight, height, BMI and visual acuity have been recorded in the chart   I have made referrals, counseling and provided education to the patient based review of the above and I have provided the pt with a written personalized care plan for preventive services.   Documentation of this information was scanned into the electronic record under the media tab.   Advance directives and end of life planning reviewed in detail with patient and documented in EMR. Patient given handout on advance care directives if needed. HCPOA and living will updated if needed.  Flowsheet Row Clinical Support from 12/18/2019 in [facility] at [place]  PHQ-2 Total Score 0      Fall Risk  12/26/2020 12/18/2019 02/16/2018 02/14/2018 02/04/2017  Falls in the past  year? 1 0 No No Yes  Comment - - - - pt lost balance and fell on front porch  Number falls in past yr: 1 0 - - 1  Injury with Fall? 1 0 - - No  Risk for fall due to : - Medication side effect - - -  Follow up - Falls evaluation completed;Falls prevention discussed - - -     Hearing Screening   _1  _2  _3  _4  _5  _6  _7  _8  _9   Right ear:  _10 Left ear:  _11 Vision Screening Comments: Last eye exam May 2022 @ [facility]      CKD: GFR 47  Diabetes:  Worsening control  Over last few years trending up.. not moving as much, prednisone in last 3 months. Lab Results  Component Value Date   HGBA1C 7.6 (H) 12/19/2020  Using medications without difficulties: Hypoglycemic episodes: Hyperglycemic episodes: Feet problems: Blood Sugars averaging: eye exam within last year:  Hypertension:   Well controlled.  BP Readings from Last 3 Encounters:  12/26/20 130/70  08/21/20 130/74  06/24/20 120/70  Using medication without problems or lightheadedness:  none Chest pain with exertion: none Edema: occ Caesar of breath: none Average home BPs: Other issues:  Elevated Cholesterol:   Not at goal. SE to statin, welchol. Lab Results  Component Value Date   CHOL 263 (H) 12/19/2020   HDL 58.20 12/19/2020   LDLCALC 130 (H) 02/04/2017   LDLDIRECT 166.0 12/19/2020   TRIG 274.0 (H) 12/19/2020   CHOLHDL 5 12/19/2020  Using medications without problems: Muscle aches:  Diet compliance: good Exercise: as tolerated Other complaints:       Relevant past medical, surgical, family and social history reviewed and updated as indicated. Interim medical history since our last visit reviewed. Allergies and medications reviewed and updated. Outpatient Medications Prior to Visit  Medication Sig Dispense Refill   Accu-Chek Softclix Lancets lancets Use to check blood sugar two times a day. 200 each 3   albuterol (PROVENTIL HFA;VENTOLIN HFA) 108  (90 BASE) MCG/ACT inhaler 2 puffs every 4 hours as needed for wheezing. 1 Inhaler 0   Alcohol Swabs (B-D SINGLE USE SWABS REGULAR) PADS Use to check blood sugar two times a day 200 each 3   aspirin 81 MG tablet Take 81 mg by mouth as needed.     Blood Glucose Calibration (ACCU-CHEK AVIVA) SOLN Use to check controls on glucose meter 1 each 3   Blood Glucose Monitoring Suppl (ACCU-CHEK AVIVA PLUS) w/Device KIT Use to check blood sugar two times a day 1 kit 0   Cholecalciferol (VITAMIN D3 PO) Take 1,000 Units by mouth daily.     desipramine (NORPRAMIN) 25 MG tablet Take 1 tablet (25 mg total) by mouth at bedtime. 90 tablet 1   diclofenac sodium (VOLTAREN) 1 % GEL APPLY 2 GRAMS TOPICALLY FOUR TIMES A DAY AS NEEDED 300 g 1   ferrous sulfate 325 (65 FE) MG tablet Take 325 mg by mouth daily with breakfast.     finasteride (PROSCAR) 5 MG tablet Take 2.5 mg by mouth daily.     glucose blood (ACCU-CHEK AVIVA PLUS) test strip Use to check blood sugar two times a day. 200 each 3   hydrochlorothiazide (HYDRODIURIL) 25 MG tablet TAKE 1 TABLET EVERY DAY 90 tablet 0   latanoprost (XALATAN) 0.005 % ophthalmic solution Place 1 drop into both eyes at bedtime.     Magnesium 200 MG TABS Take by mouth.     metFORMIN (GLUCOPHAGE-XR) 500 MG 24 hr tablet TAKE 1 TABLET TWICE DAILY 180 tablet 3   metoprolol tartrate (LOPRESSOR) 50 MG tablet TAKE 1 TABLET TWICE DAILY 180 tablet 1   Multiple Vitamins-Minerals (CENTRUM ADULTS PO) Take 1 tablet by mouth daily.     neomycin-bacitracin-polymyxin (NEOSPORIN) ointment Apply 1 application topically as needed for wound care.      timolol (TIMOPTIC) 0.5 % ophthalmic solution Place 1 drop into both eyes daily.      traMADol (ULTRAM) 50 MG tablet Take 1 tablet (50 mg total) by mouth 4 (four) times daily. 120 tablet 3   predniSONE (DELTASONE) 20 MG tablet 2 tabs po daily for 5 days, then 1 tab po daily for 5 days 15 tablet 0   Facility-Administered Medications Prior to Visit   Medication Dose Route Frequency Provider Last Rate Last Admin   betamethasone acetate-betamethasone sodium phosphate (CELESTONE) injection 3 mg  3 mg Intramuscular Once Edrick Kins, DPM         Per HPI unless specifically indicated in ROS section below Review of Systems  Constitutional:  Negative for fatigue and fever.  HENT:  Negative for congestion.   Eyes:  Negative for pain.  Respiratory:  Negative for cough and shortness of breath.   Cardiovascular:  Negative for chest pain, palpitations and leg swelling.  Gastrointestinal:  Negative for abdominal pain.  Genitourinary:  Negative for dysuria and vaginal bleeding.  Musculoskeletal:  Negative for back pain.  Neurological:  Negative for syncope, light-headedness and headaches.  Psychiatric/Behavioral:  Negative for dysphoric mood.   Objective:  BP 130/70   Pulse 78   Temp 97.9 F (36.6 C) (Temporal)   Ht _0  (1.549 m)   Wt 174 lb (78.9 kg)   SpO2 98%   BMI 32.88 kg/m   Wt Readings from Last 3 Encounters:  12/26/20 174 lb (78.9 kg)  08/21/20 179 lb 8 oz (81.4 kg)  06/24/20 177 lb 8 oz (80.5 kg)      Physical Exam Constitutional:      General: She is not in acute distress.    Appearance: Normal appearance. She is well-developed. She is not ill-appearing or toxic-appearing.  HENT:     Head: Normocephalic.     Right Ear: Hearing, tympanic membrane, ear canal and external ear normal.     Left Ear: Hearing, tympanic membrane, ear canal and external ear normal.     Nose: Nose normal.  Eyes:     General: Lids are normal. Lids are everted, no foreign bodies appreciated.     Conjunctiva/sclera: Conjunctivae normal.     Pupils: Pupils are equal, round, and reactive to light.  Neck:     Thyroid: No thyroid mass or thyromegaly.     Vascular: No carotid bruit.     Trachea: Trachea normal.  Cardiovascular:     Rate and Rhythm: Normal rate and regular rhythm.     Heart sounds: Normal heart sounds, S1 normal and S2 normal.  No murmur heard.   No gallop.  Pulmonary:     Effort: Pulmonary effort is normal. No respiratory distress.     Breath sounds: Normal breath sounds. No wheezing, rhonchi or rales.  Abdominal:     General: Bowel sounds are normal. There is no distension or abdominal bruit.     Palpations: Abdomen is soft. There is no fluid wave or mass.     Tenderness: There is no abdominal tenderness. There is no guarding or rebound.     Hernia: No hernia is present.  Musculoskeletal:     Cervical back: Normal range of motion and neck supple.  Lymphadenopathy:     Cervical: No cervical adenopathy.  Skin:    General: Skin is warm and dry.     Findings: No rash.  Neurological:     Mental Status: She is alert.     Cranial Nerves: No cranial nerve deficit.     Sensory: No sensory deficit.  Psychiatric:        Mood and Affect: Mood is not anxious or depressed.        Speech: Speech normal.        Behavior: Behavior normal. Behavior is cooperative.        Judgment: Judgment normal.      Results for orders placed or performed in visit on 12/19/20  Microalbumin / creatinine urine ratio  Result Value Ref Range   Microalb, Ur 5.2 (H) 0.0 - 1.9 mg/dL   Creatinine,U 117.8 mg/dL   Microalb Creat Ratio 4.4 0.0 - 30.0 mg/g  Comprehensive metabolic panel  Result Value Ref Range   Sodium 135 135 - 145 mEq/L   Potassium 4.6 3.5 - 5.1 mEq/L   Chloride 96 96 - 112 mEq/L   CO2 28 19 - 32 mEq/L   Glucose, Bld 155 (H) 70 - 99 mg/dL  BUN 22 6 - 23 mg/dL   Creatinine, Ser 1.14 0.40 - 1.20 mg/dL   Total Bilirubin 0.4 0.2 - 1.2 mg/dL   Alkaline Phosphatase 60 39 - 117 U/L   AST 17 0 - 37 U/L   ALT 16 0 - 35 U/L   Total Protein 7.3 6.0 - 8.3 g/dL   Albumin 4.4 3.5 - 5.2 g/dL   GFR 47.37 (L) >60.00 mL/min   Calcium 10.2 8.4 - 10.5 mg/dL  Lipid panel  Result Value Ref Range   Cholesterol 263 (H) 0 - 200 mg/dL   Triglycerides 274.0 (H) 0.0 - 149.0 mg/dL   HDL 58.20 >39.00 mg/dL   VLDL 54.8 (H) 0.0 - 40.0  mg/dL   Total CHOL/HDL Ratio 5    NonHDL 204.74   Hemoglobin A1c  Result Value Ref Range   Hgb A1c MFr Bld 7.6 (H) 4.6 - 6.5 %  LDL cholesterol, direct  Result Value Ref Range   Direct LDL 166.0 mg/dL    This visit occurred during the SARS-CoV-2 public health emergency.  Safety protocols were in place, including screening questions prior to the visit, additional usage of staff PPE, and extensive cleaning of exam room while observing appropriate contact time as indicated for disinfecting solutions.   COVID 19 screen:  No recent travel or known exposure to COVID19 The patient denies respiratory symptoms of COVID 19 at this time. The importance of social distancing was discussed today.   Assessment and Plan The patient's preventative maintenance and recommended screening tests for an annual wellness exam were reviewed in full today. Brought up to date unless services declined.  Counselled on the importance of diet, exercise, and its role in overall health and mortality. The patient's FH and SH was reviewed, including their home life, tobacco status, and drug and alcohol status.   DXA: 12/2015 nml, repeat in 5 years.. she is not interested in bone density Mammo: nml 08/15/19, mother with breast cancer PAP/DVE: not indicated s/p abdominal hysterectomy, right ovary remaining. Sees Dr. Kennon Rounds for GYN for lichen sclerosis but has not seen in a while. No family history of ovarian cancer. She has chosen to stop DVEs.   Vaccines:Up to date with PNA 13, 23 and planning shingles vaccine, tdap due.   Has received COVID vaccine Colon cancer screening: colonoscopy  Nml,2015. Dr. Deatra Ina, repeat in 10 years.   Hep C:  done Nonsmoker  Urine microalbumin: due   Problem List Items Addressed This Visit     DM (diabetes mellitus), type 2 with renal complications (HCC)    Chronic , worsening control. Increase metfomrin to 2 tabs in AM  And 1 tab at night.  Follow fasting blood sugars.Marland Kitchen goal FBS< 120        Hyperlipidemia associated with type 2 diabetes mellitus ([place])    Not at goal. Statin intolerant, welchol.       Hypertension associated with diabetes (HCC)    Stable, chronic.  Continue current medication.          Other Visit Diagnoses     Medicare annual wellness visit, subsequent    -  Primary        Eliezer Lofts, MD

## 2021-01-06 ENCOUNTER — Ambulatory Visit: Payer: Medicare HMO

## 2021-01-07 ENCOUNTER — Other Ambulatory Visit: Payer: Self-pay | Admitting: Family Medicine

## 2021-01-07 NOTE — Telephone Encounter (Signed)
Pharmacy requests refill on: Metformin 500 mg 24 hr   LAST REFILL: 03/17/2020 (Q-180, R-3)  LAST OV: 12/26/2020 NEXT OV: Not Scheduled  PHARMACY: [facility]

## 2021-01-20 DIAGNOSIS — H26491 Other secondary cataract, right eye: Secondary | ICD-10-CM | POA: Diagnosis not present

## 2021-01-30 ENCOUNTER — Other Ambulatory Visit: Payer: Self-pay | Admitting: Family Medicine

## 2021-02-09 NOTE — Assessment & Plan Note (Signed)
Chronic , worsening control. Increase metfomrin to 2 tabs in AM  And 1 tab at night.  Follow fasting blood sugars.Marland Kitchen goal FBS< 120

## 2021-02-09 NOTE — Assessment & Plan Note (Signed)
Not at goal. Statin intolerant, welchol.

## 2021-02-09 NOTE — Assessment & Plan Note (Signed)
Stable, chronic.  Continue current medication.    

## 2021-02-13 DIAGNOSIS — M25511 Pain in right shoulder: Secondary | ICD-10-CM | POA: Diagnosis not present

## 2021-03-04 DIAGNOSIS — M5412 Radiculopathy, cervical region: Secondary | ICD-10-CM | POA: Diagnosis not present

## 2021-03-04 DIAGNOSIS — M25611 Stiffness of right shoulder, not elsewhere classified: Secondary | ICD-10-CM | POA: Diagnosis not present

## 2021-03-04 DIAGNOSIS — M25511 Pain in right shoulder: Secondary | ICD-10-CM | POA: Diagnosis not present

## 2021-03-20 ENCOUNTER — Other Ambulatory Visit: Payer: Self-pay | Admitting: Family Medicine

## 2021-03-20 MED ORDER — TRAMADOL HCL 50 MG PO TABS: 50.0000 mg | ORAL_TABLET | Freq: Four times a day (QID) | ORAL | 3 refills | Status: DC

## 2021-03-20 NOTE — Telephone Encounter (Signed)
Refill request Tramadol Last refill 08/15/20 #120/3 Last office visit 12/26/20 No upcoming appointment scheduled

## 2021-03-20 NOTE — Telephone Encounter (Signed)
  Encourage patient to contact the pharmacy for refills or they can request refills through [facility]:  Please schedule appointment if longer than 1 year  NEXT APPOINTMENT DATE:  MEDICATION:traMADol (ULTRAM) 50 MG tablet  Is the patient out of medication?   [facility] ([facility]   Let patient know to contact pharmacy at the end of the day to make sure medication is ready.  Please notify patient to allow 48-72 hours to process  CLINICAL FILLS OUT ALL BELOW:   LAST REFILL:  QTY:  REFILL DATE:    OTHER COMMENTS:    Okay for refill?  Please advise

## 2021-03-26 DIAGNOSIS — M25511 Pain in right shoulder: Secondary | ICD-10-CM | POA: Diagnosis not present

## 2021-04-01 DIAGNOSIS — M5412 Radiculopathy, cervical region: Secondary | ICD-10-CM | POA: Diagnosis not present

## 2021-04-01 DIAGNOSIS — M25611 Stiffness of right shoulder, not elsewhere classified: Secondary | ICD-10-CM | POA: Diagnosis not present

## 2021-04-01 DIAGNOSIS — M25511 Pain in right shoulder: Secondary | ICD-10-CM | POA: Diagnosis not present

## 2021-04-11 ENCOUNTER — Other Ambulatory Visit: Payer: Self-pay | Admitting: Gastroenterology

## 2021-06-22 ENCOUNTER — Ambulatory Visit (INDEPENDENT_AMBULATORY_CARE_PROVIDER_SITE_OTHER): Payer: Medicare HMO | Admitting: Gastroenterology

## 2021-06-22 ENCOUNTER — Encounter: Payer: Self-pay | Admitting: Gastroenterology

## 2021-06-22 ENCOUNTER — Other Ambulatory Visit: Payer: Self-pay

## 2021-06-22 VITALS — BP 142/79 | HR 63 | Temp 97.4°F | Ht 60.0 in | Wt 168.0 lb

## 2021-06-22 DIAGNOSIS — R194 Change in bowel habit: Secondary | ICD-10-CM

## 2021-06-22 DIAGNOSIS — K625 Hemorrhage of anus and rectum: Secondary | ICD-10-CM

## 2021-06-22 DIAGNOSIS — R110 Nausea: Secondary | ICD-10-CM | POA: Diagnosis not present

## 2021-06-22 MED ORDER — NA SULFATE-K SULFATE-MG SULF 17.5-3.13-1.6 GM/177ML PO SOLN: 1.0000 | Freq: Once | ORAL | 0 refills | Status: AC

## 2021-06-22 NOTE — Progress Notes (Signed)
Primary Care Physician: Jinny Sanders, MD  Primary Gastroenterologist:  Dr. Lucilla Lame  Chief Complaint  Patient presents with   New Patient (Initial Visit)    HPI: Cathy Mullins is a 75 y.o. female here to discuss whether or not to have a colonoscopy.  The patient had a colonoscopy for adenomatous polyps back in 2007 and then had a repeat in 2011 and a negative colonoscopy for any polyps in 2015.  The patient saw me in the past for nausea vomiting and diarrhea that she had related to ear pain.   She reports bad pain on the left when going to defecate. She has lost 12 lbs in 3 month. The pain last 45 min. The stools are now pencil thin.After the small bowel movements then she gets diarrhea. The vomiting can happen anytime of the day. It is not associated with eating. The patient states that her symptoms are not related to pain now as they were in the past. With her change in bowel habits the patient has also reported that she has had some rectal bleeding.  Past Medical History:  Diagnosis Date   Allergy    Asthma with COPD [facility])    Depression    daughter was very sick, depression began during this period of time    Diabetes mellitus    Esophagitis    Fibromyalgia    GERD (gastroesophageal reflux disease)    Glaucoma    History of colon polyps    HTN (hypertension)    Hyperlipidemia    Lichen sclerosus    Migraine    Perforating folliculitis    Spinal stenosis    Tachycardia    TIA (transient ischemic attack)     Current Outpatient Medications  Medication Sig Dispense Refill   Accu-Chek Softclix Lancets lancets Use to check blood sugar two times a day. 200 each 3   albuterol (PROVENTIL HFA;VENTOLIN HFA) 108 (90 BASE) MCG/ACT inhaler 2 puffs every 4 hours as needed for wheezing. 1 Inhaler 0   Alcohol Swabs (B-D SINGLE USE SWABS REGULAR) PADS Use to check blood sugar two times a day 200 each 3   aspirin 81 MG tablet Take 81 mg by mouth as needed.     Blood Glucose  Calibration (ACCU-CHEK AVIVA) SOLN Use to check controls on glucose meter 1 each 3   Blood Glucose Monitoring Suppl (ACCU-CHEK AVIVA PLUS) w/Device KIT Use to check blood sugar two times a day 1 kit 0   Cholecalciferol (VITAMIN D3 PO) Take 1,000 Units by mouth daily.     desipramine (NORPRAMIN) 25 MG tablet TAKE 1 TABLET (25 MG TOTAL) BY MOUTH AT BEDTIME. 90 tablet 1   diclofenac sodium (VOLTAREN) 1 % GEL APPLY 2 GRAMS TOPICALLY FOUR TIMES A DAY AS NEEDED 300 g 1   ferrous sulfate 325 (65 FE) MG tablet Take 325 mg by mouth daily with breakfast.     finasteride (PROSCAR) 5 MG tablet Take 2.5 mg by mouth daily.     glucose blood (ACCU-CHEK AVIVA PLUS) test strip Use to check blood sugar two times a day. 200 each 3   hydrochlorothiazide (HYDRODIURIL) 25 MG tablet TAKE 1 TABLET EVERY DAY 90 tablet 1   latanoprost (XALATAN) 0.005 % ophthalmic solution Place 1 drop into both eyes at bedtime.     Magnesium 200 MG TABS Take by mouth.     Melatonin 10 MG CAPS Take by mouth.     metFORMIN (GLUCOPHAGE-XR) 500 MG 24 hr tablet  Take 2 tablets by mouth in the morning and 1 tablet at night 270 tablet 1   metoprolol tartrate (LOPRESSOR) 50 MG tablet TAKE 1 TABLET TWICE DAILY 180 tablet 1   Multiple Vitamins-Minerals (CENTRUM ADULTS PO) Take 1 tablet by mouth daily.     neomycin-bacitracin-polymyxin (NEOSPORIN) ointment Apply 1 application topically as needed for wound care.      timolol (TIMOPTIC) 0.5 % ophthalmic solution Place 1 drop into both eyes daily.      traMADol (ULTRAM) 50 MG tablet Take 1 tablet (50 mg total) by mouth 4 (four) times daily. 120 tablet 3   Current Facility-Administered Medications  Medication Dose Route Frequency Provider Last Rate Last Admin   betamethasone acetate-betamethasone sodium phosphate (CELESTONE) injection 3 mg  3 mg Intramuscular Once Daylene Katayama M, DPM        Allergies as of 06/22/2021 - Review Complete 06/22/2021  Allergen Reaction Noted   Amoxicillin-pot  clavulanate     Codeine     Hydrocodone-acetaminophen     Lovastatin     Sulfonamide derivatives     Tizanidine  05/05/2012    ROS:  General: Negative for anorexia, weight loss, fever, chills, fatigue, weakness. ENT: Negative for hoarseness, difficulty swallowing , nasal congestion. CV: Negative for chest pain, angina, palpitations, dyspnea on exertion, peripheral edema.  Respiratory: Negative for dyspnea at rest, dyspnea on exertion, cough, sputum, wheezing.  GI: See history of present illness. GU:  Negative for dysuria, hematuria, urinary incontinence, urinary frequency, nocturnal urination.  Endo: Negative for unusual weight change.    Physical Examination:   BP (!) 142/79   Pulse 63   Temp (!) 97.4 F (36.3 C) (Oral)   Ht 5' (1.524 m)   Wt 168 lb (76.2 kg)   BMI 32.81 kg/m   General: Well-nourished, well-developed in no acute distress.  Eyes: No icterus. Conjunctivae pink. Lungs: Clear to auscultation bilaterally. Non-labored. Heart: Regular rate and rhythm, no murmurs rubs or gallops.  Abdomen: Bowel sounds are normal, nontender, nondistended, no hepatosplenomegaly or masses, no abdominal bruits or hernia , no rebound or guarding.   Extremities: No lower extremity edema. No clubbing or deformities. Neuro: Alert and oriented x 3.  Grossly intact. Skin: Warm and dry, no jaundice.   Psych: Alert and cooperative, normal mood and affect.  Labs:    Imaging Studies: No results found.  Assessment and Plan:   Cathy Mullins is a 75 y.o. y/o female who comes in today with a history of having polyps and is in need of a repeat colonoscopy.  She also has been having some nausea with vomiting, weight loss of 12 pounds in the last 3 months without trying and abdominal pain.  Is also had a significant change in bowel habits with constipation and diarrhea.  The patient will be set up for an EGD and colonoscopy for her nausea vomiting and weight loss in addition to her history of  polyps. The patient also reports that she's had some rectal bleeding. The patient has been explained the plan and agrees with it.     Lucilla Lame, MD. [facility]    Note: This dictation was prepared with Dragon dictation along with smaller phrase technology. Any transcriptional errors that result from this process are unintentional.

## 2021-06-22 NOTE — H&P (View-Only) (Signed)
Primary Care Physician: Jinny Sanders, MD  Primary Gastroenterologist:  Dr. Lucilla Lame  Chief Complaint  Patient presents with   New Patient (Initial Visit)    HPI: Cathy Mullins is a 75 y.o. female here to discuss whether or not to have a colonoscopy.  The patient had a colonoscopy for adenomatous polyps back in 2007 and then had a repeat in 2011 and a negative colonoscopy for any polyps in 2015.  The patient saw me in the past for nausea vomiting and diarrhea that she had related to ear pain.   She reports bad pain on the left when going to defecate. She has lost 12 lbs in 3 month. The pain last 45 min. The stools are now pencil thin.After the small bowel movements then she gets diarrhea. The vomiting can happen anytime of the day. It is not associated with eating. The patient states that her symptoms are not related to pain now as they were in the past. With her change in bowel habits the patient has also reported that she has had some rectal bleeding.  Past Medical History:  Diagnosis Date   Allergy    Asthma with COPD [facility])    Depression    daughter was very sick, depression began during this period of time    Diabetes mellitus    Esophagitis    Fibromyalgia    GERD (gastroesophageal reflux disease)    Glaucoma    History of colon polyps    HTN (hypertension)    Hyperlipidemia    Lichen sclerosus    Migraine    Perforating folliculitis    Spinal stenosis    Tachycardia    TIA (transient ischemic attack)     Current Outpatient Medications  Medication Sig Dispense Refill   Accu-Chek Softclix Lancets lancets Use to check blood sugar two times a day. 200 each 3   albuterol (PROVENTIL HFA;VENTOLIN HFA) 108 (90 BASE) MCG/ACT inhaler 2 puffs every 4 hours as needed for wheezing. 1 Inhaler 0   Alcohol Swabs (B-D SINGLE USE SWABS REGULAR) PADS Use to check blood sugar two times a day 200 each 3   aspirin 81 MG tablet Take 81 mg by mouth as needed.     Blood Glucose  Calibration (ACCU-CHEK AVIVA) SOLN Use to check controls on glucose meter 1 each 3   Blood Glucose Monitoring Suppl (ACCU-CHEK AVIVA PLUS) w/Device KIT Use to check blood sugar two times a day 1 kit 0   Cholecalciferol (VITAMIN D3 PO) Take 1,000 Units by mouth daily.     desipramine (NORPRAMIN) 25 MG tablet TAKE 1 TABLET (25 MG TOTAL) BY MOUTH AT BEDTIME. 90 tablet 1   diclofenac sodium (VOLTAREN) 1 % GEL APPLY 2 GRAMS TOPICALLY FOUR TIMES A DAY AS NEEDED 300 g 1   ferrous sulfate 325 (65 FE) MG tablet Take 325 mg by mouth daily with breakfast.     finasteride (PROSCAR) 5 MG tablet Take 2.5 mg by mouth daily.     glucose blood (ACCU-CHEK AVIVA PLUS) test strip Use to check blood sugar two times a day. 200 each 3   hydrochlorothiazide (HYDRODIURIL) 25 MG tablet TAKE 1 TABLET EVERY DAY 90 tablet 1   latanoprost (XALATAN) 0.005 % ophthalmic solution Place 1 drop into both eyes at bedtime.     Magnesium 200 MG TABS Take by mouth.     Melatonin 10 MG CAPS Take by mouth.     metFORMIN (GLUCOPHAGE-XR) 500 MG 24 hr tablet  Take 2 tablets by mouth in the morning and 1 tablet at night 270 tablet 1   metoprolol tartrate (LOPRESSOR) 50 MG tablet TAKE 1 TABLET TWICE DAILY 180 tablet 1   Multiple Vitamins-Minerals (CENTRUM ADULTS PO) Take 1 tablet by mouth daily.     neomycin-bacitracin-polymyxin (NEOSPORIN) ointment Apply 1 application topically as needed for wound care.      timolol (TIMOPTIC) 0.5 % ophthalmic solution Place 1 drop into both eyes daily.      traMADol (ULTRAM) 50 MG tablet Take 1 tablet (50 mg total) by mouth 4 (four) times daily. 120 tablet 3   Current Facility-Administered Medications  Medication Dose Route Frequency Provider Last Rate Last Admin   betamethasone acetate-betamethasone sodium phosphate (CELESTONE) injection 3 mg  3 mg Intramuscular Once Daylene Katayama M, DPM        Allergies as of 06/22/2021 - Review Complete 06/22/2021  Allergen Reaction Noted   Amoxicillin-pot  clavulanate     Codeine     Hydrocodone-acetaminophen     Lovastatin     Sulfonamide derivatives     Tizanidine  05/05/2012    ROS:  General: Negative for anorexia, weight loss, fever, chills, fatigue, weakness. ENT: Negative for hoarseness, difficulty swallowing , nasal congestion. CV: Negative for chest pain, angina, palpitations, dyspnea on exertion, peripheral edema.  Respiratory: Negative for dyspnea at rest, dyspnea on exertion, cough, sputum, wheezing.  GI: See history of present illness. GU:  Negative for dysuria, hematuria, urinary incontinence, urinary frequency, nocturnal urination.  Endo: Negative for unusual weight change.    Physical Examination:   BP (!) 142/79   Pulse 63   Temp (!) 97.4 F (36.3 C) (Oral)   Ht 5' (1.524 m)   Wt 168 lb (76.2 kg)   BMI 32.81 kg/m   General: Well-nourished, well-developed in no acute distress.  Eyes: No icterus. Conjunctivae pink. Lungs: Clear to auscultation bilaterally. Non-labored. Heart: Regular rate and rhythm, no murmurs rubs or gallops.  Abdomen: Bowel sounds are normal, nontender, nondistended, no hepatosplenomegaly or masses, no abdominal bruits or hernia , no rebound or guarding.   Extremities: No lower extremity edema. No clubbing or deformities. Neuro: Alert and oriented x 3.  Grossly intact. Skin: Warm and dry, no jaundice.   Psych: Alert and cooperative, normal mood and affect.  Labs:    Imaging Studies: No results found.  Assessment and Plan:   Cathy Mullins is a 75 y.o. y/o female who comes in today with a history of having polyps and is in need of a repeat colonoscopy.  She also has been having some nausea with vomiting, weight loss of 12 pounds in the last 3 months without trying and abdominal pain.  Is also had a significant change in bowel habits with constipation and diarrhea.  The patient will be set up for an EGD and colonoscopy for her nausea vomiting and weight loss in addition to her history of  polyps. The patient also reports that she's had some rectal bleeding. The patient has been explained the plan and agrees with it.     Lucilla Lame, MD. Marval Regal    Note: This dictation was prepared with Dragon dictation along with smaller phrase technology. Any transcriptional errors that result from this process are unintentional.

## 2021-06-23 ENCOUNTER — Encounter: Payer: Self-pay | Admitting: Gastroenterology

## 2021-06-23 ENCOUNTER — Ambulatory Visit: Payer: Medicare HMO | Admitting: Family Medicine

## 2021-06-27 DIAGNOSIS — R194 Change in bowel habit: Secondary | ICD-10-CM | POA: Diagnosis not present

## 2021-06-27 DIAGNOSIS — R110 Nausea: Secondary | ICD-10-CM | POA: Diagnosis not present

## 2021-06-27 DIAGNOSIS — K625 Hemorrhage of anus and rectum: Secondary | ICD-10-CM | POA: Diagnosis not present

## 2021-06-29 ENCOUNTER — Ambulatory Visit: Payer: Medicare HMO | Admitting: Anesthesiology

## 2021-06-29 ENCOUNTER — Encounter: Payer: Self-pay | Admitting: Gastroenterology

## 2021-06-29 ENCOUNTER — Ambulatory Visit
Admission: RE | Admit: 2021-06-29 | Discharge: 2021-06-29 | Disposition: A | Discharge status: Home / Self Care | Payer: Medicare HMO | Attending: Gastroenterology | Admitting: Gastroenterology

## 2021-06-29 ENCOUNTER — Other Ambulatory Visit: Payer: Self-pay

## 2021-06-29 ENCOUNTER — Encounter
Admission: RE | Disposition: A | Discharge status: Home / Self Care | Payer: Self-pay | Source: Home / Self Care | Attending: Gastroenterology

## 2021-06-29 DIAGNOSIS — K625 Hemorrhage of anus and rectum: Secondary | ICD-10-CM

## 2021-06-29 DIAGNOSIS — K921 Melena: Secondary | ICD-10-CM | POA: Diagnosis not present

## 2021-06-29 DIAGNOSIS — I10 Essential (primary) hypertension: Secondary | ICD-10-CM | POA: Diagnosis not present

## 2021-06-29 DIAGNOSIS — R194 Change in bowel habit: Secondary | ICD-10-CM

## 2021-06-29 DIAGNOSIS — Z6832 Body mass index (BMI) 32.0-32.9, adult: Secondary | ICD-10-CM | POA: Insufficient documentation

## 2021-06-29 DIAGNOSIS — E669 Obesity, unspecified: Secondary | ICD-10-CM | POA: Diagnosis not present

## 2021-06-29 DIAGNOSIS — Z86010 Personal history of colonic polyps: Secondary | ICD-10-CM | POA: Insufficient documentation

## 2021-06-29 DIAGNOSIS — K219 Gastro-esophageal reflux disease without esophagitis: Secondary | ICD-10-CM | POA: Diagnosis not present

## 2021-06-29 DIAGNOSIS — Z87891 Personal history of nicotine dependence: Secondary | ICD-10-CM | POA: Insufficient documentation

## 2021-06-29 DIAGNOSIS — K449 Diaphragmatic hernia without obstruction or gangrene: Secondary | ICD-10-CM | POA: Insufficient documentation

## 2021-06-29 DIAGNOSIS — K641 Second degree hemorrhoids: Secondary | ICD-10-CM | POA: Diagnosis not present

## 2021-06-29 DIAGNOSIS — R112 Nausea with vomiting, unspecified: Secondary | ICD-10-CM | POA: Insufficient documentation

## 2021-06-29 DIAGNOSIS — K5900 Constipation, unspecified: Secondary | ICD-10-CM | POA: Insufficient documentation

## 2021-06-29 DIAGNOSIS — R634 Abnormal weight loss: Secondary | ICD-10-CM | POA: Diagnosis not present

## 2021-06-29 DIAGNOSIS — R197 Diarrhea, unspecified: Secondary | ICD-10-CM | POA: Insufficient documentation

## 2021-06-29 DIAGNOSIS — R110 Nausea: Secondary | ICD-10-CM | POA: Diagnosis not present

## 2021-06-29 DIAGNOSIS — K642 Third degree hemorrhoids: Secondary | ICD-10-CM | POA: Insufficient documentation

## 2021-06-29 DIAGNOSIS — E119 Type 2 diabetes mellitus without complications: Secondary | ICD-10-CM | POA: Insufficient documentation

## 2021-06-29 HISTORY — DX: Other complications of anesthesia, initial encounter: T88.59XA

## 2021-06-29 HISTORY — PX: COLONOSCOPY WITH PROPOFOL: SHX5780

## 2021-06-29 HISTORY — PX: ESOPHAGOGASTRODUODENOSCOPY (EGD) WITH PROPOFOL: SHX5813

## 2021-06-29 LAB — GLUCOSE, CAPILLARY
Glucose-Capillary: 128 mg/dL — ABNORMAL HIGH (ref 70–99)
Glucose-Capillary: 145 mg/dL — ABNORMAL HIGH (ref 70–99)

## 2021-06-29 SURGERY — ESOPHAGOGASTRODUODENOSCOPY (EGD) WITH PROPOFOL
Anesthesia: General | Site: Throat

## 2021-06-29 SURGERY — ESOPHAGOGASTRODUODENOSCOPY (EGD) WITH PROPOFOL
Anesthesia: Choice

## 2021-06-29 MED ORDER — LACTATED RINGERS IV SOLN: INTRAVENOUS | Status: DC

## 2021-06-29 MED ORDER — SODIUM CHLORIDE 0.9 % IV SOLN: INTRAVENOUS | Status: DC

## 2021-06-29 MED ORDER — LIDOCAINE HCL (CARDIAC) PF 100 MG/5ML IV SOSY
PREFILLED_SYRINGE | INTRAVENOUS | Status: DC | PRN
  Administered 2021-06-29: 60 mg via INTRAVENOUS

## 2021-06-29 MED ORDER — STERILE WATER FOR IRRIGATION IR SOLN
Status: DC | PRN
  Administered 2021-06-29: 250 mL

## 2021-06-29 MED ORDER — GLYCOPYRROLATE 0.2 MG/ML IJ SOLN
INTRAMUSCULAR | Status: DC | PRN
  Administered 2021-06-29: .1 mg via INTRAVENOUS

## 2021-06-29 MED ORDER — PROPOFOL 10 MG/ML IV BOLUS
INTRAVENOUS | Status: DC | PRN
  Administered 2021-06-29: 20 mg via INTRAVENOUS
  Administered 2021-06-29 (×3): 30 mg via INTRAVENOUS
  Administered 2021-06-29: 70 mg via INTRAVENOUS
  Administered 2021-06-29 (×2): 30 mg via INTRAVENOUS
  Administered 2021-06-29: 40 mg via INTRAVENOUS

## 2021-06-29 SURGICAL SUPPLY — 9 items
BLOCK BITE 60FR ADLT L/F GRN (MISCELLANEOUS) ×3 IMPLANT
GOWN CVR UNV OPN BCK APRN NK (MISCELLANEOUS) ×8 IMPLANT
GOWN ISOL THUMB LOOP REG UNIV (MISCELLANEOUS) ×12
KIT PRC NS LF DISP ENDO (KITS) ×4 IMPLANT
KIT PROCEDURE OLYMPUS (KITS) ×6
MANIFOLD NEPTUNE II (INSTRUMENTS) ×6 IMPLANT
MARKER SPOT ENDO TATTOO 5ML (MISCELLANEOUS) IMPLANT
SPOT EX ENDOSCOPIC TATTOO (MISCELLANEOUS)
WATER STERILE IRR 250ML POUR (IV SOLUTION) ×6 IMPLANT

## 2021-06-29 NOTE — Op Note (Signed)
[facility] Gastroenterology Patient Name: Cathy Mullins Procedure Date: 06/29/2021 7:45 AM MRN: [id number] Account #: [id number] Date of Birth: [birth year] Admit Type: Outpatient Age: 75 Room: [facility] OR ROOM 01 Gender: Female Note Status: Finalized Instrument Name: [id number] Procedure:             Upper GI endoscopy Indications:           Nausea with vomiting Providers:             Lucilla Lame MD, MD Referring MD:          Jinny Sanders MD, MD (Referring MD) Medicines:             Propofol per Anesthesia Complications:         No immediate complications. Procedure:             Pre-Anesthesia Assessment:                        - Prior to the procedure, a History and Physical was                         performed, and patient medications and allergies were                         reviewed. The patient's tolerance of previous                         anesthesia was also reviewed. The risks and benefits                         of the procedure and the sedation options and risks                         were discussed with the patient. All questions were                         answered, and informed consent was obtained. Prior                         Anticoagulants: The patient has taken no previous                         anticoagulant or antiplatelet agents. ASA Grade                         Assessment: II - A patient with mild systemic disease.                         After reviewing the risks and benefits, the patient                         was deemed in satisfactory condition to undergo the                         procedure.                        After obtaining informed consent, the endoscope was  passed under direct vision. Throughout the procedure,                         the patient's blood pressure, pulse, and oxygen                         saturations were monitored continuously. The was                         introduced through the  mouth, and advanced to the                         second part of duodenum. The upper GI endoscopy was                         accomplished without difficulty. The patient tolerated                         the procedure well. Findings:      A small hiatal hernia was present.      The stomach was normal.      The examined duodenum was normal. Impression:            - Small hiatal hernia.                        - Normal stomach.                        - Normal examined duodenum.                        - No specimens collected. Recommendation:        - Discharge patient to home.                        - Resume previous diet.                        - Continue present medications.                        - Perform a colonoscopy today. Procedure Code(s):     --- Professional ---                        [id number], Esophagogastroduodenoscopy, flexible,                         transoral; diagnostic, including collection of                         specimen(s) by brushing or washing, when performed                         (separate procedure) Diagnosis Code(s):     --- Professional ---                        R11.2, Nausea with vomiting, unspecified CPT copyright 2019 American Medical Association. All rights reserved. The codes documented in this report are preliminary and upon coder review may  be revised to meet current compliance  requirements. Lucilla Lame MD, MD 06/29/2021 8:02:43 AM This report has been signed electronically. Number of Addenda: 0 Note Initiated On: 06/29/2021 7:45 AM Estimated Blood Loss:  Estimated blood loss: none.      [facility]

## 2021-06-29 NOTE — Interval H&P Note (Signed)
  , MD FACG [address] [place], [place] [address] Phone:[phone] Fax : [phone]  Primary Care Physician:  Bedsole, Amy E, MD Primary Gastroenterologist:  Dr.   Pre-Procedure History & Physical: HPI:  Cathy Mullins is a 75 y.o. female is here for an endoscopy and colonoscopy.   Past Medical History:  Diagnosis Date   Allergy    Asthma with COPD (HCC)    Complication of anesthesia    Difficulty waking after Spinal injection (2006)   Depression    daughter was very sick, depression began during this period of time    Diabetes mellitus    Esophagitis    Fibromyalgia    GERD (gastroesophageal reflux disease)    Glaucoma    History of colon polyps    HTN (hypertension)    Hyperlipidemia    Lichen sclerosus    Migraine    Perforating folliculitis    Spinal stenosis    Tachycardia    TIA (transient ischemic attack)     Past Surgical History:  Procedure Laterality Date   ABDOMINAL HYSTERECTOMY     right ovary remaining   APPENDECTOMY     BREAST CYST ASPIRATION Bilateral    BREAST EXCISIONAL BIOPSY Right 1982   NEG   BREAST SURGERY     right(benign)   CARPAL TUNNEL RELEASE     CESAREAN SECTION     EPIDURAL BLOCK INJECTION  2013   KNEE ARTHROSCOPY W/ MENISCAL REPAIR     left   LIPOMA EXCISION     2 times left leg   lumpectomy     TONSILLECTOMY     TRIGGER FINGER RELEASE      Prior to Admission medications   Medication Sig Start Date End Date Taking? Authorizing Provider  Accu-Chek Softclix Lancets lancets Use to check blood sugar two times a day. 08/20/20  Yes Bedsole, Amy E, MD  albuterol (PROVENTIL HFA;VENTOLIN HFA) 108 (90 BASE) MCG/ACT inhaler 2 puffs every 4 hours as needed for wheezing. 04/17/12  Yes Copland, Spencer, MD  desipramine (NORPRAMIN) 25 MG tablet TAKE 1 TABLET (25 MG TOTAL) BY MOUTH AT BEDTIME. 04/27/21  Yes , , MD  diclofenac sodium (VOLTAREN) 1 % GEL APPLY 2 GRAMS TOPICALLY FOUR TIMES A DAY AS NEEDED  11/24/18  Yes Bedsole, Amy E, MD  ferrous sulfate 325 (65 FE) MG tablet Take 325 mg by mouth daily with breakfast.   Yes [provider]  finasteride (PROSCAR) 5 MG tablet Take 2.5 mg by mouth daily.   Yes [provider]  hydrochlorothiazide (HYDRODIURIL) 25 MG tablet TAKE 1 TABLET EVERY DAY 01/30/21  Yes Bedsole, Amy E, MD  latanoprost (XALATAN) 0.005 % ophthalmic solution Place 1 drop into both eyes at bedtime.   Yes [provider]  Melatonin 10 MG CAPS Take by mouth.   Yes [provider]  metFORMIN (GLUCOPHAGE-XR) 500 MG 24 hr tablet Take 2 tablets by mouth in the morning and 1 tablet at night 01/30/21  Yes Bedsole, Amy E, MD  metoprolol tartrate (LOPRESSOR) 50 MG tablet TAKE 1 TABLET TWICE DAILY 06/09/20  Yes Bedsole, Amy E, MD  Multiple Vitamins-Minerals (CENTRUM ADULTS PO) Take 1 tablet by mouth daily.   Yes [provider]  timolol (TIMOPTIC) 0.5 % ophthalmic solution Place 1 drop into both eyes daily.  09/05/13  Yes [provider]  traMADol (ULTRAM) 50 MG tablet Take 1 tablet (50 mg total) by mouth 4 (four) times daily. 03/20/21  Yes Bedsole, Amy   E, MD  Alcohol Swabs (B-D SINGLE USE SWABS REGULAR) PADS Use to check blood sugar two times a day 08/20/20   Bedsole, Amy E, MD  aspirin 81 MG tablet Take 81 mg by mouth as needed. Patient not taking: Reported on 06/23/2021    [provider]  Blood Glucose Calibration (ACCU-CHEK AVIVA) SOLN Use to check controls on glucose meter 08/20/20   Bedsole, Amy E, MD  Blood Glucose Monitoring Suppl (ACCU-CHEK AVIVA PLUS) w/Device KIT Use to check blood sugar two times a day 08/20/20   Bedsole, Amy E, MD  glucose blood (ACCU-CHEK AVIVA PLUS) test strip Use to check blood sugar two times a day. 08/20/20   Bedsole, Amy E, MD  neomycin-bacitracin-polymyxin (NEOSPORIN) ointment Apply 1 application topically as needed for wound care.     [provider]    Allergies as of 06/22/2021 - Review  Complete 06/22/2021  Allergen Reaction Noted   Amoxicillin-pot clavulanate     Codeine     Hydrocodone-acetaminophen     Lovastatin     Sulfonamide derivatives     Tizanidine  05/05/2012    Family History  Problem Relation Age of Onset   Cancer Mother        breast   Stroke Mother    Valvular heart disease Mother    Breast cancer Mother 21   Alzheimer's disease Mother    Kidney disease Father    Stroke Father    Coronary artery disease Father    Parkinson's disease Father    Diabetes Brother    Alzheimer's disease Maternal Grandmother    Diabetes Maternal Aunt    Alzheimer's disease Maternal Aunt    Alzheimer's disease Maternal Aunt    Colon cancer Neg Hx    Pancreatic cancer Neg Hx    Stomach cancer Neg Hx     Social History   Socioeconomic History   Marital status: Married    Spouse name: Not on file   Number of children: 1   Years of education: Not on file   Highest education level: Some college, no degree  Occupational History   Occupation: retired Nature conservation officer estate)    Fish farm manager: retired  Tobacco Use   Smoking status: Former    Types: Cigarettes    Quit date: 09/27/1988    Years since quitting: 32.7   Smokeless tobacco: Never  Vaping Use   Vaping Use: Never used  Substance and Sexual Activity   Alcohol use: No    Alcohol/week: 0.0 standard drinks   Drug use: No   Sexual activity: Not on file  Other Topics Concern   Not on file  Social History Narrative   Regular exercise--no   Diet: fruits and veggies, calcium, water, avoids fast food   Right handed   Caffeine use: 1 quart to 1/2 gallon of tea daily                  Social Determinants of Health   Financial Resource Strain: Not on file  Food Insecurity: Not on file  Transportation Needs: Not on file  Physical Activity: Not on file  Stress: Not on file  Social Connections: Not on file  Intimate Partner Violence: Not on file    Review of Systems: See HPI, otherwise negative ROS  Physical  Exam: BP (!) 173/88   Pulse 92   Temp 98.1 F (36.7 C) (Temporal)   Resp 18   Ht 5' (1.524 m)   Wt 74.4 kg   SpO2 100%  BMI 32.03 kg/m  General:   Alert,  pleasant and cooperative in NAD Head:  Normocephalic and atraumatic. Neck:  Supple; no masses or thyromegaly. Lungs:  Clear throughout to auscultation.    Heart:  Regular rate and rhythm. Abdomen:  Soft, nontender and nondistended. Normal bowel sounds, without guarding, and without rebound.   Neurologic:  Alert and  oriented x4;  grossly normal neurologically.  Impression/Plan: Cathy Mullins is here for an endoscopy and colonoscopy to be performed for rectal bleeding and nausea with vomiting  Risks, benefits, limitations, and alternatives regarding  endoscopy and colonoscopy have been reviewed with the patient.  Questions have been answered.  All parties agreeable.    , MD  06/29/2021, 7:25 AM 

## 2021-06-29 NOTE — Anesthesia Procedure Notes (Signed)
Date/Time: 06/29/2021 7:54 AM Performed by: Dionne Bucy, CRNA Pre-anesthesia Checklist: Patient identified, Emergency Drugs available, Suction available, Patient being monitored and Timeout performed Patient Re-evaluated:Patient Re-evaluated prior to induction Oxygen Delivery Method: Nasal cannula Induction Type: IV induction Placement Confirmation: positive ETCO2

## 2021-06-29 NOTE — Anesthesia Postprocedure Evaluation (Signed)
Anesthesia Post Note  Patient: Cathy Mullins  Procedure(s) Performed: ESOPHAGOGASTRODUODENOSCOPY (EGD) WITH PROPOFOL (Throat) COLONOSCOPY WITH PROPOFOL (Rectum)     Patient location during evaluation: PACU Anesthesia Type: General Level of consciousness: awake and alert Pain management: pain level controlled Vital Signs Assessment: post-procedure vital signs reviewed and stable Respiratory status: spontaneous breathing, nonlabored ventilation, respiratory function stable and patient connected to nasal cannula oxygen Cardiovascular status: blood pressure returned to baseline and stable Postop Assessment: no apparent nausea or vomiting Anesthetic complications: no   No notable events documented.  Sinda Du

## 2021-06-29 NOTE — Anesthesia Preprocedure Evaluation (Signed)
Anesthesia Evaluation    History of Anesthesia Complications (+) history of anesthetic complications  Airway Mallampati: II  TM Distance: >3 FB Neck ROM: Full    Dental no notable dental hx.    Pulmonary asthma , COPD, former smoker,    Pulmonary exam normal breath sounds clear to auscultation       Cardiovascular Exercise Tolerance: Good hypertension, Normal cardiovascular exam Rhythm:Regular Rate:Normal     Neuro/Psych  Headaches, TIA Neuromuscular disease    GI/Hepatic GERD  Poorly Controlled,  Endo/Other  diabetes  Renal/GU Renal disease     Musculoskeletal  (+) Arthritis , Osteoarthritis,  Fibromyalgia -  Abdominal (+) + obese,   Peds  Hematology   Anesthesia Other Findings   Reproductive/Obstetrics                             Anesthesia Physical Anesthesia Plan  ASA: 3  Anesthesia Plan: General   Post-op Pain Management: Minimal or no pain anticipated   Induction: Intravenous  PONV Risk Score and Plan: 3 and Propofol infusion and Treatment may vary due to age or medical condition  Airway Management Planned: Natural Airway and Nasal Cannula  Additional Equipment:   Intra-op Plan:   Post-operative Plan:   Informed Consent: I have reviewed the patients History and Physical, chart, labs and discussed the procedure including the risks, benefits and alternatives for the proposed anesthesia with the patient or authorized representative who has indicated his/her understanding and acceptance.     Dental advisory given  Plan Discussed with: CRNA and Anesthesiologist  Anesthesia Plan Comments:         Anesthesia Quick Evaluation  Patient Active Problem List   Diagnosis Date Noted  . Peripheral edema 05/30/2020  . Dark stools 12/21/2019  . Neuralgia of left glossopharyngeal nerve 09/27/2019  . Nausea and vomiting 09/05/2019  . Chronic left ear pain 09/05/2019  .  Folliculitis and perifolliculitis 46/50/3546  . Near syncope 02/16/2018  . Mitral valve prolapse 01/31/2018  . Migraine with aura and without status migrainosus 07/26/2017  . CKD stage 3 due to type 2 diabetes mellitus ([place]) 02/11/2017  . Bilateral knee pain 01/08/2016  . Spinal stenosis of lumbar region 12/03/2014  . Counseling regarding end of life decision making 07/09/2014  . Lichen sclerosus et atrophicus of the vulva 03/01/2011  . Acute left-sided low back pain with left-sided sciatica 10/02/2010  . Palpitations 01/21/2010  . INSOMNIA, CHRONIC 02/18/2009  . OSTEOARTHRITIS, HANDS, BILATERAL 02/18/2009  . DM (diabetes mellitus), type 2 with renal complications ([place]) 56/81/2751  . Hypertension associated with diabetes ([place]) 05/01/2007  . Fibromyalgia 05/01/2007  . Hx-TIA (transient ischemic attack) 05/01/2007  . Hyperlipidemia associated with type 2 diabetes mellitus ([place]) 11/18/2006  . GLAUCOMA NOS 11/18/2006  . ALLERGIC RHINITIS 11/18/2006  . COPD 11/18/2006  . GERD 11/18/2006  . COLONIC POLYPS, HX OF 11/18/2006    CBC Latest Ref Rng & Units 04/19/2019 06/21/2017 11/27/2015  WBC 4.0 - 10.5 K/uL 9.3 10.8(H) 9.3  Hemoglobin 12.0 - 15.0 g/dL 13.1 12.4 12.2  Hematocrit 36.0 - 46.0 % 39.5 37.9 36.4  Platelets 150.0 - 400.0 K/uL 400.0 385.0 398.0   BMP Latest Ref Rng & Units 12/19/2020 06/17/2020 12/14/2019  Glucose 70 - 99 mg/dL 155(H) 142(H) 140(H)  BUN 6 - 23 mg/dL 22 22 19   Creatinine 0.40 - 1.20 mg/dL 1.14 1.12 1.14  Sodium 135 - 145 mEq/L 135 135 133(L)  Potassium 3.5 - 5.1 mEq/L  4.6 4.6 4.3  Chloride 96 - 112 mEq/L 96 97 98  CO2 19 - 32 mEq/L 28 31 32  Calcium 8.4 - 10.5 mg/dL 10.2 9.9 10.1    Risks and benefits of anesthesia discussed at length, patient or surrogate demonstrates understanding. Appropriately NPO. Plan to proceed with anesthesia.  Champ Mungo, MD 06/29/21

## 2021-06-29 NOTE — Op Note (Signed)
[facility] Gastroenterology Patient Name: Cathy Mullins Procedure Date: 06/29/2021 7:43 AM MRN: [id number] Account #: [id number] Date of Birth: Jun 23, 1946 Admit Type: Outpatient Age: 75 Room: [facility] OR ROOM 1 Gender: Female Note Status: Finalized Instrument Name: [id number] Procedure:             Colonoscopy Indications:           Hematochezia Providers:             Lucilla Lame MD, MD Referring MD:          Jinny Sanders MD, MD (Referring MD) Medicines:             Propofol per Anesthesia Complications:         No immediate complications. Procedure:             Pre-Anesthesia Assessment:                        - Prior to the procedure, a History and Physical was                         performed, and patient medications and allergies were                         reviewed. The patient's tolerance of previous                         anesthesia was also reviewed. The risks and benefits                         of the procedure and the sedation options and risks                         were discussed with the patient. All questions were                         answered, and informed consent was obtained. Prior                         Anticoagulants: The patient has taken no previous                         anticoagulant or antiplatelet agents. ASA Grade                         Assessment: II - A patient with mild systemic disease.                         After reviewing the risks and benefits, the patient                         was deemed in satisfactory condition to undergo the                         procedure.                        After obtaining informed consent, the colonoscope was  passed under direct vision. Throughout the procedure,                         the patient's blood pressure, pulse, and oxygen                         saturations were monitored continuously. The                         Colonoscope was introduced through the anus  and                         advanced to the the cecum, identified by appendiceal                         orifice and ileocecal valve. The colonoscopy was                         performed without difficulty. The patient tolerated                         the procedure well. The quality of the bowel                         preparation was excellent. Findings:      The perianal and digital rectal examinations were normal.      Non-bleeding external internal hemorrhoids were found during       retroflexion. The hemorrhoids were Grade III (internal hemorrhoids that       prolapse but require manual reduction).      The exam was otherwise without abnormality. Impression:            - Non-bleeding external internal hemorrhoids.                        - The examination was otherwise normal.                        - No specimens collected. Recommendation:        - Discharge patient to home.                        - Resume previous diet.                        - Continue present medications.                        - Repeat colonoscopy is not recommended for                         surveillance.                        - If bleeding continues then contact my office to set                         up banding. Procedure Code(s):     --- Professional ---  45378, Colonoscopy, flexible; diagnostic, including                         collection of specimen(s) by brushing or washing, when                         performed (separate procedure) Diagnosis Code(s):     --- Professional ---                        K92.1, Melena (includes Hematochezia) CPT copyright 2019 American Medical Association. All rights reserved. The codes documented in this report are preliminary and upon coder review may  be revised to meet current compliance requirements. Lucilla Lame MD, MD 06/29/2021 8:23:45 AM This report has been signed electronically. Number of Addenda: 0 Note Initiated On: 06/29/2021 7:43  AM Scope Withdrawal Time: 0 hours 9 minutes 42 seconds  Total Procedure Duration: 0 hours 16 minutes 43 seconds  Estimated Blood Loss:  Estimated blood loss: none.      [facility]

## 2021-06-29 NOTE — Transfer of Care (Signed)
Immediate Anesthesia Transfer of Care Note  Patient: Cathy Mullins  Procedure(s) Performed: ESOPHAGOGASTRODUODENOSCOPY (EGD) WITH PROPOFOL (Throat) COLONOSCOPY WITH PROPOFOL (Rectum)  Patient Location: PACU  Anesthesia Type: General  Level of Consciousness: awake, alert  and patient cooperative  Airway and Oxygen Therapy: Patient Spontanous Breathing and Patient connected to supplemental oxygen  Post-op Assessment: Post-op Vital signs reviewed, Patient's Cardiovascular Status Stable, Respiratory Function Stable, Patent Airway and No signs of Nausea or vomiting  Post-op Vital Signs: Reviewed and stable  Complications: No notable events documented.

## 2021-06-30 ENCOUNTER — Encounter: Payer: Self-pay | Admitting: Gastroenterology

## 2021-07-01 LAB — FECAL FAT, QUALITATIVE
Fat Qual Neutral, Stl: NORMAL
Fat Qual Total, Stl: NORMAL

## 2021-07-01 LAB — PANCREATIC ELASTASE, FECAL: Pancreatic Elastase, Fecal: 157 ug Elast./g — ABNORMAL LOW (ref >200)

## 2021-07-06 ENCOUNTER — Other Ambulatory Visit: Payer: Self-pay | Admitting: Family Medicine

## 2021-07-06 NOTE — Telephone Encounter (Signed)
Please can and schedule: Return in about 3 months (around 03/28/2021) for  DM follow up with POC A1C.

## 2021-07-06 NOTE — Telephone Encounter (Signed)
Pt schedule a f/u appt in february

## 2021-07-13 ENCOUNTER — Telehealth: Payer: Self-pay

## 2021-07-13 ENCOUNTER — Other Ambulatory Visit: Payer: Self-pay

## 2021-07-13 MED ORDER — PANCRELIPASE (LIP-PROT-AMYL) 36000-114000 UNITS PO CPEP: ORAL_CAPSULE | ORAL | 3 refills | Status: DC

## 2021-07-13 NOTE — Telephone Encounter (Signed)
Pt notified of lab results

## 2021-07-13 NOTE — Telephone Encounter (Signed)
-----   Message from Lucilla Lame, MD sent at 07/06/2021 11:14 AM EST ----- Please let the patient know that the pancreatic elastase for the enzymes of the pancreas are making to help digest her food is showing moderate pancreatic insufficiency.  This is not a cancerous or precancerous state but just means that the function is decreased.  The patient should be started on pancreatic enzymes.

## 2021-07-14 ENCOUNTER — Other Ambulatory Visit: Payer: Self-pay

## 2021-07-14 MED ORDER — ZENPEP 40000-126000 UNITS PO CPEP: ORAL_CAPSULE | ORAL | 5 refills | Status: DC

## 2021-07-17 ENCOUNTER — Telehealth: Payer: Self-pay | Admitting: Family Medicine

## 2021-07-17 MED ORDER — LISINOPRIL 10 MG PO TABS: 10.0000 mg | ORAL_TABLET | Freq: Every day | ORAL | 3 refills | Status: DC

## 2021-07-17 NOTE — Telephone Encounter (Signed)
Pt called stating that Dr. Allen Norris has diagnosed her with the EPI. Pt was doing research and she found out that HCTZ Is not good for people who have exocrine pancreatic insufficiency (EPI). Pt wants to know if she can go back on lisinopril

## 2021-07-17 NOTE — Telephone Encounter (Signed)
Yes.. she can stop HCTZ and start lisinopril 10 mg daily. Follow BO at home over next 2 weeks.. call if > 140/90.  Rx sent to mail order.

## 2021-07-17 NOTE — Telephone Encounter (Signed)
Mrs. Cathy Mullins notified as instructed by telephone. Patient states understanding.

## 2021-07-21 DIAGNOSIS — H401131 Primary open-angle glaucoma, bilateral, mild stage: Secondary | ICD-10-CM | POA: Diagnosis not present

## 2021-07-28 DIAGNOSIS — E119 Type 2 diabetes mellitus without complications: Secondary | ICD-10-CM | POA: Diagnosis not present

## 2021-07-28 LAB — HM DIABETES EYE EXAM

## 2021-09-06 ENCOUNTER — Other Ambulatory Visit: Payer: Self-pay | Admitting: Family Medicine

## 2021-09-07 ENCOUNTER — Other Ambulatory Visit: Payer: Self-pay | Admitting: Family Medicine

## 2021-09-07 DIAGNOSIS — Z1231 Encounter for screening mammogram for malignant neoplasm of breast: Secondary | ICD-10-CM

## 2021-09-11 ENCOUNTER — Other Ambulatory Visit: Payer: Self-pay | Admitting: Family Medicine

## 2021-09-11 DIAGNOSIS — E1122 Type 2 diabetes mellitus with diabetic chronic kidney disease: Secondary | ICD-10-CM

## 2021-09-14 LAB — HM DIABETES FOOT EXAM

## 2021-09-28 ENCOUNTER — Other Ambulatory Visit: Payer: Self-pay | Admitting: Family Medicine

## 2021-09-29 NOTE — Telephone Encounter (Signed)
Last office visit 12/26/20 for [place].  Last refilled 03/20/21 for #120 with 3 refills.  Next Appt: 10/06/21 for DM.

## 2021-09-30 ENCOUNTER — Other Ambulatory Visit: Payer: Self-pay | Admitting: Gastroenterology

## 2021-10-06 ENCOUNTER — Ambulatory Visit (INDEPENDENT_AMBULATORY_CARE_PROVIDER_SITE_OTHER): Payer: Medicare HMO | Admitting: Family Medicine

## 2021-10-06 ENCOUNTER — Other Ambulatory Visit: Payer: Self-pay

## 2021-10-06 ENCOUNTER — Encounter: Payer: Self-pay | Admitting: Family Medicine

## 2021-10-06 VITALS — BP 132/86 | HR 58 | Temp 97.8°F | Resp 14 | Ht 61.0 in | Wt 166.4 lb

## 2021-10-06 DIAGNOSIS — E1122 Type 2 diabetes mellitus with diabetic chronic kidney disease: Secondary | ICD-10-CM

## 2021-10-06 DIAGNOSIS — N1831 Chronic kidney disease, stage 3a: Secondary | ICD-10-CM | POA: Diagnosis not present

## 2021-10-06 DIAGNOSIS — K8689 Other specified diseases of pancreas: Secondary | ICD-10-CM

## 2021-10-06 LAB — POCT GLYCOSYLATED HEMOGLOBIN (HGB A1C): Hemoglobin A1C: 5.8 % — AB (ref 4.0–5.6)

## 2021-10-06 MED ORDER — METFORMIN HCL ER 500 MG PO TB24: ORAL_TABLET | ORAL | 0 refills | Status: DC

## 2021-10-06 NOTE — Progress Notes (Incomplete)
Patient ID: Cathy Mullins, female    DOB: September 21, 1945, 76 y.o.   MRN: [id number]  This visit was conducted in person.  BP 132/86 (BP Location: Left Arm, Patient Position: Sitting, Cuff Size: Normal)    Pulse (!) 58    Temp 97.8 F (36.6 C) (Oral)    Resp 14    Ht _0  (1.549 m)    Wt 166 lb 6.4 oz (75.5 kg)    SpO2 97%    BMI 31.44 kg/m    CC:  Chief Complaint  Patient presents with   Follow-up    Diabetes, discuss dizziness new onset this year due to recent diagnosis from EPI has been getting worse.     Subjective:   HPI: Cathy Mullins is a 76 y.o. female presenting on 10/06/2021 for Follow-up (Diabetes, discuss dizziness new onset this year due to recent diagnosis from EPI has been getting worse. )  Pancreatic insufficiency: Followed Dr. Allen Norris. Causing diarrhea, emesis, abd pain.Marland Kitchen improved now  06/2021 colonscopy normal.   She is now on pancrealipase. Wt Readings from Last 3 Encounters:  10/06/21 166 lb 6.4 oz (75.5 kg)  06/29/21 164 lb (74.4 kg)  06/22/21 168 lb (76.2 kg)   Down from 174 in last year.   BP Readings from Last 3 Encounters:  10/06/21 132/86  06/29/21 (!) 137/58  06/22/21 (!) 142/79   Body mass index is 31.44 kg/m.   Diabetes:  Lab Results  Component Value Date   HGBA1C 5.8 (A) 10/06/2021  Using medications without difficulties: Hypoglycemic episodes: Hyperglycemic episodes: Feet problems: Blood Sugars averaging: eye exam within last year: yes       Relevant past medical, surgical, family and social history reviewed and updated as indicated. Interim medical history since our last visit reviewed. Allergies and medications reviewed and updated. Outpatient Medications Prior to Visit  Medication Sig Dispense Refill   Accu-Chek Softclix Lancets lancets Use to check blood sugar two times a day. 200 each 3   albuterol (PROVENTIL HFA;VENTOLIN HFA) 108 (90 BASE) MCG/ACT inhaler 2 puffs every 4 hours as needed for wheezing. 1 Inhaler 0   Alcohol  Swabs (B-D SINGLE USE SWABS REGULAR) PADS Use to check blood sugar two times a day 200 each 3   Blood Glucose Calibration (ACCU-CHEK AVIVA) SOLN Use to check controls on glucose meter 1 each 3   Blood Glucose Monitoring Suppl (ACCU-CHEK AVIVA PLUS) w/Device KIT Use to check blood sugar two times a day 1 kit 0   desipramine (NORPRAMIN) 25 MG tablet TAKE 1 TABLET AT BEDTIME 90 tablet 1   diclofenac sodium (VOLTAREN) 1 % GEL APPLY 2 GRAMS TOPICALLY FOUR TIMES A DAY AS NEEDED 300 g 1   finasteride (PROSCAR) 5 MG tablet Take 2.5 mg by mouth daily.     glucose blood (ACCU-CHEK AVIVA PLUS) test strip TEST BLOOD SUGAR TWICE DAILY 200 strip 0   latanoprost (XALATAN) 0.005 % ophthalmic solution Place 1 drop into both eyes at bedtime.     lisinopril (ZESTRIL) 10 MG tablet Take 1 tablet (10 mg total) by mouth daily. 90 tablet 3   Melatonin 10 MG CAPS Take by mouth.     metFORMIN (GLUCOPHAGE-XR) 500 MG 24 hr tablet TAKE 2 TABLETS BY MOUTH EVERY MORNING  AND TAKE 1 TABLET AT NIGHT 270 tablet 0   metoprolol tartrate (LOPRESSOR) 50 MG tablet TAKE 1 TABLET TWICE DAILY 180 tablet 0   Multiple Vitamins-Minerals (CENTRUM ADULTS PO) Take 1 tablet by mouth  daily.     neomycin-bacitracin-polymyxin (NEOSPORIN) ointment Apply 1 application topically as needed for wound care.      Pancrelipase, Lip-Prot-Amyl, (ZENPEP) 40000-126000 units CPEP Take 2 capsules with each meal and 1 capsule with a snack 300 capsule 5   timolol (TIMOPTIC) 0.5 % ophthalmic solution Place 1 drop into both eyes daily.      traMADol (ULTRAM) 50 MG tablet TAKE 1 TABLET FOUR TIMES DAILY 120 tablet 0   aspirin 81 MG tablet Take 81 mg by mouth as needed. (Patient not taking: Reported on 06/23/2021)     ferrous sulfate 325 (65 FE) MG tablet Take 325 mg by mouth daily with breakfast. (Patient not taking: Reported on 10/06/2021)     Facility-Administered Medications Prior to Visit  Medication Dose Route Frequency Provider Last Rate Last  Admin   betamethasone acetate-betamethasone sodium phosphate (CELESTONE) injection 3 mg  3 mg Intramuscular Once Edrick Kins, DPM         Per HPI unless specifically indicated in ROS section below Review of Systems Objective:  BP 132/86 (BP Location: Left Arm, Patient Position: Sitting, Cuff Size: Normal)    Pulse (!) 58    Temp 97.8 F (36.6 C) (Oral)    Resp 14    Ht _0  (1.549 m)    Wt 166 lb 6.4 oz (75.5 kg)    SpO2 97%    BMI 31.44 kg/m   Wt Readings from Last 3 Encounters:  10/06/21 166 lb 6.4 oz (75.5 kg)  06/29/21 164 lb (74.4 kg)  06/22/21 168 lb (76.2 kg)      Physical Exam    Diabetic foot exam: Normal inspection No skin breakdown No calluses  Normal DP pulses Normal sensation to light touch and monofilament Nails normal  Results for orders placed or performed in visit on 10/06/21  POCT glycosylated hemoglobin (Hb A1C)  Result Value Ref Range   Hemoglobin A1C 5.8 (A) 4.0 - 5.6 %   HbA1c POC (<> result, manual entry)     HbA1c, POC (prediabetic range)     HbA1c, POC (controlled diabetic range)      This visit occurred during the SARS-CoV-2 public health emergency.  Safety protocols were in place, including screening questions prior to the visit, additional usage of staff PPE, and extensive cleaning of exam room while observing appropriate contact time as indicated for disinfecting solutions.   COVID 19 screen:  No recent travel or known exposure to COVID19 The patient denies respiratory symptoms of COVID 19 at this time. The importance of social distancing was discussed today.   Assessment and Plan     Eliezer Lofts, MD

## 2021-10-06 NOTE — Patient Instructions (Signed)
Return to metformin XR 1 tab twice daily.  Call if fasting blood sugars are dropping < 60.

## 2021-10-07 ENCOUNTER — Encounter: Payer: Self-pay | Admitting: Family Medicine

## 2021-10-13 ENCOUNTER — Other Ambulatory Visit: Payer: Self-pay

## 2021-10-13 ENCOUNTER — Ambulatory Visit
Admission: RE | Admit: 2021-10-13 | Discharge: 2021-10-13 | Disposition: A | Discharge status: Home / Self Care | Payer: Medicare HMO | Source: Ambulatory Visit | Attending: Family Medicine | Admitting: Family Medicine

## 2021-10-13 DIAGNOSIS — Z1231 Encounter for screening mammogram for malignant neoplasm of breast: Secondary | ICD-10-CM | POA: Diagnosis not present

## 2021-10-14 ENCOUNTER — Other Ambulatory Visit: Payer: Self-pay | Admitting: Family Medicine

## 2021-10-14 DIAGNOSIS — N1831 Chronic kidney disease, stage 3a: Secondary | ICD-10-CM

## 2021-11-05 DIAGNOSIS — K8689 Other specified diseases of pancreas: Secondary | ICD-10-CM | POA: Insufficient documentation

## 2021-11-05 NOTE — Assessment & Plan Note (Signed)
Chronic, well controlled ?She is followed by Dr. Allen Norris GI. ?He is doing better now on pancrelipase supplementation. ?

## 2021-11-05 NOTE — Assessment & Plan Note (Signed)
Chronic, well controlled  ?Associated with renal issues ? ?She has been having some hypoglycemia and A1c is lower than we need to reach with medication.   ?I will have her decrease her dose of metformin to Return to metformin XR 1 tab twice daily. ? Call if fasting blood sugars are dropping < 60. ? ?

## 2021-12-14 ENCOUNTER — Other Ambulatory Visit: Payer: Self-pay | Admitting: Family Medicine

## 2021-12-23 ENCOUNTER — Telehealth: Payer: Self-pay | Admitting: Family Medicine

## 2021-12-23 DIAGNOSIS — E1122 Type 2 diabetes mellitus with diabetic chronic kidney disease: Secondary | ICD-10-CM

## 2021-12-23 NOTE — Telephone Encounter (Signed)
-----   Message from Velna Hatchet, RT sent at 12/14/2021 12:49 PM EDT ----- ?Regarding: Lab Tue 12/29/21 ?Patient is scheduled for cpx, please order future labs.  Thanks, Anda Kraft ? ? ?

## 2021-12-25 ENCOUNTER — Other Ambulatory Visit: Payer: Self-pay | Admitting: Family Medicine

## 2021-12-25 DIAGNOSIS — E1122 Type 2 diabetes mellitus with diabetic chronic kidney disease: Secondary | ICD-10-CM

## 2021-12-29 ENCOUNTER — Other Ambulatory Visit (INDEPENDENT_AMBULATORY_CARE_PROVIDER_SITE_OTHER): Payer: Medicare HMO

## 2021-12-29 DIAGNOSIS — N1831 Chronic kidney disease, stage 3a: Secondary | ICD-10-CM

## 2021-12-29 DIAGNOSIS — E1122 Type 2 diabetes mellitus with diabetic chronic kidney disease: Secondary | ICD-10-CM

## 2021-12-29 LAB — COMPREHENSIVE METABOLIC PANEL
ALT: 39 U/L — ABNORMAL HIGH (ref 0–35)
AST: 28 U/L (ref 0–37)
Albumin: 4.2 g/dL (ref 3.5–5.2)
Alkaline Phosphatase: 53 U/L (ref 39–117)
BUN: 24 mg/dL — ABNORMAL HIGH (ref 6–23)
CO2: 29 mEq/L (ref 19–32)
Calcium: 9.7 mg/dL (ref 8.4–10.5)
Chloride: 100 mEq/L (ref 96–112)
Creatinine, Ser: 1.14 mg/dL (ref 0.40–1.20)
GFR: 47.03 mL/min — ABNORMAL LOW (ref >60.00)
Glucose, Bld: 107 mg/dL — ABNORMAL HIGH (ref 70–99)
Potassium: 4.5 mEq/L (ref 3.5–5.1)
Sodium: 134 mEq/L — ABNORMAL LOW (ref 135–145)
Total Bilirubin: 0.3 mg/dL (ref 0.2–1.2)
Total Protein: 7.2 g/dL (ref 6.0–8.3)

## 2021-12-29 LAB — LIPID PANEL
Cholesterol: 239 mg/dL — ABNORMAL HIGH (ref 0–200)
HDL: 59.6 mg/dL (ref >39.00)
LDL Cholesterol: 149 mg/dL — ABNORMAL HIGH (ref 0–99)
NonHDL: 179.35
Total CHOL/HDL Ratio: 4
Triglycerides: 151 mg/dL — ABNORMAL HIGH (ref 0.0–149.0)
VLDL: 30.2 mg/dL (ref 0.0–40.0)

## 2021-12-29 LAB — HEMOGLOBIN A1C: Hgb A1c MFr Bld: 6.2 % (ref 4.6–6.5)

## 2021-12-29 NOTE — Progress Notes (Signed)
No critical labs need to be addressed urgently. We will discuss labs in detail at upcoming office visit.   

## 2021-12-31 ENCOUNTER — Ambulatory Visit (INDEPENDENT_AMBULATORY_CARE_PROVIDER_SITE_OTHER): Payer: Medicare HMO

## 2021-12-31 VITALS — Wt 166.0 lb

## 2021-12-31 DIAGNOSIS — Z0000 Encounter for general adult medical examination without abnormal findings: Secondary | ICD-10-CM

## 2021-12-31 NOTE — Patient Instructions (Signed)
Cathy Mullins , Thank you for taking time to come for your Medicare Wellness Visit. I appreciate your ongoing commitment to your health goals. Please review the following plan we discussed and let me know if I can assist you in the future.   Screening recommendations/referrals: Colonoscopy: 06/29/21 Mammogram: 10/13/21 Bone Density: declined Recommended yearly ophthalmology/optometry visit for glaucoma screening and checkup Recommended yearly dental visit for hygiene and checkup  Vaccinations: Influenza vaccine: 06/23/21 Pneumococcal vaccine: 07/09/14 Tdap vaccine: 03/09/04, due Shingles vaccine: n/d   Covid-19:10/04/19, 10/25/19, 06/09/20, 06/23/21  Advanced directives: no  Conditions/risks identified: none  Next appointment: Follow up in one year for your annual wellness visit 01/04/23 @ 11:45am by phone   Preventive Care 27 Years and Older, Female Preventive care refers to lifestyle choices and visits with your health care provider that can promote health and wellness. What does preventive care include? A yearly physical exam. This is also called an annual well check. Dental exams once or twice a year. Routine eye exams. Ask your health care provider how often you should have your eyes checked. Personal lifestyle choices, including: Daily care of your teeth and gums. Regular physical activity. Eating a healthy diet. Avoiding tobacco and drug use. Limiting alcohol use. Practicing safe sex. Taking low-dose aspirin every day. Taking vitamin and mineral supplements as recommended by your health care provider. What happens during an annual well check? The services and screenings done by your health care provider during your annual well check will depend on your age, overall health, lifestyle risk factors, and family history of disease. Counseling  Your health care provider may ask you questions about your: Alcohol use. Tobacco use. Drug use. Emotional well-being. Home and relationship  well-being. Sexual activity. Eating habits. History of falls. Memory and ability to understand (cognition). Work and work Statistician. Reproductive health. Screening  You may have the following tests or measurements: Height, weight, and BMI. Blood pressure. Lipid and cholesterol levels. These may be checked every 5 years, or more frequently if you are over 14 years old. Skin check. Lung cancer screening. You may have this screening every year starting at age 42 if you have a 30-pack-year history of smoking and currently smoke or have quit within the past 15 years. Fecal occult blood test (FOBT) of the stool. You may have this test every year starting at age 105. Flexible sigmoidoscopy or colonoscopy. You may have a sigmoidoscopy every 5 years or a colonoscopy every 10 years starting at age 42. Hepatitis C blood test. Hepatitis B blood test. Sexually transmitted disease (STD) testing. Diabetes screening. This is done by checking your blood sugar (glucose) after you have not eaten for a while (fasting). You may have this done every 1-3 years. Bone density scan. This is done to screen for osteoporosis. You may have this done starting at age 32. Mammogram. This may be done every 1-2 years. Talk to your health care provider about how often you should have regular mammograms. Talk with your health care provider about your test results, treatment options, and if necessary, the need for more tests. Vaccines  Your health care provider may recommend certain vaccines, such as: Influenza vaccine. This is recommended every year. Tetanus, diphtheria, and acellular pertussis (Tdap, Td) vaccine. You may need a Td booster every 10 years. Zoster vaccine. You may need this after age 57. Pneumococcal 13-valent conjugate (PCV13) vaccine. One dose is recommended after age 67. Pneumococcal polysaccharide (PPSV23) vaccine. One dose is recommended after age 41. Talk to your health  care provider about which  screenings and vaccines you need and how often you need them. This information is not intended to replace advice given to you by your health care provider. Make sure you discuss any questions you have with your health care provider. Document Released: 08/22/2015 Document Revised: 04/14/2016 Document Reviewed: 05/27/2015 Elsevier Interactive Patient Education  2017 [place] Prevention in the Home Falls can cause injuries. They can happen to people of all ages. There are many things you can do to make your home safe and to help prevent falls. What can I do on the outside of my home? Regularly fix the edges of walkways and driveways and fix any cracks. Remove anything that might make you trip as you walk through a door, such as a raised step or threshold. Trim any bushes or trees on the path to your home. Use bright outdoor lighting. Clear any walking paths of anything that might make someone trip, such as rocks or tools. Regularly check to see if handrails are loose or broken. Make sure that both sides of any steps have handrails. Any raised decks and porches should have guardrails on the edges. Have any leaves, snow, or ice cleared regularly. Use sand or salt on walking paths during winter. Clean up any spills in your garage right away. This includes oil or grease spills. What can I do in the bathroom? Use night lights. Install grab bars by the toilet and in the tub and shower. Do not use towel bars as grab bars. Use non-skid mats or decals in the tub or shower. If you need to sit down in the shower, use a plastic, non-slip stool. Keep the floor dry. Clean up any water that spills on the floor as soon as it happens. Remove soap buildup in the tub or shower regularly. Attach bath mats securely with double-sided non-slip rug tape. Do not have throw rugs and other things on the floor that can make you trip. What can I do in the bedroom? Use night lights. Make sure that you have a  light by your bed that is easy to reach. Do not use any sheets or blankets that are too big for your bed. They should not hang down onto the floor. Have a firm chair that has side arms. You can use this for support while you get dressed. Do not have throw rugs and other things on the floor that can make you trip. What can I do in the kitchen? Clean up any spills right away. Avoid walking on wet floors. Keep items that you use a lot in easy-to-reach places. If you need to reach something above you, use a strong step stool that has a grab bar. Keep electrical cords out of the way. Do not use floor polish or wax that makes floors slippery. If you must use wax, use non-skid floor wax. Do not have throw rugs and other things on the floor that can make you trip. What can I do with my stairs? Do not leave any items on the stairs. Make sure that there are handrails on both sides of the stairs and use them. Fix handrails that are broken or loose. Make sure that handrails are as long as the stairways. Check any carpeting to make sure that it is firmly attached to the stairs. Fix any carpet that is loose or worn. Avoid having throw rugs at the top or bottom of the stairs. If you do have throw rugs, attach them to  the floor with carpet tape. Make sure that you have a light switch at the top of the stairs and the bottom of the stairs. If you do not have them, ask someone to add them for you. What else can I do to help prevent falls? Wear shoes that: Do not have high heels. Have rubber bottoms. Are comfortable and fit you well. Are closed at the toe. Do not wear sandals. If you use a stepladder: Make sure that it is fully opened. Do not climb a closed stepladder. Make sure that both sides of the stepladder are locked into place. Ask someone to hold it for you, if possible. Clearly mark and make sure that you can see: Any grab bars or handrails. First and last steps. Where the edge of each step  is. Use tools that help you move around (mobility aids) if they are needed. These include: Canes. Walkers. Scooters. Crutches. Turn on the lights when you go into a dark area. Replace any light bulbs as soon as they burn out. Set up your furniture so you have a clear path. Avoid moving your furniture around. If any of your floors are uneven, fix them. If there are any pets around you, be aware of where they are. Review your medicines with your doctor. Some medicines can make you feel dizzy. This can increase your chance of falling. Ask your doctor what other things that you can do to help prevent falls. This information is not intended to replace advice given to you by your health care provider. Make sure you discuss any questions you have with your health care provider. Document Released: 05/22/2009 Document Revised: 01/01/2016 Document Reviewed: 08/30/2014 Elsevier Interactive Patient Education  2017 [employer].

## 2021-12-31 NOTE — Progress Notes (Signed)
Virtual Visit via Telephone Note  I connected with  Cathy Mullins on 12/31/21 at  3:30 PM EDT by telephone and verified that I am speaking with the correct person using two identifiers.  Location: Patient: home Provider: [place] Persons participating in the virtual visit: [place]   I discussed the limitations, risks, security and privacy concerns of performing an evaluation and management service by telephone and the availability of in person appointments. The patient expressed understanding and agreed to proceed.  Interactive audio and video telecommunications were attempted between this nurse and patient, however failed, due to patient having technical difficulties OR patient did not have access to video capability.  We continued and completed visit with audio only.  Some vital signs may be absent or patient reported.   Dionisio David, LPN  Subjective:   Cathy Mullins is a 76 y.o. female who presents for Medicare Annual (Subsequent) preventive examination.  Review of Systems           Objective:    There were no vitals filed for this visit. There is no height or weight on file to calculate BMI.     06/29/2021    7:00 AM 12/18/2019    3:33 PM 02/14/2018   10:40 AM 02/04/2017   12:25 PM 06/04/2016    3:05 PM  Advanced Directives  Does Patient Have a Medical Advance Directive? Yes Yes Yes Yes Yes  Type of Paramedic of [place];Living will [place];Living will [place];Living will [place]  Does patient want to make changes to medical advance directive? No - Patient declined      Copy of [place] in Chart? No - copy requested No - copy requested No - copy requested No - copy requested     Current Medications (verified) Outpatient Encounter Medications as of 12/31/2021  Medication Sig   Accu-Chek Softclix Lancets  lancets TEST BLOOD SUGAR TWICE DAILY   albuterol (PROVENTIL HFA;VENTOLIN HFA) 108 (90 BASE) MCG/ACT inhaler 2 puffs every 4 hours as needed for wheezing.   Alcohol Swabs (DROPSAFE ALCOHOL PREP) 70 % PADS USE TO CHECK BLOOD SUGAR TWO TIMES A DAY   Blood Glucose Calibration (ACCU-CHEK AVIVA) SOLN Use to check controls on glucose meter   Blood Glucose Monitoring Suppl (ACCU-CHEK AVIVA PLUS) w/Device KIT Use to check blood sugar two times a day   desipramine (NORPRAMIN) 25 MG tablet TAKE 1 TABLET AT BEDTIME   diclofenac sodium (VOLTAREN) 1 % GEL APPLY 2 GRAMS TOPICALLY FOUR TIMES A DAY AS NEEDED   finasteride (PROSCAR) 5 MG tablet Take 2.5 mg by mouth daily.   glucose blood (ACCU-CHEK AVIVA PLUS) test strip TEST BLOOD SUGAR TWICE DAILY   latanoprost (XALATAN) 0.005 % ophthalmic solution Place 1 drop into both eyes at bedtime.   lisinopril (ZESTRIL) 10 MG tablet Take 1 tablet (10 mg total) by mouth daily.   Melatonin 10 MG CAPS Take by mouth.   metFORMIN (GLUCOPHAGE-XR) 500 MG 24 hr tablet TAKE  1 tablet twice daily   metoprolol tartrate (LOPRESSOR) 50 MG tablet TAKE 1 TABLET TWICE DAILY   Multiple Vitamins-Minerals (CENTRUM ADULTS PO) Take 1 tablet by mouth daily.   neomycin-bacitracin-polymyxin (NEOSPORIN) ointment Apply 1 application topically as needed for wound care.    Pancrelipase, Lip-Prot-Amyl, (ZENPEP) 40000-126000 units CPEP Take 2 capsules with each meal and 1 capsule with a snack   timolol (TIMOPTIC) 0.5 %  ophthalmic solution Place 1 drop into both eyes daily.    traMADol (ULTRAM) 50 MG tablet TAKE 1 TABLET FOUR TIMES DAILY   Facility-Administered Encounter Medications as of 12/31/2021  Medication   betamethasone acetate-betamethasone sodium phosphate (CELESTONE) injection 3 mg    Allergies (verified) Amoxicillin-pot clavulanate, Codeine, Hydrocodone-acetaminophen, Lovastatin, Sulfonamide derivatives, Tizanidine, and Tape   History: Past Medical History:  Diagnosis Date    Allergy    Asthma with COPD ([place])    Complication of anesthesia    Difficulty waking after Spinal injection (2006)   Depression    daughter was very sick, depression began during this period of time    Diabetes mellitus    Esophagitis    Fibromyalgia    GERD (gastroesophageal reflux disease)    Glaucoma    History of colon polyps    HTN (hypertension)    Hyperlipidemia    Lichen sclerosus    Migraine    Perforating folliculitis    Spinal stenosis    Tachycardia    TIA (transient ischemic attack)    Past Surgical History:  Procedure Laterality Date   ABDOMINAL HYSTERECTOMY     right ovary remaining   APPENDECTOMY     BREAST CYST ASPIRATION Bilateral    BREAST EXCISIONAL BIOPSY Right 1982   NEG   BREAST SURGERY     right(benign)   CARPAL TUNNEL RELEASE     CESAREAN SECTION     COLONOSCOPY WITH PROPOFOL N/A 06/29/2021   Procedure: COLONOSCOPY WITH PROPOFOL;  Surgeon: Lucilla Lame, MD;  Location: [facility];  Service: Endoscopy;  Laterality: N/A;  Diabetic   EPIDURAL BLOCK INJECTION  2013   ESOPHAGOGASTRODUODENOSCOPY (EGD) WITH PROPOFOL N/A 06/29/2021   Procedure: ESOPHAGOGASTRODUODENOSCOPY (EGD) WITH PROPOFOL;  Surgeon: Lucilla Lame, MD;  Location: [facility];  Service: Endoscopy;  Laterality: N/A;   KNEE ARTHROSCOPY W/ MENISCAL REPAIR     left   LIPOMA EXCISION     2 times left leg   lumpectomy     TONSILLECTOMY     TRIGGER FINGER RELEASE     Family History  Problem Relation Age of Onset   Cancer Mother        breast   Stroke Mother    Valvular heart disease Mother    Breast cancer Mother 65   Alzheimer's disease Mother    Kidney disease Father    Stroke Father    Coronary artery disease Father    Parkinson's disease Father    Diabetes Brother    Alzheimer's disease Maternal Grandmother    Diabetes Maternal Aunt    Alzheimer's disease Maternal Aunt    Alzheimer's disease Maternal Aunt    Colon cancer Neg Hx    Pancreatic cancer Neg Hx     Stomach cancer Neg Hx    Social History   Socioeconomic History   Marital status: Married    Spouse name: Not on file   Number of children: 1   Years of education: Not on file   Highest education level: Some college, no degree  Occupational History   Occupation: retired Nature conservation officer estate)    Fish farm manager: retired  Tobacco Use   Smoking status: Former    Types: Cigarettes    Quit date: 09/27/1988    Years since quitting: 33.2   Smokeless tobacco: Never  Vaping Use   Vaping Use: Never used  Substance and Sexual Activity   Alcohol use: No    Alcohol/week: 0.0 standard drinks   Drug use: No  Sexual activity: Not on file  Other Topics Concern   Not on file  Social History Narrative   Regular exercise--no   Diet: fruits and veggies, calcium, water, avoids fast food   Right handed   Caffeine use: 1 quart to 1/2 gallon of tea daily                  Social Determinants of Health   Financial Resource Strain: Not on file  Food Insecurity: Not on file  Transportation Needs: Not on file  Physical Activity: Not on file  Stress: Not on file  Social Connections: Not on file    Tobacco Counseling Counseling given: Not Answered   Clinical Intake:  Pre-visit preparation completed: Yes  Pain : No/denies pain     Diabetes: Yes CBG done?: No Did pt. bring in CBG monitor from home?: No  How often do you need to have someone help you when you read instructions, pamphlets, or other written materials from your doctor or pharmacy?: 1 - Never  Diabetic?yes Nutrition Risk Assessment:  Has the patient had any N/V/D within the last 2 months?  No  Does the patient have any non-healing wounds?  No  Has the patient had any unintentional weight loss or weight gain?  No   Diabetes:  Is the patient diabetic?  Yes  If diabetic, was a CBG obtained today?  No  Did the patient bring in their glucometer from home?  No  How often do you monitor your CBG's? Every day   Financial  Strains and Diabetes Management:  Are you having any financial strains with the device, your supplies or your medication? No .  Does the patient want to be seen by Chronic Care Management for management of their diabetes?  No  Would the patient like to be referred to a Nutritionist or for Diabetic Management?  No   Diabetic Exams:  Diabetic Eye Exam: Completed 07/28/21. Pt has been advised about the importance in completing this exam.  Diabetic Foot Exam: Completed 09/14/21. Pt has been advised about the importance in completing this exam.    Interpreter Needed?: No  Information entered by :: Kirke Shaggy, LPN   Activities of Daily Living    06/29/2021    7:03 AM  In your present state of health, do you have any difficulty performing the following activities:  Hearing? 0  Vision? 0  Difficulty concentrating or making decisions? 0  Walking or climbing stairs? 0  Dressing or bathing? 0    Patient Care Team: Jinny Sanders, MD as PCP - General Debbora Dus, [facility] as Pharmacist (Pharmacist)  Indicate any recent Medical Services you may have received from other than [facility] providers in the past year (date may be approximate).     Assessment:   This is a routine wellness examination for [place].  Hearing/Vision screen No results found.  Dietary issues and exercise activities discussed:     Goals Addressed   None    Depression Screen    10/06/2021    2:25 PM 12/26/2020    3:28 PM 12/18/2019    3:37 PM 02/16/2018   11:09 AM 02/14/2018   10:32 AM 02/04/2017   12:26 PM 12/03/2014   12:05 PM  PHQ 2/9 Scores  PHQ - 2 Score 0 0 0 0 0 0 1  PHQ- 9 Score  4 0  0      Fall Risk    10/06/2021    2:25 PM 12/26/2020  2:54 PM 12/18/2019    3:35 PM 02/16/2018   11:09 AM 02/14/2018   10:32 AM  Fall Risk   Falls in the past year? 0 1 0 No No  Number falls in past yr: 0 1 0    Injury with Fall? 0 1 0    Risk for fall due to : No Fall Risks  Medication side effect    Follow up Falls  evaluation completed  Falls evaluation completed;Falls prevention discussed      FALL RISK PREVENTION PERTAINING TO THE HOME:  Any stairs in or around the home? Yes  If so, are there any without handrails? No  Home free of loose throw rugs in walkways, pet beds, electrical cords, etc? Yes  Adequate lighting in your home to reduce risk of falls? Yes   ASSISTIVE DEVICES UTILIZED TO PREVENT FALLS:  Life alert? No  Use of a cane, walker or w/c? Yes  Grab bars in the bathroom? Yes  Shower chair or bench in shower? No  Elevated toilet seat or a handicapped toilet? Yes    Cognitive Function: 0 points, 6CIT        12/18/2019    3:40 PM 02/14/2018   10:35 AM 02/04/2017   12:26 PM  MMSE - Mini Mental State Exam  Orientation to time '5 5 5  ' Orientation to Place '5 5 5  ' Registration '3 3 3  ' Attention/ Calculation 5 0 0  Recall '3 3 3  ' Language- name 2 objects  0 0  Language- repeat '1 1 1  ' Language- follow 3 step command  3 3  Language- read & follow direction  0 0  Write a sentence  0 0  Copy design  0 0  Total score  20 20        Immunizations Immunization History  Administered Date(s) Administered   Fluad Quad(high Dose 65+) 07/26/2019, 06/24/2020   Influenza Split 06/13/2012   Influenza Whole 08/20/2003, 05/30/2007, 05/30/2008, 06/06/2009, 06/17/2010   Influenza, High Dose Seasonal PF 06/23/2021   Influenza,inj,Quad PF,6+ Mos 06/29/2013, 07/09/2014, 04/25/2015, 05/20/2016, 05/13/2017, 06/29/2018   PFIZER(Purple Top)SARS-COV-2 Vaccination 10/04/2019, 10/25/2019, 06/09/2020   Pfizer Covid-19 Vaccine Bivalent Booster 8yr & up 06/23/2021   Pneumococcal Conjugate-13 07/09/2014   Pneumococcal Polysaccharide-23 06/04/2011   Td 03/09/2004    TDAP status: Due, Education has been provided regarding the importance of this vaccine. Advised may receive this vaccine at local pharmacy or Health Dept. Aware to provide a copy of the vaccination record if obtained from local pharmacy or  Health Dept. Verbalized acceptance and understanding.  Flu Vaccine status: Up to date  Pneumococcal vaccine status: Up to date  Covid-19 vaccine status: Completed vaccines  Qualifies for Shingles Vaccine? Yes   Zostavax completed No   Shingrix Completed?: No.    Education has been provided regarding the importance of this vaccine. Patient has been advised to call insurance company to determine out of pocket expense if they have not yet received this vaccine. Advised may also receive vaccine at local pharmacy or Health Dept. Verbalized acceptance and understanding.  Screening Tests Health Maintenance  Topic Date Due   Zoster Vaccines- Shingrix (1 of 2) Never done   DEXA SCAN  12/14/2020   TETANUS/TDAP  03/08/2024 (Originally 03/09/2014)   INFLUENZA VACCINE  03/09/2022   HEMOGLOBIN A1C  07/01/2022   OPHTHALMOLOGY EXAM  07/28/2022   FOOT EXAM  09/14/2022   MAMMOGRAM  10/14/2022   COLONOSCOPY (Pts 45-452yrInsurance coverage will need to be confirmed)  06/30/2031   Pneumonia Vaccine 15+ Years old  Completed   COVID-19 Vaccine  Completed   Hepatitis C Screening  Completed   HPV VACCINES  Aged Out    Health Maintenance  Health Maintenance Due  Topic Date Due   Zoster Vaccines- Shingrix (1 of 2) Never done   DEXA SCAN  12/14/2020    Colorectal cancer screening: Type of screening: Colonoscopy. Completed 06/29/21. Repeat every 10 years  Mammogram status: Completed 10/13/21. Repeat every year  Bone Density status: Completed 12/15/15. Results reflect: Bone density results: NORMAL. Repeat every 5 years.- declined referral  Lung Cancer Screening: (Low Dose CT Chest recommended if Age 79-80 years, 30 pack-year currently smoking OR have quit w/in 15years.) does not qualify.    Additional Screening:  Hepatitis C Screening: does qualify; Completed 10/23/15  Vision Screening: Recommended annual ophthalmology exams for early detection of glaucoma and other disorders of the eye. Is the  patient up to date with their annual eye exam?  Yes  Who is the provider or what is the name of the office in which the patient attends annual eye exams? [facility] If pt is not established with a provider, would they like to be referred to a provider to establish care? No .   Dental Screening: Recommended annual dental exams for proper oral hygiene  Community Resource Referral / Chronic Care Management: CRR required this visit?  No   CCM required this visit?  No      Plan:     I have personally reviewed and noted the following in the patient's chart:   Medical and social history Use of alcohol, tobacco or illicit drugs  Current medications and supplements including opioid prescriptions.  Functional ability and status Nutritional status Physical activity Advanced directives List of other physicians Hospitalizations, surgeries, and ER visits in previous 12 months Vitals Screenings to include cognitive, depression, and falls Referrals and appointments  In addition, I have reviewed and discussed with patient certain preventive protocols, quality metrics, and best practice recommendations. A written personalized care plan for preventive services as well as general preventive health recommendations were provided to patient.     Dionisio David, LPN   7/00/5259   Nurse Notes: none

## 2022-01-05 ENCOUNTER — Encounter: Payer: Self-pay | Admitting: Family Medicine

## 2022-01-05 ENCOUNTER — Ambulatory Visit (INDEPENDENT_AMBULATORY_CARE_PROVIDER_SITE_OTHER): Payer: Medicare HMO | Admitting: Family Medicine

## 2022-01-05 VITALS — BP 120/80 | HR 62 | Temp 97.7°F | Ht 62.0 in | Wt 164.2 lb

## 2022-01-05 DIAGNOSIS — N1831 Chronic kidney disease, stage 3a: Secondary | ICD-10-CM | POA: Diagnosis not present

## 2022-01-05 DIAGNOSIS — M797 Fibromyalgia: Secondary | ICD-10-CM | POA: Diagnosis not present

## 2022-01-05 DIAGNOSIS — I152 Hypertension secondary to endocrine disorders: Secondary | ICD-10-CM

## 2022-01-05 DIAGNOSIS — E1159 Type 2 diabetes mellitus with other circulatory complications: Secondary | ICD-10-CM

## 2022-01-05 DIAGNOSIS — J449 Chronic obstructive pulmonary disease, unspecified: Secondary | ICD-10-CM | POA: Diagnosis not present

## 2022-01-05 DIAGNOSIS — E1122 Type 2 diabetes mellitus with diabetic chronic kidney disease: Secondary | ICD-10-CM

## 2022-01-05 DIAGNOSIS — E1169 Type 2 diabetes mellitus with other specified complication: Secondary | ICD-10-CM

## 2022-01-05 DIAGNOSIS — Z0000 Encounter for general adult medical examination without abnormal findings: Secondary | ICD-10-CM

## 2022-01-05 DIAGNOSIS — T466X5A Adverse effect of antihyperlipidemic and antiarteriosclerotic drugs, initial encounter: Secondary | ICD-10-CM

## 2022-01-05 DIAGNOSIS — G720 Drug-induced myopathy: Secondary | ICD-10-CM | POA: Diagnosis not present

## 2022-01-05 DIAGNOSIS — K8689 Other specified diseases of pancreas: Secondary | ICD-10-CM | POA: Diagnosis not present

## 2022-01-05 DIAGNOSIS — N183 Chronic kidney disease, stage 3 unspecified: Secondary | ICD-10-CM

## 2022-01-05 DIAGNOSIS — E785 Hyperlipidemia, unspecified: Secondary | ICD-10-CM

## 2022-01-05 NOTE — Assessment & Plan Note (Signed)
Stable, chronic.  Continue current medication.   Lisinopril 10 mg p.o. daily Metoprolol tart Tate 50 mg p.o. twice daily

## 2022-01-05 NOTE — Assessment & Plan Note (Signed)
Chronic, due to diabetes Stable control with GFR at 48

## 2022-01-05 NOTE — Assessment & Plan Note (Signed)
Chronic, she is feeling much better on pancreatic enzymes.

## 2022-01-05 NOTE — Assessment & Plan Note (Signed)
Intolerant of statins in the past.

## 2022-01-05 NOTE — Assessment & Plan Note (Signed)
Chronic, stable control using tramadol 50 mg 4 times daily.

## 2022-01-05 NOTE — Assessment & Plan Note (Signed)
Chronic well-controlled on metformin XR 500 mg twice daily  Associated with renal complications.

## 2022-01-05 NOTE — Progress Notes (Signed)
Patient ID: Cathy Mullins, female    DOB: November 04, 1945, 76 y.o.   MRN: [id number]  This visit was conducted in person.  BP 120/80 (BP Location: Left Arm, Patient Position: Sitting, Cuff Size: Normal)   Pulse 62   Temp 97.7 F (36.5 C) (Oral)   Ht _0  (1.575 m)   Wt 164 lb 3.2 oz (74.5 kg)   SpO2 99%   BMI 30.03 kg/m    CC:  annual, number 2    Subjective:   HPI: Cathy Mullins is a 76 y.o. female presenting on 01/05/2022 for he patient presents for complete physical and review of chronic health problems. He/She also has the following acute concerns today:  The patient saw a LPN or RN for medicare wellness visit.  Prevention and wellness was reviewed in detail. Note reviewed and important notes copied below.  Diabetes: Chronic well-controlled on metformin XR 500 mg twice daily Lab Results  Component Value Date   HGBA1C 6.2 12/29/2021  Associated with chronic kidney disease. Using medications without difficulties: Hypoglycemic episodes: none Hyperglycemic episodes: none Feet problems: none Blood Sugars averaging: fbs 85-100 eye exam within last year: yes  Hypertension:  Excellent control on Lisinopril 10 mg p.o. daily Metoprolol tartrate 50 mg p.o. twice daily BP Readings from Last 3 Encounters:  01/05/22 120/80  10/06/21 132/86  06/29/21 (!) 137/58  Using medication without problems or lightheadedness:  none Chest pain with exertion: none Edema: none Maguire of breath:  intermittent Average home BPs: 130/70 Other issues:  Elevated Cholesterol:  Improved control  but not at goal on  no medication.. refused statins give SE in past. Does not want to take zetia or welchol. Lab Results  Component Value Date   CHOL 239 (H) 12/29/2021   HDL 59.60 12/29/2021   LDLCALC 149 (H) 12/29/2021   LDLDIRECT 166.0 12/19/2020   TRIG 151.0 (H) 12/29/2021   CHOLHDL 4 12/29/2021  Using medications without problems: Muscle aches:  Diet compliance: more salads. Exercise: limited  given chronic back pain/fibromyalgia.Marland Kitchen using tramadol 50 mg Fourtimes daily. Helps her sleep  Other complaints:  COPD: Mild, stable control using albuterol as needed      Relevant past medical, surgical, family and social history reviewed and updated as indicated. Interim medical history since our last visit reviewed. Allergies and medications reviewed and updated. Outpatient Medications Prior to Visit  Medication Sig Dispense Refill   Accu-Chek Softclix Lancets lancets TEST BLOOD SUGAR TWICE DAILY 200 each 3   albuterol (PROVENTIL HFA;VENTOLIN HFA) 108 (90 BASE) MCG/ACT inhaler 2 puffs every 4 hours as needed for wheezing. 1 Inhaler 0   Alcohol Swabs (DROPSAFE ALCOHOL PREP) 70 % PADS USE TO CHECK BLOOD SUGAR TWO TIMES A DAY 200 each 3   Blood Glucose Calibration (ACCU-CHEK AVIVA) SOLN Use to check controls on glucose meter 1 each 3   Blood Glucose Monitoring Suppl (ACCU-CHEK AVIVA PLUS) w/Device KIT Use to check blood sugar two times a day 1 kit 0   desipramine (NORPRAMIN) 25 MG tablet TAKE 1 TABLET AT BEDTIME 90 tablet 1   diclofenac sodium (VOLTAREN) 1 % GEL APPLY 2 GRAMS TOPICALLY FOUR TIMES A DAY AS NEEDED 300 g 1   glucose blood (ACCU-CHEK AVIVA PLUS) test strip TEST BLOOD SUGAR TWICE DAILY 200 strip 0   latanoprost (XALATAN) 0.005 % ophthalmic solution Place 1 drop into both eyes at bedtime.     lisinopril (ZESTRIL) 10 MG tablet Take 1 tablet (10 mg total) by mouth daily.  90 tablet 3   Melatonin 10 MG CAPS Take by mouth.     metFORMIN (GLUCOPHAGE-XR) 500 MG 24 hr tablet TAKE  1 tablet twice daily 270 tablet 0   metoprolol tartrate (LOPRESSOR) 50 MG tablet TAKE 1 TABLET TWICE DAILY 180 tablet 1   Multiple Vitamins-Minerals (CENTRUM ADULTS PO) Take 1 tablet by mouth daily.     neomycin-bacitracin-polymyxin (NEOSPORIN) ointment Apply 1 application topically as needed for wound care.      Pancrelipase, Lip-Prot-Amyl, (ZENPEP) 40000-126000 units CPEP Take 2 capsules with each meal and 1  capsule with a snack 300 capsule 5   timolol (TIMOPTIC) 0.5 % ophthalmic solution Place 1 drop into both eyes daily.      traMADol (ULTRAM) 50 MG tablet TAKE 1 TABLET FOUR TIMES DAILY 120 tablet 0   finasteride (PROSCAR) 5 MG tablet Take 2.5 mg by mouth daily. (Patient not taking: Reported on 12/31/2021)     Facility-Administered Medications Prior to Visit  Medication Dose Route Frequency Provider Last Rate Last Admin   betamethasone acetate-betamethasone sodium phosphate (CELESTONE) injection 3 mg  3 mg Intramuscular Once Edrick Kins, DPM         Per HPI unless specifically indicated in ROS section below Review of Systems  Constitutional:  Negative for fatigue and fever.  HENT:  Negative for congestion.   Eyes:  Negative for pain.  Respiratory:  Negative for cough and shortness of breath.   Cardiovascular:  Negative for chest pain, palpitations and leg swelling.  Gastrointestinal:  Negative for abdominal pain.  Genitourinary:  Negative for dysuria and vaginal bleeding.  Musculoskeletal:  Negative for back pain.  Neurological:  Negative for syncope, light-headedness and headaches.  Psychiatric/Behavioral:  Negative for dysphoric mood.   Objective:  BP 120/80 (BP Location: Left Arm, Patient Position: Sitting, Cuff Size: Normal)   Pulse 62   Temp 97.7 F (36.5 C) (Oral)   Ht _0  (1.575 m)   Wt 164 lb 3.2 oz (74.5 kg)   SpO2 99%   BMI 30.03 kg/m   Wt Readings from Last 3 Encounters:  01/05/22 164 lb 3.2 oz (74.5 kg)  12/31/21 166 lb (75.3 kg)  10/06/21 166 lb 6.4 oz (75.5 kg)      Physical Exam Vitals and nursing note reviewed.  Constitutional:      General: She is not in acute distress.    Appearance: Normal appearance. She is well-developed. She is not ill-appearing or toxic-appearing.  HENT:     Head: Normocephalic.     Right Ear: Hearing, tympanic membrane, ear canal and external ear normal.     Left Ear: Hearing, tympanic membrane, ear canal and external ear  normal.     Nose: Nose normal.  Eyes:     General: Lids are normal. Lids are everted, no foreign bodies appreciated.     Conjunctiva/sclera: Conjunctivae normal.     Pupils: Pupils are equal, round, and reactive to light.  Neck:     Thyroid: No thyroid mass or thyromegaly.     Vascular: No carotid bruit.     Trachea: Trachea normal.  Cardiovascular:     Rate and Rhythm: Normal rate and regular rhythm.     Heart sounds: Normal heart sounds, S1 normal and S2 normal. No murmur heard.   No gallop.  Pulmonary:     Effort: Pulmonary effort is normal. No respiratory distress.     Breath sounds: Normal breath sounds. No wheezing, rhonchi or rales.  Abdominal:  General: Bowel sounds are normal. There is no distension or abdominal bruit.     Palpations: Abdomen is soft. There is no fluid wave or mass.     Tenderness: There is no abdominal tenderness. There is no guarding or rebound.     Hernia: No hernia is present.  Musculoskeletal:     Cervical back: Normal range of motion and neck supple.  Lymphadenopathy:     Cervical: No cervical adenopathy.  Skin:    General: Skin is warm and dry.     Findings: No rash.  Neurological:     Mental Status: She is alert.     Cranial Nerves: No cranial nerve deficit.     Sensory: No sensory deficit.  Psychiatric:        Mood and Affect: Mood is not anxious or depressed.        Speech: Speech normal.        Behavior: Behavior normal. Behavior is cooperative.        Judgment: Judgment normal.      Results for orders placed or performed in visit on 12/29/21  Comprehensive metabolic panel  Result Value Ref Range   Sodium 134 (L) 135 - 145 mEq/L   Potassium 4.5 3.5 - 5.1 mEq/L   Chloride 100 96 - 112 mEq/L   CO2 29 19 - 32 mEq/L   Glucose, Bld 107 (H) 70 - 99 mg/dL   BUN 24 (H) 6 - 23 mg/dL   Creatinine, Ser 1.14 0.40 - 1.20 mg/dL   Total Bilirubin 0.3 0.2 - 1.2 mg/dL   Alkaline Phosphatase 53 39 - 117 U/L   AST 28 0 - 37 U/L   ALT 39 (H) 0  - 35 U/L   Total Protein 7.2 6.0 - 8.3 g/dL   Albumin 4.2 3.5 - 5.2 g/dL   GFR 47.03 (L) >60.00 mL/min   Calcium 9.7 8.4 - 10.5 mg/dL  Lipid panel  Result Value Ref Range   Cholesterol 239 (H) 0 - 200 mg/dL   Triglycerides 151.0 (H) 0.0 - 149.0 mg/dL   HDL 59.60 >39.00 mg/dL   VLDL 30.2 0.0 - 40.0 mg/dL   LDL Cholesterol 149 (H) 0 - 99 mg/dL   Total CHOL/HDL Ratio 4    NonHDL 179.35   Hemoglobin A1c  Result Value Ref Range   Hgb A1c MFr Bld 6.2 4.6 - 6.5 %     COVID 19 screen:  No recent travel or known exposure to COVID19 The patient denies respiratory symptoms of COVID 19 at this time. The importance of social distancing was discussed today.   Assessment and Plan The patient's preventative maintenance and recommended screening tests for an annual wellness exam were reviewed in full today. Brought up to date unless services declined.  Counselled on the importance of diet, exercise, and its role in overall health and mortality. The patient's FH and SH was reviewed, including their home life, tobacco status, and drug and alcohol status.   DXA: 12/2015 nml, repeat in 5 years.. she is not interested in bone density Mammo: nml 10/2021, mother with breast cancer PAP/DVE: not indicated s/p abdominal hysterectomy, right ovary remaining. Sees Dr. Kennon Rounds for GYN for lichen sclerosis but has not seen in a while. No family history of ovarian cancer. She has chosen to stop DVEs.   Vaccines:Up to date with PNA 13, 23 and planning shingles vaccine, tdap due.   Has received COVID vaccine Colon cancer screening: colonoscopy  Dr Allen Norris Nml 06/2021 Hep C:  done Nonsmoker  Urine microalbumin:  uptodate  Problem List Items Addressed This Visit     CKD stage 3 due to type 2 diabetes mellitus (HCC)    Chronic, due to diabetes Stable control with GFR at 48       COPD without exacerbation (HCC)    Mild, stable control using albuterol as needed      DM (diabetes mellitus), type 2 with renal  complications (HCC)    Chronic well-controlled on metformin XR 500 mg twice daily  Associated with renal complications.      Fibromyalgia    Chronic, stable control using tramadol 50 mg 4 times daily.       Hyperlipidemia associated with type 2 diabetes mellitus (HCC)    Neck, poor control despite WelChol max dose.  She is statin intolerant.       Hypertension associated with diabetes (HCC)    Stable, chronic.  Continue current medication.   Lisinopril 10 mg p.o. daily Metoprolol tart Tate 50 mg p.o. twice daily      Pancreatic insufficiency    Chronic, she is feeling much better on pancreatic enzymes.       Statin myopathy    Intolerant of statins in the past.       Other Visit Diagnoses     Routine general medical examination at a health care facility    -  Primary        Eliezer Lofts, MD

## 2022-01-05 NOTE — Assessment & Plan Note (Signed)
Neck, poor control despite WelChol max dose.  She is statin intolerant.

## 2022-01-05 NOTE — Assessment & Plan Note (Signed)
Mild, stable control using albuterol as needed

## 2022-01-18 ENCOUNTER — Telehealth: Payer: Self-pay | Admitting: Family Medicine

## 2022-01-18 NOTE — Telephone Encounter (Signed)
Rep from [facility] called to find out if patient could be seen virtually tomorrow. She is complaining of hip pain and is only seeing Dr. Diona Browner for the referral.

## 2022-01-18 NOTE — Telephone Encounter (Signed)
Hip pain should be see in person if possible. Why is she asking for virtual?

## 2022-01-19 ENCOUNTER — Other Ambulatory Visit: Payer: Self-pay | Admitting: Family Medicine

## 2022-01-19 ENCOUNTER — Ambulatory Visit: Payer: Medicare HMO | Admitting: Family Medicine

## 2022-01-19 MED ORDER — TRAMADOL HCL 50 MG PO TABS: ORAL_TABLET | ORAL | 0 refills | Status: DC

## 2022-01-19 NOTE — Telephone Encounter (Signed)
Last refill 09/29/21 #120 Last office visit 01/05/22

## 2022-01-19 NOTE — Telephone Encounter (Signed)
Patient notified as instructed by telephone and verbalized understanding. Patient stated that she called and cancelled the appointment today because she was busy. Patient stated that she has a busy schedule and not able to come in until maybe next week. Patient stated that she is not at home and will call back after checking her schedule if the hip pain continues.

## 2022-01-19 NOTE — Telephone Encounter (Signed)
Patient needs a refill of tramadol. She uses [facility] mail order pharmacy.

## 2022-01-28 DIAGNOSIS — H401131 Primary open-angle glaucoma, bilateral, mild stage: Secondary | ICD-10-CM | POA: Diagnosis not present

## 2022-02-05 ENCOUNTER — Other Ambulatory Visit: Payer: Self-pay | Admitting: Family Medicine

## 2022-02-05 DIAGNOSIS — N1831 Chronic kidney disease, stage 3a: Secondary | ICD-10-CM

## 2022-02-16 ENCOUNTER — Telehealth: Payer: Self-pay | Admitting: Family Medicine

## 2022-02-16 ENCOUNTER — Other Ambulatory Visit: Payer: Self-pay | Admitting: Family Medicine

## 2022-02-16 MED ORDER — DICLOFENAC SODIUM 75 MG PO TBEC: 75.0000 mg | DELAYED_RELEASE_TABLET | Freq: Two times a day (BID) | ORAL | 0 refills | Status: DC

## 2022-02-16 MED ORDER — TRAMADOL HCL 50 MG PO TABS: ORAL_TABLET | ORAL | 0 refills | Status: DC

## 2022-02-16 NOTE — Telephone Encounter (Signed)
Patient called and said that her hip and spine pain is much worse, she said its at an 8 out of 10, she said its hard for her to get in out of bed or walk. She cant get in and out of car and her husband has to open drawers for he because she cant bend over. The left hip is in pain always to the back of knee and back of thigh. Pt knows its inflammation but doesn't know which part is inflamed. She wants to know If Dr Diona Browner will send in som anti inflammatory medication. Please call back at [phone]

## 2022-02-16 NOTE — Telephone Encounter (Signed)
Last office visit 01/05/22 for CPE.  Last refilled 01/19/22 for #120 with no refills.  No future appointments with PCP.

## 2022-02-16 NOTE — Telephone Encounter (Signed)
Caller Name: Shereda Graw Call back phone #: [phone]  MEDICATION(S): Tramadol   Days of Med Remaining: about a week. 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  No controlled IF YES, when and what did the pharmacy advise?  IF NO, request that the patient contact the pharmacy for the refills in the future.             The pharmacy will send an electronic request (except for controlled medications).  Preferred Pharmacy: [facility]  ~~~Please advise patient/caregiver to allow 2-3 business days to process RX refills.

## 2022-02-16 NOTE — Telephone Encounter (Addendum)
I spoke with pt; pt said that having bad spinal stenosis with more back and lt hip pain. Lt hip hurting on and off for 2 years but now the pain is more consistent and the pain is worse. Pt cannot put a number on pain level. Pt said the back pain radiates to lt panty line and the lt hip pain radiates to lt panty line also. Pt said has constant ache in back and lt hip but with movement pain worses and is a sharp rather than dull pain. Pt has not seen neurosurgeon in over 1 year. Per 01/18/22 phone note Dr Diona Browner said for hip pain needs to be an in office visit.offered pt more than one appt at [facility] but pt declined due to not being able to get in and out of car. Pt said she cannot get in and out of a car. Pt said the other night pain was so bad she almost went to ED by ambulance but pt did not call EMS. Pt said to see neurosurgeon at [facility] would need referral but pt cannot go to neurosurgeon either due to pain getting in and out of car. Pt also wanted Dr Diona Browner to know there is possible pending strike with either [employer] or [employer] who would deliver meds and that is why pt has requested refill of Tramadol, (separate request). Pt last seen 01/05/2022. Pt request cb after reviewed by Dr Diona Browner. Sending note to Dr Diona Browner and Butch Penny CMA and will teams Butch Penny. UC & ED precautions given and pt voiced understanding.[facility]

## 2022-02-16 NOTE — Telephone Encounter (Signed)
I will send in a prescription for an anti-inflammatory for her to try.  Of note I did refill her tramadol.  If the anti-inflammatory does not help with her pain she definitely needs to be seen in person either here or at orthopedics/neurosurgery.  Sent in prescription for diclofenac 75 mg p.o. twice daily.  Make sure she takes it on a full stomach and stops if she has any upset stomach.

## 2022-02-16 NOTE — Telephone Encounter (Signed)
Please triage

## 2022-02-17 NOTE — Telephone Encounter (Signed)
Mr. Hogston notified as instructed by telephone.  Patient states understanding.

## 2022-03-03 IMAGING — MR MR LUMBAR SPINE W/O CM
5 series · 30 of 48 positions shown · non-contrast
Comparison: MRI of the lumbar spine October 17, 2011.

CLINICAL DATA: Low back pain

EXAM:
MRI LUMBAR SPINE WITHOUT CONTRAST
TECHNIQUE: Multiplanar, multisequence MR imaging of the lumbar spine was
performed. No intravenous contrast was administered.

[Series 9: T2 · sagittal · 4.0mm · 0.81mm/px · 6 of 17 slices shown (1 of 2)]
[im 1/17]
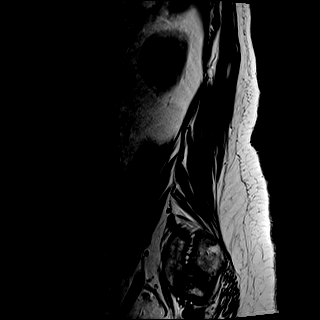
[im 4/17]
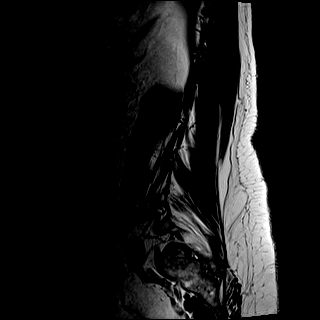
[im 7/17]
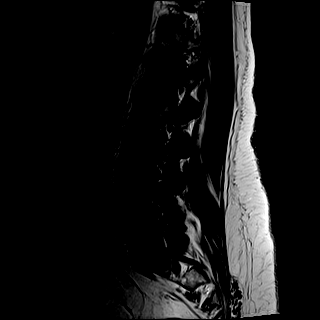
[im 10/17]
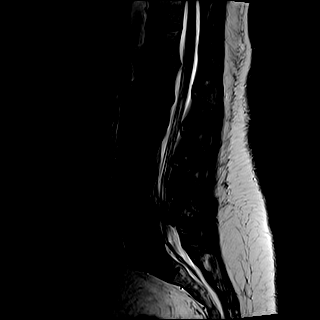
[im 13/17]
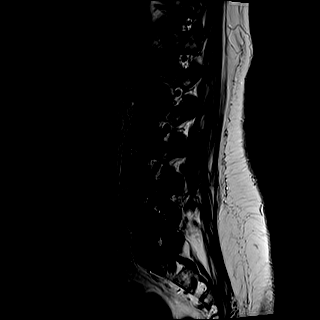
[im 17/17]
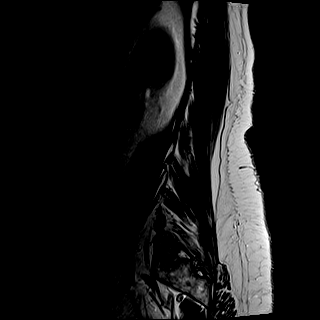

[Series 10: T1 · sagittal · 4.0mm · 0.81mm/px · 7 of 17 slices shown (1 of 2)]
[im 1/17]
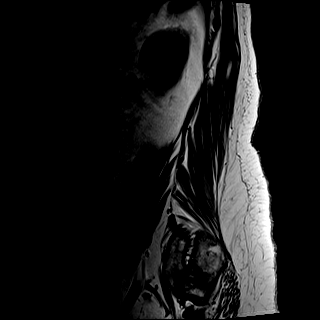
[im 3/17]
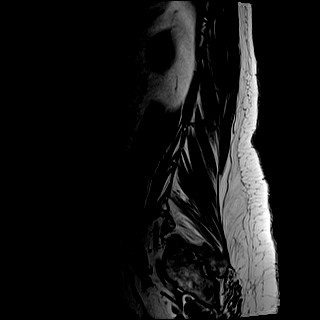
[im 6/17]
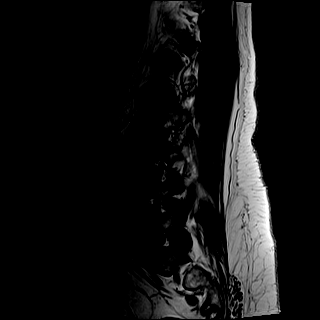
[im 9/17]
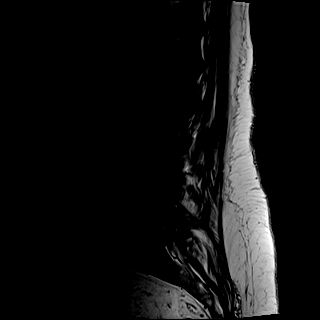
[im 11/17]
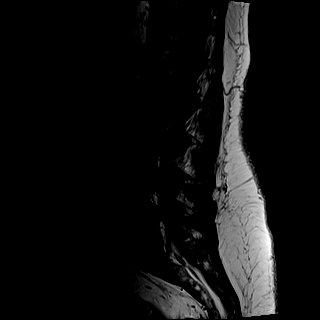
[im 14/17]
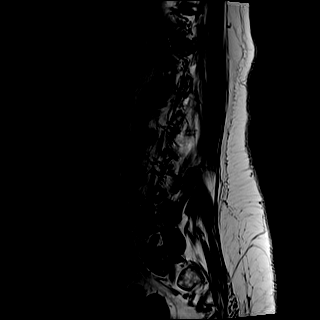
[im 17/17]
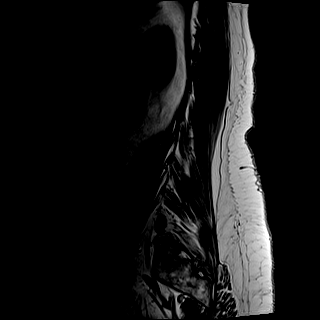

[Series 11: STIR · sagittal · 4.0mm · 0.41mm/px · 1 of 17 slices shown]
[im 1/17]
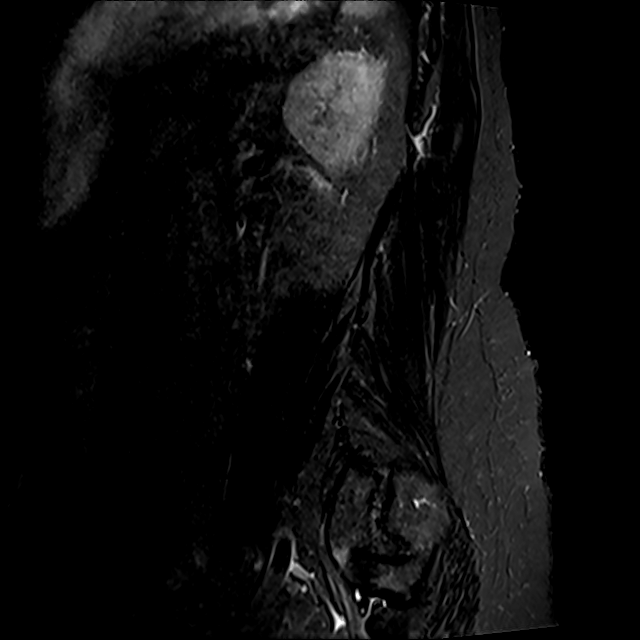

[Series 12: T2 · axial · 4.0mm · 0.78mm/px · z∈[+56,+265]mm · 8 of 36 slices shown (2 of 2)]
[im 1/36]
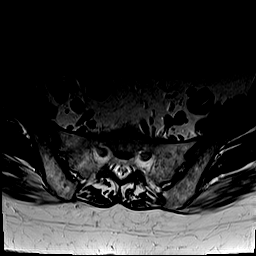
[im 6/36]
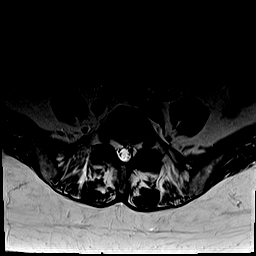
[im 11/36]
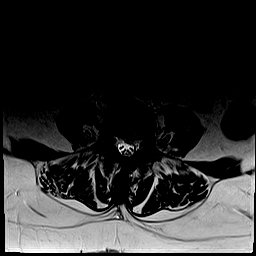
[im 17/36]
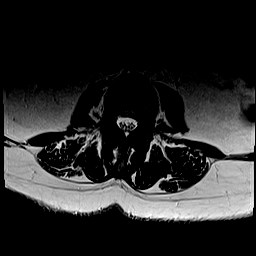
[im 19/36]
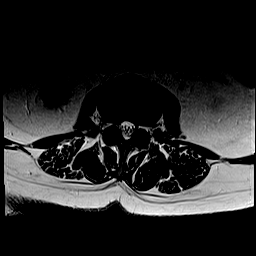
[im 25/36]
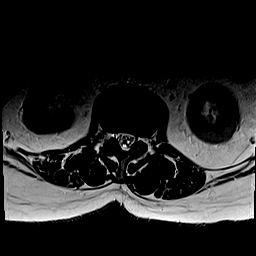
[im 30/36]
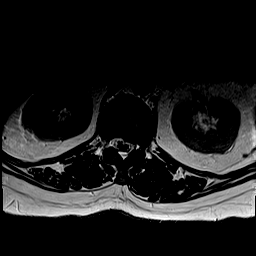
[im 36/36]
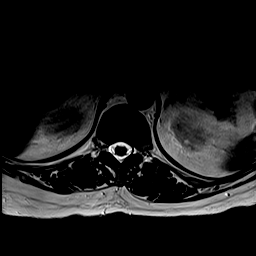

[Series 13: T1 · axial · 4.0mm · 0.39mm/px · z∈[+56,+265]mm · 8 of 36 slices shown (2 of 2)]
[im 1/36]
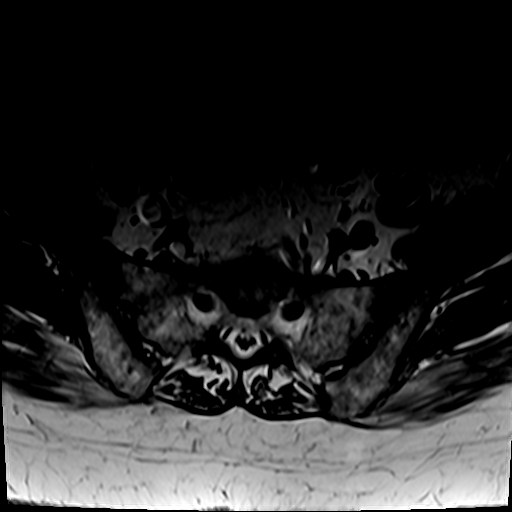
[im 6/36]
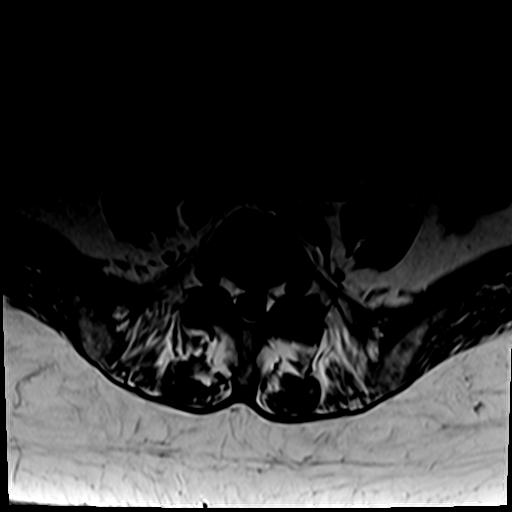
[im 11/36]
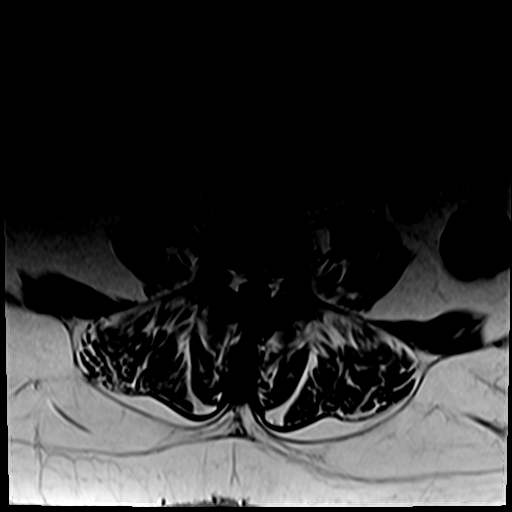
[im 17/36]
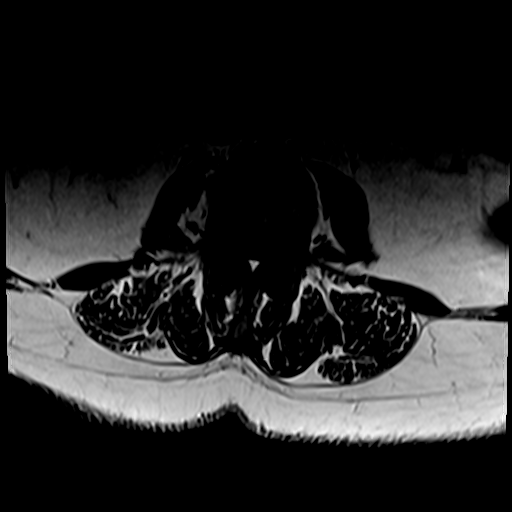
[im 19/36]
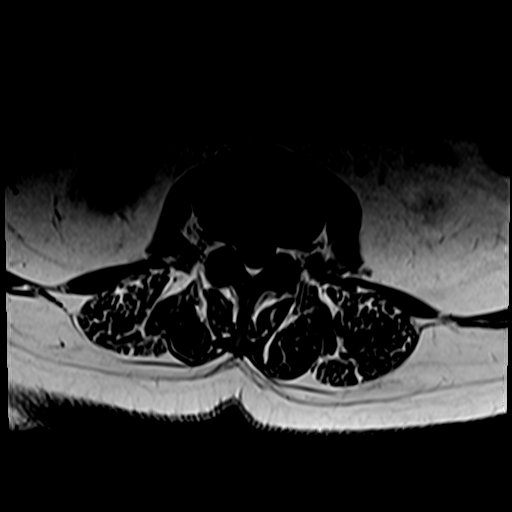
[im 25/36]
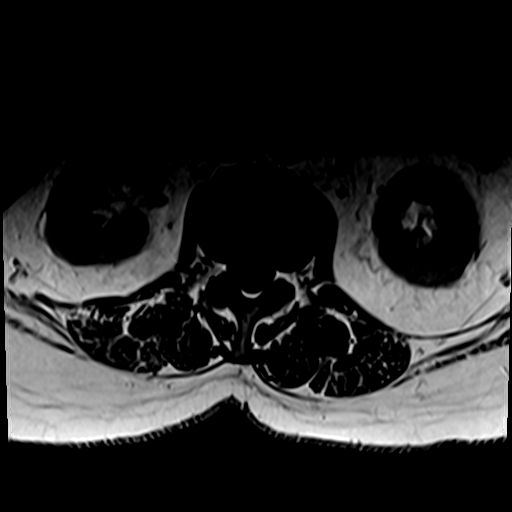
[im 30/36]
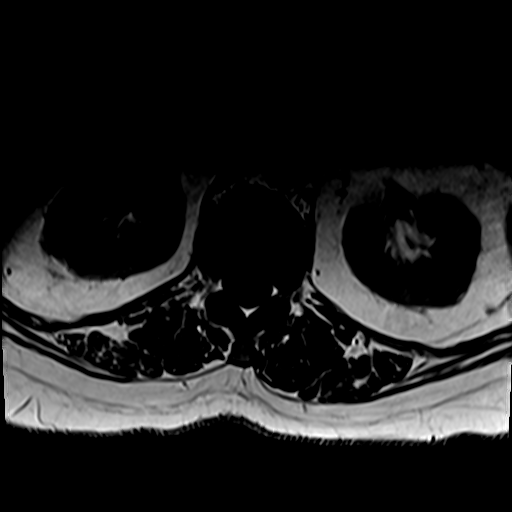
[im 36/36]
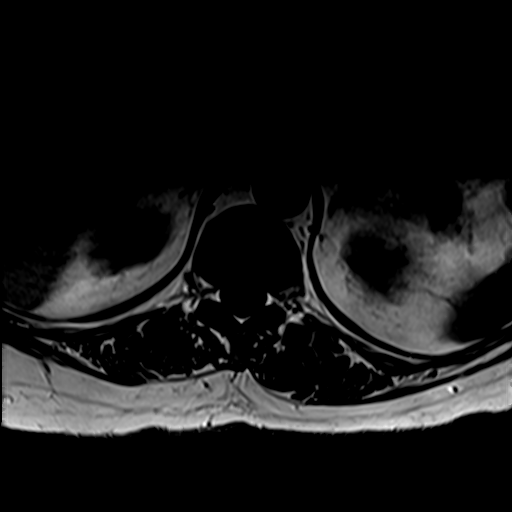

[30 of 48 positions shown; findings below may reference images not displayed]

FINDINGS: Segmentation:  Standard.

Alignment:  Grade 1 anterolisthesis of L4 over L5.

Vertebrae:  No fracture, evidence of discitis, or bone lesion.

Conus medullaris and cauda equina: Conus extends to the L1 level.
Conus and cauda equina appear normal.

Paraspinal and other soft tissues: Negative.

Disc levels:

T12-L1: Left central disc protrusion causing indentation of the
thecal sac without significant spinal canal or neural foraminal
stenosis.

L1-2: Shallow disc bulge and moderate facet degenerative changes
without significant spinal canal or neural foraminal stenosis.

L2-3: Right foraminal/subarticular disc extrusion migrating
superiorly, causing indentation of the thecal sac, likely contacting
the exiting right L2 nerve root and resulting in mild right neural
foraminal stenosis. No significant spinal canal stenosis.

L3-4: Shallow disc bulge, mild facet degenerative changes and
ligamentum flavum redundancy resulting in mild spinal canal stenosis
mild bilateral neural foraminal narrowing.

L4-5: Disc bulge/disc uncovering, advanced hypertrophic facet
degenerative changes and ligamentum flavum redundancy resulting in
severe spinal canal stenosis and mild bilateral neural foraminal
narrowing.

L5-S1: Shallow disc bulge and mild facet degenerative changes. No
spinal canal or neural foraminal stenosis.

Significant progression of degenerative disc disease since prior
MRI, particularly at L4-5.
IMPRESSION: 1. Multilevel degenerative changes of the lumbar spine as described
above, worst at L4-5 where there is severe spinal canal stenosis and
mild bilateral neural foraminal narrowing.
2. Right foraminal/subarticular disc extrusion at L2-3 migrating
superiorly, likely contacting the exiting right L2 nerve root and
resulting in mild right neural foraminal stenosis.
3. Mild spinal canal stenosis and mild bilateral neural foraminal
narrowing at L3-4.

## 2022-03-20 ENCOUNTER — Telehealth: Payer: Self-pay | Admitting: Gastroenterology

## 2022-03-20 DIAGNOSIS — K648 Other hemorrhoids: Secondary | ICD-10-CM

## 2022-03-20 MED ORDER — HYDROCORTISONE (PERIANAL) 2.5 % EX CREA: 1.0000 | TOPICAL_CREAM | Freq: Two times a day (BID) | CUTANEOUS | 1 refills | Status: DC

## 2022-03-20 NOTE — Telephone Encounter (Signed)
Received a page from page operator that patient wanted to speak to the GI on-call.  Ms. Cathy Mullins states that she is diagnosed with EPI and started on Creon.  This has led to severe constipation, she has been having very hard stools, pebbles like associated with significant straining, was taking over-the-counter stool softeners and finally had a bowel movement and she thinks that she cleaned out pretty good.  However, she started experiencing bright red blood per rectum which she describes as thick and sinking to the bottom of the toilet with rectal discomfort.  She had a colonoscopy 06/2021 which revealed large internal hemorrhoids.  Patient is likely bleeding from internal hemorrhoids.  Advised her to try Anusol cream pea-sized per rectum, [facility] [place], confirmed with patient.  I also offered her outpatient hemorrhoid ligation with me and she is willing to proceed  Advised her to go to the emergency room if her bleeding continues.  Advised her to take MiraLAX daily along with fiber supplement such as Benefiber or Citrucel or fiber choice.  Walked her through high-fiber diet as well.  Advised her to hold off on the Creon for now since her EPI is mild  Patient expressed understanding of the plan  Cephas Darby, MD [facility], [facility] [address]  [place]  [place], [place] [address]  Main: [phone]  Fax: [phone] Pager: [phone]

## 2022-03-22 ENCOUNTER — Telehealth: Payer: Self-pay

## 2022-03-22 NOTE — Telephone Encounter (Signed)
-----   Message from Lin Landsman, MD sent at 03/20/2022 10:04 AM EDT ----- Regarding: Hemorrhoid banding This is the patient of Dr. Allen Norris, please schedule hemorrhoid banding  RV

## 2022-03-22 NOTE — Telephone Encounter (Signed)
Called and left a message for call back  

## 2022-03-22 NOTE — Telephone Encounter (Signed)
Patient states to call the home number. She states that she did the recommendations that you recommended at the hospital and has not had anymore bleeding. She would like to wait on the hemorrhoid banding at this time. She will call back if she has any problems

## 2022-03-27 ENCOUNTER — Other Ambulatory Visit: Payer: Self-pay | Admitting: Family Medicine

## 2022-03-29 NOTE — Telephone Encounter (Signed)
Refill request Tramadol Last refill 02/16/22 #120 Last office visit 01/05/22

## 2022-03-31 ENCOUNTER — Telehealth: Payer: Self-pay

## 2022-03-31 NOTE — Telephone Encounter (Signed)
ERROR

## 2022-05-08 ENCOUNTER — Other Ambulatory Visit: Payer: Self-pay | Admitting: Family Medicine

## 2022-06-29 ENCOUNTER — Ambulatory Visit: Payer: Medicare HMO | Admitting: Family Medicine

## 2022-07-06 ENCOUNTER — Encounter: Payer: Self-pay | Admitting: Family Medicine

## 2022-07-06 ENCOUNTER — Ambulatory Visit (INDEPENDENT_AMBULATORY_CARE_PROVIDER_SITE_OTHER): Payer: Medicare HMO | Admitting: Family Medicine

## 2022-07-06 VITALS — BP 140/70 | HR 54 | Temp 98.0°F | Ht 62.0 in | Wt 165.0 lb

## 2022-07-06 DIAGNOSIS — M5441 Lumbago with sciatica, right side: Secondary | ICD-10-CM | POA: Diagnosis not present

## 2022-07-06 DIAGNOSIS — E1122 Type 2 diabetes mellitus with diabetic chronic kidney disease: Secondary | ICD-10-CM | POA: Diagnosis not present

## 2022-07-06 DIAGNOSIS — M5442 Lumbago with sciatica, left side: Secondary | ICD-10-CM

## 2022-07-06 DIAGNOSIS — D171 Benign lipomatous neoplasm of skin and subcutaneous tissue of trunk: Secondary | ICD-10-CM | POA: Insufficient documentation

## 2022-07-06 DIAGNOSIS — G8929 Other chronic pain: Secondary | ICD-10-CM

## 2022-07-06 DIAGNOSIS — Z23 Encounter for immunization: Secondary | ICD-10-CM | POA: Diagnosis not present

## 2022-07-06 DIAGNOSIS — E1159 Type 2 diabetes mellitus with other circulatory complications: Secondary | ICD-10-CM

## 2022-07-06 DIAGNOSIS — I152 Hypertension secondary to endocrine disorders: Secondary | ICD-10-CM

## 2022-07-06 DIAGNOSIS — N1831 Chronic kidney disease, stage 3a: Secondary | ICD-10-CM

## 2022-07-06 LAB — POCT GLYCOSYLATED HEMOGLOBIN (HGB A1C): Hemoglobin A1C: 5.8 % — AB (ref 4.0–5.6)

## 2022-07-06 MED ORDER — TRAMADOL HCL 50 MG PO TABS: 100.0000 mg | ORAL_TABLET | Freq: Two times a day (BID) | ORAL | 0 refills | Status: DC

## 2022-07-06 NOTE — Assessment & Plan Note (Addendum)
Chronic well controlled on metformin 500 mg XR twice daily.  No need to treat down to current A1C, 5.8.. recommend decreasing metformin further to metformin XR 500 mg once daily.   Associated with renal complications.

## 2022-07-06 NOTE — Assessment & Plan Note (Signed)
Chronic, doing home physical therapy. Tolerable control using tramadol 100 mg twice daily as needed for pain. Also on desipramine 25 mg at bedtime.

## 2022-07-06 NOTE — Assessment & Plan Note (Signed)
  Stable, chronic.  Continue current medication.  Lisinopril 10 mg daily, metoprolol tartrate 50 mg p.o. twice daily

## 2022-07-06 NOTE — Progress Notes (Signed)
Patient ID: Cathy Mullins, female    DOB: [birth year], 76 y.o.   MRN: [id number]  This visit was conducted in person.  BP (!) 140/70   Pulse (!) 54   Temp 98 F (36.7 C) (Oral)   Ht _0  (1.575 m)   Wt 165 lb (74.8 kg)   SpO2 98%   BMI 30.18 kg/m    CC:  Chief Complaint  Patient presents with   Diabetes    Subjective:   HPI: Cathy Mullins is a 76 y.o. female presenting on 07/06/2022 for Diabetes   Diabetes:  Continued improving control on lower dose metformin. Wt Readings from Last 3 Encounters:  07/06/22 165 lb (74.8 kg)  01/05/22 164 lb 3.2 oz (74.5 kg)  12/31/21 166 lb (75.3 kg)   Lab Results  Component Value Date   HGBA1C 5.8 (A) 07/06/2022  Using medications without difficulties: Hypoglycemic episodes: lowest is 73 Hyperglycemic episodes: none Feet problems: no ulcer Blood Sugars averaging: FBS 80-136 eye exam within last year: yes.  Hypertension:   Borderline control on  Lisinopril 10 mg daily, metoprolol tartrate 50 mg p.o. twice daily BP Readings from Last 3 Encounters:  07/06/22 (!) 140/70  01/05/22 120/80  10/06/21 132/86  Using medication without problems or lightheadedness: none Chest pain with exertion:none Edema:none Sendejo of breath: none Average home BPs: At goal Other issues:   She is using tramadol for pain in chronic low back  and chronic neck pain. She is not interested in surgery.       Relevant past medical, surgical, family and social history reviewed and updated as indicated. Interim medical history since our last visit reviewed. Allergies and medications reviewed and updated. Outpatient Medications Prior to Visit  Medication Sig Dispense Refill   Accu-Chek Softclix Lancets lancets TEST BLOOD SUGAR TWICE DAILY 200 each 3   Alcohol Swabs (DROPSAFE ALCOHOL PREP) 70 % PADS USE TO CHECK BLOOD SUGAR TWO TIMES A DAY 200 each 3   Blood Glucose Calibration (ACCU-CHEK AVIVA) SOLN Use to check controls on glucose meter 1 each 3   Blood  Glucose Monitoring Suppl (ACCU-CHEK AVIVA PLUS) w/Device KIT Use to check blood sugar two times a day 1 kit 0   desipramine (NORPRAMIN) 25 MG tablet TAKE 1 TABLET AT BEDTIME 90 tablet 1   diclofenac (VOLTAREN) 75 MG EC tablet Take 1 tablet (75 mg total) by mouth 2 (two) times daily. 30 tablet 0   diclofenac sodium (VOLTAREN) 1 % GEL APPLY 2 GRAMS TOPICALLY FOUR TIMES A DAY AS NEEDED 300 g 1   glucose blood (ACCU-CHEK AVIVA PLUS) test strip TEST BLOOD SUGAR TWO TIMES DAILY 200 strip 3   hydrocortisone (ANUSOL-HC) 2.5 % rectal cream Place 1 Application rectally 2 (two) times daily. 30 g 1   latanoprost (XALATAN) 0.005 % ophthalmic solution Place 1 drop into both eyes at bedtime.     lisinopril (ZESTRIL) 10 MG tablet TAKE 1 TABLET EVERY DAY 90 tablet 1   metFORMIN (GLUCOPHAGE-XR) 500 MG 24 hr tablet TAKE  1 tablet twice daily 270 tablet 0   metoprolol tartrate (LOPRESSOR) 50 MG tablet TAKE 1 TABLET TWICE DAILY 180 tablet 1   Multiple Vitamins-Minerals (CENTRUM ADULTS PO) Take 1 tablet by mouth daily.     neomycin-bacitracin-polymyxin (NEOSPORIN) ointment Apply 1 application topically as needed for wound care.      Pancrelipase, Lip-Prot-Amyl, (ZENPEP) 40000-126000 units CPEP Take 2 capsules with each meal and 1 capsule with a snack 300 capsule 5  timolol (TIMOPTIC) 0.5 % ophthalmic solution Place 1 drop into both eyes daily.      traMADol (ULTRAM) 50 MG tablet TAKE 1 TABLET FOUR TIMES DAILY 120 tablet 0   Melatonin 10 MG CAPS Take by mouth.     albuterol (PROVENTIL HFA;VENTOLIN HFA) 108 (90 BASE) MCG/ACT inhaler 2 puffs every 4 hours as needed for wheezing. 1 Inhaler 0   Facility-Administered Medications Prior to Visit  Medication Dose Route Frequency Provider Last Rate Last Admin   betamethasone acetate-betamethasone sodium phosphate (CELESTONE) injection 3 mg  3 mg Intramuscular Once Edrick Kins, DPM         Per HPI unless specifically indicated in ROS section below Review of  Systems Objective:  BP (!) 140/70   Pulse (!) 54   Temp 98 F (36.7 C) (Oral)   Ht _0  (1.575 m)   Wt 165 lb (74.8 kg)   SpO2 98%   BMI 30.18 kg/m   Wt Readings from Last 3 Encounters:  07/06/22 165 lb (74.8 kg)  01/05/22 164 lb 3.2 oz (74.5 kg)  12/31/21 166 lb (75.3 kg)      Physical Exam Constitutional:      General: She is not in acute distress.    Appearance: Normal appearance. She is well-developed. She is not ill-appearing or toxic-appearing.  HENT:     Head: Normocephalic.     Right Ear: Hearing, tympanic membrane, ear canal and external ear normal. Tympanic membrane is not erythematous, retracted or bulging.     Left Ear: Hearing, tympanic membrane, ear canal and external ear normal. Tympanic membrane is not erythematous, retracted or bulging.     Nose: No mucosal edema or rhinorrhea.     Right Sinus: No maxillary sinus tenderness or frontal sinus tenderness.     Left Sinus: No maxillary sinus tenderness or frontal sinus tenderness.     Mouth/Throat:     Pharynx: Uvula midline.  Eyes:     General: Lids are normal. Lids are everted, no foreign bodies appreciated.     Conjunctiva/sclera: Conjunctivae normal.     Pupils: Pupils are equal, round, and reactive to light.  Neck:     Thyroid: No thyroid mass or thyromegaly.     Vascular: No carotid bruit.     Trachea: Trachea normal.  Cardiovascular:     Rate and Rhythm: Normal rate and regular rhythm.     Pulses: Normal pulses.     Heart sounds: Normal heart sounds, S1 normal and S2 normal. No murmur heard.    No friction rub. No gallop.  Pulmonary:     Effort: Pulmonary effort is normal. No tachypnea or respiratory distress.     Breath sounds: Normal breath sounds. No decreased breath sounds, wheezing, rhonchi or rales.  Abdominal:     General: Bowel sounds are normal.     Palpations: Abdomen is soft.     Tenderness: There is no abdominal tenderness.  Musculoskeletal:     Cervical back: Normal range of motion  and neck supple.  Skin:    General: Skin is warm and dry.     Findings: No rash.     Comments: Left upper quadrant abdomen, large soft fatty tissue accumulation with some areas of nodularity feeling like calcification.,  No redness, no tenderness to palpation  Neurological:     Mental Status: She is alert.  Psychiatric:        Mood and Affect: Mood is not anxious or depressed.  Speech: Speech normal.        Behavior: Behavior normal. Behavior is cooperative.        Thought Content: Thought content normal.        Judgment: Judgment normal.       Results for orders placed or performed in visit on 07/06/22  POCT glycosylated hemoglobin (Hb A1C)  Result Value Ref Range   Hemoglobin A1C 5.8 (A) 4.0 - 5.6 %   HbA1c POC (<> result, manual entry)     HbA1c, POC (prediabetic range)     HbA1c, POC (controlled diabetic range)       COVID 19 screen:  No recent travel or known exposure to COVID19 The patient denies respiratory symptoms of COVID 19 at this time. The importance of social distancing was discussed today.   Assessment and Plan    Problem List Items Addressed This Visit     Chronic low back pain    Chronic, doing home physical therapy. Tolerable control using tramadol 100 mg twice daily as needed for pain. Also on desipramine 25 mg at bedtime.       Relevant Medications   traMADol (ULTRAM) 50 MG tablet   DM (diabetes mellitus), type 2 with renal complications (HCC) - Primary    Chronic well controlled on metformin 500 mg XR twice daily.  No need to treat down to current A1C, 5.8.. recommend decreasing metformin further to metformin XR 500 mg once daily.   Associated with renal complications.        Relevant Orders   POCT glycosylated hemoglobin (Hb A1C) (Completed)   Hypertension associated with diabetes (HCC)     Stable, chronic.  Continue current medication.  Lisinopril 10 mg daily, metoprolol tartrate 50 mg p.o. twice daily      Lipoma of torso     She has what is previously been thought to be a lipoma in her left upper quadrant of abdomen under her bra line.  It has been present for many years but she has noted some firm areas that are mildly tender to palpation.  No resting pain, no redness ,no heat, no increase in size.  We will follow over time.  No clear reason for removal.  Return precautions provided.      Other Visit Diagnoses     Need for influenza vaccination       Relevant Orders   Flu Vaccine QUAD High Dose(Fluad) (Completed)        Eliezer Lofts, MD

## 2022-07-06 NOTE — Patient Instructions (Signed)
Decrease metformin to  500 mg once daily.

## 2022-07-06 NOTE — Assessment & Plan Note (Signed)
She has what is previously been thought to be a lipoma in her left upper quadrant of abdomen under her bra line.  It has been present for many years but she has noted some firm areas that are mildly tender to palpation.  No resting pain, no redness ,no heat, no increase in size.  We will follow over time.  No clear reason for removal.  Return precautions provided.

## 2022-07-09 NOTE — Addendum Note (Signed)
Addended by: Carter Kitten on: 07/09/2022 08:52 AM   Modules accepted: Orders

## 2022-08-12 ENCOUNTER — Other Ambulatory Visit: Payer: Self-pay

## 2022-08-12 MED ORDER — DICLOFENAC SODIUM 75 MG PO TBEC: 75.0000 mg | DELAYED_RELEASE_TABLET | Freq: Two times a day (BID) | ORAL | 0 refills | Status: DC

## 2022-08-17 DIAGNOSIS — H353132 Nonexudative age-related macular degeneration, bilateral, intermediate dry stage: Secondary | ICD-10-CM | POA: Diagnosis not present

## 2022-08-17 DIAGNOSIS — H401131 Primary open-angle glaucoma, bilateral, mild stage: Secondary | ICD-10-CM | POA: Diagnosis not present

## 2022-08-24 DIAGNOSIS — H401131 Primary open-angle glaucoma, bilateral, mild stage: Secondary | ICD-10-CM | POA: Diagnosis not present

## 2022-08-24 DIAGNOSIS — H353132 Nonexudative age-related macular degeneration, bilateral, intermediate dry stage: Secondary | ICD-10-CM | POA: Diagnosis not present

## 2022-08-24 DIAGNOSIS — Z0100 Encounter for examination of eyes and vision without abnormal findings: Secondary | ICD-10-CM | POA: Diagnosis not present

## 2022-08-24 LAB — HM DIABETES EYE EXAM

## 2022-08-27 ENCOUNTER — Other Ambulatory Visit: Payer: Self-pay | Admitting: Family Medicine

## 2022-08-27 NOTE — Telephone Encounter (Signed)
Last office visit 07/06/2022 for DM.  Last refilled ?07/06/22 for #120 with no refills.  CPE scheduled for 01/11/23.

## 2022-10-12 ENCOUNTER — Other Ambulatory Visit: Payer: Self-pay | Admitting: Family Medicine

## 2022-10-13 NOTE — Telephone Encounter (Signed)
Last office visit 07/06/22 for DM.  Last refilled 08/17/22 for #120 with no refills.  Next Appt: 01/11/2023 for CPE.

## 2022-12-01 ENCOUNTER — Telehealth: Payer: Self-pay | Admitting: Family Medicine

## 2022-12-01 NOTE — Telephone Encounter (Signed)
Contacted Sharlynn Oliphant Stallsmith to schedule their annual wellness visit. Appointment made for 01/12/2023.  [facility] Care Guide [name] TEAM Direct Dial: [phone]

## 2022-12-09 ENCOUNTER — Other Ambulatory Visit: Payer: Self-pay | Admitting: Family Medicine

## 2022-12-09 NOTE — Telephone Encounter (Signed)
Last written 10-13-22 #120 Last OV 07-06-22 Next OV 01-11-23 [facility]

## 2022-12-23 ENCOUNTER — Telehealth: Payer: Self-pay | Admitting: *Deleted

## 2022-12-23 DIAGNOSIS — E1169 Type 2 diabetes mellitus with other specified complication: Secondary | ICD-10-CM

## 2022-12-23 DIAGNOSIS — N1831 Chronic kidney disease, stage 3a: Secondary | ICD-10-CM

## 2022-12-23 NOTE — Telephone Encounter (Signed)
-----   Message from Alvina Chou sent at 12/22/2022  3:30 PM EDT ----- Regarding: Lab orders for Tuesday, 5.28.24 Patient is scheduled for CPX labs, please order future labs, Thanks , Camelia Eng

## 2022-12-28 ENCOUNTER — Other Ambulatory Visit: Payer: Self-pay | Admitting: Family Medicine

## 2023-01-04 ENCOUNTER — Other Ambulatory Visit: Payer: Medicare HMO

## 2023-01-11 ENCOUNTER — Encounter: Payer: Medicare HMO | Admitting: Family Medicine

## 2023-01-11 ENCOUNTER — Other Ambulatory Visit: Payer: Self-pay | Admitting: Family Medicine

## 2023-01-11 ENCOUNTER — Other Ambulatory Visit: Payer: Self-pay | Admitting: Gastroenterology

## 2023-01-11 DIAGNOSIS — E1122 Type 2 diabetes mellitus with diabetic chronic kidney disease: Secondary | ICD-10-CM

## 2023-02-09 DIAGNOSIS — S7002XA Contusion of left hip, initial encounter: Secondary | ICD-10-CM | POA: Diagnosis not present

## 2023-02-09 DIAGNOSIS — M25552 Pain in left hip: Secondary | ICD-10-CM | POA: Diagnosis not present

## 2023-02-09 DIAGNOSIS — M48061 Spinal stenosis, lumbar region without neurogenic claudication: Secondary | ICD-10-CM | POA: Diagnosis not present

## 2023-02-09 DIAGNOSIS — M5416 Radiculopathy, lumbar region: Secondary | ICD-10-CM | POA: Diagnosis not present

## 2023-02-09 DIAGNOSIS — S8002XA Contusion of left knee, initial encounter: Secondary | ICD-10-CM | POA: Diagnosis not present

## 2023-02-23 ENCOUNTER — Other Ambulatory Visit (INDEPENDENT_AMBULATORY_CARE_PROVIDER_SITE_OTHER): Payer: Medicare HMO

## 2023-02-23 DIAGNOSIS — E1122 Type 2 diabetes mellitus with diabetic chronic kidney disease: Secondary | ICD-10-CM

## 2023-02-23 DIAGNOSIS — M5416 Radiculopathy, lumbar region: Secondary | ICD-10-CM | POA: Diagnosis not present

## 2023-02-23 DIAGNOSIS — E785 Hyperlipidemia, unspecified: Secondary | ICD-10-CM

## 2023-02-23 DIAGNOSIS — N1831 Chronic kidney disease, stage 3a: Secondary | ICD-10-CM

## 2023-02-23 DIAGNOSIS — E1169 Type 2 diabetes mellitus with other specified complication: Secondary | ICD-10-CM

## 2023-02-23 DIAGNOSIS — M25552 Pain in left hip: Secondary | ICD-10-CM | POA: Diagnosis not present

## 2023-02-23 DIAGNOSIS — S7002XA Contusion of left hip, initial encounter: Secondary | ICD-10-CM | POA: Diagnosis not present

## 2023-02-23 DIAGNOSIS — S8002XA Contusion of left knee, initial encounter: Secondary | ICD-10-CM | POA: Diagnosis not present

## 2023-02-23 DIAGNOSIS — M48061 Spinal stenosis, lumbar region without neurogenic claudication: Secondary | ICD-10-CM | POA: Diagnosis not present

## 2023-02-23 LAB — COMPREHENSIVE METABOLIC PANEL
ALT: 13 U/L (ref 0–35)
AST: 19 U/L (ref 0–37)
Albumin: 4.5 g/dL (ref 3.5–5.2)
Alkaline Phosphatase: 50 U/L (ref 39–117)
BUN: 27 mg/dL — ABNORMAL HIGH (ref 6–23)
CO2: 27 mEq/L (ref 19–32)
Calcium: 10.8 mg/dL — ABNORMAL HIGH (ref 8.4–10.5)
Chloride: 102 mEq/L (ref 96–112)
Creatinine, Ser: 1.35 mg/dL — ABNORMAL HIGH (ref 0.40–1.20)
GFR: 38.08 mL/min — ABNORMAL LOW (ref >60.00)
Glucose, Bld: 99 mg/dL (ref 70–99)
Potassium: 4.9 mEq/L (ref 3.5–5.1)
Sodium: 138 mEq/L (ref 135–145)
Total Bilirubin: 0.3 mg/dL (ref 0.2–1.2)
Total Protein: 7.5 g/dL (ref 6.0–8.3)

## 2023-02-23 LAB — LIPID PANEL
Cholesterol: 252 mg/dL — ABNORMAL HIGH (ref 0–200)
HDL: 64.3 mg/dL (ref >39.00)
NonHDL: 187.85
Total CHOL/HDL Ratio: 4
Triglycerides: 248 mg/dL — ABNORMAL HIGH (ref 0.0–149.0)
VLDL: 49.6 mg/dL — ABNORMAL HIGH (ref 0.0–40.0)

## 2023-02-23 LAB — LDL CHOLESTEROL, DIRECT: Direct LDL: 157 mg/dL

## 2023-02-23 LAB — MICROALBUMIN / CREATININE URINE RATIO
Creatinine,U: 52.1 mg/dL
Microalb Creat Ratio: 1.3 mg/g (ref 0.0–30.0)
Microalb, Ur: <0.7 mg/dL (ref 0.0–1.9)

## 2023-02-23 LAB — HEMOGLOBIN A1C: Hgb A1c MFr Bld: 5.9 % (ref 4.6–6.5)

## 2023-02-24 ENCOUNTER — Ambulatory Visit (INDEPENDENT_AMBULATORY_CARE_PROVIDER_SITE_OTHER): Payer: Medicare HMO

## 2023-02-24 VITALS — Ht 60.0 in | Wt 162.0 lb

## 2023-02-24 DIAGNOSIS — Z0000 Encounter for general adult medical examination without abnormal findings: Secondary | ICD-10-CM | POA: Diagnosis not present

## 2023-02-24 DIAGNOSIS — Z1231 Encounter for screening mammogram for malignant neoplasm of breast: Secondary | ICD-10-CM

## 2023-02-24 DIAGNOSIS — M48061 Spinal stenosis, lumbar region without neurogenic claudication: Secondary | ICD-10-CM | POA: Diagnosis not present

## 2023-02-24 NOTE — Patient Instructions (Addendum)
Cathy Mullins , Thank you for taking time to come for your Medicare Wellness Visit. I appreciate your ongoing commitment to your health goals. Please review the following plan we discussed and let me know if I can assist you in the future.   These are the goals we discussed:  Goals      DIET - EAT MORE FRUITS AND VEGETABLES     Increase physical activity     Starting 02/04/2017, I will continue to do stretching exercises for at least 10 min daily.      Patient Stated     Starting 02/14/2018, I will continue to take medications as prescribed.      Patient Stated     12/18/2019, I will maintain and continue medications prescribed.      Patient Stated     Not be in pain.        This is a list of the screening recommended for you and due dates:  Health Maintenance  Topic Date Due   Zoster (Shingles) Vaccine (1 of 2) Never done   DTaP/Tdap/Td vaccine (2 - Tdap) 03/09/2014   DEXA scan (bone density measurement)  12/14/2020   COVID-19 Vaccine (5 - 2023-24 season) 04/09/2022   Complete foot exam   09/14/2022   Mammogram  10/14/2022   Medicare Annual Wellness Visit  01/01/2023   Flu Shot  03/10/2023   Eye exam for diabetics  08/25/2023   Hemoglobin A1C  08/26/2023   Yearly kidney function blood test for diabetes  02/23/2024   Yearly kidney health urinalysis for diabetes  02/23/2024   Pneumonia Vaccine  Completed   Hepatitis C Screening  Completed   HPV Vaccine  Aged Out   Colon Cancer Screening  Discontinued   Managing Pain Without Opioids Opioids are strong medicines used to treat moderate to severe pain. For some people, especially those who have long-term (chronic) pain, opioids may not be the best choice for pain management due to: Side effects like nausea, constipation, and sleepiness. The risk of addiction (opioid use disorder). The longer you take opioids, the greater your risk of addiction. Pain that lasts for more than 3 months is called chronic pain. Managing chronic pain  usually requires more than one approach and is often provided by a team of health care providers working together (multidisciplinary approach). Pain management may be done at a pain management center or pain clinic. How to manage pain without the use of opioids Use non-opioid medicines Non-opioid medicines for pain may include: Over-the-counter or prescription non-steroidal anti-inflammatory drugs (NSAIDs). These may be the first medicines used for pain. They work well for muscle and bone pain, and they reduce swelling. Acetaminophen. This over-the-counter medicine may work well for milder pain but not swelling. Antidepressants. These may be used to treat chronic pain. A certain type of antidepressant (tricyclics) is often used. These medicines are given in lower doses for pain than when used for depression. Anticonvulsants. These are usually used to treat seizures but may also reduce nerve (neuropathic) pain. Muscle relaxants. These relieve pain caused by sudden muscle tightening (spasms). You may also use a pain medicine that is applied to the skin as a patch, cream, or gel (topical analgesic), such as a numbing medicine. These may cause fewer side effects than medicines taken by mouth. Do certain therapies as directed Some therapies can help with pain management. They include: Physical therapy. You will do exercises to gain strength and flexibility. A physical therapist may teach you exercises to move  and stretch parts of your body that are weak, stiff, or painful. You can learn these exercises at physical therapy visits and practice them at home. Physical therapy may also involve: Massage. Heat wraps or applying heat or cold to affected areas. Electrical signals that interrupt pain signals (transcutaneous electrical nerve stimulation, TENS). Weak lasers that reduce pain and swelling (low-level laser therapy). Signals from your body that help you learn to regulate pain  (biofeedback). Occupational therapy. This helps you to learn ways to function at home and work with less pain. Recreational therapy. This involves trying new activities or hobbies, such as a physical activity or drawing. Mental health therapy, including: Cognitive behavioral therapy (CBT). This helps you learn coping skills for dealing with pain. Acceptance and commitment therapy (ACT) to change the way you think and react to pain. Relaxation therapies, including muscle relaxation exercises and mindfulness-based stress reduction. Pain management counseling. This may be individual, family, or group counseling.  Receive medical treatments Medical treatments for pain management include: Nerve block injections. These may include a pain blocker and anti-inflammatory medicines. You may have injections: Near the spine to relieve chronic back or neck pain. Into joints to relieve back or joint pain. Into nerve areas that supply a painful area to relieve body pain. Into muscles (trigger point injections) to relieve some painful muscle conditions. A medical device placed near your spine to help block pain signals and relieve nerve pain or chronic back pain (spinal cord stimulation device). Acupuncture. Follow these instructions at home Medicines Take over-the-counter and prescription medicines only as told by your health care provider. If you are taking pain medicine, ask your health care providers about possible side effects to watch out for. Do not drive or use heavy machinery while taking prescription opioid pain medicine. Lifestyle  Do not use drugs or alcohol to reduce pain. If you drink alcohol, limit how much you have to: 0-1 drink a day for women who are not pregnant. 0-2 drinks a day for men. Know how much alcohol is in a drink. In the [place] one drink equals one 12 oz bottle of beer (355 mL), one 5 oz glass of wine (148 mL), or one 1 oz glass of hard liquor (44 mL). Do not use any  products that contain nicotine or tobacco. These products include cigarettes, chewing tobacco, and vaping devices, such as e-cigarettes. If you need help quitting, ask your health care provider. Eat a healthy diet and maintain a healthy weight. Poor diet and excess weight may make pain worse. Eat foods that are high in fiber. These include fresh fruits and vegetables, whole grains, and beans. Limit foods that are high in fat and processed sugars, such as fried and sweet foods. Exercise regularly. Exercise lowers stress and may help relieve pain. Ask your health care provider what activities and exercises are safe for you. If your health care provider approves, join an exercise class that combines movement and stress reduction. Examples include yoga and tai chi. Get enough sleep. Lack of sleep may make pain worse. Lower stress as much as possible. Practice stress reduction techniques as told by your therapist. General instructions Work with all your pain management providers to find the treatments that work best for you. You are an important member of your pain management team. There are many things you can do to reduce pain on your own. Consider joining an online or in-person support group for people who have chronic pain. Keep all follow-up visits. This is important. Where  to find more information You can find more information about managing pain without opioids from: American Academy of Pain Medicine: painmed.org Institute for Chronic Pain: instituteforchronicpain.org American Chronic Pain Association: theacpa.org Contact a health care provider if: You have side effects from pain medicine. Your pain gets worse or does not get better with treatments or home therapy. You are struggling with anxiety or depression. Summary Many types of pain can be managed without opioids. Chronic pain may respond better to pain management without opioids. Pain is best managed when you and a team of health care  providers work together. Pain management without opioids may include non-opioid medicines, medical treatments, physical therapy, mental health therapy, and lifestyle changes. Tell your health care providers if your pain gets worse or is not being managed well enough. This information is not intended to replace advice given to you by your health care provider. Make sure you discuss any questions you have with your health care provider. Document Revised: 11/05/2020 Document Reviewed: 11/05/2020 Elsevier Patient Education  2024 [employer]   Advanced directives: Advance directive discussed with you today. Even though you declined this today, please call our office should you change your mind, and we can give you the proper paperwork for you to fill out.   Conditions/risks identified: Aim for 30 minutes of exercise or brisk walking, 6-8 glasses of water, and 5 servings of fruits and vegetables each day.   Next appointment: Follow up in one year for your annual wellness visit 02/29/24 @ 1pm televisit   Preventive Care 65 Years and Older, Female Preventive care refers to lifestyle choices and visits with your health care provider that can promote health and wellness. What does preventive care include? A yearly physical exam. This is also called an annual well check. Dental exams once or twice a year. Routine eye exams. Ask your health care provider how often you should have your eyes checked. Personal lifestyle choices, including: Daily care of your teeth and gums. Regular physical activity. Eating a healthy diet. Avoiding tobacco and drug use. Limiting alcohol use. Practicing safe sex. Taking low-dose aspirin every day. Taking vitamin and mineral supplements as recommended by your health care provider. What happens during an annual well check? The services and screenings done by your health care provider during your annual well check will depend on your age, overall health, lifestyle  risk factors, and family history of disease. Counseling  Your health care provider may ask you questions about your: Alcohol use. Tobacco use. Drug use. Emotional well-being. Home and relationship well-being. Sexual activity. Eating habits. History of falls. Memory and ability to understand (cognition). Work and work Astronomer. Reproductive health. Screening  You may have the following tests or measurements: Height, weight, and BMI. Blood pressure. Lipid and cholesterol levels. These may be checked every 5 years, or more frequently if you are over 25 years old. Skin check. Lung cancer screening. You may have this screening every year starting at age 60 if you have a 30-pack-year history of smoking and currently smoke or have quit within the past 15 years. Fecal occult blood test (FOBT) of the stool. You may have this test every year starting at age 42. Flexible sigmoidoscopy or colonoscopy. You may have a sigmoidoscopy every 5 years or a colonoscopy every 10 years starting at age 5. Hepatitis C blood test. Hepatitis B blood test. Sexually transmitted disease (STD) testing. Diabetes screening. This is done by checking your blood sugar (glucose) after you have not eaten for a while (  fasting). You may have this done every 1-3 years. Bone density scan. This is done to screen for osteoporosis. You may have this done starting at age 11. Mammogram. This may be done every 1-2 years. Talk to your health care provider about how often you should have regular mammograms. Talk with your health care provider about your test results, treatment options, and if necessary, the need for more tests. Vaccines  Your health care provider may recommend certain vaccines, such as: Influenza vaccine. This is recommended every year. Tetanus, diphtheria, and acellular pertussis (Tdap, Td) vaccine. You may need a Td booster every 10 years. Zoster vaccine. You may need this after age 73. Pneumococcal  13-valent conjugate (PCV13) vaccine. One dose is recommended after age 42. Pneumococcal polysaccharide (PPSV23) vaccine. One dose is recommended after age 48. Talk to your health care provider about which screenings and vaccines you need and how often you need them. This information is not intended to replace advice given to you by your health care provider. Make sure you discuss any questions you have with your health care provider. Document Released: 08/22/2015 Document Revised: 04/14/2016 Document Reviewed: 05/27/2015 Elsevier Interactive Patient Education  2017 [employer].  Fall Prevention in the Home Falls can cause injuries. They can happen to people of all ages. There are many things you can do to make your home safe and to help prevent falls. What can I do on the outside of my home? Regularly fix the edges of walkways and driveways and fix any cracks. Remove anything that might make you trip as you walk through a door, such as a raised step or threshold. Trim any bushes or trees on the path to your home. Use bright outdoor lighting. Clear any walking paths of anything that might make someone trip, such as rocks or tools. Regularly check to see if handrails are loose or broken. Make sure that both sides of any steps have handrails. Any raised decks and porches should have guardrails on the edges. Have any leaves, snow, or ice cleared regularly. Use sand or salt on walking paths during winter. Clean up any spills in your garage right away. This includes oil or grease spills. What can I do in the bathroom? Use night lights. Install grab bars by the toilet and in the tub and shower. Do not use towel bars as grab bars. Use non-skid mats or decals in the tub or shower. If you need to sit down in the shower, use a plastic, non-slip stool. Keep the floor dry. Clean up any water that spills on the floor as soon as it happens. Remove soap buildup in the tub or shower regularly. Attach  bath mats securely with double-sided non-slip rug tape. Do not have throw rugs and other things on the floor that can make you trip. What can I do in the bedroom? Use night lights. Make sure that you have a light by your bed that is easy to reach. Do not use any sheets or blankets that are too big for your bed. They should not hang down onto the floor. Have a firm chair that has side arms. You can use this for support while you get dressed. Do not have throw rugs and other things on the floor that can make you trip. What can I do in the kitchen? Clean up any spills right away. Avoid walking on wet floors. Keep items that you use a lot in easy-to-reach places. If you need to reach something above you, use  a strong step stool that has a grab bar. Keep electrical cords out of the way. Do not use floor polish or wax that makes floors slippery. If you must use wax, use non-skid floor wax. Do not have throw rugs and other things on the floor that can make you trip. What can I do with my stairs? Do not leave any items on the stairs. Make sure that there are handrails on both sides of the stairs and use them. Fix handrails that are broken or loose. Make sure that handrails are as long as the stairways. Check any carpeting to make sure that it is firmly attached to the stairs. Fix any carpet that is loose or worn. Avoid having throw rugs at the top or bottom of the stairs. If you do have throw rugs, attach them to the floor with carpet tape. Make sure that you have a light switch at the top of the stairs and the bottom of the stairs. If you do not have them, ask someone to add them for you. What else can I do to help prevent falls? Wear shoes that: Do not have high heels. Have rubber bottoms. Are comfortable and fit you well. Are closed at the toe. Do not wear sandals. If you use a stepladder: Make sure that it is fully opened. Do not climb a closed stepladder. Make sure that both sides of the  stepladder are locked into place. Ask someone to hold it for you, if possible. Clearly mark and make sure that you can see: Any grab bars or handrails. First and last steps. Where the edge of each step is. Use tools that help you move around (mobility aids) if they are needed. These include: Canes. Walkers. Scooters. Crutches. Turn on the lights when you go into a dark area. Replace any light bulbs as soon as they burn out. Set up your furniture so you have a clear path. Avoid moving your furniture around. If any of your floors are uneven, fix them. If there are any pets around you, be aware of where they are. Review your medicines with your doctor. Some medicines can make you feel dizzy. This can increase your chance of falling. Ask your doctor what other things that you can do to help prevent falls. This information is not intended to replace advice given to you by your health care provider. Make sure you discuss any questions you have with your health care provider. Document Released: 05/22/2009 Document Revised: 01/01/2016 Document Reviewed: 08/30/2014 Elsevier Interactive Patient Education  2017 [employer].

## 2023-02-24 NOTE — Progress Notes (Signed)
Subjective:   Cathy Mullins is a 77 y.o. female who presents for Medicare Annual (Subsequent) preventive examination.  Visit Complete: Virtual  I connected with  Cathy Mullins on 02/24/23 by a audio enabled telemedicine application and verified that I am speaking with the correct person using two identifiers.  Patient Location: Home  Provider Location: Office/Clinic  I discussed the limitations of evaluation and management by telemedicine. The patient expressed understanding and agreed to proceed.    Review of Systems      Cardiac Risk Factors include: advanced age (>38men, >20 women);hypertension;dyslipidemia;diabetes mellitus;sedentary lifestyle  Per patient no change in vitals since last visit, unable to obtain new vitals due to telehealth visit     Objective:    Today's Vitals   02/24/23 1439  Weight: 162 lb (73.5 kg)  Height: 5' (1.524 m)  PainSc: 8    Body mass index is 31.64 kg/m.     02/24/2023    2:52 PM 12/31/2021    3:35 PM 06/29/2021    7:00 AM 12/18/2019    3:33 PM 02/14/2018   10:40 AM 02/04/2017   12:25 PM 06/04/2016    3:05 PM  Advanced Directives  Does Patient Have a Medical Advance Directive? No No Yes Yes Yes Yes Yes  Type of Chief of Staff of [place];Living will Healthcare Power of [place];Living will Healthcare Power of [place];Living will Healthcare Power of Attorney  Does patient want to make changes to medical advance directive?   No - Patient declined      Copy of Healthcare Power of Attorney in Chart?   No - copy requested No - copy requested No - copy requested No - copy requested   Would patient like information on creating a medical advance directive? No - Patient declined No - Patient declined         Current Medications (verified) Outpatient Encounter Medications as of 02/24/2023  Medication Sig   Accu-Chek Softclix Lancets lancets TEST BLOOD SUGAR TWICE DAILY   Alcohol Swabs  (DROPSAFE ALCOHOL PREP) 70 % PADS USE TWICE DAILY   Blood Glucose Calibration (ACCU-CHEK AVIVA) SOLN Use to check controls on glucose meter   Blood Glucose Monitoring Suppl (ACCU-CHEK AVIVA PLUS) w/Device KIT Use to check blood sugar two times a day   diclofenac (VOLTAREN) 75 MG EC tablet TAKE 1 TABLET TWICE DAILY   diclofenac sodium (VOLTAREN) 1 % GEL APPLY 2 GRAMS TOPICALLY FOUR TIMES A DAY AS NEEDED   glucose blood (ACCU-CHEK AVIVA PLUS) test strip TEST BLOOD SUGAR TWO TIMES DAILY   latanoprost (XALATAN) 0.005 % ophthalmic solution Place 1 drop into both eyes at bedtime.   lisinopril (ZESTRIL) 10 MG tablet TAKE 1 TABLET EVERY DAY   metFORMIN (GLUCOPHAGE-XR) 500 MG 24 hr tablet TAKE 2 TABLETS EVERY MORNING AND TAKE 1 TABLET AT NIGHT   metoprolol tartrate (LOPRESSOR) 50 MG tablet TAKE 1 TABLET TWICE DAILY   Multiple Vitamins-Minerals (CENTRUM ADULTS PO) Take 1 tablet by mouth daily.   Pancrelipase, Lip-Prot-Amyl, (ZENPEP) 40000-126000 units CPEP Take 2 capsules with each meal and 1 capsule with a snack   timolol (TIMOPTIC) 0.5 % ophthalmic solution Place 1 drop into both eyes daily.    traMADol (ULTRAM) 50 MG tablet TAKE 2 TABLETS TWICE A DAY   desipramine (NORPRAMIN) 25 MG tablet TAKE 1 TABLET AT BEDTIME (Patient not taking: Reported on 02/24/2023)   hydrocortisone (ANUSOL-HC) 2.5 % rectal cream Place 1 Application rectally 2 (two) times  daily. (Patient not taking: Reported on 02/24/2023)   neomycin-bacitracin-polymyxin (NEOSPORIN) ointment Apply 1 application topically as needed for wound care.  (Patient not taking: Reported on 02/24/2023)   Facility-Administered Encounter Medications as of 02/24/2023  Medication   betamethasone acetate-betamethasone sodium phosphate (CELESTONE) injection 3 mg    Allergies (verified) Amoxicillin-pot clavulanate, Codeine, Hydrocodone-acetaminophen, Lovastatin, Sulfonamide derivatives, Tizanidine, and Tape   History: Past Medical History:  Diagnosis Date    Allergy    Asthma with COPD    Complication of anesthesia    Difficulty waking after Spinal injection (2006)   Depression    daughter was very sick, depression began during this period of time    Diabetes mellitus    Esophagitis    Fibromyalgia    GERD (gastroesophageal reflux disease)    Glaucoma    History of colon polyps    HTN (hypertension)    Hyperlipidemia    Lichen sclerosus    Migraine    Perforating folliculitis    Spinal stenosis    Tachycardia    TIA (transient ischemic attack)    Past Surgical History:  Procedure Laterality Date   ABDOMINAL HYSTERECTOMY     right ovary remaining   APPENDECTOMY     BREAST CYST ASPIRATION Bilateral    BREAST EXCISIONAL BIOPSY Right 1982   NEG   BREAST SURGERY     right(benign)   CARPAL TUNNEL RELEASE     CESAREAN SECTION     COLONOSCOPY WITH PROPOFOL N/A 06/29/2021   Procedure: COLONOSCOPY WITH PROPOFOL;  Surgeon: Midge Minium, MD;  Location: [facility];  Service: Endoscopy;  Laterality: N/A;  Diabetic   EPIDURAL BLOCK INJECTION  2013   ESOPHAGOGASTRODUODENOSCOPY (EGD) WITH PROPOFOL N/A 06/29/2021   Procedure: ESOPHAGOGASTRODUODENOSCOPY (EGD) WITH PROPOFOL;  Surgeon: Midge Minium, MD;  Location: [facility];  Service: Endoscopy;  Laterality: N/A;   KNEE ARTHROSCOPY W/ MENISCAL REPAIR     left   LIPOMA EXCISION     2 times left leg   lumpectomy     TONSILLECTOMY     TRIGGER FINGER RELEASE     Family History  Problem Relation Age of Onset   Cancer Mother        breast   Stroke Mother    Valvular heart disease Mother    Breast cancer Mother 51   Alzheimer's disease Mother    Kidney disease Father    Stroke Father    Coronary artery disease Father    Parkinson's disease Father    Diabetes Brother    Alzheimer's disease Maternal Grandmother    Diabetes Maternal Aunt    Alzheimer's disease Maternal Aunt    Alzheimer's disease Maternal Aunt    Colon cancer Neg Hx    Pancreatic cancer Neg Hx     Stomach cancer Neg Hx    Social History   Socioeconomic History   Marital status: Married    Spouse name: Not on file   Number of children: 1   Years of education: Not on file   Highest education level: Some college, no degree  Occupational History   Occupation: retired Immunologist estate)    Associate Professor: retired  Tobacco Use   Smoking status: Former    Current packs/day: 0.00    Types: Cigarettes    Quit date: 09/27/1988    Years since quitting: 34.4   Smokeless tobacco: Never  Vaping Use   Vaping status: Never Used  Substance and Sexual Activity   Alcohol use: No  Alcohol/week: 0.0 standard drinks of alcohol   Drug use: No   Sexual activity: Not on file  Other Topics Concern   Not on file  Social History Narrative   Regular exercise--no   Diet: fruits and veggies, calcium, water, avoids fast food   Right handed   Caffeine use: 1 quart to 1/2 gallon of tea daily                  Social Determinants of Health   Financial Resource Strain: Low Risk  (02/24/2023)   Overall Financial Resource Strain (CARDIA)    Difficulty of Paying Living Expenses: Not hard at all  Food Insecurity: No Food Insecurity (02/24/2023)   Hunger Vital Sign    Worried About Running Out of Food in the Last Year: Never true    Ran Out of Food in the Last Year: Never true  Transportation Needs: No Transportation Needs (02/24/2023)   PRAPARE - Administrator, Civil Service (Medical): No    Lack of Transportation (Non-Medical): No  Physical Activity: Inactive (02/24/2023)   Exercise Vital Sign    Days of Exercise per Week: 0 days    Minutes of Exercise per Session: 0 min  Stress: No Stress Concern Present (02/24/2023)   [employer] of Occupational Health - Occupational Stress Questionnaire    Feeling of Stress : Not at all  Social Connections: Moderately Isolated (02/24/2023)   Social Connection and Isolation Panel [NHANES]    Frequency of Communication with Friends and Family: More  than three times a week    Frequency of Social Gatherings with Friends and Family: Never    Attends Religious Services: Never    Database administrator or Organizations: No    Attends Engineer, structural: Never    Marital Status: Married    Tobacco Counseling Counseling given: Not Answered   Clinical Intake:  Pre-visit preparation completed: Yes  Pain : 0-10 Pain Score: 8  Pain Type: Acute pain Pain Location: Back Pain Orientation: Lower Pain Descriptors / Indicators: Spasm, Aching, Sharp Pain Onset: 1 to 4 weeks ago     BMI - recorded: 31.64 Nutritional Risks: None Diabetes: Yes CBG done?: No CBG resulted in Enter/ Edit results?: No Did pt. bring in CBG monitor from home?: No  How often do you need to have someone help you when you read instructions, pamphlets, or other written materials from your doctor or pharmacy?: 1 - Never  Interpreter Needed?: No  Information entered by :: C.Trellis Guirguis LPN   Activities of Daily Living    02/24/2023    2:54 PM  In your present state of health, do you have any difficulty performing the following activities:  Hearing? 0  Vision? 1  Comment macular degeneration , glaucoma  Difficulty concentrating or making decisions? 1  Comment occasionally forgets  Walking or climbing stairs? 1  Comment back injury  Dressing or bathing? 0  Doing errands, shopping? 0  Preparing Food and eating ? N  Using the Toilet? N  In the past six months, have you accidently leaked urine? N  Do you have problems with loss of bowel control? N  Managing your Medications? N  Managing your Finances? N  Housekeeping or managing your Housekeeping? N    Patient Care Team: Excell Seltzer, MD as PCP - General Vilinda Flake, [facility] (Inactive) as Pharmacist (Pharmacist)  Indicate any recent Medical Services you may have received from other than [facility] providers in the past year (  date may be approximate).     Assessment:   This is a routine  wellness examination for [place].  Hearing/Vision screen Hearing Screening - Comments:: Denies hearing difficulties   Vision Screening - Comments:: Glasses - [facility] - UTD on eye exams - has glaucoma, and macular degeneration.  Dietary issues and exercise activities discussed:     Goals Addressed             This Visit's Progress    Patient Stated       Not be in pain.       Depression Screen    02/24/2023    2:51 PM 01/05/2022    3:02 PM 12/31/2021    3:32 PM 10/06/2021    2:25 PM 12/26/2020    3:28 PM 12/18/2019    3:37 PM 02/16/2018   11:09 AM  PHQ 2/9 Scores  PHQ - 2 Score 0 0 0 0 0 0 0  PHQ- 9 Score  5   4 0     Fall Risk    02/24/2023    2:54 PM 01/05/2022    3:02 PM 12/31/2021    3:36 PM 10/06/2021    2:25 PM 12/26/2020    2:54 PM  Fall Risk   Falls in the past year? 1 1 0 0 1  Number falls in past yr: 1 0 0 0 1  Comment Fell on area rug      Injury with Fall? 1 1 0 0 1  Risk for fall due to : Impaired balance/gait;Orthopedic patient Impaired balance/gait History of fall(s) No Fall Risks   Follow up Education provided;Falls prevention discussed;Falls evaluation completed Falls evaluation completed Falls evaluation completed;Falls prevention discussed Falls evaluation completed     MEDICARE RISK AT HOME:  Medicare Risk at Home - 02/24/23 1456     Any stairs in or around the home? Yes    If so, are there any without handrails? No    Home free of loose throw rugs in walkways, pet beds, electrical cords, etc? Yes    Adequate lighting in your home to reduce risk of falls? Yes    Life alert? No    Use of a cane, walker or w/c? No    Grab bars in the bathroom? Yes    Shower chair or bench in shower? No    Elevated toilet seat or a handicapped toilet? Yes             TIMED UP AND GO:  Was the test performed?  No    Cognitive Function:    12/18/2019    3:40 PM 02/14/2018   10:35 AM 02/04/2017   12:26 PM  MMSE - Mini Mental State Exam  Orientation  to time 5 5 5   Orientation to Place 5 5 5   Registration 3 3 3   Attention/ Calculation 5 0 0  Recall 3 3 3   Language- name 2 objects  0 0  Language- repeat 1 1 1   Language- follow 3 step command  3 3  Language- read & follow direction  0 0  Write a sentence  0 0  Copy design  0 0  Total score  20 20        02/24/2023    2:57 PM 12/31/2021    3:40 PM  6CIT Screen  What Year? 0 points 0 points  What month? 0 points 0 points  What time? 0 points 0 points  Count back from 20 0 points 0 points  Months in reverse 0 points 0 points  Repeat phrase 0 points 0 points  Total Score 0 points 0 points    Immunizations Immunization History  Administered Date(s) Administered   Fluad Quad(high Dose 65+) 07/26/2019, 06/24/2020, 07/06/2022   Influenza Split 06/13/2012   Influenza Whole 08/20/2003, 05/30/2007, 05/30/2008, 06/06/2009, 06/17/2010   Influenza, High Dose Seasonal PF 06/23/2021   Influenza,inj,Quad PF,6+ Mos 06/29/2013, 07/09/2014, 04/25/2015, 05/20/2016, 05/13/2017, 06/29/2018   Influenza-Unspecified 06/29/2013, 07/09/2014, 04/25/2015, 05/20/2016, 05/13/2017   PFIZER(Purple Top)SARS-COV-2 Vaccination 10/04/2019, 10/25/2019, 06/09/2020   Pfizer Covid-19 Vaccine Bivalent Booster 50yrs & up 06/23/2021   Pneumococcal Conjugate-13 07/09/2014   Pneumococcal Polysaccharide-23 06/04/2011   Td 03/09/2004    TDAP status: Due, Education has been provided regarding the importance of this vaccine. Advised may receive this vaccine at local pharmacy or Health Dept. Aware to provide a copy of the vaccination record if obtained from local pharmacy or Health Dept. Verbalized acceptance and understanding.  Flu Vaccine status: Up to date  Pneumococcal vaccine status: Up to date  Covid-19 vaccine status: Information provided on how to obtain vaccines.   Qualifies for Shingles Vaccine? Yes   Zostavax completed No   Shingrix Completed?: No.    Education has been provided regarding the importance  of this vaccine. Patient has been advised to call insurance company to determine out of pocket expense if they have not yet received this vaccine. Advised may also receive vaccine at local pharmacy or Health Dept. Verbalized acceptance and understanding.  Screening Tests Health Maintenance  Topic Date Due   Zoster Vaccines- Shingrix (1 of 2) Never done   DTaP/Tdap/Td (2 - Tdap) 03/09/2014   DEXA SCAN  12/14/2020   COVID-19 Vaccine (5 - 2023-24 season) 04/09/2022   FOOT EXAM  09/14/2022   MAMMOGRAM  10/14/2022   Medicare Annual Wellness ([name])  01/01/2023   INFLUENZA VACCINE  03/10/2023   OPHTHALMOLOGY EXAM  08/25/2023   HEMOGLOBIN A1C  08/26/2023   Diabetic kidney evaluation - eGFR measurement  02/23/2024   Diabetic kidney evaluation - Urine ACR  02/23/2024   Pneumonia Vaccine 62+ Years old  Completed   Hepatitis C Screening  Completed   HPV VACCINES  Aged Out   Colonoscopy  Discontinued    Health Maintenance  Health Maintenance Due  Topic Date Due   Zoster Vaccines- Shingrix (1 of 2) Never done   DTaP/Tdap/Td (2 - Tdap) 03/09/2014   DEXA SCAN  12/14/2020   COVID-19 Vaccine (5 - 2023-24 season) 04/09/2022   FOOT EXAM  09/14/2022   MAMMOGRAM  10/14/2022   Medicare Annual Wellness ([name])  01/01/2023    Colorectal cancer screening: No longer required.   Mammogram status: Ordered 02/24/23. Pt provided with contact info and advised to call to schedule appt.   Bone Density screening declined.  Lung Cancer Screening: (Low Dose CT Chest recommended if Age 67-80 years, 20 pack-year currently smoking OR have quit w/in 15years.) does not qualify.   Lung Cancer Screening Referral: no  Additional Screening:  Hepatitis C Screening: does qualify; Completed 10/23/15  Vision Screening: Recommended annual ophthalmology exams for early detection of glaucoma and other disorders of the eye. Is the patient up to date with their annual eye exam?  Yes  Who is the provider or what is the  name of the office in which the patient attends annual eye exams? [facility] If pt is not established with a provider, would they like to be referred to a provider to establish care? Yes .  Dental Screening: Recommended annual dental exams for proper oral hygiene  Diabetic Foot Exam: Diabetic Foot Exam: Completed 09/14/21  Community Resource Referral / Chronic Care Management: CRR required this visit?  No   CCM required this visit?  No     Plan:     I have personally reviewed and noted the following in the patient's chart:   Medical and social history Use of alcohol, tobacco or illicit drugs  Current medications and supplements including opioid prescriptions. Patient is currently taking opioid prescriptions. Information provided to patient regarding non-opioid alternatives. Patient advised to discuss non-opioid treatment plan with their provider. Functional ability and status Nutritional status Physical activity Advanced directives List of other physicians Hospitalizations, surgeries, and ER visits in previous 12 months Vitals Screenings to include cognitive, depression, and falls Referrals and appointments  In addition, I have reviewed and discussed with patient certain preventive protocols, quality metrics, and best practice recommendations. A written personalized care plan for preventive services as well as general preventive health recommendations were provided to patient.     Maryan Puls, LPN   1/61/0960   After Visit Summary: (MyChart) Due to this being a telephonic visit, the after visit summary with patients personalized plan was offered to patient via MyChart   Nurse Notes: none

## 2023-02-27 NOTE — Progress Notes (Signed)
No critical labs need to be addressed urgently. We will discuss labs in detail at upcoming office visit.   

## 2023-03-02 ENCOUNTER — Ambulatory Visit (INDEPENDENT_AMBULATORY_CARE_PROVIDER_SITE_OTHER): Payer: Medicare HMO | Admitting: Family Medicine

## 2023-03-02 ENCOUNTER — Encounter: Payer: Self-pay | Admitting: Family Medicine

## 2023-03-02 VITALS — BP 102/62 | HR 59 | Temp 97.3°F | Ht 60.0 in | Wt 163.0 lb

## 2023-03-02 DIAGNOSIS — E1122 Type 2 diabetes mellitus with diabetic chronic kidney disease: Secondary | ICD-10-CM

## 2023-03-02 DIAGNOSIS — N1831 Chronic kidney disease, stage 3a: Secondary | ICD-10-CM | POA: Diagnosis not present

## 2023-03-02 DIAGNOSIS — E1159 Type 2 diabetes mellitus with other circulatory complications: Secondary | ICD-10-CM

## 2023-03-02 DIAGNOSIS — T466X5A Adverse effect of antihyperlipidemic and antiarteriosclerotic drugs, initial encounter: Secondary | ICD-10-CM | POA: Diagnosis not present

## 2023-03-02 DIAGNOSIS — J449 Chronic obstructive pulmonary disease, unspecified: Secondary | ICD-10-CM

## 2023-03-02 DIAGNOSIS — E1169 Type 2 diabetes mellitus with other specified complication: Secondary | ICD-10-CM

## 2023-03-02 DIAGNOSIS — Z0000 Encounter for general adult medical examination without abnormal findings: Secondary | ICD-10-CM

## 2023-03-02 DIAGNOSIS — E785 Hyperlipidemia, unspecified: Secondary | ICD-10-CM

## 2023-03-02 DIAGNOSIS — N183 Chronic kidney disease, stage 3 unspecified: Secondary | ICD-10-CM

## 2023-03-02 DIAGNOSIS — G720 Drug-induced myopathy: Secondary | ICD-10-CM | POA: Diagnosis not present

## 2023-03-02 DIAGNOSIS — I152 Hypertension secondary to endocrine disorders: Secondary | ICD-10-CM

## 2023-03-02 LAB — HM DIABETES FOOT EXAM

## 2023-03-02 NOTE — Assessment & Plan Note (Signed)
New issue... she has been drinking more milk lately. Also on 3 calcium supplements.

## 2023-03-02 NOTE — Assessment & Plan Note (Addendum)
Chronic well controlled on metformin 500 mg XR  twice daily.. she decided against lowering dose as feeling well and no lows.   Associated with renal complications.

## 2023-03-02 NOTE — Progress Notes (Signed)
Patient ID: Cathy Mullins, female    DOB: 05/17/1946, 77 y.o.   MRN: [id number]  This visit was conducted in person.  BP 102/62 (BP Location: Left Arm, Patient Position: Sitting, Cuff Size: Normal)   Pulse (!) 59   Temp (!) 97.3 F (36.3 C) (Temporal)   Ht 5' (1.524 m)   Wt 163 lb (73.9 kg)   SpO2 99%   BMI 31.83 kg/m    CC:  annual, number 2    Subjective:   HPI: Cathy Mullins is a 77 y.o. female presenting on 03/02/2023 for he patient presents for complete physical and review of chronic health problems. He/She also has the following acute concerns today:  She fell 1 month accidental tripped on rug... hads been seeing [facility].. low back pain and hip pain, left knee. Had MRI off lumbar spine.  Has upcoming OV scheduled with neurosurgery.  Using tylenol and tramadol for pain.    She also is delaing with a lot of stress with her daughters illness. Daughter is facing upcoming heart surgery.  The patient saw a LPN or RN for medicare wellness visit. 02/24/2023  Prevention and wellness was reviewed in detail. Note reviewed and important notes copied below.  Diabetes: Chronic well-controlled on metformin XR 500 mg twice daily Lab Results  Component Value Date   HGBA1C 5.9 02/23/2023  Associated with chronic kidney disease. Using medications without difficulties: Hypoglycemic episodes: none Hyperglycemic episodes: none Feet problems: none Blood Sugars averaging: fbs 85-100 eye exam within last year: yes  Hypertension:  Excellent control on Lisinopril 10 mg p.o. daily Metoprolol tartrate 50 mg p.o. twice daily BP Readings from Last 3 Encounters:  03/02/23 102/62  07/06/22 (!) 140/70  01/05/22 120/80  Using medication without problems or lightheadedness:  none Chest pain with exertion: none Edema: none [place] of breath:  intermittent Average home BPs: 130/70 Other issues:  Elevated Cholesterol:  Improved control  but not at goal on  no medication.. refused statins  give SE in past. Does not want to take zetia or welchol. Lab Results  Component Value Date   CHOL 252 (H) 02/23/2023   HDL 64.30 02/23/2023   LDLCALC 149 (H) 12/29/2021   LDLDIRECT 157.0 02/23/2023   TRIG 248.0 (H) 02/23/2023   CHOLHDL 4 02/23/2023  Using medications without problems: Muscle aches:  Diet compliance: more salads. Exercise: limited given chronic back pain/fibromyalgia.Marland Kitchen using tramadol 50 mg Fourtimes daily. Helps her sleep  Other complaints:  COPD: Mild, stable control using albuterol as needed      Relevant past medical, surgical, family and social history reviewed and updated as indicated. Interim medical history since our last visit reviewed. Allergies and medications reviewed and updated. Outpatient Medications Prior to Visit  Medication Sig Dispense Refill   Accu-Chek Softclix Lancets lancets TEST BLOOD SUGAR TWICE DAILY 200 each 3   Alcohol Swabs (DROPSAFE ALCOHOL PREP) 70 % PADS USE TWICE DAILY 200 each 3   B Complex Vitamins (VITAMIN B COMPLEX PO) Take by mouth.     Blood Glucose Calibration (ACCU-CHEK AVIVA) SOLN Use to check controls on glucose meter 1 each 3   Blood Glucose Monitoring Suppl (ACCU-CHEK AVIVA PLUS) w/Device KIT Use to check blood sugar two times a day 1 kit 0   Cholecalciferol (VITAMIN D-3) 125 MCG (5000 UT) TABS Take by mouth.     desipramine (NORPRAMIN) 25 MG tablet TAKE 1 TABLET AT BEDTIME 90 tablet 1   diclofenac (VOLTAREN) 75 MG EC tablet TAKE  1 TABLET TWICE DAILY 30 tablet 0   diclofenac sodium (VOLTAREN) 1 % GEL APPLY 2 GRAMS TOPICALLY FOUR TIMES A DAY AS NEEDED 300 g 1   glucose blood (ACCU-CHEK AVIVA PLUS) test strip TEST BLOOD SUGAR TWO TIMES DAILY 200 strip 3   hydrocortisone (ANUSOL-HC) 2.5 % rectal cream Place 1 Application rectally 2 (two) times daily. 30 g 1   Iron, Ferrous Sulfate, 325 (65 Fe) MG TABS Take by mouth.     latanoprost (XALATAN) 0.005 % ophthalmic solution Place 1 drop into both eyes at bedtime.     lisinopril  (ZESTRIL) 10 MG tablet TAKE 1 TABLET EVERY DAY 90 tablet 0   metFORMIN (GLUCOPHAGE-XR) 500 MG 24 hr tablet TAKE 2 TABLETS EVERY MORNING AND TAKE 1 TABLET AT NIGHT 270 tablet 0   metoprolol tartrate (LOPRESSOR) 50 MG tablet TAKE 1 TABLET TWICE DAILY 180 tablet 0   Multiple Vitamins-Minerals (CENTRUM ADULTS PO) Take 1 tablet by mouth daily.     Multiple Vitamins-Minerals (PRESERVISION AREDS PO) Take by mouth.     timolol (TIMOPTIC) 0.5 % ophthalmic solution Place 1 drop into both eyes daily.      traMADol (ULTRAM) 50 MG tablet TAKE 2 TABLETS TWICE A DAY 120 tablet 0   neomycin-bacitracin-polymyxin (NEOSPORIN) ointment Apply 1 application  topically as needed for wound care.     Pancrelipase, Lip-Prot-Amyl, (ZENPEP) 40000-126000 units CPEP Take 2 capsules with each meal and 1 capsule with a snack (Patient not taking: Reported on 03/02/2023) 300 capsule 5   Facility-Administered Medications Prior to Visit  Medication Dose Route Frequency Provider Last Rate Last Admin   betamethasone acetate-betamethasone sodium phosphate (CELESTONE) injection 3 mg  3 mg Intramuscular Once Felecia Shelling, DPM         Per HPI unless specifically indicated in ROS section below Review of Systems  Constitutional:  Negative for fatigue and fever.  HENT:  Negative for congestion.   Eyes:  Negative for pain.  Respiratory:  Negative for cough and shortness of breath.   Cardiovascular:  Negative for chest pain, palpitations and leg swelling.  Gastrointestinal:  Negative for abdominal pain.  Genitourinary:  Negative for dysuria and vaginal bleeding.  Musculoskeletal:  Negative for back pain.  Neurological:  Negative for syncope, light-headedness and headaches.  Psychiatric/Behavioral:  Negative for dysphoric mood.    Objective:  BP 102/62 (BP Location: Left Arm, Patient Position: Sitting, Cuff Size: Normal)   Pulse (!) 59   Temp (!) 97.3 F (36.3 C) (Temporal)   Ht 5' (1.524 m)   Wt 163 lb (73.9 kg)   SpO2 99%    BMI 31.83 kg/m   Wt Readings from Last 3 Encounters:  03/02/23 163 lb (73.9 kg)  02/24/23 162 lb (73.5 kg)  07/06/22 165 lb (74.8 kg)      Physical Exam Vitals and nursing note reviewed.  Constitutional:      General: She is not in acute distress.    Appearance: Normal appearance. She is well-developed. She is not ill-appearing or toxic-appearing.  HENT:     Head: Normocephalic.     Right Ear: Hearing, tympanic membrane, ear canal and external ear normal.     Left Ear: Hearing, tympanic membrane, ear canal and external ear normal.     Nose: Nose normal.  Eyes:     General: Lids are normal. Lids are everted, no foreign bodies appreciated.     Conjunctiva/sclera: Conjunctivae normal.     Pupils: Pupils are equal, round, and reactive to  light.  Neck:     Thyroid: No thyroid mass or thyromegaly.     Vascular: No carotid bruit.     Trachea: Trachea normal.  Cardiovascular:     Rate and Rhythm: Normal rate and regular rhythm.     Heart sounds: Normal heart sounds, S1 normal and S2 normal. No murmur heard.    No gallop.  Pulmonary:     Effort: Pulmonary effort is normal. No respiratory distress.     Breath sounds: Normal breath sounds. No wheezing, rhonchi or rales.  Abdominal:     General: Bowel sounds are normal. There is no distension or abdominal bruit.     Palpations: Abdomen is soft. There is no fluid wave or mass.     Tenderness: There is no abdominal tenderness. There is no guarding or rebound.     Hernia: No hernia is present.  Musculoskeletal:     Cervical back: Normal range of motion and neck supple.  Lymphadenopathy:     Cervical: No cervical adenopathy.  Skin:    General: Skin is warm and dry.     Findings: No rash.  Neurological:     Mental Status: She is alert.     Cranial Nerves: No cranial nerve deficit.     Sensory: No sensory deficit.  Psychiatric:        Mood and Affect: Mood is not anxious or depressed.        Speech: Speech normal.        Behavior:  Behavior normal. Behavior is cooperative.        Judgment: Judgment normal.    Diabetic foot exam: Normal inspection No skin breakdown No calluses  Normal DP pulses Normal sensation to light touch and monofilament Nails normal     Results for orders placed or performed in visit on 03/02/23  HM DIABETES FOOT EXAM  Result Value Ref Range   HM Diabetic Foot Exam done      COVID 19 screen:  No recent travel or known exposure to COVID19 The patient denies respiratory symptoms of COVID 19 at this time. The importance of social distancing was discussed today.   Assessment and Plan The patient's preventative maintenance and recommended screening tests for an annual wellness exam were reviewed in full today. Brought up to date unless services declined.  Counselled on the importance of diet, exercise, and its role in overall health and mortality. The patient's FH and SH was reviewed, including their home life, tobacco status, and drug and alcohol status.   DEXA: 12/2015 nml, repeat in 5 years.. she is not interested in bone density Mammo: nml 2023, scheduled, mother with breast cancer PAP/DVE: not indicated s/p abdominal hysterectomy, right ovary remaining. Sees Dr. Shawnie Pons for GYN for lichen sclerosis but has not seen in a while. No family history of ovarian cancer. She has chosen to stop DVEs.   Vaccines:Up to date with PNA 13, 23 and planning shingles vaccine, tdap due.   Has received COVID vaccine Colon cancer screening: colonoscopy  Dr Servando Snare Nml 06/2021 Hep C:  done Nonsmoker  Urine microalbumin:  uptodate  Problem List Items Addressed This Visit     CKD stage 3 due to type 2 diabetes mellitus (HCC)    Chronic, due to diabetes Stable control       COPD without exacerbation (HCC)    Mild, stable control using albuterol as needed      DM (diabetes mellitus), type 2 with renal complications (HCC)    Chronic well  controlled on metformin 500 mg XR  twice daily.. she decided  against lowering dose as feeling well and no lows.   Associated with renal complications.        High calcium levels     New issue... she has been drinking more milk lately. Also on 3 calcium supplements.       Hyperlipidemia associated with type 2 diabetes mellitus (HCC)    Chronic, poor control despite WelChol max dose.  She is statin intolerant.      Hypertension associated with diabetes (HCC)     Stable, chronic.  Continue current medication.  Lisinopril 10 mg daily, metoprolol tartrate 50 mg p.o. twice daily      Statin myopathy    Intolerant of statins in the past.      Other Visit Diagnoses     Routine general medical examination at a health care facility    -  Primary         Cathy Nora, MD

## 2023-03-02 NOTE — Assessment & Plan Note (Signed)
Chronic, due to diabetes Stable control

## 2023-03-02 NOTE — Assessment & Plan Note (Signed)
Mild, stable control using albuterol as needed

## 2023-03-02 NOTE — Assessment & Plan Note (Signed)
  Stable, chronic.  Continue current medication.  Lisinopril 10 mg daily, metoprolol tartrate 50 mg p.o. twice daily

## 2023-03-02 NOTE — Patient Instructions (Addendum)
Decrease milk or stop calcium    containing supplements.

## 2023-03-02 NOTE — Assessment & Plan Note (Signed)
Intolerant of statins in the past.

## 2023-03-02 NOTE — Assessment & Plan Note (Signed)
Chronic, poor control despite WelChol max dose.  She is statin intolerant.

## 2023-03-07 ENCOUNTER — Other Ambulatory Visit: Payer: Self-pay | Admitting: Family Medicine

## 2023-03-07 NOTE — Telephone Encounter (Signed)
Last office visit 03/02/2023 for CPE.  Last refilled 01/13/23 for #120 with no refills.  Next Appt: No future appointments with PCP.

## 2023-03-12 ENCOUNTER — Other Ambulatory Visit: Payer: Self-pay | Admitting: Family Medicine

## 2023-03-14 NOTE — Telephone Encounter (Signed)
Last office visit 03/02/2023 for CPE.  Office note states: Chronic well controlled on metformin 500 mg XR  twice daily but refill request and med list states she is taking 2 tabs in am and 1 at night.  Please advise.

## 2023-03-15 DIAGNOSIS — Z0100 Encounter for examination of eyes and vision without abnormal findings: Secondary | ICD-10-CM | POA: Diagnosis not present

## 2023-03-15 DIAGNOSIS — E119 Type 2 diabetes mellitus without complications: Secondary | ICD-10-CM | POA: Diagnosis not present

## 2023-03-15 DIAGNOSIS — Z961 Presence of intraocular lens: Secondary | ICD-10-CM | POA: Diagnosis not present

## 2023-03-15 DIAGNOSIS — H353132 Nonexudative age-related macular degeneration, bilateral, intermediate dry stage: Secondary | ICD-10-CM | POA: Diagnosis not present

## 2023-03-15 DIAGNOSIS — H401131 Primary open-angle glaucoma, bilateral, mild stage: Secondary | ICD-10-CM | POA: Diagnosis not present

## 2023-03-18 DIAGNOSIS — M5416 Radiculopathy, lumbar region: Secondary | ICD-10-CM | POA: Diagnosis not present

## 2023-03-18 DIAGNOSIS — M21372 Foot drop, left foot: Secondary | ICD-10-CM | POA: Diagnosis not present

## 2023-03-26 ENCOUNTER — Other Ambulatory Visit: Payer: Self-pay | Admitting: Family Medicine

## 2023-03-26 DIAGNOSIS — N1831 Chronic kidney disease, stage 3a: Secondary | ICD-10-CM

## 2023-03-28 ENCOUNTER — Other Ambulatory Visit: Payer: Self-pay | Admitting: Family Medicine

## 2023-03-28 NOTE — Telephone Encounter (Signed)
Prescription Request  03/28/2023  LOV: 03/02/2023  What is the name of the medication or equipment? diclofenac (VOLTAREN) 75 MG EC tablet   Have you contacted your pharmacy to request a refill? No   Which pharmacy would you like this sent to?  [facility] - [place], [place] - [address] [place] [address] Phone: [phone] Fax: [phone]   Patient notified that their request is being sent to the clinical staff for review and that they should receive a response within 2 business days.   Please advise at Mobile [phone] (mobile)

## 2023-03-28 NOTE — Telephone Encounter (Signed)
Last office visit 03/02/2023 for CPE.  Last refilled 01/12/23 for #30 with no refills.  Next appt: No future appointments with PCP.

## 2023-03-29 MED ORDER — DICLOFENAC SODIUM 75 MG PO TBEC: 75.0000 mg | DELAYED_RELEASE_TABLET | Freq: Two times a day (BID) | ORAL | 0 refills | Status: DC

## 2023-03-31 ENCOUNTER — Ambulatory Visit
Admission: RE | Admit: 2023-03-31 | Discharge: 2023-03-31 | Disposition: A | Discharge status: Home / Self Care | Payer: Medicare HMO | Source: Ambulatory Visit | Attending: Family Medicine | Admitting: Family Medicine

## 2023-03-31 DIAGNOSIS — Z1231 Encounter for screening mammogram for malignant neoplasm of breast: Secondary | ICD-10-CM | POA: Diagnosis not present

## 2023-04-09 ENCOUNTER — Other Ambulatory Visit: Payer: Self-pay | Admitting: Family Medicine

## 2023-04-12 NOTE — Telephone Encounter (Signed)
Last office visit 03/02/2023 for CPE.  Last refilled 03/07/2023 for #120 with no refills.  Next Appt: No future appointments with PCP.

## 2023-04-13 ENCOUNTER — Other Ambulatory Visit: Payer: Self-pay | Admitting: Family Medicine

## 2023-04-13 DIAGNOSIS — M5416 Radiculopathy, lumbar region: Secondary | ICD-10-CM | POA: Diagnosis not present

## 2023-04-13 DIAGNOSIS — E119 Type 2 diabetes mellitus without complications: Secondary | ICD-10-CM | POA: Diagnosis not present

## 2023-04-13 NOTE — Telephone Encounter (Signed)
Last office visit 03/02/2023 for CPE.  Last refilled 03/29/2023 for #30 with no refills.  Next appt: No future appointments with PCP.  This is for Mail Order Pharmacy.  Instructions are bid dosing.  Okay to increase to 90 day supply??

## 2023-04-15 ENCOUNTER — Other Ambulatory Visit: Payer: Self-pay | Admitting: Family Medicine

## 2023-04-19 ENCOUNTER — Telehealth: Payer: Self-pay | Admitting: Family Medicine

## 2023-04-19 NOTE — Telephone Encounter (Signed)
Prescription Request  04/19/2023  LOV: 03/02/2023 Patient called in stated that it is a injection out for alzheimers  and patients wants to know if the provider can give it to her and would like a call back if possible.   What is the name of the medication or equipment? traMADol (ULTRAM) 50 MG tablet   Have you contacted your pharmacy to request a refill? Yes   Which pharmacy would you like this sent to? [id number] - [place], [place] - [address] [place] [address] Phone: [phone] Fax: [phone]    Patient notified that their request is being sent to the clinical staff for review and that they should receive a response within 2 business days.   Please advise at [facility] [phone]  Patient called in stated that it is a new injection out for alzheimers disease that runs in her family and she thinks she may have it .paitent would like a call back if possible to see if the doctor can give her the injection.

## 2023-04-19 NOTE — Telephone Encounter (Signed)
I am not aware of what she is referring to.  Please call her to find out.

## 2023-04-19 NOTE — Telephone Encounter (Signed)
Tramadol was refilled on 04/12/2023 for #120 with no refills at [facility].    I am not aware of a new alzheimer vaccine.  Will forward to Dr. Ermalene Searing for review.

## 2023-04-20 NOTE — Telephone Encounter (Signed)
Spoke with Cathy Mullins to let her know her Tramadol refill was sent to [facility] pharmacy on 04/12/2023.  Patient states she did get notification that the Rx has been shipped so she will wait on that delivery.  I ask her to call me back if she hasn't received it by Friday.  I ask her about the Alzheimer injection she requested.  She states it is not injection but a blood test you can have done to check for alzheimer.  She has a strong family history and would like to be tested for this.  I ask her where she heard about this test and she states from [employer].

## 2023-04-26 NOTE — Telephone Encounter (Signed)
Cathy Mullins notified as instructed by telephone. Patient states understanding.

## 2023-04-26 NOTE — Telephone Encounter (Signed)
Please let the patient know that while they are working on a blood test to diagnose Alzheimer's this is not yet FDA approved in the [place] as far as I am aware.

## 2023-05-13 DIAGNOSIS — M5416 Radiculopathy, lumbar region: Secondary | ICD-10-CM | POA: Diagnosis not present

## 2023-05-19 ENCOUNTER — Other Ambulatory Visit: Payer: Self-pay | Admitting: Family Medicine

## 2023-05-25 ENCOUNTER — Other Ambulatory Visit: Payer: Self-pay | Admitting: Family Medicine

## 2023-05-25 NOTE — Telephone Encounter (Signed)
Last office visit 03/02/2023 for CPE.  Last refilled 04/12/2023 for #120 with no refills.  Next Appt: No future appointments with PCP.

## 2023-05-27 DIAGNOSIS — M5416 Radiculopathy, lumbar region: Secondary | ICD-10-CM | POA: Diagnosis not present

## 2023-05-31 ENCOUNTER — Other Ambulatory Visit: Payer: Self-pay | Admitting: Family Medicine

## 2023-06-26 ENCOUNTER — Other Ambulatory Visit: Payer: Self-pay | Admitting: Family Medicine

## 2023-06-27 NOTE — Telephone Encounter (Signed)
Last office visit 03/02/23 for MWV.  Last refilled 04/14/2023 for #180 with no refills.  Next Appt: No future appointments with PCP.

## 2023-07-26 ENCOUNTER — Other Ambulatory Visit: Payer: Self-pay | Admitting: Family Medicine

## 2023-07-26 NOTE — Telephone Encounter (Signed)
Last office visit 03/02/2023 for Medicare Wellness.  Last refilled 05/26/23 for #120 with no refills.  Next Appt: No future appointments with PCP.

## 2023-08-26 ENCOUNTER — Other Ambulatory Visit: Payer: Self-pay | Admitting: Family Medicine

## 2023-08-26 NOTE — Telephone Encounter (Signed)
Last office visit 03/02/23 for MWV.  Last refilled 07/26/23 for #120 with no refills.  Next Appt: No future appointments with PCP.

## 2023-09-16 DIAGNOSIS — H401131 Primary open-angle glaucoma, bilateral, mild stage: Secondary | ICD-10-CM | POA: Diagnosis not present

## 2023-09-16 DIAGNOSIS — E119 Type 2 diabetes mellitus without complications: Secondary | ICD-10-CM | POA: Diagnosis not present

## 2023-09-16 DIAGNOSIS — Z961 Presence of intraocular lens: Secondary | ICD-10-CM | POA: Diagnosis not present

## 2023-09-16 DIAGNOSIS — H353132 Nonexudative age-related macular degeneration, bilateral, intermediate dry stage: Secondary | ICD-10-CM | POA: Diagnosis not present

## 2023-09-27 ENCOUNTER — Other Ambulatory Visit: Payer: Self-pay | Admitting: Family Medicine

## 2023-09-27 NOTE — Telephone Encounter (Signed)
 Last office visit 03/02/23 for MWV. Last refilled 08/26/23 for #120 with no refills. Next Appt: No future appointments with PCP.

## 2023-11-18 ENCOUNTER — Other Ambulatory Visit: Payer: Self-pay | Admitting: Family Medicine

## 2023-11-18 MED ORDER — TRAMADOL HCL 50 MG PO TABS: 100.0000 mg | ORAL_TABLET | Freq: Two times a day (BID) | ORAL | 0 refills | Status: DC

## 2023-11-18 NOTE — Telephone Encounter (Signed)
 Last office visit 03/02/2023 for CPE.  Last refilled 09/27/2023 for #120 with no refills.  Next Appt: No future appointments with PCP.

## 2023-11-18 NOTE — Telephone Encounter (Signed)
 Copied from CRM [id number]. Topic: Clinical - Medication Refill >> Nov 18, 2023 12:11 PM Florestine Avers wrote: Most Recent Primary Care Visit:  Provider: Kerby Nora E  Department: [facility]  Visit Type: PHYSICAL  Date: 03/02/2023  Medication: traMADol (ULTRAM) 50 MG tablet  Has the patient contacted their pharmacy? Yes (Agent: If no, request that the patient contact the pharmacy for the refill. If patient does not wish to contact the pharmacy document the reason why and proceed with request.) (Agent: If yes, when and what did the pharmacy advise?)  Is this the correct pharmacy for this prescription? Yes If no, delete pharmacy and type the correct one.  This is the patient's preferred pharmacy:   [id number] - [place], [place] - [address] [place] [address] Phone: [phone] Fax: [phone]   Has the prescription been filled recently? Yes  Is the patient out of the medication? Yes  Has the patient been seen for an appointment in the last year OR does the patient have an upcoming appointment? Yes  Can we respond through MyChart? Yes  Agent: Please be advised that Rx refills may take up to 3 business days. We ask that you follow-up with your pharmacy.

## 2023-12-09 DIAGNOSIS — M21372 Foot drop, left foot: Secondary | ICD-10-CM | POA: Insufficient documentation

## 2023-12-19 ENCOUNTER — Other Ambulatory Visit: Payer: Self-pay | Admitting: Family Medicine

## 2023-12-19 MED ORDER — TRAMADOL HCL 50 MG PO TABS: 100.0000 mg | ORAL_TABLET | Freq: Two times a day (BID) | ORAL | 0 refills | Status: DC

## 2023-12-19 NOTE — Telephone Encounter (Signed)
 Last office visit 03/02/2023 for CPE.  Last refilled 11/18/2023 for #120 with no refills.  Next Appt: No future appointments.

## 2023-12-23 DIAGNOSIS — E119 Type 2 diabetes mellitus without complications: Secondary | ICD-10-CM | POA: Diagnosis not present

## 2023-12-23 DIAGNOSIS — M5416 Radiculopathy, lumbar region: Secondary | ICD-10-CM | POA: Diagnosis not present

## 2023-12-24 ENCOUNTER — Other Ambulatory Visit: Payer: Self-pay | Admitting: Family Medicine

## 2023-12-24 DIAGNOSIS — E1122 Type 2 diabetes mellitus with diabetic chronic kidney disease: Secondary | ICD-10-CM

## 2023-12-27 ENCOUNTER — Ambulatory Visit: Admitting: Family Medicine

## 2024-01-10 ENCOUNTER — Other Ambulatory Visit: Payer: Self-pay | Admitting: Family Medicine

## 2024-01-10 NOTE — Telephone Encounter (Signed)
 Last office visit 03/02/23 for CPE.  Last refilled 12/19/2023 for #120 with no refills.  Next appt: 01/12/24 to discuss Neuro referral.

## 2024-01-12 ENCOUNTER — Encounter: Payer: Self-pay | Admitting: Family Medicine

## 2024-01-12 ENCOUNTER — Ambulatory Visit: Payer: Self-pay | Admitting: Family Medicine

## 2024-01-12 ENCOUNTER — Ambulatory Visit: Admitting: Family Medicine

## 2024-01-12 VITALS — BP 120/72 | HR 61 | Temp 98.1°F | Ht 60.0 in | Wt 163.2 lb

## 2024-01-12 DIAGNOSIS — R413 Other amnesia: Secondary | ICD-10-CM | POA: Diagnosis not present

## 2024-01-12 DIAGNOSIS — Z7984 Long term (current) use of oral hypoglycemic drugs: Secondary | ICD-10-CM

## 2024-01-12 DIAGNOSIS — Z820 Family history of epilepsy and other diseases of the nervous system: Secondary | ICD-10-CM

## 2024-01-12 DIAGNOSIS — E785 Hyperlipidemia, unspecified: Secondary | ICD-10-CM

## 2024-01-12 DIAGNOSIS — E1169 Type 2 diabetes mellitus with other specified complication: Secondary | ICD-10-CM

## 2024-01-12 DIAGNOSIS — E1122 Type 2 diabetes mellitus with diabetic chronic kidney disease: Secondary | ICD-10-CM | POA: Diagnosis not present

## 2024-01-12 DIAGNOSIS — G521 Disorders of glossopharyngeal nerve: Secondary | ICD-10-CM | POA: Diagnosis not present

## 2024-01-12 DIAGNOSIS — N1831 Chronic kidney disease, stage 3a: Secondary | ICD-10-CM

## 2024-01-12 LAB — POCT GLYCOSYLATED HEMOGLOBIN (HGB A1C): Hemoglobin A1C: 5.5 % (ref 4.0–5.6)

## 2024-01-12 MED ORDER — DESIPRAMINE HCL 25 MG PO TABS: 25.0000 mg | ORAL_TABLET | Freq: Every day | ORAL | 1 refills | Status: DC

## 2024-01-12 NOTE — Assessment & Plan Note (Signed)
 Chronic, significant improvement on desipramine .  We discussed possible side effects and risks.  She will restart this as it was very helpful for her but she will watch for oversedation.

## 2024-01-12 NOTE — Addendum Note (Signed)
 Addended by: Wyn Heater on: 01/12/2024 03:34 PM   Modules accepted: Orders

## 2024-01-12 NOTE — Assessment & Plan Note (Addendum)
 MMSE 27/30... missed date and location, good executive functioning.  Clock drawing nml, animal recall 9 in 15 seconds.  Significant family history for Alzheimer's.  Will evaluate with labs to evaluate for secondary cause.  We did discuss that desipramine  and tramadol  can contribute to confusion/memory issues/oversedation.  She states she just takes the tramadol  at night and has not used desipramine  in 6 months.  Referral placed for neurology.  I did discuss with her that if this takes a while we could go ahead and start daily donezepil for further memory loss prevention.

## 2024-01-12 NOTE — Progress Notes (Signed)
 Patient ID: Cathy Mullins, female    DOB: [birth year], 77 y.o.   MRN: [id number]  This visit was conducted in person.  BP 120/72   Pulse 61   Temp 98.1 F (36.7 C) (Oral)   Ht 5' (1.524 m)   Wt 163 lb 4 oz (74 kg)   SpO2 97%   BMI 31.88 kg/m    CC:  Chief Complaint  Patient presents with   Referral    Wants referral to Neurologist-She is sure she has Alzheimer and Cathy Mullins   Diabetes    Subjective:   HPI: Cathy Mullins is a 78 y.o. female presenting on 01/12/2024 for Referral (Wants referral to Neurologist-She is sure she has Alzheimer and Cathy Mullins) and Diabetes   Concerns about memory loss.  She has noted  gradual decrease in last year.  Husband has noted he has to repeat herself a lot, forgetting events, people, places  Occ forgetting meds, not getting lost.  Some trouble sleeping,    Daughter helping her out a lot. Getting groceries etc.   Mother and MGM with Alzheimer as well as  on paternal aunts and cousins. She is on TCA desipramine  ( on this for glossopharyngeal neuraligia, currently off) x 6 months) and tramadol .. which both can cause memory issues, sedation Hx of  TIA  Diabetes:   Improved control.. only taking Glucophage  XL 500 mg BID. Lab Results  Component Value Date   HGBA1C 5.9 02/23/2023  Using medications without difficulties: Hypoglycemic episodes: Hyperglycemic episodes: Feet problems: Blood Sugars averaging: eye exam within last year:  She lives in [place] and is likely going to have to change primary care providers shortly.  She is due for her wellness in July but is going to try to set this up elsewhere.     Relevant past medical, surgical, family and social history reviewed and updated as indicated. Interim medical history since our last visit reviewed. Allergies and medications reviewed and updated. Outpatient Medications Prior to Visit  Medication Sig Dispense Refill   ACCU-CHEK AVIVA PLUS test strip TEST BLOOD SUGAR TWO TIMES DAILY  200 strip 3   Accu-Chek Softclix Lancets lancets TEST BLOOD SUGAR TWICE DAILY 200 each 3   Alcohol Swabs (DROPSAFE ALCOHOL PREP) 70 % PADS USE TWO TIMES DAILY 200 each 3   B Complex Vitamins (VITAMIN B COMPLEX PO) Take by mouth.     Blood Glucose Calibration (ACCU-CHEK AVIVA) SOLN Use to check controls on glucose meter 1 each 3   Blood Glucose Monitoring Suppl (ACCU-CHEK AVIVA PLUS) w/Device KIT Use to check blood sugar two times a day 1 kit 0   Cholecalciferol  (VITAMIN D -3) 125 MCG (5000 UT) TABS Take by mouth.     hydrocortisone  (ANUSOL -HC) 2.5 % rectal cream Place 1 Application rectally 2 (two) times daily. 30 g 1   latanoprost  (XALATAN ) 0.005 % ophthalmic solution Place 1 drop into both eyes at bedtime.     lisinopril  (ZESTRIL ) 10 MG tablet TAKE 1 TABLET EVERY DAY 90 tablet 3   metFORMIN  (GLUCOPHAGE -XR) 500 MG 24 hr tablet TAKE 1 TABLETS EVERY MORNING AND TAKE 1 TABLET AT NIGHT 270 tablet 3   metoprolol  tartrate (LOPRESSOR ) 50 MG tablet TAKE 1 TABLET TWICE DAILY 180 tablet 3   Multiple Vitamins-Minerals (CENTRUM ADULTS PO) Take 1 tablet by mouth daily.     Multiple Vitamins-Minerals (PRESERVISION AREDS PO) Take by mouth.     timolol  (TIMOPTIC ) 0.5 % ophthalmic solution Place 1 drop into both eyes  daily.      traMADol  (ULTRAM ) 50 MG tablet TAKE 2 TABLETS TWICE DAILY 120 tablet 0   desipramine  (NORPRAMIN ) 25 MG tablet TAKE 1 TABLET AT BEDTIME 90 tablet 1   diclofenac  (VOLTAREN ) 75 MG EC tablet TAKE 1 TABLET TWICE DAILY 180 tablet 3   diclofenac  sodium (VOLTAREN ) 1 % GEL APPLY 2 GRAMS TOPICALLY FOUR TIMES A DAY AS NEEDED 300 g 1   Iron, Ferrous Sulfate, 325 (65 Fe) MG TABS Take by mouth.     Facility-Administered Medications Prior to Visit  Medication Dose Route Frequency Provider Last Rate Last Admin   betamethasone  acetate-betamethasone  sodium phosphate (CELESTONE ) injection 3 mg  3 mg Intramuscular Once Dot Gazella, DPM         Per HPI unless specifically indicated in ROS section  below Review of Systems  Constitutional:  Negative for fatigue and fever.  HENT:  Negative for congestion.   Eyes:  Negative for pain.  Respiratory:  Negative for cough and shortness of breath.   Cardiovascular:  Negative for chest pain, palpitations and leg swelling.  Gastrointestinal:  Negative for abdominal pain.  Genitourinary:  Negative for dysuria and vaginal bleeding.  Musculoskeletal:  Negative for back pain.  Neurological:  Negative for syncope, light-headedness and headaches.  Psychiatric/Behavioral:  Negative for dysphoric mood.    Objective:  BP 120/72   Pulse 61   Temp 98.1 F (36.7 C) (Oral)   Ht 5' (1.524 m)   Wt 163 lb 4 oz (74 kg)   SpO2 97%   BMI 31.88 kg/m   Wt Readings from Last 3 Encounters:  01/12/24 163 lb 4 oz (74 kg)  03/02/23 163 lb (73.9 kg)  02/24/23 162 lb (73.5 kg)      MMSE 27/30... missed date and location, good executive functioning.  Clock drawing nml, animal recall 9 in 15 seconds. Physical Exam Constitutional:      General: She is not in acute distress.    Appearance: Normal appearance. She is well-developed. She is not ill-appearing or toxic-appearing.  HENT:     Head: Normocephalic.     Right Ear: Hearing, tympanic membrane, ear canal and external ear normal. Tympanic membrane is not erythematous, retracted or bulging.     Left Ear: Hearing, tympanic membrane, ear canal and external ear normal. Tympanic membrane is not erythematous, retracted or bulging.     Nose: No mucosal edema or rhinorrhea.     Right Sinus: No maxillary sinus tenderness or frontal sinus tenderness.     Left Sinus: No maxillary sinus tenderness or frontal sinus tenderness.     Mouth/Throat:     Pharynx: Uvula midline.  Eyes:     General: Lids are normal. Lids are everted, no foreign bodies appreciated.     Conjunctiva/sclera: Conjunctivae normal.     Pupils: Pupils are equal, round, and reactive to light.  Neck:     Thyroid : No thyroid  mass or thyromegaly.      Vascular: No carotid bruit.     Trachea: Trachea normal.  Cardiovascular:     Rate and Rhythm: Normal rate and regular rhythm.     Pulses: Normal pulses.     Heart sounds: Normal heart sounds, S1 normal and S2 normal. No murmur heard.    No friction rub. No gallop.  Pulmonary:     Effort: Pulmonary effort is normal. No tachypnea or respiratory distress.     Breath sounds: Normal breath sounds. No decreased breath sounds, wheezing, rhonchi or rales.  Abdominal:  General: Bowel sounds are normal.     Palpations: Abdomen is soft.     Tenderness: There is no abdominal tenderness.  Musculoskeletal:     Cervical back: Normal range of motion and neck supple.  Skin:    General: Skin is warm and dry.     Findings: No rash.  Neurological:     Mental Status: She is alert and oriented to person, place, and time.     GCS: GCS eye subscore is 4. GCS verbal subscore is 5. GCS motor subscore is 6.     Cranial Nerves: No cranial nerve deficit.     Sensory: No sensory deficit.     Motor: No abnormal muscle tone.     Coordination: Coordination normal.     Gait: Gait normal.     Deep Tendon Reflexes: Reflexes are normal and symmetric.     Comments: Nml cerebellar exam   No papilledema  Psychiatric:        Mood and Affect: Mood is not anxious or depressed.        Speech: Speech normal.        Behavior: Behavior normal. Behavior is cooperative.        Thought Content: Thought content normal.        Cognition and Memory: Memory is not impaired. She does not exhibit impaired recent memory or impaired remote memory.        Judgment: Judgment normal.       Results for orders placed or performed in visit on 03/22/23  HM DIABETES EYE EXAM   Collection Time: 03/15/23  2:33 PM  Result Value Ref Range   HM Diabetic Eye Exam No Retinopathy No Retinopathy    Assessment and Plan  Type 2 diabetes mellitus with stage 3a chronic kidney disease, without long-term current use of insulin   (HCC) Assessment & Plan: Chronic, well-controlled on current regimen.  No benefit in pushing A1c down to 5.5.  She can consider decreasing Glucophage  XL to once daily.  Orders: -     Comprehensive metabolic panel with GFR -     Lipid panel -     Microalbumin / creatinine urine ratio  Memory loss Assessment & Plan: MMSE 27/30... missed date and location, good executive functioning.  Clock drawing nml, animal recall 9 in 15 seconds.  Significant family history for Alzheimer's.  Will evaluate with labs to evaluate for secondary cause.  We did discuss that desipramine  and tramadol  can contribute to confusion/memory issues/oversedation.  She states she just takes the tramadol  at night and has not used desipramine  in 6 months.  Referral placed for neurology.  I did discuss with her that if this takes a while we could go ahead and start daily donezepil for further memory loss prevention.  Orders: -     CBC with Differential/Platelet -     Vitamin B12 -     TSH -     VITAMIN D  25 Hydroxy (Vit-D Deficiency, Fractures) -     Ambulatory referral to Neurology  Family history of Alzheimer disease  Glossopharyngeal neuralgia Assessment & Plan: Chronic, significant improvement on desipramine .  We discussed possible side effects and risks.  She will restart this as it was very helpful for her but she will watch for oversedation.   Hyperlipidemia associated with type 2 diabetes mellitus (HCC) Assessment & Plan: Due for reevaluation   Other orders -     Desipramine  HCl; Take 1 tablet (25 mg total) by mouth at bedtime.  Dispense:  90 tablet; Refill: 1    No follow-ups on file.   Herby Lolling, MD

## 2024-01-12 NOTE — Assessment & Plan Note (Signed)
 Due for reevaluation

## 2024-01-12 NOTE — Assessment & Plan Note (Signed)
 Chronic, well-controlled on current regimen.  No benefit in pushing A1c down to 5.5.  She can consider decreasing Glucophage  XL to once daily.

## 2024-01-13 LAB — CBC WITH DIFFERENTIAL/PLATELET
Basophils Absolute: 0.1 10*3/uL (ref 0.0–0.1)
Basophils Relative: 0.8 % (ref 0.0–3.0)
Eosinophils Absolute: 0.3 10*3/uL (ref 0.0–0.7)
Eosinophils Relative: 3.5 % (ref 0.0–5.0)
HCT: 37.7 % (ref 36.0–46.0)
Hemoglobin: 12.8 g/dL (ref 12.0–15.0)
Lymphocytes Relative: 31.6 % (ref 12.0–46.0)
Lymphs Abs: 3 10*3/uL (ref 0.7–4.0)
MCHC: 34.1 g/dL (ref 30.0–36.0)
MCV: 88.5 fl (ref 78.0–100.0)
Monocytes Absolute: 0.6 10*3/uL (ref 0.1–1.0)
Monocytes Relative: 6.6 % (ref 3.0–12.0)
Neutro Abs: 5.4 10*3/uL (ref 1.4–7.7)
Neutrophils Relative %: 57.5 % (ref 43.0–77.0)
Platelets: 368 10*3/uL (ref 150.0–400.0)
RBC: 4.26 Mil/uL (ref 3.87–5.11)
RDW: 13.6 % (ref 11.5–15.5)
WBC: 9.4 10*3/uL (ref 4.0–10.5)

## 2024-01-13 LAB — COMPREHENSIVE METABOLIC PANEL WITH GFR
ALT: 39 U/L — ABNORMAL HIGH (ref 0–35)
AST: 25 U/L (ref 0–37)
Albumin: 4.2 g/dL (ref 3.5–5.2)
Alkaline Phosphatase: 58 U/L (ref 39–117)
BUN: 29 mg/dL — ABNORMAL HIGH (ref 6–23)
CO2: 28 meq/L (ref 19–32)
Calcium: 10.8 mg/dL — ABNORMAL HIGH (ref 8.4–10.5)
Chloride: 102 meq/L (ref 96–112)
Creatinine, Ser: 1.39 mg/dL — ABNORMAL HIGH (ref 0.40–1.20)
GFR: 36.54 mL/min — ABNORMAL LOW (ref >60.00)
Glucose, Bld: 95 mg/dL (ref 70–99)
Potassium: 5.3 meq/L — ABNORMAL HIGH (ref 3.5–5.1)
Sodium: 137 meq/L (ref 135–145)
Total Bilirubin: 0.3 mg/dL (ref 0.2–1.2)
Total Protein: 7.1 g/dL (ref 6.0–8.3)

## 2024-01-13 LAB — LIPID PANEL
Cholesterol: 277 mg/dL — ABNORMAL HIGH (ref 0–200)
HDL: 61.3 mg/dL (ref >39.00)
LDL Cholesterol: 166 mg/dL — ABNORMAL HIGH (ref 0–99)
NonHDL: 215.8
Total CHOL/HDL Ratio: 5
Triglycerides: 248 mg/dL — ABNORMAL HIGH (ref 0.0–149.0)
VLDL: 49.6 mg/dL — ABNORMAL HIGH (ref 0.0–40.0)

## 2024-01-13 LAB — MICROALBUMIN / CREATININE URINE RATIO
Creatinine,U: 54.9 mg/dL
Microalb Creat Ratio: 41.1 mg/g — ABNORMAL HIGH (ref 0.0–30.0)
Microalb, Ur: 2.3 mg/dL — ABNORMAL HIGH (ref 0.0–1.9)

## 2024-01-13 LAB — VITAMIN B12: Vitamin B-12: 671 pg/mL (ref 211–911)

## 2024-01-13 LAB — TSH: TSH: 2.93 u[IU]/mL (ref 0.35–5.50)

## 2024-01-13 LAB — VITAMIN D 25 HYDROXY (VIT D DEFICIENCY, FRACTURES): VITD: 56.85 ng/mL (ref 30.00–100.00)

## 2024-01-14 ENCOUNTER — Other Ambulatory Visit: Payer: Self-pay | Admitting: Family Medicine

## 2024-01-14 DIAGNOSIS — N1831 Chronic kidney disease, stage 3a: Secondary | ICD-10-CM

## 2024-01-16 DIAGNOSIS — M5416 Radiculopathy, lumbar region: Secondary | ICD-10-CM | POA: Diagnosis not present

## 2024-01-16 NOTE — Telephone Encounter (Signed)
 lvm for pt to call office to schedule appt.

## 2024-01-16 NOTE — Telephone Encounter (Signed)
 Please schedule CPE with Dr. Cherlyn Cornet after MWV scheduled on 02/29/24.  She has already had her labs done as well.

## 2024-01-19 ENCOUNTER — Encounter: Payer: Self-pay | Admitting: *Deleted

## 2024-01-19 ENCOUNTER — Telehealth: Payer: Self-pay | Admitting: *Deleted

## 2024-01-19 NOTE — Telephone Encounter (Signed)
 Noted. Referral information attached to referral order.

## 2024-01-19 NOTE — Telephone Encounter (Signed)
 Copied from CRM [id number]. Topic: Referral - Question >> Jan 19, 2024  1:19 PM Deaijah H wrote: Reason for CRM: Patient would like to let Dr. Cherlyn Cornet know the doctor she chose is Hem[facility] K. [facility]ole Phone: [phone] [facility].

## 2024-01-19 NOTE — Addendum Note (Signed)
 Addended byHerby Lolling E on: 01/19/2024 05:00 PM   Modules accepted: Orders

## 2024-02-09 DIAGNOSIS — M48061 Spinal stenosis, lumbar region without neurogenic claudication: Secondary | ICD-10-CM | POA: Diagnosis not present

## 2024-02-09 DIAGNOSIS — M47816 Spondylosis without myelopathy or radiculopathy, lumbar region: Secondary | ICD-10-CM | POA: Diagnosis not present

## 2024-02-24 DIAGNOSIS — M5416 Radiculopathy, lumbar region: Secondary | ICD-10-CM | POA: Diagnosis not present

## 2024-02-29 ENCOUNTER — Ambulatory Visit (INDEPENDENT_AMBULATORY_CARE_PROVIDER_SITE_OTHER): Payer: Medicare HMO

## 2024-02-29 VITALS — Ht 60.0 in | Wt 163.0 lb

## 2024-02-29 DIAGNOSIS — Z0000 Encounter for general adult medical examination without abnormal findings: Secondary | ICD-10-CM | POA: Diagnosis not present

## 2024-02-29 NOTE — Patient Instructions (Signed)
 Cathy Mullins , Thank you for taking time out of your busy schedule to complete your Annual Wellness Visit with me. I enjoyed our conversation and look forward to speaking with you again next year. I, as well as your care team,  appreciate your ongoing commitment to your health goals. Please review the following plan we discussed and let me know if I can assist you in the future. Your Game plan/ To Do List    Follow up Visits: Next Medicare [name] with our clinical staff: 03/01/24 @ 1:40pm televisit   Have you seen your provider in the last 6 months (3 months if uncontrolled diabetes)? Yes Next Office Visit with your provider: 03/27/24 physical  Clinician Recommendations:  Aim for 30 minutes of exercise or brisk walking, 6-8 glasses of water , and 5 servings of fruits and vegetables each day.       This is a list of the screening recommended for you and due dates:  Health Maintenance  Topic Date Due   Zoster (Shingles) Vaccine (1 of 2) Never done   DTaP/Tdap/Td vaccine (2 - Tdap) 03/09/2014   COVID-19 Vaccine (5 - 2024-25 season) 04/10/2023   DEXA scan (bone density measurement)  03/01/2024*   Complete foot exam   03/01/2024   Flu Shot  03/09/2024   Eye exam for diabetics  03/14/2024   Mammogram  03/30/2024   Hemoglobin A1C  07/13/2024   Yearly kidney function blood test for diabetes  01/11/2025   Yearly kidney health urinalysis for diabetes  01/11/2025   Medicare Annual Wellness Visit  02/28/2025   Pneumococcal Vaccine for age over 69  Completed   Hepatitis C Screening  Completed   Hepatitis B Vaccine  Aged Out   HPV Vaccine  Aged Out   Meningitis B Vaccine  Aged Out   Colon Cancer Screening  Discontinued  *Topic was postponed. The date shown is not the original due date.    Advanced directives: (Declined) Advance directive discussed with you today. Even though you declined this today, please call our office should you change your mind, and we can give you the proper paperwork for you to  fill out. Advance Care Planning is important because it:  [x]  Makes sure you receive the medical care that is consistent with your values, goals, and preferences  [x]  It provides guidance to your family and loved ones and reduces their decisional burden about whether or not they are making the right decisions based on your wishes.  Follow the link provided in your after visit summary or read over the paperwork we have mailed to you to help you started getting your Advance Directives in place. If you need assistance in completing these, please reach out to us  so that we can help you!

## 2024-02-29 NOTE — Progress Notes (Signed)
 Please attest and cosign this visit due to patients primary care provider not being in the office at the time the visit was completed.    Subjective:   Cathy Mullins is a 78 y.o. who presents for a Medicare Wellness preventive visit.  As a reminder, Annual Wellness Visits don't include a physical exam, and some assessments may be limited, especially if this visit is performed virtually. We may recommend an in-person follow-up visit with your provider if needed.  Visit Complete: Virtual I connected with  Cathy Mullins on 02/29/24 by a audio enabled telemedicine application and verified that I am speaking with the correct person using two identifiers.  Patient Location: Home  Provider Location: Home Office  I discussed the limitations of evaluation and management by telemedicine. The patient expressed understanding and agreed to proceed.  Vital Signs: Because this visit was a virtual/telehealth visit, some criteria may be missing or patient reported. Any vitals not documented were not able to be obtained and vitals that have been documented are patient reported.  VideoDeclined- This patient declined Librarian, academic. Therefore the visit was completed with audio only.  Persons Participating in Visit: Patient.  [name] Questionnaire: Yes: Patient Medicare [name] questionnaire was completed by the patient on 02/28/24; I have confirmed that all information answered by patient is correct and no changes since this date.  Cardiac Risk Factors include: advanced age (>33men, >79 women);diabetes mellitus;dyslipidemia;hypertension;sedentary lifestyle;obesity (BMI >30kg/m2)     Objective:    Today's Vitals   02/29/24 1343  Weight: 163 lb (73.9 kg)  Height: 5' (1.524 m)   Body mass index is 31.83 kg/m.     02/29/2024    2:05 PM 02/24/2023    2:52 PM 12/31/2021    3:35 PM 06/29/2021    7:00 AM 12/18/2019    3:33 PM 02/14/2018   10:40 AM 02/04/2017   12:25 PM  Advanced  Directives  Does Patient Have a Medical Advance Directive? No No No Yes Yes Yes  Yes   Type of Ecologist of [place];Living will Healthcare Power of [place];Living will Healthcare Power of [place];Living will  Does patient want to make changes to medical advance directive?    No - Patient declined     Copy of Healthcare Power of Attorney in Chart?    No - copy requested No - copy requested No - copy requested  No - copy requested   Would patient like information on creating a medical advance directive?  No - Patient declined No - Patient declined         Data saved with a previous flowsheet row definition    Current Medications (verified) Outpatient Encounter Medications as of 02/29/2024  Medication Sig   Accu-Chek Softclix Lancets lancets TEST BLOOD SUGAR TWICE DAILY   Alcohol Swabs (DROPSAFE ALCOHOL PREP) 70 % PADS USE TWO TIMES DAILY   Blood Glucose Calibration (ACCU-CHEK AVIVA) SOLN Use to check controls on glucose meter   Blood Glucose Monitoring Suppl (ACCU-CHEK AVIVA PLUS) w/Device KIT Use to check blood sugar two times a day   desipramine  (NORPRAMIN ) 25 MG tablet Take 1 tablet (25 mg total) by mouth at bedtime.   glucose blood (ACCU-CHEK AVIVA PLUS) test strip TEST BLOOD SUGAR TWICE DAILY   hydrocortisone  (ANUSOL -HC) 2.5 % rectal cream Place 1 Application rectally 2 (two) times daily.   latanoprost  (XALATAN ) 0.005 % ophthalmic solution Place 1 drop into both eyes at bedtime.  lisinopril  (ZESTRIL ) 10 MG tablet TAKE 1 TABLET EVERY DAY   metFORMIN  (GLUCOPHAGE -XR) 500 MG 24 hr tablet TAKE 1 TABLETS EVERY MORNING AND TAKE 1 TABLET AT NIGHT   Multiple Vitamins-Minerals (PRESERVISION AREDS PO) Take by mouth.   timolol  (TIMOPTIC ) 0.5 % ophthalmic solution Place 1 drop into both eyes daily.    B Complex Vitamins (VITAMIN B COMPLEX PO) Take by mouth.   Cholecalciferol  (VITAMIN D -3) 125 MCG (5000 UT) TABS Take by mouth.   metoprolol   tartrate (LOPRESSOR ) 50 MG tablet TAKE 1 TABLET TWICE DAILY   Multiple Vitamins-Minerals (CENTRUM ADULTS PO) Take 1 tablet by mouth daily.   traMADol  (ULTRAM ) 50 MG tablet TAKE 2 TABLETS TWICE DAILY   Facility-Administered Encounter Medications as of 02/29/2024  Medication   betamethasone  acetate-betamethasone  sodium phosphate (CELESTONE ) injection 3 mg    Allergies (verified) Amoxicillin-pot clavulanate, Codeine, Diclofenac , Hydrocodone-acetaminophen , Lovastatin, Soma [carisoprodol], Sulfonamide derivatives, Tizanidine, Sulfa antibiotics, and Tape   History: Past Medical History:  Diagnosis Date   Allergy    Asthma with COPD (HCC)    Complication of anesthesia    Difficulty waking after Spinal injection (2006)   Depression    daughter was very sick, depression began during this period of time    Diabetes mellitus    Esophagitis    Fibromyalgia    GERD (gastroesophageal reflux disease)    Glaucoma    History of colon polyps    HTN (hypertension)    Hyperlipidemia    Lichen sclerosus    Migraine    Perforating folliculitis    Spinal stenosis    Tachycardia    TIA (transient ischemic attack)    Past Surgical History:  Procedure Laterality Date   ABDOMINAL HYSTERECTOMY     right ovary remaining   APPENDECTOMY     BREAST CYST ASPIRATION Bilateral    BREAST EXCISIONAL BIOPSY Right 1982   NEG   BREAST SURGERY     right(benign)   CARPAL TUNNEL RELEASE     CESAREAN SECTION     COLONOSCOPY WITH PROPOFOL  N/A 06/29/2021   Procedure: COLONOSCOPY WITH PROPOFOL ;  Surgeon: Jinny Carmine, MD;  Location: [facility];  Service: Endoscopy;  Laterality: N/A;  Diabetic   EPIDURAL BLOCK INJECTION  2013   ESOPHAGOGASTRODUODENOSCOPY (EGD) WITH PROPOFOL  N/A 06/29/2021   Procedure: ESOPHAGOGASTRODUODENOSCOPY (EGD) WITH PROPOFOL ;  Surgeon: Jinny Carmine, MD;  Location: [facility];  Service: Endoscopy;  Laterality: N/A;   KNEE ARTHROSCOPY W/ MENISCAL REPAIR     left   LIPOMA  EXCISION     2 times left leg   lumpectomy     TONSILLECTOMY     TRIGGER FINGER RELEASE     Family History  Problem Relation Age of Onset   Cancer Mother        breast   Stroke Mother    Valvular heart disease Mother    Breast cancer Mother 33   Alzheimer's disease Mother    Kidney disease Father    Stroke Father    Coronary artery disease Father    Parkinson's disease Father    Diabetes Brother    Alzheimer's disease Maternal Grandmother    Diabetes Maternal Aunt    Alzheimer's disease Maternal Aunt    Alzheimer's disease Maternal Aunt    Colon cancer Neg Hx    Pancreatic cancer Neg Hx    Stomach cancer Neg Hx    Social History   Socioeconomic History   Marital status: Married    Spouse name: Not on  file   Number of children: 1   Years of education: Not on file   Highest education level: Associate degree: academic program  Occupational History   Occupation: retired Immunologist estate)    Associate Professor: retired  Tobacco Use   Smoking status: Former    Current packs/day: 0.00    Types: Cigarettes    Quit date: 09/27/1988    Years since quitting: 35.4   Smokeless tobacco: Never  Vaping Use   Vaping status: Never Used  Substance and Sexual Activity   Alcohol use: No    Alcohol/week: 0.0 standard drinks of alcohol   Drug use: No   Sexual activity: Not on file  Other Topics Concern   Not on file  Social History Narrative   Regular exercise--no   Diet: fruits and veggies, calcium, water , avoids fast food   Right handed   Caffeine use: 1 quart to 1/2 gallon of tea daily                  Social Drivers of Health   Financial Resource Strain: Low Risk  (02/28/2024)   Overall Financial Resource Strain (CARDIA)    Difficulty of Paying Living Expenses: Not hard at all  Food Insecurity: No Food Insecurity (02/28/2024)   Hunger Vital Sign    Worried About Running Out of Food in the Last Year: Never true    Ran Out of Food in the Last Year: Never true  Transportation  Needs: No Transportation Needs (02/28/2024)   PRAPARE - Administrator, Civil Service (Medical): No    Lack of Transportation (Non-Medical): No  Physical Activity: Inactive (02/28/2024)   Exercise Vital Sign    Days of Exercise per Week: 0 days    Minutes of Exercise per Session: Not on file  Stress: No Stress Concern Present (02/28/2024)   [employer] of Occupational Health - Occupational Stress Questionnaire    Feeling of Stress: Not at all  Social Connections: Moderately Isolated (02/28/2024)   Social Connection and Isolation Panel    Frequency of Communication with Friends and Family: More than three times a week    Frequency of Social Gatherings with Friends and Family: Patient declined    Attends Religious Services: Patient declined    Database administrator or Organizations: No    Attends Engineer, structural: Not on file    Marital Status: Married    Tobacco Counseling Counseling given: Not Answered    Clinical Intake:           Nutritional Status: BMI > 30  Obese Nutritional Risks: None Diabetes: Yes CBG done?: No Did pt. bring in CBG monitor from home?: No  Lab Results  Component Value Date   HGBA1C 5.5 01/12/2024   HGBA1C 5.9 02/23/2023   HGBA1C 5.8 (A) 07/06/2022     How often do you need to have someone help you when you read instructions, pamphlets, or other written materials from your doctor or pharmacy?: 1 - Never  Interpreter Needed?: No  Comments: lives with husband Information entered by :: B.Delesia Martinek,LPN   Activities of Daily Living     02/28/2024   10:49 AM  In your present state of health, do you have any difficulty performing the following activities:  Hearing? 0  Vision? 1  Difficulty concentrating or making decisions? 0  Walking or climbing stairs? 1  Dressing or bathing? 0  Doing errands, shopping? 0  Preparing Food and eating ? N  Using the Toilet? N  In the past six months, have you accidently leaked  urine? N  Do you have problems with loss of bowel control? N  Managing your Medications? N  Managing your Finances? N  Housekeeping or managing your Housekeeping? N    Patient Care Team: Avelina Greig BRAVO, MD as PCP - General Myra Rosaline FALCON, [facility] (Inactive) as Pharmacist (Pharmacist) Mittie Gaskin, MD as Referring Physician (Ophthalmology)  I have updated your Care Teams any recent Medical Services you may have received from other providers in the past year.     Assessment:   This is a routine wellness examination for Nikea.  Hearing/Vision screen Hearing Screening - Comments:: Pt says her hearing is good Vision Screening - Comments:: Pt says her vision is cloudy in lft eye and blurry in rt eye Dr Mittie    Goals Addressed             This Visit's Progress    DIET - EAT MORE FRUITS AND VEGETABLES   On track    02/29/24     Patient Stated   Not on track    Not be in pain.       Depression Screen     02/29/2024    1:59 PM 02/24/2023    2:51 PM 01/05/2022    3:02 PM 12/31/2021    3:32 PM 10/06/2021    2:25 PM 12/26/2020    3:28 PM 12/18/2019    3:37 PM  PHQ 2/9 Scores  PHQ - 2 Score 1 0 0 0 0 0 0  PHQ- 9 Score   5   4 0    Fall Risk     02/28/2024   10:49 AM 01/12/2024    2:47 PM 03/02/2023    9:08 AM 02/24/2023    2:54 PM 01/05/2022    3:02 PM  Fall Risk   Falls in the past year? 1 1 1 1 1   Number falls in past yr: 0 0 1 1 0  Comment    Fell on area rug   Injury with Fall? 0 1 1 1 1   Risk for fall due to : Impaired vision;Impaired balance/gait;Impaired mobility History of fall(s) Impaired balance/gait;Orthopedic patient Impaired balance/gait;Orthopedic patient Impaired balance/gait  Follow up Education provided;Falls prevention discussed Falls evaluation completed Falls evaluation completed Education provided;Falls prevention discussed;Falls evaluation completed Falls evaluation completed      Data saved with a previous flowsheet row definition     MEDICARE RISK AT HOME:  Medicare Risk at Home Any stairs in or around the home?: (Patient-Rptd) No If so, are there any without handrails?: (Patient-Rptd) No Home free of loose throw rugs in walkways, pet beds, electrical cords, etc?: (Patient-Rptd) Yes Adequate lighting in your home to reduce risk of falls?: (Patient-Rptd) Yes Life alert?: (Patient-Rptd) No Use of a cane, walker or w/c?: (Patient-Rptd) Yes Grab bars in the bathroom?: (Patient-Rptd) Yes Shower chair or bench in shower?: (Patient-Rptd) Yes Elevated toilet seat or a handicapped toilet?: (Patient-Rptd) Yes  TIMED UP AND GO:  Was the test performed?  No  Cognitive Function: 6CIT completed    12/18/2019    3:40 PM 02/14/2018   10:35 AM 02/04/2017   12:26 PM  MMSE - Mini Mental State Exam  Orientation to time 5 5 5    Orientation to Place 5 5 5    Registration 3 3 3    Attention/ Calculation 5 0 0   Recall 3 3 3    Language- name 2 objects  0 0   Language-  repeat 1 1 1   Language- follow 3 step command  3 3   Language- read & follow direction  0 0   Write a sentence  0 0   Copy design  0 0   Total score  20 20      Data saved with a previous flowsheet row definition        02/29/2024    2:35 PM 02/24/2023    2:57 PM 12/31/2021    3:40 PM  6CIT Screen  What Year? 0 points 0 points 0 points  What month? 0 points 0 points 0 points  What time? 0 points 0 points 0 points  Count back from 20 0 points 0 points 0 points  Months in reverse 0 points 0 points 0 points  Repeat phrase 0 points 0 points 0 points  Total Score 0 points 0 points 0 points    Immunizations Immunization History  Administered Date(s) Administered   Fluad Quad(high Dose 65+) 07/26/2019, 06/24/2020, 07/06/2022, 08/17/2023   Influenza Split 06/13/2012   Influenza Whole 08/20/2003, 05/30/2007, 05/30/2008, 06/06/2009, 06/17/2010   Influenza, High Dose Seasonal PF 06/23/2021   Influenza,inj,Quad PF,6+ Mos 06/29/2013, 07/09/2014, 04/25/2015,  05/20/2016, 05/13/2017, 06/29/2018   Influenza-Unspecified 06/29/2013, 07/09/2014, 04/25/2015, 05/20/2016, 05/13/2017   PFIZER(Purple Top)SARS-COV-2 Vaccination 10/04/2019, 10/25/2019, 06/09/2020   Pfizer Covid-19 Vaccine Bivalent Booster 53yrs & up 06/23/2021   Pneumococcal Conjugate-13 07/09/2014   Pneumococcal Polysaccharide-23 06/04/2011   Td 03/09/2004    Screening Tests Health Maintenance  Topic Date Due   Zoster Vaccines- Shingrix (1 of 2) Never done   DTaP/Tdap/Td (2 - Tdap) 03/09/2014   COVID-19 Vaccine (5 - 2024-25 season) 04/10/2023   DEXA SCAN  03/01/2024 (Originally 12/14/2020)   FOOT EXAM  03/01/2024   INFLUENZA VACCINE  03/09/2024   OPHTHALMOLOGY EXAM  03/14/2024   MAMMOGRAM  03/30/2024   HEMOGLOBIN A1C  07/13/2024   Diabetic kidney evaluation - eGFR measurement  01/11/2025   Diabetic kidney evaluation - Urine ACR  01/11/2025   Medicare Annual Wellness ([name])  02/28/2025   Pneumococcal Vaccine: 50+ Years  Completed   Hepatitis C Screening  Completed   Hepatitis B Vaccines  Aged Out   HPV VACCINES  Aged Out   Meningococcal B Vaccine  Aged Out   Colonoscopy  Discontinued    Health Maintenance  Health Maintenance Due  Topic Date Due   Zoster Vaccines- Shingrix (1 of 2) Never done   DTaP/Tdap/Td (2 - Tdap) 03/09/2014   COVID-19 Vaccine (5 - 2024-25 season) 04/10/2023   Health Maintenance Items Addressed:   Additional Screening:  Vision Screening: Recommended annual ophthalmology exams for early detection of glaucoma and other disorders of the eye. Would you like a referral to an eye doctor? No    Dental Screening: Recommended annual dental exams for proper oral hygiene  Community Resource Referral / Chronic Care Management: CRR required this visit?  No   CCM required this visit?  Appt scheduled with PCP   Plan:    I have personally reviewed and noted the following in the patient's chart:   Medical and social history Use of alcohol, tobacco or  illicit drugs  Current medications and supplements including opioid prescriptions. Patient is not currently taking opioid prescriptions. Functional ability and status Nutritional status Physical activity Advanced directives List of other physicians Hospitalizations, surgeries, and ER visits in previous 12 months Vitals Screenings to include cognitive, depression, and falls Referrals and appointments  In addition, I have reviewed and discussed with patient certain preventive  protocols, quality metrics, and best practice recommendations. A written personalized care plan for preventive services as well as general preventive health recommendations were provided to patient.   Erminio LITTIE Saris, LPN   [id number]   After Visit Summary: (MyChart) Due to this being a telephonic visit, the after visit summary with patients personalized plan was offered to patient via MyChart   Notes: Please refer to Routing Comments.   To PCP: Pt [name] done. Pt relays she has stopped taking the Metoprolol  and Tramadol . She is asking what can you give her to replace as it was giving her night terrors, memory issues, and insomnia. She has an appt next month for CPE.Thank you! Erminio

## 2024-03-01 ENCOUNTER — Ambulatory Visit: Payer: Self-pay | Admitting: *Deleted

## 2024-03-01 NOTE — Telephone Encounter (Signed)
 I spoke with pt; pt notified as instructed by Dr Watt and pt voiced understanding,pt said she is resting now and no chest issues at this time. Pt has not called Dr Tresia office yet because pt said in the past she has had heart false alarms and feeling OK riow. Pt said if she  did have any more chest symptoms pt said she will call 911 and go to ED. Pt appreciates Dr Copland's concern. Sending FYI to Dr Watt.SABRA

## 2024-03-01 NOTE — Telephone Encounter (Signed)
 I spoke with pt; pt said she feels some better BP 130/80 P 74 now and pt knows Dr Avelina will giver her different med to take place of Metoprolol ; pt has researched that med and said it has a lot of side effects and should not be on the market.pt took metoprolol  on 02/29/24 and had horrible night terrors and this morning had pressure feeling in chest. Pt said she is not taking anymore of that med. Pt said she knows she told the other triage nurse she would go to Ed but pt said some transportation issues and pt does not want EMS called..I advised pt Dr Avelina is not in office this week and there are no available appts at [facility] this after noon .and pt said she would call Dr Perla card and see what he can do. Again UC & ED precautions given and pt voiced understanding. Sending note to Dr Watt.

## 2024-03-01 NOTE — Telephone Encounter (Signed)
 If she is having chest pain and pain down her left arm, I would take that seriously, and she honestly should have that evaluated.  I think ER triage is appropriate.

## 2024-03-01 NOTE — Telephone Encounter (Signed)
 Copied from CRM [id number]. Topic: Clinical - Red Word Triage >> Mar 01, 2024 10:46 AM Harlene ORN wrote: Red Word that prompted transfer to Nurse Triage: Heart racing and night terrors due to her BP medication, Metropolol. Reason for Disposition  [1] Caller has URGENT medicine question about med that primary care doctor (or NP/PA) or specialist prescribed AND [2] triager unable to answer question  Pain also in shoulder(s) or arm(s) or jaw  (Exception: Pain is clearly made worse by movement.)  Answer Assessment - Initial Assessment Questions 1. NAME of MEDICINE: What medicine(s) are you calling about?     Metoprolol  is giving me night terrors and making my heart race.  I'm losing my memory.   When I researched my medication it should not be on the market.   I can't take it anymore.  Right now my heart is racing.   I need a beta blocker to calm my heart rate down.   I'm not taking the metoprolol  anymore.   2. QUESTION: What is your question? (e.g., double dose of medicine, side effect)     The night terrors are awful, I'm hallucinating, and it's getting worse.  I took a metoprolol   yesterday and so I had a night terror last night.   I screamed and screamed so bad.  I thought there were giant snakes in my room.   There was also a man monster walking around the room.   I was so scarred.  I can't hardly talk this morning from screaming so much last night. My husband has heart problems so this is really upsetting him me having the night terrors.  3. PRESCRIBER: Who prescribed the medicine? Reason: if prescribed by specialist, call should be referred to that group.     Dr. Avelina 4. SYMPTOMS: Do you have any symptoms? If Yes, ask: What symptoms are you having?  How bad are the symptoms (e.g., mild, moderate, severe)     At the end of the conversation she mentioned waking up this morning with what felt like a fist in the middle of my chest between my breasts.  It was a hard pressure.  I drank  some milk and went back to bed and slept finally after having the night terrors.  It's better now but I still have this feeling in my chest between my breasts.  I'm also feeling uncomfortable in my left arm and left armpit hurts.   At this point I referred her to the ED. She was agreeable to going and told her husband in the background she needed to go to the ED.   She asked if she could take one of her husband's atenolol pills to calm my racing heart.    I let her know she needed to go to the ED now.    She is going to [facility].    5. PREGNANCY:  Is there any chance that you are pregnant? When was your last menstrual period?     N/A due to age  Answer Assessment - Initial Assessment Questions 1. LOCATION: Where does it hurt?       See documentation under the medication problem protocol.   She mentioned chest pressure during triage for issue with her metoprolol .  She woke up earlier this morning with what felt like a fist in the middle of my chest between my breasts.  It was a heavy pressure.  I drank some milk and went back to bed and slept some after  having the night terrors all night.  She is still having the pressure in the middle of her chest between her breasts and also down her left arm and in left armpit.   It's not as bad as earlier but I still feel it.  I referred her to the ED.  She is c/o her heart racing also because she is off of the metoprolol  due to night terrors.   (Refer to medication problem protocol).  2. RADIATION: Does the pain go anywhere else? (e.g., into neck, jaw, arms, back)     Left arm and left armpit 3. ONSET: When did the chest pain begin? (Minutes, hours or days)      When she woke up earlier this morning. 4. PATTERN: Does the pain come and go, or has it been constant since it started?  Does it get worse with exertion?      It's constant.  It's not as bad as when I woke up the first time but it's still there now.    5. DURATION: How long does it last (e.g., seconds, minutes, hours)     Since earlier this morning. 6. SEVERITY: How bad is the pain?  (e.g., Scale 1-10; mild, moderate, or severe)     See above 7. CARDIAC RISK FACTORS: Do you have any history of heart problems or risk factors for heart disease? (e.g., angina, prior heart attack; diabetes, high blood pressure, high cholesterol, smoker, or strong family history of heart disease)     Was taking metoprolol .   Stopped taking it due to night terrors.   She asked if she could take one of her husband's atenolol pills to calm her racing heart.   I need to be on a beta blocker.   I've referred her to the ED which she and husband are agreeable to going. 8. PULMONARY RISK FACTORS: Do you have any history of lung disease?  (e.g., blood clots in lung, asthma, emphysema, birth control pills)     Not asked 9. CAUSE: What do you think is causing the chest pain?     I don't know.   My husband has a lot of heart issues and this sounds similar to what he has experienced. 10. OTHER SYMPTOMS: Do you have any other symptoms? (e.g., dizziness, nausea, vomiting, sweating, fever, difficulty breathing, cough)       See above and medical problem protocol 11. PREGNANCY: Is there any chance you are pregnant? When was your last menstrual period?       N/A due to age  Protocols used: Medication Question Call-A-AH, Chest Pain-A-AH FYI Only or Action Required?: FYI only for provider.  Patient was last seen in primary care on 01/12/2024 by Avelina Greig BRAVO, MD.  Called Nurse Triage reporting Medication Problem.  Metoprolol  giving her night terrors.   Bad terrors last night.  Screamed and screamed from hallucinations.   See triage notes for details.  Symptoms began today.  Chest pain now and earlier this morning.     Night terrors last night.  Interventions attempted: Prescription medications: stopped her metoprolol  due to night terrors..  Referred to ED for  chest pain  Symptoms are: rapidly worsening.  Triage Disposition: Call PCP Now  Patient/caregiver understands and will follow disposition?: YesFYI Only or Action Required?: FYI only for provider.  Patient was last seen in primary care on 01/12/2024 by Avelina Greig BRAVO, MD.  Called Nurse Triage reporting Medication Problem. Then she mentioned chest pain with radiation down left arm and  left armpit.   Referred to the ED.    Symptoms began today.  Interventions attempted: Prescription medications: Stopped her metoprolol  due to night terrors.  She then mentioned her heart racing and having chest pressure in the center of her chest between her breasts going down her left arm and pain in her left armpit.  Going to [facility]  Symptoms are: rapidly worsening.  Triage Disposition: Call PCP Now  Patient/caregiver understands and will follow disposition?: Yes

## 2024-03-02 DIAGNOSIS — Z961 Presence of intraocular lens: Secondary | ICD-10-CM | POA: Diagnosis not present

## 2024-03-02 DIAGNOSIS — H353132 Nonexudative age-related macular degeneration, bilateral, intermediate dry stage: Secondary | ICD-10-CM | POA: Diagnosis not present

## 2024-03-02 DIAGNOSIS — Z0100 Encounter for examination of eyes and vision without abnormal findings: Secondary | ICD-10-CM | POA: Diagnosis not present

## 2024-03-02 DIAGNOSIS — H401131 Primary open-angle glaucoma, bilateral, mild stage: Secondary | ICD-10-CM | POA: Diagnosis not present

## 2024-03-02 DIAGNOSIS — E119 Type 2 diabetes mellitus without complications: Secondary | ICD-10-CM | POA: Diagnosis not present

## 2024-03-07 ENCOUNTER — Telehealth: Payer: Self-pay | Admitting: *Deleted

## 2024-03-07 NOTE — Telephone Encounter (Signed)
 Left message for Mrs. Sautter that Dr. Avelina got a message from Erminio the heath nurse about the Metoprolol  and Tramadol  and Dr. Avelina said they could address this at her upcoming CPE but if she feels like this needs to be addressed sooner, then please call the office to scheduled and office visit.

## 2024-03-07 NOTE — Telephone Encounter (Signed)
-----   Message from Greig Ring sent at 03/06/2024  5:14 PM EDT -----  Call patient.Cathy Mullins let her know we can discuss at upcoming  CPE.Cathy Mullins if more acute need have her make an appointment to be seen. ----- Message ----- From: Rilla Baller, MD Sent: 03/01/2024  10:55 PM EDT To: Greig FORBES Ring, MD  Fyi from [facility] visit: Pt [name] done. Pt relays she has stopped taking the Metoprolol  and Tramadol . She is asking what can you give her to replace as it was giving her night terrors, memory issues, and insomnia. She has an appt next month for CPE.Thank you! Erminio

## 2024-03-19 DIAGNOSIS — H353221 Exudative age-related macular degeneration, left eye, with active choroidal neovascularization: Secondary | ICD-10-CM | POA: Diagnosis not present

## 2024-03-19 DIAGNOSIS — H401131 Primary open-angle glaucoma, bilateral, mild stage: Secondary | ICD-10-CM | POA: Diagnosis not present

## 2024-03-19 DIAGNOSIS — Z961 Presence of intraocular lens: Secondary | ICD-10-CM | POA: Diagnosis not present

## 2024-03-27 ENCOUNTER — Other Ambulatory Visit: Payer: Self-pay | Admitting: Family Medicine

## 2024-03-27 ENCOUNTER — Ambulatory Visit: Admitting: Family Medicine

## 2024-03-27 ENCOUNTER — Encounter: Payer: Self-pay | Admitting: Family Medicine

## 2024-03-27 VITALS — BP 118/71 | HR 65 | Temp 98.8°F | Ht 60.75 in | Wt 159.5 lb

## 2024-03-27 DIAGNOSIS — G720 Drug-induced myopathy: Secondary | ICD-10-CM | POA: Diagnosis not present

## 2024-03-27 DIAGNOSIS — E119 Type 2 diabetes mellitus without complications: Secondary | ICD-10-CM

## 2024-03-27 DIAGNOSIS — R413 Other amnesia: Secondary | ICD-10-CM

## 2024-03-27 DIAGNOSIS — J449 Chronic obstructive pulmonary disease, unspecified: Secondary | ICD-10-CM

## 2024-03-27 DIAGNOSIS — I152 Hypertension secondary to endocrine disorders: Secondary | ICD-10-CM

## 2024-03-27 DIAGNOSIS — E1169 Type 2 diabetes mellitus with other specified complication: Secondary | ICD-10-CM | POA: Diagnosis not present

## 2024-03-27 DIAGNOSIS — R8290 Unspecified abnormal findings in urine: Secondary | ICD-10-CM

## 2024-03-27 DIAGNOSIS — T466X5A Adverse effect of antihyperlipidemic and antiarteriosclerotic drugs, initial encounter: Secondary | ICD-10-CM

## 2024-03-27 DIAGNOSIS — N1831 Type 2 diabetes mellitus with diabetic chronic kidney disease: Secondary | ICD-10-CM

## 2024-03-27 DIAGNOSIS — E1122 Type 2 diabetes mellitus with diabetic chronic kidney disease: Secondary | ICD-10-CM | POA: Diagnosis not present

## 2024-03-27 DIAGNOSIS — Z0000 Encounter for general adult medical examination without abnormal findings: Secondary | ICD-10-CM

## 2024-03-27 DIAGNOSIS — E1159 Type 2 diabetes mellitus with other circulatory complications: Secondary | ICD-10-CM | POA: Diagnosis not present

## 2024-03-27 DIAGNOSIS — Z7984 Long term (current) use of oral hypoglycemic drugs: Secondary | ICD-10-CM

## 2024-03-27 DIAGNOSIS — N183 Chronic kidney disease, stage 3 unspecified: Secondary | ICD-10-CM

## 2024-03-27 LAB — POC URINALSYSI DIPSTICK (AUTOMATED)
Bilirubin, UA: NEGATIVE
Blood, UA: NEGATIVE
Glucose, UA: NEGATIVE
Ketones, UA: NEGATIVE
Leukocytes, UA: NEGATIVE
Nitrite, UA: NEGATIVE
Protein, UA: NEGATIVE
Spec Grav, UA: 1.015 (ref 1.010–1.025)
Urobilinogen, UA: 0.2 U/dL
pH, UA: 6 (ref 5.0–8.0)

## 2024-03-27 LAB — HM DIABETES FOOT EXAM

## 2024-03-27 NOTE — Assessment & Plan Note (Signed)
 Acute, may be med SE or due to concentrated urine.  Will check UA to rule out infection.

## 2024-03-27 NOTE — Assessment & Plan Note (Addendum)
 Concerns about dementia.  At last [place]:FFDZ 27/30... missed date and location, good executive functioning.  Clock drawing nml, animal recall 9 in 15 seconds.   Has appt upcoming with neuro. Lab eval unremarkable.  Discussed possibly stopping despramine (but has been on a longtme) could be causing SE, fatigue, memory etc.  Significant family history for Alzheimer's.

## 2024-03-27 NOTE — Assessment & Plan Note (Signed)
 Stable, chronic.   Not at goal.  On  no medication.. refused statins give SE in past. Does not want to take zetia or welchol .

## 2024-03-27 NOTE — Addendum Note (Signed)
 Addended by: WENDELL ARLAND RAMAN on: 03/27/2024 03:38 PM   Modules accepted: Orders

## 2024-03-27 NOTE — Assessment & Plan Note (Signed)
  Stable, chronic.  Continue current medication.  Lisinopril 10 mg daily, metoprolol tartrate 50 mg p.o. twice daily

## 2024-03-27 NOTE — Assessment & Plan Note (Signed)
Mild, stable control using albuterol as needed

## 2024-03-27 NOTE — Assessment & Plan Note (Addendum)
 Chronic, well-controlled on current regimen.  No benefit in pushing A1c down to 5.5.  She  tried decreasing Glucophage  XL to once daily but felt sugar was going back up significant;ly so restarted.

## 2024-03-27 NOTE — Progress Notes (Signed)
 Patient ID: Cathy Mullins, female    DOB: 05-May-1946, 78 y.o.   MRN: [id number]  This visit was conducted in person.  BP 118/71   Pulse 65   Temp 98.8 F (37.1 C) (Temporal)   Ht 5' 0.75 (1.543 m)   Wt 159 lb 8 oz (72.3 kg)   SpO2 97%   BMI 30.39 kg/m    Chief Complaint  Patient presents with   Annual Exam    /Part 2 ([facility] 02/29/2024)      Subjective:   HPI: Cathy Mullins is a 78 y.o. female presenting on 03/27/2024 for he patient presents for complete physical and review of chronic health problems. He/She also has the following acute concerns today: fatigue  Trouble sleeping been ongoing for a while.SABRA getting worse. Has night terrors.  Napping off and on during the day  Has upcoming OV with neurologist this next week..  Continued memory issues.   Has fibromyalgia: Stable moderate control on  despipramine. Limiting tramadol .   Urine odor strong like fish x months... no dysuria, no  urgency.  The patient saw a LPN or RN for medicare wellness visit. 03/01/2023  Prevention and wellness was reviewed in detail. Note reviewed and important notes copied below.  Diabetes: Chronic well-controlled on metformin  XR 500 mg twice daily Lab Results  Component Value Date   HGBA1C 5.5 01/12/2024  Associated with chronic kidney disease. Using medications without difficulties: Hypoglycemic episodes: none Hyperglycemic episodes: none Feet problems: none Blood Sugars averaging: fbs 89-100 eye exam within last year: yes  Hypertension:  Excellent control on Lisinopril  10 mg p.o. daily Metoprolol  tartrate 50 mg p.o. twice daily BP Readings from Last 3 Encounters:  03/27/24 118/71  01/12/24 120/72  03/02/23 102/62  Using medication without problems or lightheadedness:  none Chest pain with exertion: none Edema: none Prophete of breath:  intermittent Average home BPs: 130/70 Other issues:  Elevated Cholesterol:  Improved control  but not at goal on  no medication.. refused statins  give SE in past. Does not want to take zetia or welchol . Lab Results  Component Value Date   CHOL 277 (H) 01/12/2024   HDL 61.30 01/12/2024   LDLCALC 166 (H) 01/12/2024   LDLDIRECT 157.0 02/23/2023   TRIG 248.0 (H) 01/12/2024   CHOLHDL 5 01/12/2024  Using medications without problems: Muscle aches:  Diet compliance: more salads. Exercise: limited given chronic back pain/fibromyalgia.SABRA using tramadol  50 mg Fourtimes daily. Helps her sleep  Other complaints:  COPD: Mild, stable control using albuterol  as needed      Relevant past medical, surgical, family and social history reviewed and updated as indicated. Interim medical history since our last visit reviewed. Allergies and medications reviewed and updated. Outpatient Medications Prior to Visit  Medication Sig Dispense Refill   brimonidine (ALPHAGAN) 0.2 % ophthalmic solution Place 1 drop into both eyes in the morning and at bedtime.     Accu-Chek Softclix Lancets lancets TEST BLOOD SUGAR TWICE DAILY 200 each 3   Alcohol Swabs (DROPSAFE ALCOHOL PREP) 70 % PADS USE TWO TIMES DAILY 200 each 3   Blood Glucose Calibration (ACCU-CHEK AVIVA) SOLN Use to check controls on glucose meter 1 each 3   Blood Glucose Monitoring Suppl (ACCU-CHEK AVIVA PLUS) w/Device KIT Use to check blood sugar two times a day 1 kit 0   desipramine  (NORPRAMIN ) 25 MG tablet Take 1 tablet (25 mg total) by mouth at bedtime. 90 tablet 1   glucose blood (ACCU-CHEK AVIVA PLUS) test  strip TEST BLOOD SUGAR TWICE DAILY 200 strip 0   hydrocortisone  (ANUSOL -HC) 2.5 % rectal cream Place 1 Application rectally 2 (two) times daily. 30 g 1   latanoprost  (XALATAN ) 0.005 % ophthalmic solution Place 1 drop into both eyes at bedtime.     lisinopril  (ZESTRIL ) 10 MG tablet TAKE 1 TABLET EVERY DAY 90 tablet 0   metFORMIN  (GLUCOPHAGE -XR) 500 MG 24 hr tablet TAKE 1 TABLETS EVERY MORNING AND TAKE 1 TABLET AT NIGHT 270 tablet 3   Multiple Vitamins-Minerals (PRESERVISION AREDS PO) Take by  mouth.     timolol  (TIMOPTIC ) 0.5 % ophthalmic solution Place 1 drop into both eyes daily.      Facility-Administered Medications Prior to Visit  Medication Dose Route Frequency Provider Last Rate Last Admin   betamethasone  acetate-betamethasone  sodium phosphate (CELESTONE ) injection 3 mg  3 mg Intramuscular Once Janit Thresa HERO, DPM         Per HPI unless specifically indicated in ROS section below Review of Systems  Constitutional:  Negative for fatigue and fever.  HENT:  Negative for congestion.   Eyes:  Negative for pain.  Respiratory:  Negative for cough and shortness of breath.   Cardiovascular:  Negative for chest pain, palpitations and leg swelling.  Gastrointestinal:  Negative for abdominal pain.  Genitourinary:  Negative for dysuria and vaginal bleeding.  Musculoskeletal:  Negative for back pain.  Neurological:  Negative for syncope, light-headedness and headaches.  Psychiatric/Behavioral:  Negative for dysphoric mood.    Objective:  BP 118/71   Pulse 65   Temp 98.8 F (37.1 C) (Temporal)   Ht 5' 0.75 (1.543 m)   Wt 159 lb 8 oz (72.3 kg)   SpO2 97%   BMI 30.39 kg/m   Wt Readings from Last 3 Encounters:  03/27/24 159 lb 8 oz (72.3 kg)  02/29/24 163 lb (73.9 kg)  01/12/24 163 lb 4 oz (74 kg)      Physical Exam Vitals and nursing note reviewed.  Constitutional:      General: She is not in acute distress.    Appearance: Normal appearance. She is well-developed. She is not ill-appearing or toxic-appearing.  HENT:     Head: Normocephalic.     Right Ear: Hearing, tympanic membrane, ear canal and external ear normal.     Left Ear: Hearing, tympanic membrane, ear canal and external ear normal.     Nose: Nose normal.  Eyes:     General: Lids are normal. Lids are everted, no foreign bodies appreciated.     Conjunctiva/sclera: Conjunctivae normal.     Pupils: Pupils are equal, round, and reactive to light.  Neck:     Thyroid : No thyroid  mass or thyromegaly.      Vascular: No carotid bruit.     Trachea: Trachea normal.  Cardiovascular:     Rate and Rhythm: Normal rate and regular rhythm.     Heart sounds: Normal heart sounds, S1 normal and S2 normal. No murmur heard.    No gallop.  Pulmonary:     Effort: Pulmonary effort is normal. No respiratory distress.     Breath sounds: Normal breath sounds. No wheezing, rhonchi or rales.  Abdominal:     General: Bowel sounds are normal. There is no distension or abdominal bruit.     Palpations: Abdomen is soft. There is no fluid wave or mass.     Tenderness: There is no abdominal tenderness. There is no guarding or rebound.     Hernia: No hernia is present.  Musculoskeletal:     Cervical back: Normal range of motion and neck supple.  Lymphadenopathy:     Cervical: No cervical adenopathy.  Skin:    General: Skin is warm and dry.     Findings: No rash.  Neurological:     Mental Status: She is alert.     Cranial Nerves: No cranial nerve deficit.     Sensory: No sensory deficit.  Psychiatric:        Mood and Affect: Mood is not anxious or depressed.        Speech: Speech normal.        Behavior: Behavior normal. Behavior is cooperative.        Judgment: Judgment normal.    Diabetic foot exam: Normal inspection No skin breakdown No calluses  Normal DP pulses mild decreased sensation to light touch and monofilament Nails normal     Results for orders placed or performed in visit on 03/27/24  HM DIABETES FOOT EXAM   Collection Time: 03/27/24 12:00 AM  Result Value Ref Range   HM Diabetic Foot Exam done      COVID 19 screen:  No recent travel or known exposure to COVID19 The patient denies respiratory symptoms of COVID 19 at this time. The importance of social distancing was discussed today.   Assessment and Plan The patient's preventative maintenance and recommended screening tests for an annual wellness exam were reviewed in full today. Brought up to date unless services  declined.  Counselled on the importance of diet, exercise, and its role in overall health and mortality. The patient's FH and SH was reviewed, including their home life, tobacco status, and drug and alcohol status.   DEXA: 12/2015 nml, repeat in 5 years.. she is not interested in bone density Mammo: nml 2023, scheduled, mother with breast cancer PAP/DVE: not indicated s/p abdominal hysterectomy, right ovary remaining. Sees Dr. Fredirick for GYN for lichen sclerosis but has not seen in a while. No family history of ovarian cancer. She has chosen to stop DVEs.   Vaccines:Up to date with PNA 13, 23 and planning shingles vaccine, tdap due.   Has received COVID vaccine Colon cancer screening: colonoscopy  Dr Jinny Nml 06/2021 Hep C:  done Nonsmoker  Urine microalbumin:  uptodate  Problem List Items Addressed This Visit     Abnormal urine odor    Acute, may be med SE or due to concentrated urine.  Will check UA to rule out infection.      CKD stage 3 due to type 2 diabetes mellitus (HCC)   Chronic, due to diabetes Stable control       COPD without exacerbation (HCC)   Mild, stable control using albuterol  as needed      DM (diabetes mellitus), type 2 with renal complications (HCC)   Chronic, well-controlled on current regimen.  No benefit in pushing A1c down to 5.5.  She  tried decreasing Glucophage  XL to once daily but felt sugar was going back up significant;ly so restarted.      High calcium levels    MAy be due to supplement... has stopped  now. Has high milk intake.a      Hyperlipidemia associated with type 2 diabetes mellitus (HCC)   Stable, chronic.   Not at goal.  On  no medication.. refused statins give SE in past. Does not want to take zetia or welchol .       Hypertension associated with diabetes (HCC)   Stable, chronic.  Continue current medication.  Lisinopril  10 mg daily, metoprolol  tartrate 50 mg p.o. twice daily      Memory loss    Concerns about dementia.  At  last :FFDZ 27/30... missed date and location, good executive functioning.  Clock drawing nml, animal recall 9 in 15 seconds.   Has appt upcoming with neuro. Lab eval unremarkable.  Discussed possibly stopping despramine (but has been on a longtme) could be causing SE, fatigue, memory etc.  Significant family history for Alzheimer's.      Statin myopathy   Intolerant of statins in the past.      Other Visit Diagnoses       Routine general medical examination at a health care facility    -  Primary     Diabetes mellitus treated with oral medication (HCC)              Greig Ring, MD

## 2024-03-27 NOTE — Assessment & Plan Note (Signed)
 MAy be due to supplement... has stopped  now. Has high milk intake.a

## 2024-03-27 NOTE — Assessment & Plan Note (Signed)
Intolerant of statins in the past.

## 2024-03-27 NOTE — Assessment & Plan Note (Signed)
Chronic, due to diabetes Stable control

## 2024-03-27 NOTE — Patient Instructions (Signed)
Set up yearly eye exam for diabetes and have the opthalmologist send [place] a copy of the evaluation for the chart.

## 2024-03-28 ENCOUNTER — Other Ambulatory Visit: Payer: Self-pay | Admitting: Family Medicine

## 2024-03-30 DIAGNOSIS — H353221 Exudative age-related macular degeneration, left eye, with active choroidal neovascularization: Secondary | ICD-10-CM | POA: Diagnosis not present

## 2024-03-30 DIAGNOSIS — E119 Type 2 diabetes mellitus without complications: Secondary | ICD-10-CM | POA: Diagnosis not present

## 2024-03-30 DIAGNOSIS — H401131 Primary open-angle glaucoma, bilateral, mild stage: Secondary | ICD-10-CM | POA: Diagnosis not present

## 2024-03-30 DIAGNOSIS — Z961 Presence of intraocular lens: Secondary | ICD-10-CM | POA: Diagnosis not present

## 2024-03-31 ENCOUNTER — Encounter
Admission: EM | Disposition: A | Discharge status: Home / Self Care | Payer: Self-pay | Source: Home / Self Care | Attending: Emergency Medicine

## 2024-03-31 ENCOUNTER — Other Ambulatory Visit: Payer: Self-pay

## 2024-03-31 ENCOUNTER — Emergency Department
Admission: EM | Admit: 2024-03-31 | Discharge: 2024-03-31 | Disposition: A | Discharge status: Home / Self Care | Attending: Emergency Medicine | Admitting: Emergency Medicine

## 2024-03-31 ENCOUNTER — Emergency Department

## 2024-03-31 DIAGNOSIS — E119 Type 2 diabetes mellitus without complications: Secondary | ICD-10-CM | POA: Diagnosis not present

## 2024-03-31 DIAGNOSIS — N183 Chronic kidney disease, stage 3 unspecified: Secondary | ICD-10-CM

## 2024-03-31 DIAGNOSIS — E785 Hyperlipidemia, unspecified: Secondary | ICD-10-CM | POA: Diagnosis not present

## 2024-03-31 DIAGNOSIS — E871 Hypo-osmolality and hyponatremia: Secondary | ICD-10-CM | POA: Insufficient documentation

## 2024-03-31 DIAGNOSIS — E1169 Type 2 diabetes mellitus with other specified complication: Secondary | ICD-10-CM

## 2024-03-31 DIAGNOSIS — R0789 Other chest pain: Secondary | ICD-10-CM | POA: Diagnosis not present

## 2024-03-31 DIAGNOSIS — I10 Essential (primary) hypertension: Secondary | ICD-10-CM | POA: Diagnosis not present

## 2024-03-31 DIAGNOSIS — I214 Non-ST elevation (NSTEMI) myocardial infarction: Secondary | ICD-10-CM

## 2024-03-31 DIAGNOSIS — R231 Pallor: Secondary | ICD-10-CM | POA: Diagnosis not present

## 2024-03-31 DIAGNOSIS — I959 Hypotension, unspecified: Secondary | ICD-10-CM | POA: Diagnosis not present

## 2024-03-31 DIAGNOSIS — I700 Atherosclerosis of aorta: Secondary | ICD-10-CM | POA: Diagnosis not present

## 2024-03-31 LAB — CBC
HCT: 35 % — ABNORMAL LOW (ref 36.0–46.0)
Hemoglobin: 11.9 g/dL — ABNORMAL LOW (ref 12.0–15.0)
MCH: 30 pg (ref 26.0–34.0)
MCHC: 34 g/dL (ref 30.0–36.0)
MCV: 88.2 fL (ref 80.0–100.0)
Platelets: 337 K/uL (ref 150–400)
RBC: 3.97 MIL/uL (ref 3.87–5.11)
RDW: 13.2 % (ref 11.5–15.5)
WBC: 9.1 K/uL (ref 4.0–10.5)
nRBC: 0 % (ref 0.0–0.2)

## 2024-03-31 LAB — COMPREHENSIVE METABOLIC PANEL WITH GFR
ALT: 13 U/L (ref 0–44)
AST: 26 U/L (ref 15–41)
Albumin: 3.2 g/dL — ABNORMAL LOW (ref 3.5–5.0)
Alkaline Phosphatase: 43 U/L (ref 38–126)
Anion gap: 9 (ref 5–15)
BUN: 23 mg/dL (ref 8–23)
CO2: 22 mmol/L (ref 22–32)
Calcium: 9 mg/dL (ref 8.9–10.3)
Chloride: 102 mmol/L (ref 98–111)
Creatinine, Ser: 1.22 mg/dL — ABNORMAL HIGH (ref 0.44–1.00)
GFR, Estimated: 46 mL/min — ABNORMAL LOW (ref >60)
Glucose, Bld: 168 mg/dL — ABNORMAL HIGH (ref 70–99)
Potassium: 5 mmol/L (ref 3.5–5.1)
Sodium: 133 mmol/L — ABNORMAL LOW (ref 135–145)
Total Bilirubin: 0.6 mg/dL (ref 0.0–1.2)
Total Protein: 6.1 g/dL — ABNORMAL LOW (ref 6.5–8.1)

## 2024-03-31 LAB — LIPID PANEL
Cholesterol: 203 mg/dL — ABNORMAL HIGH (ref 0–200)
HDL: 53 mg/dL (ref >40)
LDL Cholesterol: 127 mg/dL — ABNORMAL HIGH (ref 0–99)
Total CHOL/HDL Ratio: 3.8 ratio
Triglycerides: 113 mg/dL (ref <150)
VLDL: 23 mg/dL (ref 0–40)

## 2024-03-31 LAB — TROPONIN I (HIGH SENSITIVITY)
Troponin I (High Sensitivity): 3 ng/L (ref <18)
Troponin I (High Sensitivity): 3 ng/L (ref <18)

## 2024-03-31 SURGERY — CORONARY/GRAFT ACUTE MI REVASCULARIZATION
Anesthesia: Moderate Sedation

## 2024-03-31 MED ORDER — ASPIRIN 81 MG PO CHEW: 81.0000 mg | CHEWABLE_TABLET | Freq: Every day | ORAL | Status: DC

## 2024-03-31 NOTE — ED Provider Notes (Signed)
-----------------------------------------   5:24 PM on 03/31/2024 -----------------------------------------  I took over care of this patient from Dr. Arlander.  Per the signout received from Dr. Arlander, the plan was to await a repeat troponin and if the patient remained asymptomatic she would be discharged home.  the repeat troponin remains negative.  On reassessment, the patient's vital signs are stable.  She remains asymptomatic and is feeling fine.  She would like to go home.  I reviewed Dr. Ulysses consult note.  He had mentioned the possibility of obtaining an echocardiogram and/or a stress test and possibly admitting the patient.  I called and discussed the case with him.  He advised that his plan had been to likely admit the patient based on her initial presentation.  Based on this, I did consider whether the patient may benefit from inpatient admission.  I discussed the results of the workup with the patient as well as the possible plan of care a further workup in the hospital, versus the possibility of close outpatient follow-up as had initially been planned.  I did offer admission to the patient.  The patient expressed a strong preference to go home.  Given the negative workup and resolved symptoms I feel that this is reasonable.  She will call Dr. Gollan on Monday to arrange for close follow-up.  I counseled her extensively and gave strict return precautions; she expressed understanding and agreement.   Cathy Pae, MD 03/31/24 1728

## 2024-03-31 NOTE — ED Notes (Signed)
 Cardiologist at bedside upon pt arrival to ED rm at 1337

## 2024-03-31 NOTE — ED Provider Notes (Signed)
 [facility] Provider Note    Event Date/Time   First MD Initiated Contact with Patient 03/31/24 1346     (approximate)   History   Chest Pain   HPI  Cathy Mullins is a 78 y.o. female with a history of diabetes, esophagitis, fibromyalgia, hypertension who presents after an episode of chest pain with weakness and lightheadedness.  EMS reports her blood pressure was quite low, was activated as a STEMI.  Seen by Dr. Mady of cardiology upon arrival, patient reports that she has had intermittent episodes similar to this in the past and cardiology has not been able to determine a reason, she denies a history of CAD       Physical Exam   Triage Vital Signs: ED Triage Vitals  Encounter Vitals Group     BP 03/31/24 1343 (!) 115/56     Girls Systolic BP Percentile --      Girls Diastolic BP Percentile --      Boys Systolic BP Percentile --      Boys Diastolic BP Percentile --      Pulse Rate 03/31/24 1343 73     Resp 03/31/24 1343 16     Temp 03/31/24 1343 (!) 97.4 F (36.3 C)     Temp Source 03/31/24 1343 Oral     SpO2 03/31/24 1343 100 %     Weight 03/31/24 1344 75.5 kg (166 lb 7.2 oz)     Height 03/31/24 1344 1.524 m (5')     Head Circumference --      Peak Flow --      Pain Score 03/31/24 1344 0     Pain Loc --      Pain Education --      Exclude from Growth Chart --     Most recent vital signs: Vitals:   03/31/24 1400 03/31/24 1430  BP: (!) 116/53 130/60  Pulse: (!) 58 65  Resp: 14 17  Temp:    SpO2: 100% 100%     General: Awake, no distress.  CV:  Good peripheral perfusion.  Regular rate and rhythm Resp:  Normal effort.  Clear to auscultation bilaterally Abd:  No distention.  Other:     ED Results / Procedures / Treatments   Labs (all labs ordered are listed, but only abnormal results are displayed) Labs Reviewed  CBC - Abnormal; Notable for the following components:      Result Value   Hemoglobin 11.9 (*)    HCT 35.0 (*)     All other components within normal limits  COMPREHENSIVE METABOLIC PANEL WITH GFR - Abnormal; Notable for the following components:   Sodium 133 (*)    Glucose, Bld 168 (*)    Creatinine, Ser 1.22 (*)    Total Protein 6.1 (*)    Albumin 3.2 (*)    GFR, Estimated 46 (*)    All other components within normal limits  LIPID PANEL - Abnormal; Notable for the following components:   Cholesterol 203 (*)    LDL Cholesterol 127 (*)    All other components within normal limits  TROPONIN I (HIGH SENSITIVITY)  TROPONIN I (HIGH SENSITIVITY)     EKG  ED ECG REPORT I, Lamar Price, the attending physician, personally viewed and interpreted this ECG.  Date: 03/31/2024  Rhythm: normal sinus rhythm QRS Axis: normal Intervals: normal ST/T Wave abnormalities: normal Narrative Interpretation: no evidence of acute ischemia    RADIOLOGY Chest x-ray viewed interpret by me, no  acute abnormality    PROCEDURES:  Critical Care performed:   Procedures   MEDICATIONS ORDERED IN ED: Medications  aspirin  chewable tablet 81 mg (has no administration in time range)     IMPRESSION / MDM / ASSESSMENT AND PLAN / ED COURSE  I reviewed the triage vital signs and the nursing notes. Patient's presentation is most consistent with severe exacerbation of chronic illness.  Patient presents with an episode of chest pain as detailed above, she describes it as a pressure sensation.  Blood pressure noted to be hypotensive by EMS but improved to in the emergency department, she is feeling better.  Greeted by Dr. Mady of cardiology who decided no indication for emergent catheterization at this time   ----------------------------------------- 3:12 PM on 03/31/2024 -----------------------------------------   Labs are reassuring, high sensitive troponin is normal, she remains asymptomatic, have asked my colleague to follow-up on second troponin, if normal anticipate discharge with close follow-up with Dr.  Perla      FINAL CLINICAL IMPRESSION(S) / ED DIAGNOSES   Final diagnoses:  Atypical chest pain     Rx / DC Orders   ED Discharge Orders     None        Note:  This document was prepared using Dragon voice recognition software and may include unintentional dictation errors.   Arlander [place], MD 03/31/24 [phone]

## 2024-03-31 NOTE — Consult Note (Signed)
 Cardiology Consultation   Patient ID: Cathy Mullins MRN: [id number]; DOB: 12-26-1945  Admit date: 03/31/2024 Date of Consult: 03/31/2024  PCP:  Avelina Greig BRAVO, MD   [facility] Providers Cardiologist:  Dr. Perla (last seen in 2013)     Patient Profile: Cathy Mullins is a 78 y.o. female with a hx of mitral valve prolapse, TIA, hypertension, hyperlipidemia, type 2 diabetes mellitus, fibromyalgia, obstructive sleep apnea, and chronic kidney disease, who is being seen 03/31/2024 for the evaluation of chest pain and abnormal EKG at the request of Dr. Arlander.  History of Present Illness: [name] was in her usual state of health and suddenly developed chest pressure around noon today.  She felt like she was going to die.  She has had intermittent pressure similar to this for many years (dating back to when she saw Dr. Gollan in 2013), albeit never this severe.  It was accompanied by headache, shortness of breath, and nausea.  The pain also radiated to her back.  She checked her blood pressure and found it to be 57/30, prompting her to call 911.  She was still in pain when EMS arrived, though her blood pressure had improved to around the 100/60.  She received 324 mg of aspirin  and normal saline bolus.  EKG by EMS showed 1 mm inferolateral ST elevation.  However, by the time the patient arrived in the ER, her chest pain and other symptoms had completely resolved.  She now states that she feels like I can go dancing.  Repeat EKG in the emergency department shows near complete resolution of inferolateral ST elevation, with only nonspecific ST segment changes present.   Past Medical History:  Diagnosis Date   Allergy    Asthma with COPD (HCC)    Complication of anesthesia    Difficulty waking after Spinal injection (2006)   Depression    daughter was very sick, depression began during this period of time    Diabetes mellitus    Esophagitis    Fibromyalgia    GERD (gastroesophageal  reflux disease)    Glaucoma    History of colon polyps    HTN (hypertension)    Hyperlipidemia    Lichen sclerosus    Migraine    Perforating folliculitis    Spinal stenosis    Tachycardia    TIA (transient ischemic attack)     Past Surgical History:  Procedure Laterality Date   ABDOMINAL HYSTERECTOMY     right ovary remaining   APPENDECTOMY     BREAST CYST ASPIRATION Bilateral    BREAST EXCISIONAL BIOPSY Right 1982   NEG   BREAST SURGERY     right(benign)   CARPAL TUNNEL RELEASE     CESAREAN SECTION     COLONOSCOPY WITH PROPOFOL  N/A 06/29/2021   Procedure: COLONOSCOPY WITH PROPOFOL ;  Surgeon: Cathy Carmine, MD;  Location: [facility];  Service: Endoscopy;  Laterality: N/A;  Diabetic   EPIDURAL BLOCK INJECTION  2013   ESOPHAGOGASTRODUODENOSCOPY (EGD) WITH PROPOFOL  N/A 06/29/2021   Procedure: ESOPHAGOGASTRODUODENOSCOPY (EGD) WITH PROPOFOL ;  Surgeon: Cathy Carmine, MD;  Location: [facility];  Service: Endoscopy;  Laterality: N/A;   KNEE ARTHROSCOPY W/ MENISCAL REPAIR     left   LIPOMA EXCISION     2 times left leg   lumpectomy     TONSILLECTOMY     TRIGGER FINGER RELEASE       Home Medications:  Prior to Admission medications   Medication Sig Start Date Cathy Mullins  Date Taking? Authorizing Provider  Accu-Chek Softclix Lancets lancets TEST BLOOD SUGAR TWICE DAILY 12/27/21   Bedsole, Amy E, MD  Alcohol Swabs (DROPSAFE ALCOHOL PREP) 70 % PADS USE TWO TIMES DAILY 12/26/23   Bedsole, Amy E, MD  Blood Glucose Calibration (ACCU-CHEK AVIVA) SOLN Use to check controls on glucose meter 08/20/20   Bedsole, Amy E, MD  Blood Glucose Monitoring Suppl (ACCU-CHEK AVIVA PLUS) w/Device KIT Use to check blood sugar two times a day 08/20/20   Bedsole, Amy E, MD  brimonidine (ALPHAGAN) 0.2 % ophthalmic solution Place 1 drop into both eyes in the morning and at bedtime.    [provider]  desipramine  (NORPRAMIN ) 25 MG tablet Take 1 tablet (25 mg total) by mouth at bedtime. 01/12/24    Bedsole, Amy E, MD  glucose blood (ACCU-CHEK AVIVA PLUS) test strip TEST BLOOD SUGAR TWICE DAILY 01/16/24   Bedsole, Amy E, MD  hydrocortisone  (ANUSOL -HC) 2.5 % rectal cream Place 1 Application rectally 2 (two) times daily. 03/20/22   Unk Corinn Skiff, MD  latanoprost  (XALATAN ) 0.005 % ophthalmic solution Place 1 drop into both eyes at bedtime.    [provider]  lisinopril  (ZESTRIL ) 10 MG tablet TAKE 1 TABLET EVERY DAY 03/28/24   Bedsole, Amy E, MD  metFORMIN  (GLUCOPHAGE -XR) 500 MG 24 hr tablet Take 1 tablet (500 mg total) by mouth 2 (two) times daily with a meal. 03/28/24   Bedsole, Amy E, MD  Multiple Vitamins-Minerals (PRESERVISION AREDS PO) Take by mouth.    [provider]    Scheduled Meds:  betamethasone  acetate-betamethasone  sodium phosphate  3 mg Intramuscular Once   Continuous Infusions:  PRN Meds:   Allergies:    Allergies  Allergen Reactions   Amoxicillin-Pot Clavulanate    Codeine     REACTION: Gives her migraines   Diclofenac  Other (See Comments)    Patient states it made her hallucinate   Hydrocodone-Acetaminophen     Lovastatin     REACTION: myalgias   Soma [Carisoprodol] Other (See Comments)    Confussion   Sulfonamide Derivatives     REACTION: Rash   Tizanidine     hallucination   Sulfa Antibiotics Rash    REACTION: Rash   Tape Rash    Social History:   Social History   Tobacco Use   Smoking status: Former    Current packs/day: 0.00    Types: Cigarettes    Quit date: 09/27/1988    Years since quitting: 35.5   Smokeless tobacco: Never  Vaping Use   Vaping status: Never Used  Substance Use Topics   Alcohol use: No    Alcohol/week: 0.0 standard drinks of alcohol   Drug use: No     Family History:   Family History  Problem Relation Age of Onset   Cancer Mother        breast   Stroke Mother    Valvular heart disease Mother    Breast cancer Mother 60   Alzheimer's disease Mother    Kidney disease Father    Stroke Father     Coronary artery disease Father    Parkinson's disease Father    Diabetes Brother    Alzheimer's disease Maternal Grandmother    Diabetes Maternal Aunt    Alzheimer's disease Maternal Aunt    Alzheimer's disease Maternal Aunt    Colon cancer Neg Hx    Pancreatic cancer Neg Hx    Stomach cancer Neg Hx      ROS:  Please see the  history of present illness. All other ROS reviewed and negative.     Physical Exam/Data: Vitals:   03/31/24 1343 03/31/24 1344  BP: (!) 115/56   Pulse: 73   Resp: 16   Temp: (!) 97.4 F (36.3 C)   TempSrc: Oral   SpO2: 100%   Weight:  75.5 kg  Height:  5' (1.524 m)   No intake or output data in the 24 hours ending 03/31/24 1359    03/31/2024    1:44 PM 03/27/2024    2:34 PM 02/29/2024    1:43 PM  Last 3 Weights  Weight (lbs) 166 lb 7.2 oz 159 lb 8 oz 163 lb  Weight (kg) 75.5 kg 72.349 kg 73.936 kg     Body mass index is 32.51 kg/m.  General:  Well nourished, well developed, in no acute distress. HEENT: normal Neck: no JVD Vascular: No carotid bruits; Distal pulses 2+ bilaterally Cardiac: Regular rate and rhythm without murmurs, rubs, or gallops. Lungs:  clear to auscultation bilaterally, no wheezing, rhonchi or rales  Abd: soft, nontender, no hepatomegaly  Ext: No edema.  1+ radial pulses bilaterally. Musculoskeletal:  No deformities, BUE and BLE strength normal and equal Skin: warm and dry  Neuro:  CNs 2-12 intact, no focal abnormalities noted Psych:  Normal affect   EKG:  The EKG was personally reviewed and demonstrates: Normal sinus rhythm with nonspecific ST segment elevation.  Inferolateral ST segment elevation noted on EMS EKGs no longer present.  Tracing appears very similar to prior EKG from 05/05/2022. Telemetry:  Telemetry was personally reviewed and demonstrates: Sinus rhythm.  Relevant CV Studies: None  Laboratory Data: High Sensitivity Troponin:  No results for input(s): TROPONINIHS in the last 720 hours.   ChemistryNo  results for input(s): NA, K, CL, CO2, GLUCOSE, BUN, CREATININE, CALCIUM, MG, GFRNONAA, GFRAA, ANIONGAP in the last 168 hours.  No results for input(s): PROT, ALBUMIN, AST, ALT, ALKPHOS, BILITOT in the last 168 hours. Lipids No results for input(s): CHOL, TRIG, HDL, LABVLDL, LDLCALC, CHOLHDL in the last 168 hours.  HematologyNo results for input(s): WBC, RBC, HGB, HCT, MCV, MCH, MCHC, RDW, PLT in the last 168 hours. Thyroid  No results for input(s): TSH, FREET4 in the last 168 hours.  BNPNo results for input(s): BNP, PROBNP in the last 168 hours.  DDimer No results for input(s): DDIMER in the last 168 hours.  Radiology/Studies:  No results found.   Assessment and Plan: Chest pain and abnormal EKG consistent with non-ST elevation ACS: Ms. Fuerst presents with sudden onset of chest pain rating to the back accompanied by shortness of breath, nausea, and headache and reported severe hypotension at home.  EMS EKG concerning for inferolateral ST elevation.  However, by the time the patient arrived in the ED, her symptoms had completely resolved, blood pressure normalized, and EKG returned back to baseline.  EKG in the ED does not meet STEMI criteria.  She is at risk for ischemic heart disease given her numerous cardiac risk factors including hypertension, hyperlipidemia (untreated due to myalgias with statin therapy in the past), diabetes mellitus, TIA, obesity, prior tobacco use, and age.  However, given that she is asymptomatic without EKG meeting STEMI criteria at this time, I think would be best to evaluate her further in the ED and defer emergent cardiac catheterization.  Low threshold to initiate heparin infusion if there is no evidence of severe anemia or other contraindication to anticoagulation, particularly if troponin is elevated.  Obtain echocardiogram.  Based on clinical course,  will need to consider catheterization versus  myocardial perfusion stress test on Monday.  Continue aspirin  81 mg daily.  Consider retrialing with alternative statin.  Hypotension: This resolved with 500 mL normal saline bolus by EMS.  Recommend holding lisinopril  as blood pressure remains on the lower [place]on K[place]man of normal.  Obtain echocardiogram and routine labs including CBC and CMP.  Chronic kidney disease stage 3: Most recent creatinine was 1.4 on 01/12/2024, stable since prior check in our system in 02/2023.  Avoid nephrotoxic agents if possible.  Consider gentle hydration, particularly of IV contrast as needed for catheterization or CT.  Hyperlipidemia associated with type 2 diabetes mellitus: Patient not on a statin at this time due to history of myalgias with lovastatin.  Consider alternative statin for secondary prevention in the setting of prior TIA and suspected ischemic heart disease.  If she is completely intolerant of statins, PCSK9 inhibitor therapy as an outpatient will need to be considered.  Risk Assessment/Risk Scores:    TIMI Risk Score for Unstable Angina or Non-ST Elevation MI:   The patient's TIMI risk score is 3, which indicates a 13% risk of all cause mortality, new or recurrent myocardial infarction or need for urgent revascularization in the next 14 days.   For questions or updates, please contact [facility] Please consult [email or web address] for contact info under [facility] Cardiology.  Signed, Lonni Hanson, MD  03/31/2024 1:59 PM

## 2024-03-31 NOTE — ED Triage Notes (Signed)
 Pt arrived via EMS from home d/t chest pressure that started around noon. Pt arrived code stemi with Dr. Arlander and cardiology at bedside upon arrival at 1337. Pt is A&O x4 with stable VS. Repeat EKG done at 1340. Pt given 324mg  of aspirin  via EMS prior to arrival

## 2024-03-31 NOTE — Discharge Instructions (Signed)
 Call Dr. Tresia office on Monday to arrange for close follow-up.  Return to the ER immediately for new, worsening, recurrent chest pain, difficulty breathing, weakness or lightheadedness, low blood pressure, or any other new or worsening symptoms that concern you.

## 2024-04-10 ENCOUNTER — Ambulatory Visit: Attending: Cardiology | Admitting: Physician Assistant

## 2024-04-10 ENCOUNTER — Encounter: Payer: Self-pay | Admitting: Cardiology

## 2024-04-10 VITALS — BP 159/68 | HR 56 | Ht 62.0 in | Wt 162.4 lb

## 2024-04-10 DIAGNOSIS — N183 Chronic kidney disease, stage 3 unspecified: Secondary | ICD-10-CM | POA: Diagnosis not present

## 2024-04-10 DIAGNOSIS — R9431 Abnormal electrocardiogram [ECG] [EKG]: Secondary | ICD-10-CM | POA: Diagnosis not present

## 2024-04-10 DIAGNOSIS — E785 Hyperlipidemia, unspecified: Secondary | ICD-10-CM | POA: Diagnosis not present

## 2024-04-10 DIAGNOSIS — E1122 Type 2 diabetes mellitus with diabetic chronic kidney disease: Secondary | ICD-10-CM | POA: Diagnosis not present

## 2024-04-10 DIAGNOSIS — E1169 Type 2 diabetes mellitus with other specified complication: Secondary | ICD-10-CM

## 2024-04-10 DIAGNOSIS — R072 Precordial pain: Secondary | ICD-10-CM

## 2024-04-10 DIAGNOSIS — R0789 Other chest pain: Secondary | ICD-10-CM | POA: Diagnosis not present

## 2024-04-10 MED ORDER — METOPROLOL TARTRATE 25 MG PO TABS: ORAL_TABLET | ORAL | 0 refills | Status: DC

## 2024-04-10 MED ORDER — EZETIMIBE 10 MG PO TABS: 10.0000 mg | ORAL_TABLET | Freq: Every day | ORAL | 3 refills | Status: AC

## 2024-04-10 NOTE — Patient Instructions (Signed)
 Medication Instructions:  Your physician recommends the following medication changes.  START TAKING: Zetia  10 mg daily  *If you need a refill on your cardiac medications before your next appointment, please call your pharmacy*  Lab Work: Your provider would like for you to have following labs drawn today BMP.   If you have labs (blood work) drawn today and your tests are completely normal, you will receive your results only by: MyChart Message (if you have MyChart) OR A paper copy in the mail If you have any lab test that is abnormal or we need to change your treatment, we will call you to review the results.  Testing/Procedures:   Your cardiac CT will be scheduled at one of the below locations:   [facility] [address] [place], [place] [address] [phone]  If scheduled at [facility], please arrive to the Heart and Vascular Center 15 mins early for check-in and test prep.  There is spacious parking and easy access to the radiology department from the [facility] entrance. Please enter here and check-in with the desk attendant.   Please follow these instructions carefully (unless otherwise directed):  An IV will be required for this test and Nitroglycerin will be given.  Hold all erectile dysfunction medications at least 3 days (72 hrs) prior to test. (Ie viagra, cialis, sildenafil, tadalafil, etc)   On the Night Before the Test: Be sure to Drink plenty of water . Do not consume any caffeinated/decaffeinated beverages or chocolate 12 hours prior to your test. Do not take any antihistamines 12 hours prior to your test.  On the Day of the Test: Drink plenty of water  until 1 hour prior to the test. Do not eat any food 1 hour prior to test. You may take your regular medications prior to the test.  Take metoprolol  (Lopressor ) two hours prior to test. If you take  Furosemide/Hydrochlorothiazide /Spironolactone/Chlorthalidone, please HOLD on the morning of the test. Patients who wear a continuous glucose monitor MUST remove the device prior to scanning. FEMALES- please wear underwire-free bra if available, avoid dresses & tight clothing       After the Test: Drink plenty of water . After receiving IV contrast, you may experience a mild flushed feeling. This is normal. On occasion, you may experience a mild rash up to 24 hours after the test. This is not dangerous. If this occurs, you can take Benadryl  25 mg, Zyrtec, Claritin, or Allegra and increase your fluid intake. (Patients taking Tikosyn should avoid Benadryl , and may take Zyrtec, Claritin, or Allegra) If you experience trouble breathing, this can be serious. If it is severe call 911 IMMEDIATELY. If it is mild, please call our office.  We will call to schedule your test 2-4 weeks out understanding that some insurance companies will need an authorization prior to the service being performed.   For more information and frequently asked questions, please visit our website : [email or web address]  For non-scheduling related questions, please contact the cardiac imaging nurse navigator should you have any questions/concerns: Cardiac Imaging Nurse Navigators Direct Office Dial: [phone]   For scheduling needs, including cancellations and rescheduling, please call [place], [phone].   Follow-Up: At [facility], you and your health needs are our priority.  As part of our continuing mission to provide you with exceptional heart care, our providers are all part of one team.  This team includes your primary Cardiologist (physician) and Advanced Practice Providers or APPs (Physician Assistants and Nurse  Practitioners) who all work together to provide you with the care you need, when you need it.  Your next appointment:   1 month(s) post procedure  Provider:   You may see  Lonni Hanson, MD or one of the following Advanced Practice Providers on your designated Care Team:   Lonni Meager, NP Lesley Maffucci, PA-C Bernardino Bring, PA-C Cadence Edgewood, PA-C Tylene Lunch, NP Barnie Hila, NP

## 2024-04-10 NOTE — Progress Notes (Signed)
 Cardiology Office Note    Date:  04/10/2024   ID:  Cathy Mullins, DOB [birth year], MRN [id number]  PCP:  Avelina Greig BRAVO, MD  Cardiologist:  Lonni Hanson, MD  Electrophysiologist:  None   Chief Complaint: Hospital follow up  History of Present Illness:   Cathy Mullins is a 78 y.o. female with history of mitral valve prolapse, prior tobacco use, TIA, hypertension, hyperlipidemia, type 2 diabetes, fibromyalgia, OSA, and CKD who presents for hospital follow-up.    Patient was previously evaluated by Dr. Gollan in 2011 for palpitations and chest pain.  Echocardiogram was completed and reportedly normal.  She was seen again in 2013 for recurrence of palpitations.  She was unable to afford event monitor and was prescribed propranolol  as needed.   Patient presented to [facility] ED 03/31/2024 for sudden onset chest pressure with pain radiating to the back and associated headache, shortness of breath, and nausea.  She checked her blood pressure at home and found to be 57/30, prompting her to call 911.  On EMS arrival her blood pressure had improved to 100/60.  EKG by EMS showed 1 mm inferolateral ST elevation.  On arrival to the ED, chest pain had resolved and EKG showed near complete resolution of ST elevation, revealing nonspecific ST segment changes.  Not meeting STEMI criteria.  Troponin was negative x 2 and initiation of heparin was deferred.  She was recommended to get echocardiogram and Lexiscan Myoview for further ischemic evaluation.  However, the patient preferred not to be admitted and was discharged from the ED with recommendation for close outpatient follow-up with cardiology.  Patient presents today overall doing well from a cardiac perspective.  She reports that she has been feeling at her baseline, without recurrence of chest pain since ED discharge.  She has been trying to drink a lot of water .  She reports that she had a fall back in 2021 and her physical health has deteriorated significantly  since then.  She used to dance for exercise prior to her fall.  However, since then she has been struggling with chronic pain and endocrine issues.  She now struggles to do simple tasks around the house.  She reports that her husband does most of the chores.  She helps with dishes and light housework.  She has chronic exertional dyspnea, which is unchanged from baseline and she attributes to deconditioning.  She reports recently finding out that she has progressive macular degeneration and her vision is rapidly deteriorating.  She is without symptoms of exertional angina and cardiac decompensation.  She denies palpitations, lightheadedness, dizziness, and lower extremity swelling.  Labs independently reviewed: 03/2022-Hgb 11.9, HCT 35, platelets 337, sodium 133, potassium 5.0, BUN 23, creatinine 1.22, normal LFTs, TC 203, TG 113, HDL 53, LDL 127  Objective   Past Medical History:  Diagnosis Date   Allergy    Asthma with COPD (HCC)    Complication of anesthesia    Difficulty waking after Spinal injection (2006)   Depression    daughter was very sick, depression began during this period of time    Diabetes mellitus    Esophagitis    Fibromyalgia    GERD (gastroesophageal reflux disease)    Glaucoma    History of colon polyps    HTN (hypertension)    Hyperlipidemia    Lichen sclerosus    Migraine    Perforating folliculitis    Spinal stenosis    Tachycardia    TIA (transient ischemic attack)  Current Medications: Current Meds  Medication Sig   brimonidine-timolol  (COMBIGAN) 0.2-0.5 % ophthalmic solution Apply 1 drop to eye 2 (two) times daily.   ezetimibe  (ZETIA ) 10 MG tablet Take 1 tablet (10 mg total) by mouth daily.   metoprolol  tartrate (LOPRESSOR ) 25 MG tablet TAKE 1 TABLET 2 HR PRIOR TO CARDIAC PROCEDURE   Current Facility-Administered Medications for the 04/10/24 encounter (Office Visit) with Lorene Lesley CROME, PA-C  Medication   betamethasone  acetate-betamethasone  sodium  phosphate (CELESTONE ) injection 3 mg    Allergies:   Amoxicillin-pot clavulanate, Codeine, Diclofenac , Hydrocodone-acetaminophen , Lovastatin, Soma [carisoprodol], Sulfonamide derivatives, Tizanidine, Sulfa antibiotics, and Tape   Social History   Socioeconomic History   Marital status: Married    Spouse name: Not on file   Number of children: 1   Years of education: Not on file   Highest education level: Associate degree: academic program  Occupational History   Occupation: retired Immunologist estate)    Associate Professor: retired  Tobacco Use   Smoking status: Former    Current packs/day: 0.00    Types: Cigarettes    Quit date: 09/27/1988    Years since quitting: 35.5   Smokeless tobacco: Never  Vaping Use   Vaping status: Never Used  Substance and Sexual Activity   Alcohol use: No    Alcohol/week: 0.0 standard drinks of alcohol   Drug use: No   Sexual activity: Not on file  Other Topics Concern   Not on file  Social History Narrative   Regular exercise--no   Diet: fruits and veggies, calcium, water , avoids fast food   Right handed   Caffeine use: 1 quart to 1/2 gallon of tea daily                  Social Drivers of Health   Financial Resource Strain: Low Risk  (02/28/2024)   Overall Financial Resource Strain (CARDIA)    Difficulty of Paying Living Expenses: Not hard at all  Food Insecurity: No Food Insecurity (02/28/2024)   Hunger Vital Sign    Worried About Running Out of Food in the Last Year: Never true    Ran Out of Food in the Last Year: Never true  Transportation Needs: No Transportation Needs (02/28/2024)   PRAPARE - Administrator, Civil Service (Medical): No    Lack of Transportation (Non-Medical): No  Physical Activity: Inactive (02/28/2024)   Exercise Vital Sign    Days of Exercise per Week: 0 days    Minutes of Exercise per Session: Not on file  Stress: No Stress Concern Present (02/28/2024)   [employer] of Occupational Health - Occupational  Stress Questionnaire    Feeling of Stress: Not at all  Social Connections: Moderately Isolated (02/28/2024)   Social Connection and Isolation Panel    Frequency of Communication with Friends and Family: More than three times a week    Frequency of Social Gatherings with Friends and Family: Patient declined    Attends Religious Services: Patient declined    Database administrator or Organizations: No    Attends Engineer, structural: Not on file    Marital Status: Married     Family History:  The patient's family history includes Alzheimer's disease in her maternal aunt, maternal aunt, maternal grandmother, and mother; Breast cancer (age of onset: 6) in her mother; Cancer in her mother; Coronary artery disease in her father; Diabetes in her brother and maternal aunt; Kidney disease in her father; Parkinson's disease in her father; Stroke  in her father and mother; Valvular heart disease in her mother. There is no history of Colon cancer, Pancreatic cancer, or Stomach cancer.  ROS:   12-point review of systems is negative unless otherwise noted in the HPI.  EKGs/Other Studies Reviewed:    EKG:  EKG personally reviewed by me today EKG Interpretation Date/Time:  Tuesday April 10 2024 14:39:37 EDT Ventricular Rate:  56 PR Interval:  164 QRS Duration:  76 QT Interval:  422 QTC Calculation: 407 R Axis:   42  Text Interpretation: Sinus bradycardia Nonspecific ST and T wave abnormality No significant change since last tracing Confirmed by Lorene Sinclair ([id number]) on 04/10/2024 2:46:53 PM  PHYSICAL EXAM:    VS:  BP (!) 159/68 (BP Location: Left Wrist, Patient Position: Sitting, Cuff Size: Normal)   Pulse (!) 56   Ht 5' 2 (1.575 m)   Wt 162 lb 6.4 oz (73.7 kg)   SpO2 100%   BMI 29.70 kg/m   BMI: Body mass index is 29.7 kg/m.  GEN: Well nourished, well developed in no acute distress NECK: No JVD; No carotid bruits CARDIAC: RRR, no murmurs, rubs, gallops RESPIRATORY:  Clear to  auscultation without rales, wheezing or rhonchi  ABDOMEN: Soft, non-tender, non-distended EXTREMITIES: No edema; No deformity  Wt Readings from Last 3 Encounters:  04/10/24 162 lb 6.4 oz (73.7 kg)  03/31/24 166 lb 7.2 oz (75.5 kg)  03/27/24 159 lb 8 oz (72.3 kg)       ASSESSMENT & PLAN:   Chest pain Abnormal EKG - Recent ED visit for chest pressure with initial EKG showing 1 mm ST elevation in the inferolateral leads. Upon arrival to the ED, chest pain had resolved and EKG changes had improved. Troponin negative x 2. Patient was discharged from ED without further testing and recommendation for close cardiology follow up.  Today, she denies any recurrence of symptoms.  Recommend coronary CTA for ischemic evaluation.  Check BMP today.  Low threshold to start aspirin  81 mg daily pending results.  Hyperlipidemia - Lipid panel 03/2024 with LDL 127.  She is reportedly intolerant to multiple statins and unwilling to retry at this time. She does not want to take any injectable medications. Recommend starting ezetimibe  10 mg daily. Repeat lipid panel and CMP in 3 months.   Hypertension - BP moderately elevated today. Given recent ED visit with hypotension and well controlled readings on chart review, will defer medication adjustment at this time.   CKD III - Most recent creatinine 1.22, stable.  Check BMP as above.  Disposition: Coronary CTA. Check BMP. Start Zetia  10 mg daily. F/u with Dr. Mady or an APP in 1 month.   Medication Adjustments/Labs and Tests Ordered: Current medicines are reviewed at length with the patient today.  Concerns regarding medicines are outlined above. Medication changes, Labs and Tests ordered today are summarized above and listed in the Patient Instructions accessible in Encounters.   Bonney Sinclair Lorene, PA-C 04/10/2024 3:26 PM     [facility] [place] [address] [place], [place] [address] [phone]

## 2024-04-11 ENCOUNTER — Ambulatory Visit: Payer: Self-pay | Admitting: Physician Assistant

## 2024-04-11 LAB — BASIC METABOLIC PANEL WITH GFR
BUN/Creatinine Ratio: 16 (ref 12–28)
BUN: 17 mg/dL (ref 8–27)
CO2: 19 mmol/L — ABNORMAL LOW (ref 20–29)
Calcium: 10.6 mg/dL — ABNORMAL HIGH (ref 8.7–10.3)
Chloride: 100 mmol/L (ref 96–106)
Creatinine, Ser: 1.09 mg/dL — ABNORMAL HIGH (ref 0.57–1.00)
Glucose: 96 mg/dL (ref 70–99)
Potassium: 5 mmol/L (ref 3.5–5.2)
Sodium: 137 mmol/L (ref 134–144)
eGFR: 52 mL/min/1.73 — ABNORMAL LOW (ref >59)

## 2024-04-16 ENCOUNTER — Other Ambulatory Visit (HOSPITAL_COMMUNITY)

## 2024-05-01 ENCOUNTER — Encounter (HOSPITAL_COMMUNITY): Payer: Self-pay

## 2024-05-03 ENCOUNTER — Ambulatory Visit
Admission: RE | Admit: 2024-05-03 | Discharge: 2024-05-03 | Disposition: A | Discharge status: Home / Self Care | Source: Ambulatory Visit | Attending: Physician Assistant | Admitting: Physician Assistant

## 2024-05-03 DIAGNOSIS — R072 Precordial pain: Secondary | ICD-10-CM | POA: Insufficient documentation

## 2024-05-03 MED ORDER — NITROGLYCERIN 0.4 MG SL SUBL
0.8000 mg | SUBLINGUAL_TABLET | Freq: Once | SUBLINGUAL | Status: AC
  Administered 2024-05-03: 0.8 mg via SUBLINGUAL
  Filled 2024-05-03: qty 25

## 2024-05-03 MED ORDER — IOHEXOL 350 MG/ML SOLN
100.0000 mL | Freq: Once | INTRAVENOUS | Status: AC | PRN
  Administered 2024-05-03: 100 mL via INTRAVENOUS

## 2024-05-03 NOTE — Progress Notes (Signed)
 Patient tolerated procedure well. W/C to lobby.  Ambulate w/o difficulty. Denies light headedness or being dizzy. Encouraged to drink extra water today and reasoning explained. Verbalized understanding. All questions answered. ABC intact. No further needs. Discharge from procedure area w/o issues.

## 2024-05-04 NOTE — Progress Notes (Signed)
 Last read by [name] at 10:22PM on 05/03/2024.

## 2024-05-07 DIAGNOSIS — H353221 Exudative age-related macular degeneration, left eye, with active choroidal neovascularization: Secondary | ICD-10-CM | POA: Diagnosis not present

## 2024-05-25 ENCOUNTER — Ambulatory Visit (INDEPENDENT_AMBULATORY_CARE_PROVIDER_SITE_OTHER): Admitting: Family Medicine

## 2024-05-25 ENCOUNTER — Encounter: Payer: Self-pay | Admitting: Family Medicine

## 2024-05-25 VITALS — BP 171/83 | HR 62 | Temp 98.5°F | Ht 60.75 in | Wt 167.1 lb

## 2024-05-25 DIAGNOSIS — I959 Hypotension, unspecified: Secondary | ICD-10-CM

## 2024-05-25 DIAGNOSIS — H353230 Exudative age-related macular degeneration, bilateral, stage unspecified: Secondary | ICD-10-CM

## 2024-05-25 MED ORDER — METOPROLOL TARTRATE 25 MG PO TABS: 12.5000 mg | ORAL_TABLET | Freq: Two times a day (BID) | ORAL | 0 refills | Status: DC

## 2024-05-25 NOTE — Progress Notes (Signed)
 Patient ID: Cathy Mullins, female    DOB: [birth year], 78 y.o.   MRN: [id number]  This visit was conducted in person.  BP (!) 171/83   Pulse 62   Temp 98.5 F (36.9 C) (Temporal)   Ht 5' 0.75 (1.543 m)   Wt 167 lb 2 oz (75.8 kg)   SpO2 99%   BMI 31.84 kg/m    CC:  Chief Complaint  Patient presents with   Medical Management of Chronic Issues    Concerned about Combigan eye drops    Subjective:   HPI: Cathy Mullins is a 78 y.o. female presenting on 05/25/2024 for Medical Management of Chronic Issues (Concerned about Combigan eye drops)   Has been diagnosed with wet macular degeneration and glaucoma.  Dr.  Diona Mana  She has been Placed  on combigan  [facility] ED 03/31/2024 for sudden onset chest pressure with pain radiating to the back and associated headache, shortness of breath, and nausea. She checked her blood pressure at home and found to be 57/30, prompting her to call 911. On EMS arrival her blood pressure had improved to 100/60. EKG by EMS showed 1 mm inferolateral ST elevation. On arrival to the ED, chest pain had resolved and EKG showed near complete resolution of ST elevation, revealing nonspecific ST segment changes. Not meeting STEMI criteria. Troponin was negative x 2 and initiation of heparin was deferred. She was recommended to get echocardiogram and Lexiscan Myoview for further ischemic evaluation.     Stopped lisinopril       BP is dropping within and hour of taking metoprolol .  Off metoprolol  50 mg BID   111/63  Reviewed  04/10/2024 Cardiology note. Started on zetia .  CAC was low  Relevant past medical, surgical, family and social history reviewed and updated as indicated. Interim medical history since our last visit reviewed. Allergies and medications reviewed and updated. Outpatient Medications Prior to Visit  Medication Sig Dispense Refill   Accu-Chek Softclix Lancets lancets TEST BLOOD SUGAR TWICE DAILY 200 each 3   Alcohol Swabs (DROPSAFE ALCOHOL PREP) 70 %  PADS USE TWO TIMES DAILY 200 each 3   Blood Glucose Calibration (ACCU-CHEK AVIVA) SOLN Use to check controls on glucose meter 1 each 3   Blood Glucose Monitoring Suppl (ACCU-CHEK AVIVA PLUS) w/Device KIT Use to check blood sugar two times a day 1 kit 0   brimonidine-timolol  (COMBIGAN) 0.2-0.5 % ophthalmic solution Apply 1 drop to eye 2 (two) times daily.     desipramine  (NORPRAMIN ) 25 MG tablet Take 1 tablet (25 mg total) by mouth at bedtime. 90 tablet 1   ezetimibe  (ZETIA ) 10 MG tablet Take 1 tablet (10 mg total) by mouth daily. 90 tablet 3   glucose blood (ACCU-CHEK AVIVA PLUS) test strip TEST BLOOD SUGAR TWICE DAILY 200 strip 0   hydrocortisone  (ANUSOL -HC) 2.5 % rectal cream Place 1 Application rectally 2 (two) times daily. (Patient taking differently: Place 1 Application rectally 2 (two) times daily. Patient states takes as needed.) 30 g 1   latanoprost  (XALATAN ) 0.005 % ophthalmic solution Place 1 drop into both eyes at bedtime.     metFORMIN  (GLUCOPHAGE -XR) 500 MG 24 hr tablet Take 1 tablet (500 mg total) by mouth 2 (two) times daily with a meal. 180 tablet 3   Multiple Vitamins-Minerals (PRESERVISION AREDS PO) Take by mouth.     brimonidine (ALPHAGAN) 0.2 % ophthalmic solution Place 1 drop into both eyes in the morning and at bedtime.  lisinopril  (ZESTRIL ) 10 MG tablet TAKE 1 TABLET EVERY DAY 90 tablet 3   betamethasone  acetate-betamethasone  sodium phosphate (CELESTONE ) injection 3 mg      No facility-administered medications prior to visit.     Per HPI unless specifically indicated in ROS section below Review of Systems  Constitutional:  Negative for fatigue and fever.  HENT:  Negative for congestion and ear pain.   Eyes:  Negative for pain.  Respiratory:  Negative for cough, chest tightness and shortness of breath.   Cardiovascular:  Negative for chest pain, palpitations and leg swelling.  Gastrointestinal:  Negative for abdominal pain.  Genitourinary:  Negative for dysuria and  vaginal bleeding.  Musculoskeletal:  Negative for back pain.  Neurological:  Positive for light-headedness. Negative for syncope and headaches.  Psychiatric/Behavioral:  Negative for dysphoric mood.    Objective:  BP (!) 171/83   Pulse 62   Temp 98.5 F (36.9 C) (Temporal)   Ht 5' 0.75 (1.543 m)   Wt 167 lb 2 oz (75.8 kg)   SpO2 99%   BMI 31.84 kg/m   Wt Readings from Last 3 Encounters:  06/11/24 160 lb (72.6 kg)  05/25/24 167 lb 2 oz (75.8 kg)  04/10/24 162 lb 6.4 oz (73.7 kg)      Physical Exam Constitutional:      General: She is not in acute distress.    Appearance: Normal appearance. She is well-developed. She is not ill-appearing or toxic-appearing.  HENT:     Head: Normocephalic.     Right Ear: Hearing, tympanic membrane, ear canal and external ear normal. Tympanic membrane is not erythematous, retracted or bulging.     Left Ear: Hearing, tympanic membrane, ear canal and external ear normal. Tympanic membrane is not erythematous, retracted or bulging.     Nose: No mucosal edema or rhinorrhea.     Right Sinus: No maxillary sinus tenderness or frontal sinus tenderness.     Left Sinus: No maxillary sinus tenderness or frontal sinus tenderness.     Mouth/Throat:     Pharynx: Uvula midline.  Eyes:     General: Lids are normal. Lids are everted, no foreign bodies appreciated.     Conjunctiva/sclera: Conjunctivae normal.     Pupils: Pupils are equal, round, and reactive to light.  Neck:     Thyroid : No thyroid  mass or thyromegaly.     Vascular: No carotid bruit.     Trachea: Trachea normal.  Cardiovascular:     Rate and Rhythm: Normal rate and regular rhythm.     Pulses: Normal pulses.     Heart sounds: Normal heart sounds, S1 normal and S2 normal. No murmur heard.    No friction rub. No gallop.  Pulmonary:     Effort: Pulmonary effort is normal. No tachypnea or respiratory distress.     Breath sounds: Normal breath sounds. No decreased breath sounds, wheezing,  rhonchi or rales.  Abdominal:     General: Bowel sounds are normal.     Palpations: Abdomen is soft.     Tenderness: There is no abdominal tenderness.  Musculoskeletal:     Cervical back: Normal range of motion and neck supple.  Skin:    General: Skin is warm and dry.     Findings: No rash.  Neurological:     Mental Status: She is alert.  Psychiatric:        Mood and Affect: Mood is not anxious or depressed.        Speech: Speech normal.  Behavior: Behavior normal. Behavior is cooperative.        Thought Content: Thought content normal.        Judgment: Judgment normal.       Results for orders placed or performed in visit on 04/10/24  Basic metabolic panel with GFR   Collection Time: 04/10/24  3:49 PM  Result Value Ref Range   Glucose 96 70 - 99 mg/dL   BUN 17 8 - 27 mg/dL   Creatinine, Ser 8.90 (H) 0.57 - 1.00 mg/dL   eGFR 52 (L) >40 fO/fpw/8.26   BUN/Creatinine Ratio 16 12 - 28   Sodium 137 134 - 144 mmol/L   Potassium 5.0 3.5 - 5.2 mmol/L   Chloride 100 96 - 106 mmol/L   CO2 19 (L) 20 - 29 mmol/L   Calcium 10.6 (H) 8.7 - 10.3 mg/dL    Assessment and Plan  Hypotension, unspecified hypotension type Assessment & Plan: Acute, likely in part secondary to medication and hypovolemia. Encouraged water  intake.  Remain off lisinopril .  Decrease metoprolol  further to half a tablet twice daily. Patient feels low blood pressure may also be due to additional eyedrops for wet macular degeneration.  She is unable to decrease these.  Return and ER precautions provided    Bilateral exudative age-related macular degeneration, unspecified stage [facility]) Assessment & Plan: New diagnosis, followed by ophthalmology.   Other orders -     Metoprolol  Tartrate; Take 0.5 tablets (12.5 mg total) by mouth 2 (two) times daily.  Dispense: 60 tablet; Refill: 0    No follow-ups on file.   Greig Ring, MD

## 2024-06-10 ENCOUNTER — Emergency Department

## 2024-06-10 ENCOUNTER — Inpatient Hospital Stay
Admission: EM | Admit: 2024-06-10 | Discharge: 2024-06-13 | DRG: 487 | Disposition: A | Discharge status: Home Health | Attending: Internal Medicine | Admitting: Internal Medicine

## 2024-06-10 ENCOUNTER — Other Ambulatory Visit: Payer: Self-pay

## 2024-06-10 DIAGNOSIS — Z820 Family history of epilepsy and other diseases of the nervous system: Secondary | ICD-10-CM | POA: Diagnosis not present

## 2024-06-10 DIAGNOSIS — R609 Edema, unspecified: Secondary | ICD-10-CM | POA: Diagnosis not present

## 2024-06-10 DIAGNOSIS — J4489 Other specified chronic obstructive pulmonary disease: Secondary | ICD-10-CM | POA: Diagnosis not present

## 2024-06-10 DIAGNOSIS — E1122 Type 2 diabetes mellitus with diabetic chronic kidney disease: Secondary | ICD-10-CM | POA: Diagnosis present

## 2024-06-10 DIAGNOSIS — M797 Fibromyalgia: Secondary | ICD-10-CM | POA: Diagnosis not present

## 2024-06-10 DIAGNOSIS — Z8249 Family history of ischemic heart disease and other diseases of the circulatory system: Secondary | ICD-10-CM

## 2024-06-10 DIAGNOSIS — Z833 Family history of diabetes mellitus: Secondary | ICD-10-CM | POA: Diagnosis not present

## 2024-06-10 DIAGNOSIS — E1139 Type 2 diabetes mellitus with other diabetic ophthalmic complication: Secondary | ICD-10-CM | POA: Diagnosis not present

## 2024-06-10 DIAGNOSIS — I129 Hypertensive chronic kidney disease with stage 1 through stage 4 chronic kidney disease, or unspecified chronic kidney disease: Secondary | ICD-10-CM | POA: Diagnosis not present

## 2024-06-10 DIAGNOSIS — Z803 Family history of malignant neoplasm of breast: Secondary | ICD-10-CM

## 2024-06-10 DIAGNOSIS — Z888 Allergy status to other drugs, medicaments and biological substances status: Secondary | ICD-10-CM

## 2024-06-10 DIAGNOSIS — Z882 Allergy status to sulfonamides status: Secondary | ICD-10-CM

## 2024-06-10 DIAGNOSIS — E1129 Type 2 diabetes mellitus with other diabetic kidney complication: Secondary | ICD-10-CM | POA: Diagnosis present

## 2024-06-10 DIAGNOSIS — H409 Unspecified glaucoma: Secondary | ICD-10-CM | POA: Diagnosis not present

## 2024-06-10 DIAGNOSIS — M009 Pyogenic arthritis, unspecified: Secondary | ICD-10-CM | POA: Diagnosis not present

## 2024-06-10 DIAGNOSIS — E7849 Other hyperlipidemia: Secondary | ICD-10-CM | POA: Diagnosis present

## 2024-06-10 DIAGNOSIS — E1159 Type 2 diabetes mellitus with other circulatory complications: Secondary | ICD-10-CM | POA: Diagnosis not present

## 2024-06-10 DIAGNOSIS — M1120 Other chondrocalcinosis, unspecified site: Secondary | ICD-10-CM | POA: Diagnosis present

## 2024-06-10 DIAGNOSIS — Z8419 Family history of other disorders of kidney and ureter: Secondary | ICD-10-CM

## 2024-06-10 DIAGNOSIS — M659 Unspecified synovitis and tenosynovitis, unspecified site: Secondary | ICD-10-CM | POA: Diagnosis present

## 2024-06-10 DIAGNOSIS — Z8673 Personal history of transient ischemic attack (TIA), and cerebral infarction without residual deficits: Secondary | ICD-10-CM

## 2024-06-10 DIAGNOSIS — Z87891 Personal history of nicotine dependence: Secondary | ICD-10-CM | POA: Diagnosis not present

## 2024-06-10 DIAGNOSIS — M6588 Other synovitis and tenosynovitis, other site: Secondary | ICD-10-CM | POA: Diagnosis not present

## 2024-06-10 DIAGNOSIS — I152 Hypertension secondary to endocrine disorders: Secondary | ICD-10-CM | POA: Diagnosis not present

## 2024-06-10 DIAGNOSIS — K219 Gastro-esophageal reflux disease without esophagitis: Secondary | ICD-10-CM | POA: Diagnosis present

## 2024-06-10 DIAGNOSIS — M25461 Effusion, right knee: Secondary | ICD-10-CM | POA: Diagnosis not present

## 2024-06-10 DIAGNOSIS — E785 Hyperlipidemia, unspecified: Secondary | ICD-10-CM | POA: Diagnosis not present

## 2024-06-10 DIAGNOSIS — F32A Depression, unspecified: Secondary | ICD-10-CM | POA: Diagnosis not present

## 2024-06-10 DIAGNOSIS — R000 Tachycardia, unspecified: Secondary | ICD-10-CM | POA: Diagnosis present

## 2024-06-10 DIAGNOSIS — Z91048 Other nonmedicinal substance allergy status: Secondary | ICD-10-CM

## 2024-06-10 DIAGNOSIS — Z886 Allergy status to analgesic agent status: Secondary | ICD-10-CM

## 2024-06-10 DIAGNOSIS — Z6831 Body mass index (BMI) 31.0-31.9, adult: Secondary | ICD-10-CM | POA: Diagnosis not present

## 2024-06-10 DIAGNOSIS — M109 Gout, unspecified: Secondary | ICD-10-CM | POA: Diagnosis present

## 2024-06-10 DIAGNOSIS — N1831 Chronic kidney disease, stage 3a: Secondary | ICD-10-CM | POA: Diagnosis not present

## 2024-06-10 DIAGNOSIS — Z90710 Acquired absence of both cervix and uterus: Secondary | ICD-10-CM

## 2024-06-10 DIAGNOSIS — Z79899 Other long term (current) drug therapy: Secondary | ICD-10-CM

## 2024-06-10 DIAGNOSIS — E1169 Type 2 diabetes mellitus with other specified complication: Secondary | ICD-10-CM | POA: Diagnosis not present

## 2024-06-10 DIAGNOSIS — M25561 Pain in right knee: Secondary | ICD-10-CM | POA: Diagnosis present

## 2024-06-10 DIAGNOSIS — Z7984 Long term (current) use of oral hypoglycemic drugs: Secondary | ICD-10-CM

## 2024-06-10 DIAGNOSIS — R509 Fever, unspecified: Secondary | ICD-10-CM | POA: Diagnosis not present

## 2024-06-10 DIAGNOSIS — E663 Overweight: Secondary | ICD-10-CM | POA: Diagnosis present

## 2024-06-10 DIAGNOSIS — Z860100 Personal history of colon polyps, unspecified: Secondary | ICD-10-CM

## 2024-06-10 DIAGNOSIS — Z885 Allergy status to narcotic agent status: Secondary | ICD-10-CM

## 2024-06-10 DIAGNOSIS — Z823 Family history of stroke: Secondary | ICD-10-CM

## 2024-06-10 DIAGNOSIS — G43909 Migraine, unspecified, not intractable, without status migrainosus: Secondary | ICD-10-CM | POA: Diagnosis present

## 2024-06-10 LAB — CBC WITH DIFFERENTIAL/PLATELET
Abs Immature Granulocytes: 0.06 K/uL (ref 0.00–0.07)
Basophils Absolute: 0.1 K/uL (ref 0.0–0.1)
Basophils Relative: 1 %
Eosinophils Absolute: 0 K/uL (ref 0.0–0.5)
Eosinophils Relative: 0 %
HCT: 33.3 % — ABNORMAL LOW (ref 36.0–46.0)
Hemoglobin: 11.4 g/dL — ABNORMAL LOW (ref 12.0–15.0)
Immature Granulocytes: 0 %
Lymphocytes Relative: 14 %
Lymphs Abs: 2.2 K/uL (ref 0.7–4.0)
MCH: 29.9 pg (ref 26.0–34.0)
MCHC: 34.2 g/dL (ref 30.0–36.0)
MCV: 87.4 fL (ref 80.0–100.0)
Monocytes Absolute: 2.1 K/uL — ABNORMAL HIGH (ref 0.1–1.0)
Monocytes Relative: 13 %
Neutro Abs: 11.7 K/uL — ABNORMAL HIGH (ref 1.7–7.7)
Neutrophils Relative %: 72 %
Platelets: 318 K/uL (ref 150–400)
RBC: 3.81 MIL/uL — ABNORMAL LOW (ref 3.87–5.11)
RDW: 12.9 % (ref 11.5–15.5)
WBC: 16.1 K/uL — ABNORMAL HIGH (ref 4.0–10.5)
nRBC: 0 % (ref 0.0–0.2)

## 2024-06-10 LAB — COMPREHENSIVE METABOLIC PANEL WITH GFR
ALT: 10 U/L (ref 0–44)
AST: 25 U/L (ref 15–41)
Albumin: 3.3 g/dL — ABNORMAL LOW (ref 3.5–5.0)
Alkaline Phosphatase: 52 U/L (ref 38–126)
Anion gap: 13 (ref 5–15)
BUN: 22 mg/dL (ref 8–23)
CO2: 23 mmol/L (ref 22–32)
Calcium: 9 mg/dL (ref 8.9–10.3)
Chloride: 98 mmol/L (ref 98–111)
Creatinine, Ser: 1.14 mg/dL — ABNORMAL HIGH (ref 0.44–1.00)
GFR, Estimated: 49 mL/min — ABNORMAL LOW (ref >60)
Glucose, Bld: 168 mg/dL — ABNORMAL HIGH (ref 70–99)
Potassium: 3.7 mmol/L (ref 3.5–5.1)
Sodium: 134 mmol/L — ABNORMAL LOW (ref 135–145)
Total Bilirubin: 0.7 mg/dL (ref 0.0–1.2)
Total Protein: 7.1 g/dL (ref 6.5–8.1)

## 2024-06-10 LAB — SYNOVIAL CELL COUNT + DIFF, W/ CRYSTALS
Crystals, Fluid: NONE SEEN
Eosinophils-Synovial: 0 % (ref 0–1)
Lymphocytes-Synovial Fld: 0 % (ref 0–20)
Monocyte-Macrophage-Synovial Fluid: 10 % — ABNORMAL LOW (ref 50–90)
Neutrophil, Synovial: 90 % — ABNORMAL HIGH (ref 0–25)
WBC, Synovial: 26775 /mm3 — ABNORMAL HIGH (ref 0–200)

## 2024-06-10 LAB — GLUCOSE, CAPILLARY
Glucose-Capillary: 154 mg/dL — ABNORMAL HIGH (ref 70–99)
Glucose-Capillary: 119 mg/dL — ABNORMAL HIGH (ref 70–99)
Glucose-Capillary: 135 mg/dL — ABNORMAL HIGH (ref 70–99)

## 2024-06-10 LAB — C-REACTIVE PROTEIN: CRP: 12.9 mg/dL — ABNORMAL HIGH (ref <1.0)

## 2024-06-10 LAB — SEDIMENTATION RATE: Sed Rate: 47 mm/h — ABNORMAL HIGH (ref 0–30)

## 2024-06-10 LAB — LACTIC ACID, PLASMA
Lactic Acid, Venous: 2.5 mmol/L (ref 0.5–1.9)
Lactic Acid, Venous: 1.3 mmol/L (ref 0.5–1.9)

## 2024-06-10 LAB — CBG MONITORING, ED: Glucose-Capillary: 155 mg/dL — ABNORMAL HIGH (ref 70–99)

## 2024-06-10 LAB — PROCALCITONIN: Procalcitonin: <0.1 ng/mL

## 2024-06-10 MED ORDER — HYDRALAZINE HCL 20 MG/ML IJ SOLN: 5.0000 mg | Freq: Four times a day (QID) | INTRAMUSCULAR | Status: DC | PRN

## 2024-06-10 MED ORDER — VANCOMYCIN HCL 1750 MG/350ML IV SOLN
1750.0000 mg | Freq: Once | INTRAVENOUS | Status: AC
Start: 2024-06-10 — End: 2024-06-10
  Administered 2024-06-10: 1750 mg via INTRAVENOUS
  Filled 2024-06-10 (×2): qty 350

## 2024-06-10 MED ORDER — ORAL CARE MOUTH RINSE: 15.0000 mL | OROMUCOSAL | Status: DC | PRN

## 2024-06-10 MED ORDER — LACTATED RINGERS IV SOLN: INTRAVENOUS | Status: AC

## 2024-06-10 MED ORDER — SODIUM CHLORIDE 0.9 % IV SOLN
2.0000 g | INTRAVENOUS | Status: DC
  Administered 2024-06-11 – 2024-06-13 (×3): 2 g via INTRAVENOUS
  Filled 2024-06-10 (×3): qty 20

## 2024-06-10 MED ORDER — INSULIN ASPART 100 UNIT/ML IJ SOLN
0.0000 [IU] | Freq: Three times a day (TID) | INTRAMUSCULAR | Status: DC
  Administered 2024-06-10: 2 [IU] via SUBCUTANEOUS
  Administered 2024-06-11: 1 [IU] via SUBCUTANEOUS
  Administered 2024-06-12: 2 [IU] via SUBCUTANEOUS
  Filled 2024-06-10 (×3): qty 1

## 2024-06-10 MED ORDER — INSULIN ASPART 100 UNIT/ML IJ SOLN
0.0000 [IU] | Freq: Every day | INTRAMUSCULAR | Status: DC
  Administered 2024-06-11: 3 [IU] via SUBCUTANEOUS
  Filled 2024-06-10: qty 1

## 2024-06-10 MED ORDER — VANCOMYCIN HCL IN DEXTROSE 1-5 GM/200ML-% IV SOLN
1000.0000 mg | Freq: Once | INTRAVENOUS | Status: DC
  Filled 2024-06-10: qty 200

## 2024-06-10 MED ORDER — ACETAMINOPHEN 650 MG RE SUPP: 650.0000 mg | Freq: Four times a day (QID) | RECTAL | Status: DC | PRN

## 2024-06-10 MED ORDER — MORPHINE SULFATE (PF) 2 MG/ML IV SOLN
2.0000 mg | INTRAVENOUS | Status: AC | PRN
  Administered 2024-06-10 – 2024-06-11 (×2): 2 mg via INTRAVENOUS
  Filled 2024-06-10 (×2): qty 1

## 2024-06-10 MED ORDER — MORPHINE SULFATE (PF) 4 MG/ML IV SOLN
4.0000 mg | Freq: Once | INTRAVENOUS | Status: AC
  Administered 2024-06-10: 4 mg via INTRAVENOUS
  Filled 2024-06-10: qty 1

## 2024-06-10 MED ORDER — ONDANSETRON HCL 4 MG/2ML IJ SOLN: 4.0000 mg | Freq: Four times a day (QID) | INTRAMUSCULAR | Status: DC | PRN

## 2024-06-10 MED ORDER — LATANOPROST 0.005 % OP SOLN
1.0000 [drp] | Freq: Every day | OPHTHALMIC | Status: DC
  Administered 2024-06-10 – 2024-06-12 (×3): 1 [drp] via OPHTHALMIC
  Filled 2024-06-10: qty 2.5

## 2024-06-10 MED ORDER — VANCOMYCIN HCL 750 MG/150ML IV SOLN
750.0000 mg | INTRAVENOUS | Status: DC
  Administered 2024-06-11: 750 mg via INTRAVENOUS
  Filled 2024-06-10: qty 150

## 2024-06-10 MED ORDER — MORPHINE SULFATE (PF) 4 MG/ML IV SOLN
4.0000 mg | INTRAVENOUS | Status: AC | PRN
  Administered 2024-06-10 – 2024-06-11 (×2): 4 mg via INTRAVENOUS
  Filled 2024-06-10 (×2): qty 1

## 2024-06-10 MED ORDER — LIDOCAINE HCL 1 % IJ SOLN
30.0000 mL | Freq: Once | INTRAMUSCULAR | Status: AC
  Administered 2024-06-10: 30 mL
  Filled 2024-06-10 (×2): qty 30

## 2024-06-10 MED ORDER — ACETAMINOPHEN 325 MG PO TABS
650.0000 mg | ORAL_TABLET | Freq: Four times a day (QID) | ORAL | Status: DC | PRN
  Administered 2024-06-12 (×3): 650 mg via ORAL
  Filled 2024-06-10 (×3): qty 2

## 2024-06-10 MED ORDER — ONDANSETRON HCL 4 MG PO TABS: 4.0000 mg | ORAL_TABLET | Freq: Four times a day (QID) | ORAL | Status: DC | PRN

## 2024-06-10 MED ORDER — SODIUM CHLORIDE 0.9 % IV SOLN
2.0000 g | Freq: Once | INTRAVENOUS | Status: AC
  Administered 2024-06-10: 2 g via INTRAVENOUS
  Filled 2024-06-10: qty 20

## 2024-06-10 NOTE — Assessment & Plan Note (Addendum)
 High suspicion for septic joint Status post arthrocentesis per EDP Synovial cell count show WBC is 26,775 with neutrophil of 90% Does not quite meet criteria for septic joint per orthopedic surgeon Per EDP, orthopedic service states if patient continues to have pain we will consider washout tomorrow Will continue with ceftriaxone 2 g IV daily and vancomycin per pharmacy Symptomatic support: Morphine 2 mg IV every 4 hours as needed for moderate pain, morphine 4 mg IV every 4 hours as needed for severe pain, 20 hours of coverage ordered.  AM team to reevaluate patient at bedside for continued IV pain medication requirements.

## 2024-06-10 NOTE — H&P (Addendum)
 History and Physical   SIGRID SCHWEBACH FMW:[id number] DOB: Dec 25, 1945 DOA: 06/10/2024  PCP: Avelina Greig BRAVO, MD  Patient coming from: Home  I have personally briefly reviewed patient's old medical records in [facility] EMR.  Chief Concern: Right knee pain  HPI: Ms. Cathy Mullins is a 78 year old female with history of non-insulin -dependent diabetes mellitus type 2, hypertension, fibromyalgia, hyperlipidemia, who presents ED for chief concerns of right knee pain.  Vitals in the ED showed t 98.4, rr 16, hr 76, blood pressure 104/79, SpO2 100% on room air.  Serum sodium is 134, potassium 3.7, chloride 98, bicarb 23, BUN of 22, serum creatinine 1.14, eGFR 49, nonfasting blood glucose 168, WBC 16.1, hemoglobin 11.4, platelets of 318.  Lactic acid was initially 2.5 and on repeat is 1.3.  CRP is 12.9, sed rate 47, synovial joint: Cloudy, WBC 26,775, and neutrophil in the synovial was 90%.  Sed rate was 47, CRP 12.9.  ED treatment: Ceftriaxone 2, IV one-time dose, vancomycin per pharmacy, lidocaine , morphine 4 mg.  Patient received arthrocentesis via EDP.  EDP consulted orthopedic provider who states he will evaluate patient given the synovial fluid not entirely consistent with septic joint.  Continue IV antibiotic and steroids and if symptoms continue, can have washout with orthopedic service tomorrow. ---------------------------------------- At bedside, patient patient was able to tell me her first last name, age, location, current calendar year.  Patient reports that the right knee started hurting about 6 days ago.  She denies trauma to her person.  She denies fever, chest pain, shortness of breath, abdominal pain, dysuria, hematuria, diarrhea.  She reports she has never had pain like this before.  She reports pain with flexing and extending her knee.  Social history: She lives at home with her husband.  She denies tobacco, EtOH, recreational drug use.  She is retired.  ROS: Constitutional: no  weight change, no fever ENT/Mouth: no sore throat, no rhinorrhea Eyes: no eye pain, no vision changes Cardiovascular: no chest pain, no dyspnea,  no edema, no palpitations Respiratory: no cough, no sputum, no wheezing Gastrointestinal: no nausea, no vomiting, no diarrhea, no constipation Genitourinary: no urinary incontinence, no dysuria, no hematuria Musculoskeletal: + arthralgias of the right knee, no myalgias Skin: no skin lesions, no pruritus, + right knee skin is hot Neuro: no weakness, no loss of consciousness, no syncope Psych: no anxiety, no depression, no decrease appetite Heme/Lymph: no bruising, no bleeding  ED Course: Discussed with EDP, patient requiring hospitalization for chief concerns of possible right knee septic joint.  Assessment/Plan  Principal Problem:   Right knee pain Active Problems:   Septic joint (HCC)   DM (diabetes mellitus), type 2 with renal complications (HCC)   Hyperlipidemia associated with type 2 diabetes mellitus (HCC)   Hypertension associated with diabetes (HCC)   GERD   Fibromyalgia   CKD stage 3a, GFR 45-59 ml/min (HCC)   Assessment and Plan:  * Right knee pain High suspicion for septic joint Status post arthrocentesis per EDP Synovial cell count show WBC is 26,775 with neutrophil of 90% Does not quite meet criteria for septic joint per orthopedic surgeon Per EDP, orthopedic service states if patient continues to have pain we will consider washout tomorrow Will continue with ceftriaxone 2 g IV daily and vancomycin per pharmacy Symptomatic support: Morphine 2 mg IV every 4 hours as needed for moderate pain, morphine 4 mg IV every 4 hours as needed for severe pain, 20 hours of coverage ordered.  AM team to reevaluate  patient at bedside for continued IV pain medication requirements.  Septic joint (HCC) Suspected septic joint Ceftriaxone 2 g IV daily, vancomycin per pharmacy  Hypertension associated with diabetes (HCC) Hydralazine  5 mg  IV every 6 hours as needed for SBP > 170, 5 days ordered  DM (diabetes mellitus), type 2 with renal complications (HCC) Non-insulin -dependent diabetes mellitus type 2 Home metformin  will not be resumed on admission Insulin  SSI with at bedtime coverage, renal dosing ordered Goal inpatient blood glucose levels 140-180  CKD stage 3a, GFR 45-59 ml/min (HCC) At baseline  Chart reviewed.   DVT prophylaxis: Pharmacologic DVT not initiated on admission.  AM team to initiate pharmacologic DVT when the benefits outweigh the risk.  Code Status: Full code Diet: Heart healthy/carb modified Family Communication: A phone call was offered, patient declined Disposition Plan: Pending clinical course Consults called: Orthopedic specialist Admission status: Telemetry, inpatient  Past Medical History:  Diagnosis Date   Allergy    Asthma with COPD (HCC)    Complication of anesthesia    Difficulty waking after Spinal injection (2006)   Depression    daughter was very sick, depression began during this period of time    Diabetes mellitus    Esophagitis    Fibromyalgia    GERD (gastroesophageal reflux disease)    Glaucoma    History of colon polyps    HTN (hypertension)    Hyperlipidemia    Lichen sclerosus    Migraine    Perforating folliculitis    Spinal stenosis    Tachycardia    TIA (transient ischemic attack)    Past Surgical History:  Procedure Laterality Date   ABDOMINAL HYSTERECTOMY     right ovary remaining   APPENDECTOMY     BREAST CYST ASPIRATION Bilateral    BREAST EXCISIONAL BIOPSY Right 1982   NEG   BREAST SURGERY     right(benign)   CARPAL TUNNEL RELEASE     CESAREAN SECTION     COLONOSCOPY WITH PROPOFOL  N/A 06/29/2021   Procedure: COLONOSCOPY WITH PROPOFOL ;  Surgeon: Jinny Carmine, MD;  Location: [facility];  Service: Endoscopy;  Laterality: N/A;  Diabetic   EPIDURAL BLOCK INJECTION  2013   ESOPHAGOGASTRODUODENOSCOPY (EGD) WITH PROPOFOL  N/A 06/29/2021    Procedure: ESOPHAGOGASTRODUODENOSCOPY (EGD) WITH PROPOFOL ;  Surgeon: Jinny Carmine, MD;  Location: [facility];  Service: Endoscopy;  Laterality: N/A;   KNEE ARTHROSCOPY W/ MENISCAL REPAIR     left   LIPOMA EXCISION     2 times left leg   lumpectomy     TONSILLECTOMY     TRIGGER FINGER RELEASE     Social History:  reports that she quit smoking about 35 years ago. Her smoking use included cigarettes. She has never used smokeless tobacco. She reports that she does not drink alcohol and does not use drugs.  Allergies  Allergen Reactions   Amoxicillin-Pot Clavulanate    Codeine     REACTION: Gives her migraines   Diclofenac  Other (See Comments)    Patient states it made her hallucinate   Hydrocodone-Acetaminophen     Lovastatin     REACTION: myalgias   Soma [Carisoprodol] Other (See Comments)    Confussion   Sulfonamide Derivatives     REACTION: Rash   Tizanidine     hallucination   Sulfa Antibiotics Rash    REACTION: Rash   Tape Rash   Family History  Problem Relation Age of Onset   Cancer Mother        breast  Stroke Mother    Valvular heart disease Mother    Breast cancer Mother 46   Alzheimer's disease Mother    Kidney disease Father    Stroke Father    Coronary artery disease Father    Parkinson's disease Father    Diabetes Brother    Alzheimer's disease Maternal Grandmother    Diabetes Maternal Aunt    Alzheimer's disease Maternal Aunt    Alzheimer's disease Maternal Aunt    Colon cancer Neg Hx    Pancreatic cancer Neg Hx    Stomach cancer Neg Hx    Family history: Family history reviewed and not pertinent.  Prior to Admission medications   Medication Sig Start Date End Date Taking? Authorizing Provider  Accu-Chek Softclix Lancets lancets TEST BLOOD SUGAR TWICE DAILY 12/27/21   Bedsole, Liesl Simons E, MD  Alcohol Swabs (DROPSAFE ALCOHOL PREP) 70 % PADS USE TWO TIMES DAILY 12/26/23   Bedsole, Chalise Pe E, MD  Blood Glucose Calibration (ACCU-CHEK AVIVA) SOLN Use to  check controls on glucose meter 08/20/20   Bedsole, Danajah Birdsell E, MD  Blood Glucose Monitoring Suppl (ACCU-CHEK AVIVA PLUS) w/Device KIT Use to check blood sugar two times a day 08/20/20   Bedsole, Koben Daman E, MD  brimonidine-timolol  (COMBIGAN) 0.2-0.5 % ophthalmic solution Apply 1 drop to eye 2 (two) times daily. 03/30/24   [provider]  desipramine  (NORPRAMIN ) 25 MG tablet Take 1 tablet (25 mg total) by mouth at bedtime. 01/12/24   Bedsole, Gael Delude E, MD  ezetimibe  (ZETIA ) 10 MG tablet Take 1 tablet (10 mg total) by mouth daily. 04/10/24 04/10/25  Lorene Sinclair L, PA-C  glucose blood (ACCU-CHEK AVIVA PLUS) test strip TEST BLOOD SUGAR TWICE DAILY 01/16/24   Bedsole, Manu Rubey E, MD  hydrocortisone  (ANUSOL -HC) 2.5 % rectal cream Place 1 Application rectally 2 (two) times daily. 03/20/22   Unk Corinn Skiff, MD  latanoprost  (XALATAN ) 0.005 % ophthalmic solution Place 1 drop into both eyes at bedtime.    [provider]  metFORMIN  (GLUCOPHAGE -XR) 500 MG 24 hr tablet Take 1 tablet (500 mg total) by mouth 2 (two) times daily with a meal. 03/28/24   Bedsole, Adriena Manfre E, MD  metoprolol  tartrate (LOPRESSOR ) 25 MG tablet Take 0.5 tablets (12.5 mg total) by mouth 2 (two) times daily. 05/25/24   Avelina Greig BRAVO, MD  Multiple Vitamins-Minerals (PRESERVISION AREDS PO) Take by mouth.    [provider]   Physical Exam: Vitals:   06/10/24 1300 06/10/24 1330 06/10/24 1430 06/10/24 1744  BP: 132/78 (!) 116/48 (!) 161/136 (!) 134/106  Pulse:   88 73  Resp:   18 16  Temp:    99.1 F (37.3 C)  TempSrc:    Oral  SpO2:   99% 100%  Weight:      Height:       Constitutional: appears age-appropriate, NAD, calm Eyes: PERRL, lids and conjunctivae normal ENMT: Mucous membranes are moist. Posterior pharynx clear of any exudate or lesions. Age-appropriate dentition. Hearing appropriate Neck: normal, supple, no masses, no thyromegaly Respiratory: clear to auscultation bilaterally, no wheezing, no crackles. Normal respiratory  effort. No accessory muscle use.  Cardiovascular: Regular rate and rhythm, no murmurs / rubs / gallops. No extremity edema. 2+ pedal pulses. No carotid bruits.  Abdomen: no tenderness, no masses palpated, no hepatosplenomegaly. Bowel sounds positive.  Musculoskeletal: no clubbing / cyanosis. No joint deformity upper and lower extremities.  Decreased ROM of the right knee, no contractures, no atrophy. Normal muscle tone.  Skin: no rashes, lesions, ulcers.  No induration.  Skin over the right knee has increased warmth Neurologic: Sensation intact. Strength 5/5 in all 4.  Psychiatric: Normal judgment and insight. Alert and oriented x 3. Normal mood.   EKG: Not indicated at this time  X-ray on Admission: I personally reviewed and I agree with radiologist reading as below.  DG Knee Complete 4 Views Right Result Date: 06/10/2024 CLINICAL DATA:  Right knee pain and swelling for 1 week. EXAM: RIGHT KNEE - COMPLETE 4+ VIEW COMPARISON:  None Available. FINDINGS: No evidence of fracture or dislocation. Moderate knee joint effusion is seen. Mild degenerative spurring of tibial spines, and enthesopathic change of the patella noted. IMPRESSION: Moderate knee joint effusion. No evidence of fracture or dislocation. Electronically Signed   By: Norleen DELENA Kil M.D.   On: 06/10/2024 08:41   Labs on Admission: I have personally reviewed following labs  CBC: Recent Labs  Lab 06/10/24 0754  WBC 16.1*  NEUTROABS 11.7*  HGB 11.4*  HCT 33.3*  MCV 87.4  PLT 318   Basic Metabolic Panel: Recent Labs  Lab 06/10/24 0754  NA 134*  K 3.7  CL 98  CO2 23  GLUCOSE 168*  BUN 22  CREATININE 1.14*  CALCIUM 9.0   GFR: Estimated Creatinine Clearance: 37 mL/min (A) (by C-G formula based on SCr of 1.14 mg/dL (H)).  Liver Function Tests: Recent Labs  Lab 06/10/24 0754  AST 25  ALT 10  ALKPHOS 52  BILITOT 0.7  PROT 7.1  ALBUMIN 3.3*   CBG: Recent Labs  Lab 06/10/24 0757 06/10/24 1601 06/10/24 1747   GLUCAP 155* 154* 119*   Urine analysis:    Component Value Date/Time   COLORURINE yellow 10/02/2010 1517   APPEARANCEUR Clear 10/02/2010 1517   LABSPEC 1.015 10/02/2010 1517   PHURINE 6.0 10/02/2010 1517   HGBUR negative 10/02/2010 1517   BILIRUBINUR Negative 03/27/2024 1537   PROTEINUR Negative 03/27/2024 1537   UROBILINOGEN 0.2 03/27/2024 1537   UROBILINOGEN 0.2 10/02/2010 1517   NITRITE Negative 03/27/2024 1537   NITRITE negative 10/02/2010 1517   LEUKOCYTESUR Negative 03/27/2024 1537   This document was prepared using Dragon Voice Recognition software and may include unintentional dictation errors.  Dr. Sherre [facility]  If 7PM-7AM, please contact overnight-coverage provider If 7AM-7PM, please contact day attending provider [email or web address]  06/10/2024, 5:57 PM

## 2024-06-10 NOTE — ED Notes (Signed)
 Advised nurse that patient has ready bed

## 2024-06-10 NOTE — Assessment & Plan Note (Signed)
 Slight increase in creatinine today but remained within baseline.   -Monitor renal function -Avoid nephrotoxins

## 2024-06-10 NOTE — ED Provider Notes (Signed)
 [facility] Provider Note    Event Date/Time   First MD Initiated Contact with Patient 06/10/24 [id number]     (approximate)   History   Knee Pain   HPI  Cathy Mullins is a 78 y.o. female past medical history significant for diabetes, esophagitis, fibromyalgia, hypertension, who presents to the emergency department with knee pain.  States a couple of days ago she stood up from a chair and it felt funny in her knee.  States that it then started swelling.  States that it continues to swell.  Denies any falls.  States that she did not twisted or injure it.  No similar episodes in the past.  Does state that she has had multiple issues with her knee throughout the years, she is uncertain if she has had surgery on her knee and states that she has got some mild dementia.  Denies fever or chills.  No history of gout or pseudogout.     Physical Exam   Triage Vital Signs: ED Triage Vitals  Encounter Vitals Group     BP 06/10/24 0751 132/65     Girls Systolic BP Percentile --      Girls Diastolic BP Percentile --      Boys Systolic BP Percentile --      Boys Diastolic BP Percentile --      Pulse Rate 06/10/24 0751 78     Resp 06/10/24 0751 20     Temp 06/10/24 0751 98.5 F (36.9 C)     Temp Source 06/10/24 0751 Oral     SpO2 06/10/24 0751 100 %     Weight 06/10/24 0750 167 lb 1.7 oz (75.8 kg)     Height 06/10/24 0750 5' (1.524 m)     Head Circumference --      Peak Flow --      Pain Score 06/10/24 0750 8     Pain Loc --      Pain Education --      Exclude from Growth Chart --     Most recent vital signs: Vitals:   06/10/24 1130 06/10/24 1254  BP: 104/79   Pulse: 76   Resp: 16   Temp:  98.4 F (36.9 C)  SpO2: 100%     Physical Exam Constitutional:      Appearance: She is well-developed.  HENT:     Head: Atraumatic.  Eyes:     Conjunctiva/sclera: Conjunctivae normal.  Cardiovascular:     Rate and Rhythm: Regular rhythm.  Pulmonary:     Effort:  No respiratory distress.  Abdominal:     General: There is no distension.  Musculoskeletal:        General: Normal range of motion.     Cervical back: Normal range of motion.     Comments: Right knee with joint effusion with no overlying erythema warmth or induration.  Does have significant pain with range of motion but able to range right knee.  +2 DP pulses.  Skin:    General: Skin is warm.     Capillary Refill: Capillary refill takes less than 2 seconds.  Neurological:     Mental Status: She is alert. Mental status is at baseline.     IMPRESSION / MDM / ASSESSMENT AND PLAN / ED COURSE  I reviewed the triage vital signs and the nursing notes.  Differential diagnosis including septic joint, gout, pseudogout, cellulitis.  Possible trauma that was not remembered given her mild dementia with ligamentous injury  or meniscus tear.   RADIOLOGY I independently reviewed imaging, my interpretation of imaging: Right knee x-ray with findings concerning for joint effusion.  No obvious fracture.  LABS (all labs ordered are listed, but only abnormal results are displayed) Labs interpreted as -    Labs Reviewed  LACTIC ACID, PLASMA - Abnormal; Notable for the following components:      Result Value   Lactic Acid, Venous 2.5 (*)    All other components within normal limits  COMPREHENSIVE METABOLIC PANEL WITH GFR - Abnormal; Notable for the following components:   Sodium 134 (*)    Glucose, Bld 168 (*)    Creatinine, Ser 1.14 (*)    Albumin 3.3 (*)    GFR, Estimated 49 (*)    All other components within normal limits  CBC WITH DIFFERENTIAL/PLATELET - Abnormal; Notable for the following components:   WBC 16.1 (*)    RBC 3.81 (*)    Hemoglobin 11.4 (*)    HCT 33.3 (*)    Neutro Abs 11.7 (*)    Monocytes Absolute 2.1 (*)    All other components within normal limits  SEDIMENTATION RATE - Abnormal; Notable for the following components:   Sed Rate 47 (*)    All other components within  normal limits  SYNOVIAL CELL COUNT + DIFF, W/ CRYSTALS - Abnormal; Notable for the following components:   Appearance-Synovial CLOUDY (*)    WBC, Synovial 26,775 (*)    Neutrophil, Synovial 90 (*)    Monocyte-Macrophage-Synovial Fluid 10 (*)    All other components within normal limits  CBG MONITORING, ED - Abnormal; Notable for the following components:   Glucose-Capillary 155 (*)    All other components within normal limits  BODY FLUID CULTURE W GRAM STAIN  CULTURE, BLOOD (ROUTINE X 2)  CULTURE, BLOOD (ROUTINE X 2)  LACTIC ACID, PLASMA  C-REACTIVE PROTEIN  PROCALCITONIN     MDM   Patient with leukocytosis of 16.1.  Elevated lactic acid of 2.5 and on repeat is normal.  Creatinine appears to be at her baseline with no significant electrolyte abnormality.  Arthrocentesis with 27,000 WBCs and elevated neutrophil count.  No crystals noted on exam.  Discussed the patient's case with Dr. Edie who is on-call for orthopedics, synovial fluid is not obviously consistent with a septic joint but does have concerns for possible septic joint.  Recommended adding on a procalcitonin and starting on IV antibiotics and steroids and admitting to the hospitalist service.  If continues to have ongoing symptoms and no improvement may need to washout tomorrow.  Consulted hospitalist for admission for workup for septic joint      PROCEDURES:  Critical Care performed: yes  .Joint Aspiration/Arthrocentesis  Date/Time: 06/10/2024 9:04 AM  Performed by: Suzanne Kirsch, MD Authorized by: Suzanne Kirsch, MD   Consent:    Consent obtained:  Verbal   Consent given by:  Patient   Risks, benefits, and alternatives were discussed: yes     Risks discussed:  Bleeding, infection, pain, nerve damage, incomplete drainage and poor cosmetic result   Alternatives discussed:  Delayed treatment Universal protocol:    Procedure explained and questions answered to patient or proxy's satisfaction: yes      Relevant documents present and verified: yes     Test results available: yes     Imaging studies available: yes     Required blood products, implants, devices, and special equipment available: yes     Site/side marked: yes     Patient identity  confirmed:  Verbally with patient, arm band and hospital-assigned identification number Anesthesia:    Anesthesia method:  Local infiltration   Local anesthetic:  Lidocaine  1% w/o epi Procedure details:    Preparation: Patient was prepped and draped in usual sterile fashion     Needle gauge:  18 G   Ultrasound guidance: no     Approach:  Inferior   Aspirate amount:  20   Aspirate characteristics:  Serous   Steroid injected: no   Post-procedure details:    Dressing:  Adhesive bandage   Procedure completion:  Tolerated well, no immediate complications .Critical Care  Performed by: Suzanne Kirsch, MD Authorized by: Suzanne Kirsch, MD   Critical care provider statement:    Critical care time (minutes):  30   Critical care time was exclusive of:  Separately billable procedures and treating other patients   Critical care was time spent personally by me on the following activities:  Development of treatment plan with patient or surrogate, discussions with consultants, evaluation of patient's response to treatment, examination of patient, ordering and review of laboratory studies, ordering and review of radiographic studies, ordering and performing treatments and interventions, pulse oximetry, re-evaluation of patient's condition and review of old charts   Patient's presentation is most consistent with acute presentation with potential threat to life or bodily function.   MEDICATIONS ORDERED IN ED: Medications  vancomycin (VANCOCIN) IVPB 1000 mg/200 mL premix (has no administration in time range)  cefTRIAXone (ROCEPHIN) 2 g in sodium chloride  0.9 % 100 mL IVPB (has no administration in time range)  morphine (PF) 4 MG/ML injection 4 mg (4 mg  Intravenous Given 06/10/24 0835)  lidocaine  (XYLOCAINE ) 1 % (with pres) injection 30 mL (30 mLs Other Given by Other 06/10/24 9167)    FINAL CLINICAL IMPRESSION(S) / ED DIAGNOSES   Final diagnoses:  Effusion of right knee     Rx / DC Orders   ED Discharge Orders     None        Note:  This document was prepared using Dragon voice recognition software and may include unintentional dictation errors.   Suzanne Kirsch, MD 06/10/24 1302

## 2024-06-10 NOTE — Hospital Course (Addendum)
 Ms. Keyonta Madrid is a 78 year old female with history of non-insulin -dependent diabetes mellitus type 2, hypertension, fibromyalgia, hyperlipidemia, who presents ED for chief concerns of right knee pain.  Vitals in the ED showed t 98.4, rr 16, hr 76, blood pressure 104/79, SpO2 100% on room air.  Serum sodium is 134, potassium 3.7, chloride 98, bicarb 23, BUN of 22, serum creatinine 1.14, eGFR 49, nonfasting blood glucose 168, WBC 16.1, hemoglobin 11.4, platelets of 318. Lactic acid was initially 2.5 and on repeat is 1.3.  CRP is 12.9, sed rate 47, synovial joint: Cloudy, WBC 26,775, and neutrophil in the synovial was 90%.  Sed rate was 47, CRP 12.9.  ED treatment: Ceftriaxone 2, IV one-time dose, vancomycin per pharmacy, lidocaine , morphine 4 mg.  Patient received arthrocentesis via EDP.  EDP consulted orthopedic provider who states he will evaluate patient given the synovial fluid not entirely consistent with septic joint.  Continue IV antibiotic and steroids and if symptoms continue, can have washout with orthopedic service tomorrow.  11/3: Vitals with mild tachycardia and mildly elevated blood pressure, small improvement in leukocytosis now at 14.3, small worsening of creatinine but remained within her baseline.  Joint fluid cultures with no growth in 24 hours Going for joint washout with orthopedic surgery today.  11/4: Hemodynamically stable, s/p arthroscopy and washout with orthopedic surgery on 11/3, wound VAC was applied.  Preliminary cultures with no growth in less than 12-hour. Hemoglobin with some decreased to 10.7 and leukocytosis at 14.8.  11/5: Remained hemodynamically stable, wound VAC was removed.  Slowly improving leukocytosis with stable hemoglobin now.  Patient received ceftriaxone and linezolid while in the hospital and is being discharged on cefadroxil and doxycycline  for 4 more weeks after discussing with orthopedic surgery.  Although cultures remain negative with pending  pathology results, there is enough evidence that 70% of septic joint cultures comes back negative.  Orthopedic surgery will have a close follow-up and make changes to her medications as appropriate.  She was also started on low-dose amlodipine due to elevated blood pressure.  Her PCP can take over and make adjustment as needed.  Patient will continue on current medications and need to have a close follow-up with her providers for further management.

## 2024-06-10 NOTE — Assessment & Plan Note (Signed)
 Non-insulin -dependent diabetes mellitus type 2 Home metformin  will not be resumed on admission Insulin  SSI with at bedtime coverage, renal dosing ordered Goal inpatient blood glucose levels 140-180

## 2024-06-10 NOTE — Assessment & Plan Note (Signed)
 Hydralazine  5 mg IV every 6 hours as needed for SBP > 170, 5 days ordered

## 2024-06-10 NOTE — Assessment & Plan Note (Signed)
 Suspected septic joint Ceftriaxone 2 g IV daily, vancomycin per pharmacy

## 2024-06-10 NOTE — Progress Notes (Signed)
 Pharmacy Antibiotic Note  Cathy Mullins is a 78 y.o. female admitted on 06/10/2024 with septic joint/wound infection. PMH significant for DM, esophagitis, fibromyalgia, and HTN. Pharmacy has been consulted for Vancomycin dosing.  Plan: - Will give Vancomycin 1750 g IV loading dose x 1  - Will start Vancomycin maintenance dose at 750 mg IV q24h (eAUC 479.7, Cmin 14.3, Scr 1.14. IBW used, Vd 0.72 L/kg) - Goal AUC 400-550 - Will continue to monitor renal function and signs of clinical improvement  Height: 5' (152.4 cm) Weight: 75.8 kg (167 lb 1.7 oz) IBW/kg (Calculated) : 45.5  Temp (24hrs), Avg:98.5 F (36.9 C), Min:98.4 F (36.9 C), Max:98.5 F (36.9 C)  Recent Labs  Lab 06/10/24 0754 06/10/24 0918  WBC 16.1*  --   CREATININE 1.14*  --   LATICACIDVEN 2.5* 1.3    Estimated Creatinine Clearance: 37 mL/min (A) (by C-G formula based on SCr of 1.14 mg/dL (H)).    Allergies  Allergen Reactions   Amoxicillin-Pot Clavulanate    Codeine     REACTION: Gives her migraines   Diclofenac  Other (See Comments)    Patient states it made her hallucinate   Hydrocodone-Acetaminophen     Lovastatin     REACTION: myalgias   Soma [Carisoprodol] Other (See Comments)    Confussion   Sulfonamide Derivatives     REACTION: Rash   Tizanidine     hallucination   Sulfa Antibiotics Rash    REACTION: Rash   Tape Rash    Antimicrobials this admission: Ceftriaxone 11/3 >>  Vancomycin 11/3 >>   Dose adjustments this admission:   Microbiology results: 11/3 BCx: sent 11/2 Synovial Fluid Cx: sent   Thank you for allowing pharmacy to be a part of this patient's care.    Ransom Blanch PGY-1 Pharmacy Resident  [facility]  06/10/2024 2:13 PM

## 2024-06-10 NOTE — ED Triage Notes (Signed)
 BIB EMS complaining of right knee pain and swelling.  6days ago stood up from chair and felt pain and has got worse since. Right knee is extremely swollen but no redness or hot to touch.  Bilateral pedal pulses present.   EMS gave 1 gram tylenol 

## 2024-06-10 NOTE — ED Notes (Signed)
 Attemtped blood cultures.  Unsucessful x 2  Lab called to collect

## 2024-06-11 ENCOUNTER — Inpatient Hospital Stay: Admitting: Certified Registered Nurse Anesthetist

## 2024-06-11 ENCOUNTER — Encounter
Admission: EM | Disposition: A | Discharge status: Home Health | Payer: Self-pay | Source: Home / Self Care | Attending: Internal Medicine

## 2024-06-11 DIAGNOSIS — E1122 Type 2 diabetes mellitus with diabetic chronic kidney disease: Secondary | ICD-10-CM | POA: Diagnosis not present

## 2024-06-11 DIAGNOSIS — E1169 Type 2 diabetes mellitus with other specified complication: Secondary | ICD-10-CM

## 2024-06-11 DIAGNOSIS — E785 Hyperlipidemia, unspecified: Secondary | ICD-10-CM

## 2024-06-11 DIAGNOSIS — K219 Gastro-esophageal reflux disease without esophagitis: Secondary | ICD-10-CM

## 2024-06-11 DIAGNOSIS — M25561 Pain in right knee: Secondary | ICD-10-CM | POA: Diagnosis not present

## 2024-06-11 DIAGNOSIS — I152 Hypertension secondary to endocrine disorders: Secondary | ICD-10-CM

## 2024-06-11 DIAGNOSIS — M009 Pyogenic arthritis, unspecified: Secondary | ICD-10-CM | POA: Diagnosis not present

## 2024-06-11 DIAGNOSIS — E1159 Type 2 diabetes mellitus with other circulatory complications: Secondary | ICD-10-CM

## 2024-06-11 DIAGNOSIS — N1831 Chronic kidney disease, stage 3a: Secondary | ICD-10-CM

## 2024-06-11 HISTORY — PX: KNEE ARTHROSCOPY: SHX127

## 2024-06-11 LAB — GLUCOSE, CAPILLARY
Glucose-Capillary: 119 mg/dL — ABNORMAL HIGH (ref 70–99)
Glucose-Capillary: 120 mg/dL — ABNORMAL HIGH (ref 70–99)
Glucose-Capillary: 106 mg/dL — ABNORMAL HIGH (ref 70–99)
Glucose-Capillary: 124 mg/dL — ABNORMAL HIGH (ref 70–99)
Glucose-Capillary: 139 mg/dL — ABNORMAL HIGH (ref 70–99)
Glucose-Capillary: 263 mg/dL — ABNORMAL HIGH (ref 70–99)

## 2024-06-11 LAB — CBC
HCT: 38.5 % (ref 36.0–46.0)
Hemoglobin: 12.8 g/dL (ref 12.0–15.0)
MCH: 29.6 pg (ref 26.0–34.0)
MCHC: 33.2 g/dL (ref 30.0–36.0)
MCV: 89.1 fL (ref 80.0–100.0)
Platelets: 337 K/uL (ref 150–400)
RBC: 4.32 MIL/uL (ref 3.87–5.11)
RDW: 13.2 % (ref 11.5–15.5)
WBC: 14.3 K/uL — ABNORMAL HIGH (ref 4.0–10.5)
nRBC: 0 % (ref 0.0–0.2)

## 2024-06-11 LAB — BASIC METABOLIC PANEL WITH GFR
Anion gap: 14 (ref 5–15)
BUN: 21 mg/dL (ref 8–23)
CO2: 21 mmol/L — ABNORMAL LOW (ref 22–32)
Calcium: 9.2 mg/dL (ref 8.9–10.3)
Chloride: 100 mmol/L (ref 98–111)
Creatinine, Ser: 1.22 mg/dL — ABNORMAL HIGH (ref 0.44–1.00)
GFR, Estimated: 45 mL/min — ABNORMAL LOW (ref >60)
Glucose, Bld: 131 mg/dL — ABNORMAL HIGH (ref 70–99)
Potassium: 4.5 mmol/L (ref 3.5–5.1)
Sodium: 135 mmol/L (ref 135–145)

## 2024-06-11 SURGERY — ARTHROSCOPY, KNEE
Anesthesia: General | Site: Knee | Laterality: Right

## 2024-06-11 MED ORDER — CHLORHEXIDINE GLUCONATE 0.12 % MT SOLN
OROMUCOSAL | Status: AC
  Filled 2024-06-11: qty 15

## 2024-06-11 MED ORDER — METOPROLOL TARTRATE 25 MG PO TABS
12.5000 mg | ORAL_TABLET | Freq: Two times a day (BID) | ORAL | Status: DC
  Administered 2024-06-11 – 2024-06-13 (×4): 12.5 mg via ORAL
  Filled 2024-06-11 (×4): qty 1

## 2024-06-11 MED ORDER — KETOROLAC TROMETHAMINE 15 MG/ML IJ SOLN
15.0000 mg | Freq: Once | INTRAMUSCULAR | Status: AC
  Administered 2024-06-11: 15 mg via INTRAVENOUS

## 2024-06-11 MED ORDER — FENTANYL CITRATE (PF) 100 MCG/2ML IJ SOLN
INTRAMUSCULAR | Status: DC | PRN
  Administered 2024-06-11 (×2): 25 ug via INTRAVENOUS
  Administered 2024-06-11: 50 ug via INTRAVENOUS

## 2024-06-11 MED ORDER — AMLODIPINE BESYLATE 5 MG PO TABS
5.0000 mg | ORAL_TABLET | Freq: Every day | ORAL | Status: DC
  Administered 2024-06-12 – 2024-06-13 (×2): 5 mg via ORAL
  Filled 2024-06-11 (×2): qty 1

## 2024-06-11 MED ORDER — CHLORHEXIDINE GLUCONATE 0.12 % MT SOLN
15.0000 mL | Freq: Once | OROMUCOSAL | Status: AC
  Administered 2024-06-11: 15 mL via OROMUCOSAL

## 2024-06-11 MED ORDER — FENTANYL CITRATE (PF) 100 MCG/2ML IJ SOLN: 25.0000 ug | INTRAMUSCULAR | Status: DC | PRN

## 2024-06-11 MED ORDER — PROPOFOL 10 MG/ML IV BOLUS
INTRAVENOUS | Status: DC | PRN
  Administered 2024-06-11: 150 mg via INTRAVENOUS

## 2024-06-11 MED ORDER — DEXAMETHASONE SOD PHOSPHATE PF 10 MG/ML IJ SOLN
INTRAMUSCULAR | Status: DC | PRN
  Administered 2024-06-11: 4 mg via INTRAVENOUS

## 2024-06-11 MED ORDER — ONDANSETRON HCL 4 MG/2ML IJ SOLN
INTRAMUSCULAR | Status: AC
  Filled 2024-06-11: qty 2

## 2024-06-11 MED ORDER — ACETAMINOPHEN 10 MG/ML IV SOLN
INTRAVENOUS | Status: DC | PRN
  Administered 2024-06-11: 1000 mg via INTRAVENOUS

## 2024-06-11 MED ORDER — BRIMONIDINE TARTRATE-TIMOLOL 0.2-0.5 % OP SOLN
1.0000 [drp] | Freq: Two times a day (BID) | OPHTHALMIC | Status: DC
  Administered 2024-06-11 – 2024-06-13 (×4): 1 [drp] via OPHTHALMIC
  Filled 2024-06-11: qty 5

## 2024-06-11 MED ORDER — SODIUM CHLORIDE 0.9 % IV SOLN: INTRAVENOUS | Status: AC

## 2024-06-11 MED ORDER — HYDROMORPHONE HCL 1 MG/ML IJ SOLN
INTRAMUSCULAR | Status: AC
  Filled 2024-06-11: qty 1

## 2024-06-11 MED ORDER — BUPIVACAINE-EPINEPHRINE 0.5% -1:200000 IJ SOLN
INTRAMUSCULAR | Status: DC | PRN
  Administered 2024-06-11: 20 mL via SURGICAL_CAVITY

## 2024-06-11 MED ORDER — OXYCODONE HCL 5 MG/5ML PO SOLN: 5.0000 mg | Freq: Once | ORAL | Status: DC | PRN

## 2024-06-11 MED ORDER — BUPIVACAINE-EPINEPHRINE (PF) 0.5% -1:200000 IJ SOLN
INTRAMUSCULAR | Status: AC
  Filled 2024-06-11: qty 30

## 2024-06-11 MED ORDER — EZETIMIBE 10 MG PO TABS
10.0000 mg | ORAL_TABLET | Freq: Every day | ORAL | Status: DC
  Administered 2024-06-12 – 2024-06-13 (×2): 10 mg via ORAL
  Filled 2024-06-11 (×3): qty 1

## 2024-06-11 MED ORDER — HYDROMORPHONE HCL 1 MG/ML IJ SOLN
INTRAMUSCULAR | Status: DC | PRN
  Administered 2024-06-11 (×2): .25 mg via INTRAVENOUS

## 2024-06-11 MED ORDER — ONDANSETRON HCL 4 MG/2ML IJ SOLN
INTRAMUSCULAR | Status: DC | PRN
  Administered 2024-06-11: 4 mg via INTRAVENOUS

## 2024-06-11 MED ORDER — PROPOFOL 10 MG/ML IV BOLUS
INTRAVENOUS | Status: AC
  Filled 2024-06-11: qty 20

## 2024-06-11 MED ORDER — LIDOCAINE HCL (CARDIAC) PF 100 MG/5ML IV SOSY
PREFILLED_SYRINGE | INTRAVENOUS | Status: DC | PRN
  Administered 2024-06-11: 100 mg via INTRAVENOUS

## 2024-06-11 MED ORDER — KETOROLAC TROMETHAMINE 15 MG/ML IJ SOLN
INTRAMUSCULAR | Status: AC
  Filled 2024-06-11: qty 1

## 2024-06-11 MED ORDER — LIDOCAINE HCL (PF) 1 % IJ SOLN
INTRAMUSCULAR | Status: AC
  Filled 2024-06-11: qty 30

## 2024-06-11 MED ORDER — OXYCODONE HCL 5 MG PO TABS: 5.0000 mg | ORAL_TABLET | Freq: Once | ORAL | Status: DC | PRN

## 2024-06-11 MED ORDER — RINGERS IRRIGATION IR SOLN
Status: DC | PRN
  Administered 2024-06-11: 6000 mL

## 2024-06-11 MED ORDER — FENTANYL CITRATE (PF) 100 MCG/2ML IJ SOLN
INTRAMUSCULAR | Status: AC
  Filled 2024-06-11: qty 2

## 2024-06-11 MED ORDER — ACETAMINOPHEN 10 MG/ML IV SOLN
INTRAVENOUS | Status: AC
  Filled 2024-06-11: qty 100

## 2024-06-11 MED ORDER — LIDOCAINE HCL (PF) 2 % IJ SOLN
INTRAMUSCULAR | Status: AC
  Filled 2024-06-11: qty 5

## 2024-06-11 SURGICAL SUPPLY — 34 items
BLADE FULL RADIUS 3.5 (BLADE) ×1 IMPLANT
BLADE SHAVER 4.5X7 STR FR (MISCELLANEOUS) ×1 IMPLANT
BNDG ELASTIC 6INX 5YD STR LF (GAUZE/BANDAGES/DRESSINGS) ×1 IMPLANT
BNDG ESMARCH 6X12 STRL LF (GAUZE/BANDAGES/DRESSINGS) ×1 IMPLANT
CATH ROBINSON RED A/P 12FR (CATHETERS) IMPLANT
CHLORAPREP W/TINT 26 (MISCELLANEOUS) ×1 IMPLANT
CNTNR URN SCR LID CUP LEK RST (MISCELLANEOUS) IMPLANT
CUFF TRNQT CYL 24X4X16.5-23 (TOURNIQUET CUFF) IMPLANT
CUFF TRNQT CYL 34X4.125X (TOURNIQUET CUFF) IMPLANT
CUP MEDICINE 2OZ PLAST GRAD ST (MISCELLANEOUS) ×1 IMPLANT
DRAPE ARTHROSCOPY W/POUCH 90 (DRAPES) ×1 IMPLANT
DRAPE IMP U-DRAPE 54X76 (DRAPES) ×1 IMPLANT
ELECTRODE REM PT RTRN 9FT ADLT (ELECTROSURGICAL) ×1 IMPLANT
EVACUATOR 1/8 PVC DRAIN (DRAIN) IMPLANT
GAUZE SPONGE 4X4 12PLY STRL (GAUZE/BANDAGES/DRESSINGS) ×1 IMPLANT
GLOVE BIO SURGEON STRL SZ8 (GLOVE) ×2 IMPLANT
GLOVE INDICATOR 8.0 STRL GRN (GLOVE) ×1 IMPLANT
GOWN STRL REUS W/ TWL LRG LVL3 (GOWN DISPOSABLE) ×1 IMPLANT
GOWN STRL REUS W/ TWL XL LVL3 (GOWN DISPOSABLE) ×2 IMPLANT
IV LR IRRIG 3000ML ARTHROMATIC (IV SOLUTION) ×1 IMPLANT
KIT TURNOVER KIT A (KITS) ×1 IMPLANT
MANIFOLD NEPTUNE II (INSTRUMENTS) ×2 IMPLANT
NDL HYPO 21X1.5 SAFETY (NEEDLE) ×1 IMPLANT
NEEDLE HYPO 21X1.5 SAFETY (NEEDLE) ×1 IMPLANT
PACK ARTHROSCOPY KNEE (MISCELLANEOUS) ×1 IMPLANT
SUT PROLENE 4 0 PS 2 18 (SUTURE) ×1 IMPLANT
SYR 30ML LL (SYRINGE) ×1 IMPLANT
SYR 50ML LL SCALE MARK (SYRINGE) ×1 IMPLANT
SYR TOOMEY 50ML (SYRINGE) IMPLANT
TISSUE GRAFT COLLECTOR (SYSTAGENIX WOUND MANAGEMENT) IMPLANT
TRAP FLUID SMOKE EVACUATOR (MISCELLANEOUS) ×1 IMPLANT
TUBE SET DOUBLEFLO INFLOW (TUBING) ×1 IMPLANT
WAND WEREWOLF FLOW 90D (MISCELLANEOUS) ×1 IMPLANT
WATER STERILE IRR 500ML POUR (IV SOLUTION) ×1 IMPLANT

## 2024-06-11 NOTE — Transfer of Care (Signed)
 Immediate Anesthesia Transfer of Care Note  Patient: Cathy Mullins  Procedure(s) Performed: ARTHROSCOPY, KNEE (Right: Knee)  Patient Location: PACU  Anesthesia Type:General  Level of Consciousness: awake, drowsy, and patient cooperative  Airway & Oxygen Therapy: Patient Spontanous Breathing  Post-op Assessment: Report given to RN and Post -op Vital signs reviewed and stable  Post vital signs: Reviewed and stable  Last Vitals:  Vitals Value Taken Time  BP 158/70 06/11/24 15:10  Temp    Pulse 94 06/11/24 15:13  Resp 18 06/11/24 15:13  SpO2 95 % 06/11/24 15:13  Vitals shown include unfiled device data.  Last Pain:  Vitals:   06/11/24 1250  TempSrc: Temporal  PainSc: 8          Complications: No notable events documented.

## 2024-06-11 NOTE — Progress Notes (Signed)
 Progress Note   Patient: Cathy Mullins FMW:[id number] DOB: 08/31/1945 DOA: 06/10/2024     1 DOS: the patient was seen and examined on 06/11/2024   Brief hospital course: Cathy Mullins is a 78 year old female with history of non-insulin -dependent diabetes mellitus type 2, hypertension, fibromyalgia, hyperlipidemia, who presents ED for chief concerns of right knee pain.  Vitals in the ED showed t 98.4, rr 16, hr 76, blood pressure 104/79, SpO2 100% on room air.  Serum sodium is 134, potassium 3.7, chloride 98, bicarb 23, BUN of 22, serum creatinine 1.14, eGFR 49, nonfasting blood glucose 168, WBC 16.1, hemoglobin 11.4, platelets of 318. Lactic acid was initially 2.5 and on repeat is 1.3.  CRP is 12.9, sed rate 47, synovial joint: Cloudy, WBC 26,775, and neutrophil in the synovial was 90%.  Sed rate was 47, CRP 12.9.  ED treatment: Ceftriaxone 2, IV one-time dose, vancomycin per pharmacy, lidocaine , morphine 4 mg.  Patient received arthrocentesis via EDP.  EDP consulted orthopedic provider who states he will evaluate patient given the synovial fluid not entirely consistent with septic joint.  Continue IV antibiotic and steroids and if symptoms continue, can have washout with orthopedic service tomorrow.  11/3: Vitals with mild tachycardia and mildly elevated blood pressure, small improvement in leukocytosis now at 14.3, small worsening of creatinine but remained within her baseline.  Joint fluid cultures with no growth in 24 hours Going for joint washout with orthopedic surgery today.  Assessment and Plan: * Right knee pain High suspicion for septic joint Status post arthrocentesis per EDP Synovial cell count show WBC is 26,775 with neutrophil of 90% Does not quite meet criteria for septic joint per orthopedic surgeon - Preliminary synovial fluid cultures with no growth -Going to the OR for washout with orthopedic surgery today -Continue with antibiotics -Continue with supportive  care  Septic joint (HCC) Suspected septic joint based on synovial fluid cell count but preliminary cultures negative -Continue ceftriaxone 2 g IV daily, vancomycin per pharmacy  Hypertension associated with diabetes (HCC) Blood pressure significantly elevated.  Patient was not on any significant antihypertensives at home except low-dose metoprolol . - Continuing home metoprolol  -Add amlodipine 5 mg-Will need dose titration -As needed hydralazine   DM (diabetes mellitus), type 2 with renal complications (HCC) Non-insulin -dependent diabetes mellitus type 2 Home metformin  will not be resumed on admission Insulin  SSI with at bedtime coverage, renal dosing ordered Goal inpatient blood glucose levels 140-180  CKD stage 3a, GFR 45-59 ml/min (HCC) Slight increase in creatinine today but remained within baseline.   -Monitor renal function -Avoid nephrotoxins  Hyperlipidemia associated with type 2 diabetes mellitus (HCC) Statin allergy noted. - Continue home Zetia    Subjective: Patient was still having some right knee pain, still swollen but no erythema.  Physical Exam: Vitals:   06/10/24 2002 06/11/24 0102 06/11/24 0420 06/11/24 1250  BP: (!) 152/64  (!) 146/93 (!) 186/75  Pulse: 84  (!) 105 (!) 106  Resp: 15  16 18   Temp: 99.9 F (37.7 C) 98.3 F (36.8 C) 99.5 F (37.5 C) 98.8 F (37.1 C)  TempSrc:  Oral  Temporal  SpO2: 98%  98% 95%  Weight:    72.6 kg  Height:    5' (1.524 m)   General.  Well-developed lady, in no acute distress. Pulmonary.  Lungs clear bilaterally, normal respiratory effort. CV.  Regular rate and rhythm, no JVD, rub or murmur. Abdomen.  Soft, nontender, nondistended, BS positive. CNS.  Alert and oriented .  No focal  neurologic deficit. Extremities.  Right knee edema, no erythema or hyperthermia Psychiatry.  Judgment and insight appears normal.   Data Reviewed: Prior data reviewed  Family Communication: Discussed with patient  Disposition: Status  is: Inpatient Remains inpatient appropriate because: Severity of illness  Planned Discharge Destination: Home  Time spent: 50 minutes  This record has been created using Conservation officer, historic buildings. Errors have been sought and corrected,but may not always be located. Such creation errors do not reflect on the standard of care.   Author: Amaryllis Dare, MD 06/11/2024 1:41 PM  For on call review [email or web address].

## 2024-06-11 NOTE — Op Note (Signed)
 06/11/2024  3:09 PM  Patient:   Cathy Mullins  Pre-Op Diagnosis:   Probable septic arthritis right knee.  Postoperative diagnosis:   Same with possible focal PVNS of suprapatellar fossa  Procedure:   Arthroscopic irrigation and debridement right knee.  Surgeon:   DOROTHA Reyes Maltos, MD  Anesthesia:   General LMA  Findings:   As above.  There were grade 2 chondromalacial changes involving the patella, the medial condyle, and the medial tibial plateau  Complications:   None  EBL:   20 cc.  Total fluids:   500 cc of crystalloid.  Tourniquet time:   31 minutes at 300 mmHg  Drains:   None  Closure:   4-0 Prolene interrupted sutures.  Brief clinical note:   The patient is a 78 year old female with a 5 to 6-day history of progressively worsening pain and and swelling of her right knee following a fairly trivial event.  Her symptoms worsened over the next several days, prompting her to present to the emergency room.  Laboratory work and knee aspiration were inconclusive for gout versus infection.  Therefore, the patient presents at this time for arthroscopic irrigation debridement of her right knee for presumed septic arthritis.  Procedure:   The patient was brought into the operating room and lain in the supine position.  After adequate general laryngeal mask anesthesia was obtained, a timeout was performed to verify the appropriate side.  The right lower extremity was prepped with ChloraPrep solution before being draped sterilely. Preoperative antibiotics were administered.  A timeout was performed to verify the appropriate surgical site.  The expected portal sites were injected with 0.5% Sensorcaine with epinephrine before the camera was placed in the anterolateral portal and instrumentation performed through the anteromedial portal.  Upon placing the initial trocar, approximately 20 cc of seropurulent material was drained from the knee and also sent for cell count and differential, crystals,  Gram stain, and culture and sensitivity.  The knee was sequentially examined beginning in the suprapatellar pouch, then progressing to the patellofemoral space, the medial gutter and compartment, the notch, and finally the lateral compartment and gutter.  The findings were as described above.  Abundant reactive synovial tissues anteriorly were debrided using the full-radius resector in order to improve visualization.  In addition, more extensive areas of synovitis medially and laterally also were debrided back to stable margins using the full-radius resector.  In the suprapatellar pouch, a large synovial wad measuring approximately 1 x 2 x 3 cm was identified, possibly consistent with a focal PVNS.  This too was debrided in its entirety.  A portion of it was removed with a grasper and sent to pathology for further evaluation and treatment.  Hemostasis was achieved throughout the case using the ArthroCare wand.  A total of 6 L of fluid was used to flush out the joint.  The instruments were removed from the joint after suctioning the excess fluid.   The lateral portal site was closed using 4-0 Prolene interrupted sutures before a single limb to a Hemovac drain was placed through the more medial portal.  This portal was then closed using two additional 4-0 Prolene interrupted sutures.  A sterile bulky dressing was applied to the knee. The patient was then awakened, extubated, and returned to the recovery room in satisfactory condition after tolerating the procedure well.

## 2024-06-11 NOTE — Plan of Care (Signed)

## 2024-06-11 NOTE — Anesthesia Procedure Notes (Signed)
 Procedure Name: LMA Insertion Date/Time: 06/11/2024 1:50 PM  Performed by: Trudy Rankin LABOR, CRNAPre-anesthesia Checklist: Patient identified, Emergency Drugs available, Suction available, Patient being monitored and Timeout performed Patient Re-evaluated:Patient Re-evaluated prior to induction Oxygen Delivery Method: Circle system utilized Preoxygenation: Pre-oxygenation with 100% oxygen Induction Type: IV induction and Rapid sequence LMA: LMA inserted and LMA with gastric port inserted LMA Size: 3.0 Number of attempts: 1 Placement Confirmation: positive ETCO2 and breath sounds checked- equal and bilateral Tube secured with: Tape Dental Injury: Teeth and Oropharynx as per pre-operative assessment

## 2024-06-11 NOTE — Consult Note (Signed)
 ORTHOPAEDIC CONSULTATION  REQUESTING PHYSICIAN: Caleen Qualia, MD  Chief Complaint:   Right knee pain and swelling.  History of Present Illness: Cathy Mullins is a 78 y.o. female with multiple medical problems including diabetes, asthma with COPD, hypertension, hyperlipidemia, tachycardia, TIA, gastroesophageal reflux disease, depression, and fibromyalgia who normally lives independently.  The patient was in her usual state of health until about 4 to 5 days ago when she went to get up from her chair and felt a small pop in her knee.  The symptoms of pain and swelling in her knee worsened over the next several days, making it quite difficult for her to bear weight.  Therefore, she presented to the emergency room yesterday morning and subsequently was admitted for further evaluation and treatment.  She has remained afebrile throughout her hospital stay, and denies any fevers or chills prior to admission to the hospital.  Her lab work showed an elevated white count of 14.3 thousand with a borderline left shift, while her sed rate and C-reactive team both were somewhat elevated as well.  However, her procalcitonin level was within normal limits.  Knee aspiration demonstrated almost 27,000 white cells with 90% neutrophils, but no crystals.  Past Medical History:  Diagnosis Date   Allergy    Asthma with COPD (HCC)    Complication of anesthesia    Difficulty waking after Spinal injection (2006)   Depression    daughter was very sick, depression began during this period of time    Diabetes mellitus    Esophagitis    Fibromyalgia    GERD (gastroesophageal reflux disease)    Glaucoma    History of colon polyps    HTN (hypertension)    Hyperlipidemia    Lichen sclerosus    Migraine    Perforating folliculitis    Spinal stenosis    Tachycardia    TIA (transient ischemic attack)    Past Surgical History:  Procedure Laterality Date    ABDOMINAL HYSTERECTOMY     right ovary remaining   APPENDECTOMY     BREAST CYST ASPIRATION Bilateral    BREAST EXCISIONAL BIOPSY Right 1982   NEG   BREAST SURGERY     right(benign)   CARPAL TUNNEL RELEASE     CESAREAN SECTION     COLONOSCOPY WITH PROPOFOL  N/A 06/29/2021   Procedure: COLONOSCOPY WITH PROPOFOL ;  Surgeon: Jinny Carmine, MD;  Location: [facility];  Service: Endoscopy;  Laterality: N/A;  Diabetic   EPIDURAL BLOCK INJECTION  2013   ESOPHAGOGASTRODUODENOSCOPY (EGD) WITH PROPOFOL  N/A 06/29/2021   Procedure: ESOPHAGOGASTRODUODENOSCOPY (EGD) WITH PROPOFOL ;  Surgeon: Jinny Carmine, MD;  Location: [facility];  Service: Endoscopy;  Laterality: N/A;   KNEE ARTHROSCOPY W/ MENISCAL REPAIR     left   LIPOMA EXCISION     2 times left leg   lumpectomy     TONSILLECTOMY     TRIGGER FINGER RELEASE     Social History   Socioeconomic History   Marital status: Married    Spouse name: Not on file   Number of children: 1   Years of education: Not on file   Highest education level: Associate degree: academic program  Occupational History   Occupation: retired immunologist estate)    Employer: retired  Tobacco Use   Smoking status: Former    Current packs/day: 0.00    Types: Cigarettes    Quit date: 09/27/1988    Years since quitting: 35.7   Smokeless tobacco: Never  Vaping Use  Vaping status: Never Used  Substance and Sexual Activity   Alcohol use: No    Alcohol/week: 0.0 standard drinks of alcohol   Drug use: No   Sexual activity: Not Currently  Other Topics Concern   Not on file  Social History Narrative   Regular exercise--no   Diet: fruits and veggies, calcium, water , avoids fast food   Right handed   Caffeine use: 1 quart to 1/2 gallon of tea daily                  Social Drivers of Health   Financial Resource Strain: Low Risk  (02/28/2024)   Overall Financial Resource Strain (CARDIA)    Difficulty of Paying Living Expenses: Not hard at all   Food Insecurity: No Food Insecurity (06/10/2024)   Hunger Vital Sign    Worried About Running Out of Food in the Last Year: Never true    Ran Out of Food in the Last Year: Never true  Transportation Needs: No Transportation Needs (06/10/2024)   PRAPARE - Administrator, Civil Service (Medical): No    Lack of Transportation (Non-Medical): No  Physical Activity: Inactive (02/28/2024)   Exercise Vital Sign    Days of Exercise per Week: 0 days    Minutes of Exercise per Session: Not on file  Stress: No Stress Concern Present (02/28/2024)   [employer] of Occupational Health - Occupational Stress Questionnaire    Feeling of Stress: Not at all  Social Connections: Moderately Isolated (06/10/2024)   Social Connection and Isolation Panel    Frequency of Communication with Friends and Family: More than three times a week    Frequency of Social Gatherings with Friends and Family: Patient declined    Attends Religious Services: Patient declined    Database Administrator or Organizations: No    Attends Engineer, Structural: Never    Marital Status: Married   Family History  Problem Relation Age of Onset   Cancer Mother        breast   Stroke Mother    Valvular heart disease Mother    Breast cancer Mother 40   Alzheimer's disease Mother    Kidney disease Father    Stroke Father    Coronary artery disease Father    Parkinson's disease Father    Diabetes Brother    Alzheimer's disease Maternal Grandmother    Diabetes Maternal Aunt    Alzheimer's disease Maternal Aunt    Alzheimer's disease Maternal Aunt    Colon cancer Neg Hx    Pancreatic cancer Neg Hx    Stomach cancer Neg Hx    Allergies  Allergen Reactions   Amoxicillin-Pot Clavulanate    Codeine     REACTION: Gives her migraines   Diclofenac  Other (See Comments)    Patient states it made her hallucinate   Hydrocodone-Acetaminophen     Lovastatin     REACTION: myalgias   Soma [Carisoprodol] Other (See  Comments)    Confussion   Sulfonamide Derivatives     REACTION: Rash   Tizanidine     hallucination   Sulfa Antibiotics Rash    REACTION: Rash   Tape Rash   Prior to Admission medications   Medication Sig Start Date End Date Taking? Authorizing Provider  brimonidine-timolol  (COMBIGAN) 0.2-0.5 % ophthalmic solution Apply 1 drop to eye 2 (two) times daily. 03/30/24  Yes [provider]  desipramine  (NORPRAMIN ) 25 MG tablet Take 1 tablet (25 mg total) by mouth at  bedtime. 01/12/24  Yes Bedsole, Amy E, MD  ezetimibe  (ZETIA ) 10 MG tablet Take 1 tablet (10 mg total) by mouth daily. 04/10/24 04/10/25 Yes Carey, Mariah L, PA-C  latanoprost  (XALATAN ) 0.005 % ophthalmic solution Place 1 drop into both eyes at bedtime.   Yes [provider]  metFORMIN  (GLUCOPHAGE -XR) 500 MG 24 hr tablet Take 1 tablet (500 mg total) by mouth 2 (two) times daily with a meal. 03/28/24  Yes Bedsole, Amy E, MD  metoprolol  tartrate (LOPRESSOR ) 25 MG tablet Take 0.5 tablets (12.5 mg total) by mouth 2 (two) times daily. 05/25/24  Yes Bedsole, Amy E, MD  Multiple Vitamins-Minerals (PRESERVISION AREDS PO) Take by mouth.   Yes [provider]  Accu-Chek Softclix Lancets lancets TEST BLOOD SUGAR TWICE DAILY 12/27/21   Bedsole, Amy E, MD  Alcohol Swabs (DROPSAFE ALCOHOL PREP) 70 % PADS USE TWO TIMES DAILY 12/26/23   Bedsole, Amy E, MD  Blood Glucose Calibration (ACCU-CHEK AVIVA) SOLN Use to check controls on glucose meter 08/20/20   Bedsole, Amy E, MD  Blood Glucose Monitoring Suppl (ACCU-CHEK AVIVA PLUS) w/Device KIT Use to check blood sugar two times a day 08/20/20   Bedsole, Amy E, MD  glucose blood (ACCU-CHEK AVIVA PLUS) test strip TEST BLOOD SUGAR TWICE DAILY 01/16/24   Bedsole, Amy E, MD  hydrocortisone  (ANUSOL -HC) 2.5 % rectal cream Place 1 Application rectally 2 (two) times daily. Patient not taking: Reported on 06/10/2024 03/20/22   Unk Corinn Skiff, MD   DG Knee Complete 4 Views Right Result Date:  06/10/2024 CLINICAL DATA:  Right knee pain and swelling for 1 week. EXAM: RIGHT KNEE - COMPLETE 4+ VIEW COMPARISON:  None Available. FINDINGS: No evidence of fracture or dislocation. Moderate knee joint effusion is seen. Mild degenerative spurring of tibial spines, and enthesopathic change of the patella noted. IMPRESSION: Moderate knee joint effusion. No evidence of fracture or dislocation. Electronically Signed   By: Norleen DELENA Kil M.D.   On: 06/10/2024 08:41   Positive ROS: All other systems have been reviewed and were otherwise negative with the exception of those mentioned in the HPI and as above.  Physical Exam: General:  Alert, no acute distress Psychiatric:  Patient is competent for consent with normal mood and affect   Cardiovascular:  No pedal edema Respiratory:  No wheezing, non-labored breathing GI:  Abdomen is soft and non-tender Skin:  No lesions in the area of chief complaint Neurologic:  Sensation intact distally Lymphatic:  No axillary or cervical lymphadenopathy  Orthopedic Exam:  Orthopedic examination is limited to the right knee and lower extremity.  Skin inspection around the right knee is notable for mild swelling and warmth, but no erythema, ecchymosis, abrasions, or other skin abnormalities are identified.  She exhibits a 2+ effusion.  She has mild-moderate tenderness to palpation over the medial and lateral aspects of the knee.  She is able to range her knee only from approximately 25 to 35 degrees with moderate to severe pain on attempted active or passive range of motion.  She is grossly neurovascularly intact to the right lower extremity and foot.  X-rays:  Recent x-rays of the right knee are available for review and have been reviewed by myself.  The findings are as described above.  Assessment: Acute right knee effusion of unclear etiology.  Plan: The treatment options, including both surgical and nonsurgical choices, have been discussed in detail with the  patient.  The patient understands that her clinical picture is not convincing either for a  septic knee nor for another cause of effusion such as trauma or gout/pseudogout.  However, after discussing the treatment options, specifically further observation versus formal arthroscopic irrigation and debridement, the patient would like to proceed with the latter option.  The risks (including bleeding, infection, nerve and/or blood vessel injury, persistent or recurrent pain, stiffness of the knee, progression of arthritis, need for further surgery, blood clots, strokes, heart attacks or arrhythmias, pneumonia, etc.) and benefits of the surgical procedure were discussed.  The patient states her understanding and agrees to proceed.  A formal written consent will be obtained by the nursing staff.  Thank you for asking me to participate in the care of this most pleasant unfortunate woman.  I will be happy to follow her with you.   DOROTHA Reyes Maltos, MD  Beeper #:  [phone]  06/11/2024 7:59 AM

## 2024-06-11 NOTE — Assessment & Plan Note (Signed)
 Statin allergy noted. - Continue home Zetia 

## 2024-06-11 NOTE — Anesthesia Preprocedure Evaluation (Addendum)
 Anesthesia Evaluation  Patient identified by MRN, date of birth, ID band Patient awake    Reviewed: Allergy & Precautions, NPO status , Patient's Chart, lab work & pertinent test results  Airway Mallampati: II  TM Distance: >3 FB Neck ROM: full    Dental  (+) Teeth Intact   Pulmonary asthma , COPD, former smoker   Pulmonary exam normal        Cardiovascular Exercise Tolerance: Good hypertension, Pt. on medications Normal cardiovascular exam Rhythm:Regular Rate:Normal     Neuro/Psych  Headaches   Depression    TIAnegative neurological ROS  negative psych ROS   GI/Hepatic negative GI ROS, Neg liver ROS,GERD  Medicated,,  Endo/Other  diabetes, Type 2, Oral Hypoglycemic Agents  Class 3 obesity  Renal/GU      Musculoskeletal  (+) Arthritis ,  Fibromyalgia -  Abdominal Normal abdominal exam  (+)   Peds negative pediatric ROS (+)  Hematology negative hematology ROS (+)   Anesthesia Other Findings Past Medical History: No date: Allergy No date: Asthma with COPD (HCC) No date: Complication of anesthesia     Comment:  Difficulty waking after Spinal injection (2006) No date: Depression     Comment:  daughter was very sick, depression began during this               period of time  No date: Diabetes mellitus No date: Esophagitis No date: Fibromyalgia No date: GERD (gastroesophageal reflux disease) No date: Glaucoma No date: History of colon polyps No date: HTN (hypertension) No date: Hyperlipidemia No date: Lichen sclerosus No date: Migraine No date: Perforating folliculitis No date: Spinal stenosis No date: Tachycardia No date: TIA (transient ischemic attack)  Past Surgical History: No date: ABDOMINAL HYSTERECTOMY     Comment:  right ovary remaining No date: APPENDECTOMY No date: BREAST CYST ASPIRATION; Bilateral 1982: BREAST EXCISIONAL BIOPSY; Right     Comment:  NEG No date: BREAST SURGERY     Comment:   right(benign) No date: CARPAL TUNNEL RELEASE No date: CESAREAN SECTION 06/29/2021: COLONOSCOPY WITH PROPOFOL ; N/A     Comment:  Procedure: COLONOSCOPY WITH PROPOFOL ;  Surgeon: Jinny Carmine, MD;  Location: [facility];  Service:               Endoscopy;  Laterality: N/A;  Diabetic 2013: EPIDURAL BLOCK INJECTION 06/29/2021: ESOPHAGOGASTRODUODENOSCOPY (EGD) WITH PROPOFOL ; N/A     Comment:  Procedure: ESOPHAGOGASTRODUODENOSCOPY (EGD) WITH               PROPOFOL ;  Surgeon: Jinny Carmine, MD;  Location: [facility];  Service: Endoscopy;  Laterality: N/A; No date: KNEE ARTHROSCOPY W/ MENISCAL REPAIR     Comment:  left No date: LIPOMA EXCISION     Comment:  2 times left leg No date: lumpectomy No date: TONSILLECTOMY No date: TRIGGER FINGER RELEASE  BMI    Body Mass Index: 31.25 kg/m      Reproductive/Obstetrics negative OB ROS                              Anesthesia Physical Anesthesia Plan  ASA: 3  Anesthesia Plan: General   Post-op Pain Management:    Induction: Intravenous  PONV Risk Score and Plan: Dexamethasone, Ondansetron , Midazolam and Treatment may vary due to age or medical  condition  Airway Management Planned: LMA  Additional Equipment:   Intra-op Plan:   Post-operative Plan: Extubation in OR  Informed Consent: I have reviewed the patients History and Physical, chart, labs and discussed the procedure including the risks, benefits and alternatives for the proposed anesthesia with the patient or authorized representative who has indicated his/her understanding and acceptance.     Dental Advisory Given  Plan Discussed with: CRNA  Anesthesia Plan Comments:          Anesthesia Quick Evaluation

## 2024-06-12 ENCOUNTER — Encounter: Payer: Self-pay | Admitting: Surgery

## 2024-06-12 DIAGNOSIS — E1159 Type 2 diabetes mellitus with other circulatory complications: Secondary | ICD-10-CM | POA: Diagnosis not present

## 2024-06-12 DIAGNOSIS — M25561 Pain in right knee: Secondary | ICD-10-CM | POA: Diagnosis not present

## 2024-06-12 DIAGNOSIS — E1122 Type 2 diabetes mellitus with diabetic chronic kidney disease: Secondary | ICD-10-CM | POA: Diagnosis not present

## 2024-06-12 DIAGNOSIS — M009 Pyogenic arthritis, unspecified: Secondary | ICD-10-CM | POA: Diagnosis not present

## 2024-06-12 LAB — BASIC METABOLIC PANEL WITH GFR
Anion gap: 13 (ref 5–15)
BUN: 27 mg/dL — ABNORMAL HIGH (ref 8–23)
CO2: 21 mmol/L — ABNORMAL LOW (ref 22–32)
Calcium: 8.4 mg/dL — ABNORMAL LOW (ref 8.9–10.3)
Chloride: 104 mmol/L (ref 98–111)
Creatinine, Ser: 1.33 mg/dL — ABNORMAL HIGH (ref 0.44–1.00)
GFR, Estimated: 41 mL/min — ABNORMAL LOW (ref >60)
Glucose, Bld: 132 mg/dL — ABNORMAL HIGH (ref 70–99)
Potassium: 4.1 mmol/L (ref 3.5–5.1)
Sodium: 138 mmol/L (ref 135–145)

## 2024-06-12 LAB — CBC
HCT: 31.9 % — ABNORMAL LOW (ref 36.0–46.0)
Hemoglobin: 10.7 g/dL — ABNORMAL LOW (ref 12.0–15.0)
MCH: 29.9 pg (ref 26.0–34.0)
MCHC: 33.5 g/dL (ref 30.0–36.0)
MCV: 89.1 fL (ref 80.0–100.0)
Platelets: 311 K/uL (ref 150–400)
RBC: 3.58 MIL/uL — ABNORMAL LOW (ref 3.87–5.11)
RDW: 13.1 % (ref 11.5–15.5)
WBC: 14.8 K/uL — ABNORMAL HIGH (ref 4.0–10.5)
nRBC: 0 % (ref 0.0–0.2)

## 2024-06-12 LAB — GLUCOSE, CAPILLARY
Glucose-Capillary: 110 mg/dL — ABNORMAL HIGH (ref 70–99)
Glucose-Capillary: 183 mg/dL — ABNORMAL HIGH (ref 70–99)
Glucose-Capillary: 112 mg/dL — ABNORMAL HIGH (ref 70–99)
Glucose-Capillary: 166 mg/dL — ABNORMAL HIGH (ref 70–99)

## 2024-06-12 MED ORDER — KETOROLAC TROMETHAMINE 15 MG/ML IJ SOLN
7.5000 mg | Freq: Four times a day (QID) | INTRAMUSCULAR | Status: DC
  Administered 2024-06-12 – 2024-06-13 (×2): 7.5 mg via INTRAVENOUS
  Filled 2024-06-12 (×2): qty 1

## 2024-06-12 MED ORDER — TRAMADOL HCL 50 MG PO TABS: 50.0000 mg | ORAL_TABLET | Freq: Four times a day (QID) | ORAL | Status: DC | PRN

## 2024-06-12 MED ORDER — DOCUSATE SODIUM 100 MG PO CAPS
100.0000 mg | ORAL_CAPSULE | Freq: Two times a day (BID) | ORAL | Status: DC
  Administered 2024-06-12 – 2024-06-13 (×2): 100 mg via ORAL
  Filled 2024-06-12 (×2): qty 1

## 2024-06-12 MED ORDER — ENOXAPARIN SODIUM 40 MG/0.4ML IJ SOSY
40.0000 mg | PREFILLED_SYRINGE | INTRAMUSCULAR | Status: DC
  Administered 2024-06-12: 40 mg via SUBCUTANEOUS
  Filled 2024-06-12: qty 0.4

## 2024-06-12 MED ORDER — LINEZOLID 600 MG/300ML IV SOLN
600.0000 mg | Freq: Two times a day (BID) | INTRAVENOUS | Status: DC
  Administered 2024-06-12 – 2024-06-13 (×3): 600 mg via INTRAVENOUS
  Filled 2024-06-12 (×3): qty 300

## 2024-06-12 MED ORDER — MORPHINE SULFATE (PF) 2 MG/ML IV SOLN
1.0000 mg | INTRAVENOUS | Status: DC | PRN
  Administered 2024-06-12: 2 mg via INTRAVENOUS
  Filled 2024-06-12: qty 1

## 2024-06-12 MED ORDER — ASPIRIN 325 MG PO TBEC
325.0000 mg | DELAYED_RELEASE_TABLET | Freq: Every day | ORAL | Status: DC
  Administered 2024-06-12 – 2024-06-13 (×2): 325 mg via ORAL
  Filled 2024-06-12 (×2): qty 1

## 2024-06-12 NOTE — Assessment & Plan Note (Signed)
 Suspected septic joint based on synovial fluid cell count but preliminary cultures negative S/p arthroscopy and washout with wound VAC placement. -Continue ceftriaxone 2 g IV daily, vancomycin per pharmacy

## 2024-06-12 NOTE — Evaluation (Signed)
 Physical Therapy Evaluation Patient Details Name: Cathy Mullins MRN: [id number] DOB: [birth year] Today's Date: 06/12/2024  History of Present Illness  [name] is a 78yoF who [facility] 06/10/24 c/o insidious, progressive Rt knee pain and swelling beginning 6 days prior. PMH: NIDDM2, HTN, HLD. Pt Is s/p arthrocentesis per EDP, synovial cell count show WBC is 26,775 with neutrophil of 90%, does not quite meet criteria for septic joint per orthopedic surgeon. Pt elected to undergo arthroscopic irrigation and debridement with ortho 06/11/24, pt is now WBAT.  Clinical Impression  Pt able to demonstrate household distance AMB with RW, at slower than appropriate speeds, however has consistent performance from start to finish and demonstrates safe RW use and zero LOB. Pt reports pain as low to mild in session. Pt has all DME in place at home (RW husband ordered is arriving tomorrow). Pt was working on some basic motor activation and ROM prior to my arrival, all things recommended by Gustavo Level. Pt would benefit from HHPT followup at DC and can decide at ortho FU in a few weeks if additional OPPT services are needed. Will continue to follow.       If plan is discharge home, recommend the following: A little help with walking and/or transfers;A little help with bathing/dressing/bathroom;Assistance with cooking/housework;Assist for transportation;Help with stairs or ramp for entrance   Can travel by private vehicle        Equipment Recommendations None recommended by PT  Recommendations for Other Services       Functional Status Assessment Patient has had a recent decline in their functional status and demonstrates the ability to make significant improvements in function in a reasonable and predictable amount of time.     Precautions / Restrictions Precautions Precautions: Fall;Knee Precaution Booklet Issued: No Restrictions Weight Bearing Restrictions Per Provider Order: No       Mobility  Bed Mobility Overal bed mobility: Needs Assistance Bed Mobility: Supine to Sit     Supine to sit: Supervision, Used rails, HOB elevated     General bed mobility comments: slow moving but able;    Transfers Overall transfer level: Needs assistance Equipment used: None Transfers: Sit to/from Stand Sit to Stand: Contact guard assist           General transfer comment: heavy effort from EOB, more painful, 2 phase transfers; knee extension first, then trunk extension with support thereafter    Ambulation/Gait Ambulation/Gait assistance: Contact guard assist, Supervision Gait Distance (Feet): 225 Feet Assistive device: Rolling walker (2 wheels) Gait Pattern/deviations: WFL(Within Functional Limits) Gait velocity: 0.78m/s        Stairs            Wheelchair Mobility     Tilt Bed    Modified Rankin (Stroke Patients Only)       Balance Overall balance assessment: Modified Independent                                           Pertinent Vitals/Pain Pain Assessment Pain Assessment: 0-10 Pain Score: 2  Pain Location: Rt knee (4/10 when in weightbearing) Pain Intervention(s): Limited activity within patient's tolerance, Monitored during session, Premedicated before session    Home Living Family/patient expects to be discharged to:: Private residence Living Arrangements: Spouse/significant other Available Help at Discharge: Family (DTR lives in [place] is involved regularly) Type of Home: House Home Access: Stairs to  enter   Entrance Stairs-Number of Steps: 2 partial steps with grab bar   Home Layout: One level Home Equipment: Agricultural Consultant (2 wheels);Cane - single point (RW will arrive tomorrow to home.)      Prior Function Prior Level of Function : Independent/Modified Independent             Mobility Comments: mostly household with occasional limited community distance AMB- chronically limited by spine  pathology, Left hip OA, and chronic whiplash associated neck pain. ADLs Comments: modI at baseline     Extremity/Trunk Assessment                Communication        Cognition Arousal: Alert Behavior During Therapy: WFL for tasks assessed/performed   PT - Cognitive impairments: No apparent impairments                                 Cueing       General Comments      Exercises     Assessment/Plan    PT Assessment Patient needs continued PT services  PT Problem List Decreased strength;Decreased range of motion;Decreased activity tolerance;Decreased balance;Decreased mobility;Decreased knowledge of use of DME;Decreased safety awareness;Decreased knowledge of precautions       PT Treatment Interventions DME instruction;Gait training;Stair training;Functional mobility training;Therapeutic activities;Therapeutic exercise;Balance training;Neuromuscular re-education;Patient/family education    PT Goals (Current goals can be found in the Care Plan section)  Acute Rehab PT Goals Patient Stated Goal: regain optimal knee function PT Goal Formulation: With patient Time For Goal Achievement: 06/26/24 Potential to Achieve Goals: Good    Frequency Min 3X/week     Co-evaluation               AM-PAC PT 6 Clicks Mobility  Outcome Measure Help needed turning from your back to your side while in a flat bed without using bedrails?: A Little Help needed moving from lying on your back to sitting on the side of a flat bed without using bedrails?: A Little Help needed moving to and from a bed to a chair (including a wheelchair)?: A Little Help needed standing up from a chair using your arms (e.g., wheelchair or bedside chair)?: A Little Help needed to walk in hospital room?: A Little Help needed climbing 3-5 steps with a railing? : A Lot 6 Click Score: 17    End of Session Equipment Utilized During Treatment: Gait belt Activity Tolerance: Patient  tolerated treatment well;No increased pain Patient left: in bed;with call bell/phone within reach;with family/visitor present Nurse Communication: Mobility status PT Visit Diagnosis: Difficulty in walking, not elsewhere classified (R26.2);Other abnormalities of gait and mobility (R26.89)    Time: 8588-8562 PT Time Calculation (min) (ACUTE ONLY): 26 min   Charges:   PT Evaluation $PT Eval Moderate Complexity: 1 Mod PT Treatments $Therapeutic Activity: 8-22 mins PT General Charges $$ ACUTE PT VISIT: 1 Visit    2:55 PM, 06/12/24 Peggye JAYSON Linear, PT, DPT Physical Therapist - [facility]  [phone] (ASCOM)    Kaelon Weekes C 06/12/2024, 2:53 PM

## 2024-06-12 NOTE — Assessment & Plan Note (Signed)
 Blood pressure within goal now.  Patient was not on any significant antihypertensives at home except low-dose metoprolol .  Amlodipine was added - Continuing home metoprolol  -Continue with current dose of amlodipine --As needed hydralazine 

## 2024-06-12 NOTE — Assessment & Plan Note (Signed)
 High suspicion for septic joint Status post arthrocentesis per EDP Synovial cell count show WBC is 26,775 with neutrophil of 90% Does not quite meet criteria for septic joint per orthopedic surgeon - Preliminary synovial fluid cultures with no growth -S/p arthroscopy and washout with orthopedic surgery on 10/3, preliminary intraoperative cultures with no growth in less than 12-hour -Continue with wound VAC for another day per orthopedic surgery. -Continue with antibiotics -Continue with supportive care

## 2024-06-12 NOTE — Progress Notes (Signed)
 Subjective: 1 Day Post-Op Procedure(s) (LRB): ARTHROSCOPY, KNEE (Right) Patient reports pain as moderate.   Patient is overall well, reports chills this morning. Plan is to go Home after hospital stay. Negative for chest pain and shortness of breath Fever: no Gastrointestinal:Negative for nausea and vomiting  Objective: Vital signs in last 24 hours: Temp:  [97.2 F (36.2 C)-98.8 F (37.1 C)] 98.5 F (36.9 C) (11/04 0447) Pulse Rate:  [68-106] 68 (11/04 0447) Resp:  [11-18] 18 (11/04 0447) BP: (131-186)/(57-82) 131/61 (11/04 0447) SpO2:  [93 %-100 %] 100 % (11/04 0447) Weight:  [72.6 kg] 72.6 kg (11/03 1250)  Intake/Output from previous day:  Intake/Output Summary (Last 24 hours) at 06/12/2024 0730 Last data filed at 06/12/2024 0630 Gross per 24 hour  Intake 1580 ml  Output 995 ml  Net 585 ml    Intake/Output this shift: No intake/output data recorded.  Labs: Recent Labs    06/10/24 0754 06/11/24 0356 06/12/24 0611  HGB 11.4* 12.8 10.7*   Recent Labs    06/11/24 0356 06/12/24 0611  WBC 14.3* 14.8*  RBC 4.32 3.58*  HCT 38.5 31.9*  PLT 337 311   Recent Labs    06/11/24 0356 06/12/24 0611  NA 135 138  K 4.5 4.1  CL 100 104  CO2 21* 21*  BUN 21 27*  CREATININE 1.22* 1.33*  GLUCOSE 131* 132*  CALCIUM 9.2 8.4*   No results for input(s): LABPT, INR in the last 72 hours.   EXAM General - Patient is Alert, Appropriate, and Oriented Extremity - ABD soft Neurovascular intact Dorsiflexion/Plantar flexion intact Dressing/Incision - clean, dry, no drainage noted to the right knee ACE wrap.  Hemovac in place without significant drainage this morning. Motor Function - intact, moving foot and toes well on exam.  Able to perform straight leg raise this AM with assistance. Abdomen soft with intact bowel sounds. Negative Homans test.  Past Medical History:  Diagnosis Date   Allergy    Asthma with COPD (HCC)    Complication of anesthesia    Difficulty  waking after Spinal injection (2006)   Depression    daughter was very sick, depression began during this period of time    Diabetes mellitus    Esophagitis    Fibromyalgia    GERD (gastroesophageal reflux disease)    Glaucoma    History of colon polyps    HTN (hypertension)    Hyperlipidemia    Lichen sclerosus    Migraine    Perforating folliculitis    Spinal stenosis    Tachycardia    TIA (transient ischemic attack)     Assessment/Plan: 1 Day Post-Op Procedure(s) (LRB): ARTHROSCOPY, KNEE (Right) Principal Problem:   Right knee pain Active Problems:   DM (diabetes mellitus), type 2 with renal complications (HCC)   Hyperlipidemia associated with type 2 diabetes mellitus (HCC)   Hypertension associated with diabetes (HCC)   GERD   Fibromyalgia   Septic joint (HCC)   CKD stage 3a, GFR 45-59 ml/min (HCC)  Estimated body mass index is 31.25 kg/m as calculated from the following:   Height as of this encounter: 5' (1.524 m).   Weight as of this encounter: 72.6 kg. Advance diet Up with therapy D/C IV fluids when tolerating po intake.  Labs and vitals reviewed.  No recent fever, WBC 14.8, continue to monitor. Possible septic with knee with possible PVNS.  Tissue sent to pathology. Culture is pending at this time. Gram stain with few WBC present, no organisms seen.  Continue IV Rocephin and Vancomycin. Will remove Hemovac tomorrow AM. Up with therapy today.  DVT Prophylaxis - Aspirin  and Foot Pumps Weight-Bearing as tolerated to right leg  J. Gustavo Level, PA-C [facility] Orthopaedic Surgery 06/12/2024, 7:30 AM

## 2024-06-12 NOTE — Assessment & Plan Note (Signed)
 Creatinine at 1.33 today but remained within baseline.   -Monitor renal function -Avoid nephrotoxins

## 2024-06-12 NOTE — Plan of Care (Signed)

## 2024-06-12 NOTE — Progress Notes (Signed)
 Progress Note   Patient: Cathy Mullins FMW:[id number] DOB: [birth year] DOA: 06/10/2024     2 DOS: the patient was seen and examined on 06/12/2024   Brief hospital course: Ms. Cathy Mullins is a 78 year old female with history of non-insulin -dependent diabetes mellitus type 2, hypertension, fibromyalgia, hyperlipidemia, who presents ED for chief concerns of right knee pain.  Vitals in the ED showed t 98.4, rr 16, hr 76, blood pressure 104/79, SpO2 100% on room air.  Serum sodium is 134, potassium 3.7, chloride 98, bicarb 23, BUN of 22, serum creatinine 1.14, eGFR 49, nonfasting blood glucose 168, WBC 16.1, hemoglobin 11.4, platelets of 318. Lactic acid was initially 2.5 and on repeat is 1.3.  CRP is 12.9, sed rate 47, synovial joint: Cloudy, WBC 26,775, and neutrophil in the synovial was 90%.  Sed rate was 47, CRP 12.9.  ED treatment: Ceftriaxone 2, IV one-time dose, vancomycin per pharmacy, lidocaine , morphine 4 mg.  Patient received arthrocentesis via EDP.  EDP consulted orthopedic provider who states he will evaluate patient given the synovial fluid not entirely consistent with septic joint.  Continue IV antibiotic and steroids and if symptoms continue, can have washout with orthopedic service tomorrow.  11/3: Vitals with mild tachycardia and mildly elevated blood pressure, small improvement in leukocytosis now at 14.3, small worsening of creatinine but remained within her baseline.  Joint fluid cultures with no growth in 24 hours Going for joint washout with orthopedic surgery today.  11/4: Hemodynamically stable, s/p arthroscopy and washout with orthopedic surgery on 11/3, wound VAC was applied.  Preliminary cultures with no growth in less than 12-hour. Hemoglobin with some decreased to 10.7 and leukocytosis at 14.8.  Assessment and Plan: * Right knee pain High suspicion for septic joint Status post arthrocentesis per EDP Synovial cell count show WBC is 26,775 with neutrophil of 90% Does  not quite meet criteria for septic joint per orthopedic surgeon - Preliminary synovial fluid cultures with no growth -S/p arthroscopy and washout with orthopedic surgery on 10/3, preliminary intraoperative cultures with no growth in less than 12-hour -Continue with wound VAC for another day per orthopedic surgery. -Continue with antibiotics -Continue with supportive care  Septic joint (HCC) Suspected septic joint based on synovial fluid cell count but preliminary cultures negative S/p arthroscopy and washout with wound VAC placement. -Continue ceftriaxone 2 g IV daily, vancomycin per pharmacy  Hypertension associated with diabetes (HCC) Blood pressure within goal now.  Patient was not on any significant antihypertensives at home except low-dose metoprolol .  Amlodipine was added - Continuing home metoprolol  -Continue with current dose of amlodipine --As needed hydralazine   DM (diabetes mellitus), type 2 with renal complications (HCC) Non-insulin -dependent diabetes mellitus type 2 Home metformin  will not be resumed on admission Insulin  SSI with at bedtime coverage, renal dosing ordered Goal inpatient blood glucose levels 140-180  CKD stage 3a, GFR 45-59 ml/min (HCC) Creatinine at 1.33 today but remained within baseline.   -Monitor renal function -Avoid nephrotoxins  Hyperlipidemia associated with type 2 diabetes mellitus (HCC) Statin allergy noted. - Continue home Zetia    Subjective: Patient continued to have right knee pain.  Wound VAC with bloody discharge.  Physical Exam: Vitals:   06/12/24 0447 06/12/24 1054 06/12/24 1130 06/12/24 1516  BP: 131/61 102/70 (!) 123/53 (!) 124/57  Pulse: 68 (!) 56 (!) 57 66  Resp: 18  18 18   Temp: 98.5 F (36.9 C)  97.6 F (36.4 C) 97.7 F (36.5 C)  TempSrc: Oral  Oral   SpO2: 100%  98% 100% 100%  Weight:      Height:       General.  Overweight lady, in no acute distress. Pulmonary.  Lungs clear bilaterally, normal respiratory  effort. CV.  Regular rate and rhythm, no JVD, rub or murmur. Abdomen.  Soft, nontender, nondistended, BS positive. CNS.  Alert and oriented .  No focal neurologic deficit. Extremities.  No peripheral edema, right knee with bandage and wound VAC Psychiatry.  Judgment and insight appears normal.    Data Reviewed: Prior data reviewed  Family Communication: Discussed with husband at bedside  Disposition: Status is: Inpatient Remains inpatient appropriate because: Severity of illness  Planned Discharge Destination: Home  Time spent: 50 minutes  This record has been created using Conservation officer, historic buildings. Errors have been sought and corrected,but may not always be located. Such creation errors do not reflect on the standard of care.   Author: Amaryllis Dare, MD 06/12/2024 3:50 PM  For on call review [email or web address].

## 2024-06-13 DIAGNOSIS — E1159 Type 2 diabetes mellitus with other circulatory complications: Secondary | ICD-10-CM | POA: Diagnosis not present

## 2024-06-13 DIAGNOSIS — M797 Fibromyalgia: Secondary | ICD-10-CM

## 2024-06-13 DIAGNOSIS — M009 Pyogenic arthritis, unspecified: Secondary | ICD-10-CM | POA: Diagnosis not present

## 2024-06-13 DIAGNOSIS — M25561 Pain in right knee: Secondary | ICD-10-CM | POA: Diagnosis not present

## 2024-06-13 DIAGNOSIS — E1122 Type 2 diabetes mellitus with diabetic chronic kidney disease: Secondary | ICD-10-CM | POA: Diagnosis not present

## 2024-06-13 LAB — GLUCOSE, CAPILLARY
Glucose-Capillary: 136 mg/dL — ABNORMAL HIGH (ref 70–99)
Glucose-Capillary: 188 mg/dL — ABNORMAL HIGH (ref 70–99)

## 2024-06-13 LAB — BASIC METABOLIC PANEL WITH GFR
Anion gap: 9 (ref 5–15)
BUN: 28 mg/dL — ABNORMAL HIGH (ref 8–23)
CO2: 23 mmol/L (ref 22–32)
Calcium: 8 mg/dL — ABNORMAL LOW (ref 8.9–10.3)
Chloride: 103 mmol/L (ref 98–111)
Creatinine, Ser: 1.21 mg/dL — ABNORMAL HIGH (ref 0.44–1.00)
GFR, Estimated: 46 mL/min — ABNORMAL LOW (ref >60)
Glucose, Bld: 137 mg/dL — ABNORMAL HIGH (ref 70–99)
Potassium: 4.2 mmol/L (ref 3.5–5.1)
Sodium: 135 mmol/L (ref 135–145)

## 2024-06-13 LAB — BODY FLUID CULTURE W GRAM STAIN: Culture: NO GROWTH

## 2024-06-13 LAB — CBC
HCT: 30.1 % — ABNORMAL LOW (ref 36.0–46.0)
Hemoglobin: 10.3 g/dL — ABNORMAL LOW (ref 12.0–15.0)
MCH: 30.1 pg (ref 26.0–34.0)
MCHC: 34.2 g/dL (ref 30.0–36.0)
MCV: 88 fL (ref 80.0–100.0)
Platelets: 335 K/uL (ref 150–400)
RBC: 3.42 MIL/uL — ABNORMAL LOW (ref 3.87–5.11)
RDW: 13.2 % (ref 11.5–15.5)
WBC: 13.9 K/uL — ABNORMAL HIGH (ref 4.0–10.5)
nRBC: 0 % (ref 0.0–0.2)

## 2024-06-13 LAB — SURGICAL PATHOLOGY

## 2024-06-13 MED ORDER — DOXYCYCLINE HYCLATE 100 MG PO TABS: 100.0000 mg | ORAL_TABLET | Freq: Two times a day (BID) | ORAL | 0 refills | Status: AC

## 2024-06-13 MED ORDER — ASPIRIN 325 MG PO TBEC: 325.0000 mg | DELAYED_RELEASE_TABLET | Freq: Every day | ORAL | 1 refills | Status: AC

## 2024-06-13 MED ORDER — DOCUSATE SODIUM 100 MG PO CAPS: 100.0000 mg | ORAL_CAPSULE | Freq: Two times a day (BID) | ORAL | 0 refills | Status: AC

## 2024-06-13 MED ORDER — CEFADROXIL 500 MG PO CAPS: 500.0000 mg | ORAL_CAPSULE | Freq: Two times a day (BID) | ORAL | 0 refills | Status: AC

## 2024-06-13 MED ORDER — AMLODIPINE BESYLATE 5 MG PO TABS
5.0000 mg | ORAL_TABLET | Freq: Every day | ORAL | 1 refills | Status: AC
Start: 2024-06-14 — End: ?

## 2024-06-13 MED ORDER — DOXYCYCLINE HYCLATE 100 MG PO TABS: 100.0000 mg | ORAL_TABLET | Freq: Two times a day (BID) | ORAL | Status: DC

## 2024-06-13 MED ORDER — ACETAMINOPHEN 325 MG PO TABS: 650.0000 mg | ORAL_TABLET | Freq: Four times a day (QID) | ORAL | 0 refills | Status: AC | PRN

## 2024-06-13 MED ORDER — CEFADROXIL 500 MG PO CAPS: 500.0000 mg | ORAL_CAPSULE | Freq: Two times a day (BID) | ORAL | Status: DC

## 2024-06-13 MED ORDER — TRAMADOL HCL 50 MG PO TABS
50.0000 mg | ORAL_TABLET | Freq: Four times a day (QID) | ORAL | 0 refills | Status: DC | PRN
Start: 2024-06-13 — End: 2024-06-28

## 2024-06-13 NOTE — TOC Transition Note (Signed)
 Transition of Care [facility]) - Discharge Note   Patient Details  Name: Cathy Mullins MRN: [id number] Date of Birth: 04/07/1946  [facility] CM/SW Contact:  Alvaro Louder, LCSW Phone Number: 06/13/2024, 3:49 PM   Clinical Narrative:   LCSWA confirmed with MD that patient is stable for discharge. LCSWA notified the patient and they are in agreement with discharge. LCSWA reached out to [facility] admissions coordinator an started service for patient. The patient reported that she would her spouse come pick her up at discharge.  TOC signing off  Final next level of care: Home w Home Health Services Barriers to Discharge: No Barriers Identified   Patient Goals and CMS Choice            Discharge Placement              Patient chooses bed at:  (Home) Patient to be transferred to facility by: Family Name of family member notified: Self Patient and family notified of of transfer: 06/13/24  Discharge Plan and Services Additional resources added to the After Visit Summary for                            [facility] Arranged: PT, OT HH Agency: [facility] Date [facility] Agency Contacted: 06/13/24   Representative spoke with at [facility] Agency: [place]   Social Drivers of Health (SDOH) Interventions SDOH Screenings   Food Insecurity: No Food Insecurity (06/10/2024)  Housing: Low Risk  (06/10/2024)  Transportation Needs: No Transportation Needs (06/10/2024)  Utilities: Not At Risk (06/10/2024)  Alcohol Screen: Low Risk  (02/24/2023)  Depression (PHQ2-9): Low Risk  (02/29/2024)  Financial Resource Strain: Low Risk  (02/28/2024)  Physical Activity: Inactive (02/28/2024)  Social Connections: Moderately Isolated (06/10/2024)  Stress: No Stress Concern Present (02/28/2024)  Tobacco Use: Medium Risk (06/10/2024)  Health Literacy: Adequate Health Literacy (02/29/2024)     Readmission Risk Interventions     No data to display

## 2024-06-13 NOTE — Discharge Summary (Signed)
 Physician Discharge Summary   Patient: Cathy Mullins MRN: [id number] DOB: Aug 23, 1945  Admit date:     06/10/2024  Discharge date: 06/13/24  Discharge Physician: Amaryllis Dare   PCP: Avelina Greig BRAVO, MD   Recommendations at discharge:  Please obtain CBC and BMP on follow-up Follow-up with primary care provider Follow-up with orthopedic surgery  Discharge Diagnoses: Principal Problem:   Right knee pain Active Problems:   Septic joint (HCC)   Hypertension associated with diabetes (HCC)   DM (diabetes mellitus), type 2 with renal complications (HCC)   CKD stage 3a, GFR 45-59 ml/min (HCC)   Hyperlipidemia associated with type 2 diabetes mellitus (HCC)   GERD   Fibromyalgia   Hospital Course: Ms. Cathy Mullins is a 78 year old female with history of non-insulin -dependent diabetes mellitus type 2, hypertension, fibromyalgia, hyperlipidemia, who presents ED for chief concerns of right knee pain.  Vitals in the ED showed t 98.4, rr 16, hr 76, blood pressure 104/79, SpO2 100% on room air.  Serum sodium is 134, potassium 3.7, chloride 98, bicarb 23, BUN of 22, serum creatinine 1.14, eGFR 49, nonfasting blood glucose 168, WBC 16.1, hemoglobin 11.4, platelets of 318. Lactic acid was initially 2.5 and on repeat is 1.3.  CRP is 12.9, sed rate 47, synovial joint: Cloudy, WBC 26,775, and neutrophil in the synovial was 90%.  Sed rate was 47, CRP 12.9.  ED treatment: Ceftriaxone 2, IV one-time dose, vancomycin per pharmacy, lidocaine , morphine 4 mg.  Patient received arthrocentesis via EDP.  EDP consulted orthopedic provider who states he will evaluate patient given the synovial fluid not entirely consistent with septic joint.  Continue IV antibiotic and steroids and if symptoms continue, can have washout with orthopedic service tomorrow.  11/3: Vitals with mild tachycardia and mildly elevated blood pressure, small improvement in leukocytosis now at 14.3, small worsening of creatinine but remained  within her baseline.  Joint fluid cultures with no growth in 24 hours Going for joint washout with orthopedic surgery today.  11/4: Hemodynamically stable, s/p arthroscopy and washout with orthopedic surgery on 11/3, wound VAC was applied.  Preliminary cultures with no growth in less than 12-hour. Hemoglobin with some decreased to 10.7 and leukocytosis at 14.8.  11/5: Remained hemodynamically stable, wound VAC was removed.  Slowly improving leukocytosis with stable hemoglobin now.  Patient received ceftriaxone and linezolid while in the hospital and is being discharged on cefadroxil and doxycycline  for 4 more weeks after discussing with orthopedic surgery.  Although cultures remain negative with pending pathology results, there is enough evidence that 70% of septic joint cultures comes back negative.  Orthopedic surgery will have a close follow-up and make changes to her medications as appropriate.  She was also started on low-dose amlodipine due to elevated blood pressure.  Her PCP can take over and make adjustment as needed.  Patient will continue on current medications and need to have a close follow-up with her providers for further management.  Assessment and Plan: * Right knee pain High suspicion for septic joint Status post arthrocentesis per EDP Synovial cell count show WBC is 26,775 with neutrophil of 90% Does not quite meet criteria for septic joint per orthopedic surgeon - Preliminary synovial fluid cultures with no growth -S/p arthroscopy and washout with orthopedic surgery on 10/3, preliminary intraoperative cultures with no growth, pathology results are pending.  Wound VAC was removed. Discussed with orthopedic surgery and patient is being discharged on 4 weeks of cefadroxil and doxycycline  and they will follow-up as outpatient for  further assistance. -Continue with supportive care  Septic joint (HCC) Suspected septic joint based on synovial fluid cell count but preliminary  cultures negative S/p arthroscopy and washout with wound VAC placement. - IV antibiotics are switched with cefadroxil and doxycycline  for 4 more weeks  Hypertension associated with diabetes (HCC) Blood pressure within goal now.  Patient was not on any significant antihypertensives at home except low-dose metoprolol .  Amlodipine was added - Continuing home metoprolol  -Continue with current dose of amlodipine  DM (diabetes mellitus), type 2 with renal complications (HCC) Non-insulin -dependent diabetes mellitus type 2 Home metformin  will not be resumed on admission Insulin  SSI with at bedtime coverage, renal dosing ordered Goal inpatient blood glucose levels 140-180  CKD stage 3a, GFR 45-59 ml/min (HCC) Creatinine at 1.21 and within baseline -Monitor renal function -Avoid nephrotoxins  Hyperlipidemia associated with type 2 diabetes mellitus (HCC) Statin allergy noted. - Continue home Zetia   Pain control - [place]  Controlled Substance Reporting System database was reviewed. and patient was instructed, not to drive, operate heavy machinery, perform activities at heights, swimming or participation in water  activities or provide baby-sitting services while on Pain, Sleep and Anxiety Medications; until their outpatient Physician has advised to do so again. Also recommended to not to take more than prescribed Pain, Sleep and Anxiety Medications.  Consultants: Orthopedic surgery Procedures performed: Right knee arthroscopy and washout Disposition: Home health Diet recommendation:  Discharge Diet Orders (From admission, onward)     Start     Ordered   06/13/24 0000  Diet - low sodium heart healthy        06/13/24 1037           Carb modified diet DISCHARGE MEDICATION: Allergies as of 06/13/2024       Reactions   Amoxicillin-pot Clavulanate    Codeine    REACTION: Gives her migraines   Diclofenac  Other (See Comments)   Patient states it made her hallucinate    Hydrocodone-acetaminophen     Lovastatin    REACTION: myalgias   Soma [carisoprodol] Other (See Comments)   Confussion   Sulfonamide Derivatives    REACTION: Rash   Tizanidine    hallucination   Sulfa Antibiotics Rash   REACTION: Rash   Tape Rash        Medication List     TAKE these medications    Accu-Chek Aviva Plus test strip Generic drug: glucose blood TEST BLOOD SUGAR TWICE DAILY   Accu-Chek Aviva Plus w/Device Kit Use to check blood sugar two times a day   Accu-Chek Aviva Soln Use to check controls on glucose meter   Accu-Chek Softclix Lancets lancets TEST BLOOD SUGAR TWICE DAILY   acetaminophen  325 MG tablet Commonly known as: TYLENOL  Take 2 tablets (650 mg total) by mouth every 6 (six) hours as needed for mild pain (pain score 1-3), fever or headache (or Fever >/= 101).   amLODipine 5 MG tablet Commonly known as: NORVASC Take 1 tablet (5 mg total) by mouth daily. Start taking on: June 14, 2024   aspirin  EC 325 MG tablet Take 1 tablet (325 mg total) by mouth daily. Start taking on: June 14, 2024   brimonidine-timolol  0.2-0.5 % ophthalmic solution Commonly known as: COMBIGAN Apply 1 drop to eye 2 (two) times daily.   cefadroxil 500 MG capsule Commonly known as: DURICEF Take 1 capsule (500 mg total) by mouth 2 (two) times daily for 28 days. Start taking on: June 14, 2024   desipramine  25 MG tablet Commonly known as:  NORPRAMIN  Take 1 tablet (25 mg total) by mouth at bedtime.   docusate sodium 100 MG capsule Commonly known as: COLACE Take 1 capsule (100 mg total) by mouth 2 (two) times daily.   doxycycline  100 MG tablet Commonly known as: VIBRA -TABS Take 1 tablet (100 mg total) by mouth every 12 (twelve) hours for 28 days.   DropSafe Alcohol Prep 70 % Pads USE TWO TIMES DAILY   ezetimibe  10 MG tablet Commonly known as: ZETIA  Take 1 tablet (10 mg total) by mouth daily.   hydrocortisone  2.5 % rectal cream Commonly known as:  ANUSOL -HC Place 1 Application rectally 2 (two) times daily.   latanoprost  0.005 % ophthalmic solution Commonly known as: XALATAN  Place 1 drop into both eyes at bedtime.   metFORMIN  500 MG 24 hr tablet Commonly known as: GLUCOPHAGE -XR Take 1 tablet (500 mg total) by mouth 2 (two) times daily with a meal.   metoprolol  tartrate 25 MG tablet Commonly known as: LOPRESSOR  Take 0.5 tablets (12.5 mg total) by mouth 2 (two) times daily.   PRESERVISION AREDS PO Take by mouth.   traMADol  50 MG tablet Commonly known as: ULTRAM  Take 1-2 tablets (50-100 mg total) by mouth every 6 (six) hours as needed for moderate pain (pain score 4-6) or severe pain (pain score 7-10).               Discharge Care Instructions  (From admission, onward)           Start     Ordered   06/13/24 0000  Leave dressing on - Keep it clean, dry, and intact until clinic visit        06/13/24 1037            Contact information for follow-up providers     Avelina, Amy E, MD. Schedule an appointment as soon as possible for a visit in 1 week(s).   Specialty: Family Medicine Contact information: [address] [place] [address] [phone]         Poggi, Norleen PARAS, MD. Schedule an appointment as soon as possible for a visit.   Specialty: Orthopedic Surgery Contact information: [address] [facility] [place] [address] [phone]              Contact information for after-discharge care     Home Medical Care     [facility]) .   Service: Home Health Services Contact information: [address] [place]  [address] [phone]                    Discharge Exam: Fredricka Weights   06/10/24 0750 06/11/24 1250  Weight: 75.8 kg 72.6 kg   General.  Overweight lady, in no acute distress. Pulmonary.  Lungs clear bilaterally, normal respiratory effort. CV.  Regular rate and  rhythm, no JVD, rub or murmur. Abdomen.  Soft, nontender, nondistended, BS positive. CNS.  Alert and oriented .  No focal neurologic deficit. Extremities.  No edema,  pulses intact and symmetrical.  Right knee with Ace wrap Psychiatry.  Judgment and insight appears normal.   Condition at discharge: stable  The results of significant diagnostics from this hospitalization (including imaging, microbiology, ancillary and laboratory) are listed below for reference.   Imaging Studies: DG Knee Complete 4 Views Right Result Date: 06/10/2024 CLINICAL DATA:  Right knee pain and swelling for 1 week. EXAM: RIGHT KNEE - COMPLETE 4+ VIEW COMPARISON:  None  Available. FINDINGS: No evidence of fracture or dislocation. Moderate knee joint effusion is seen. Mild degenerative spurring of tibial spines, and enthesopathic change of the patella noted. IMPRESSION: Moderate knee joint effusion. No evidence of fracture or dislocation. Electronically Signed   By: Norleen DELENA Kil M.D.   On: 06/10/2024 08:41    Microbiology: Results for orders placed or performed during the hospital encounter of 06/10/24  Body fluid culture w Gram Stain     Status: None (Preliminary result)   Collection Time: 06/10/24  8:31 AM   Specimen: KNEE; Body Fluid  Result Value Ref Range Status   Specimen Description   Final    KNEE Performed at [facility], [address] [place], [place] [address]    Special Requests   Final    NONE Performed at [facility], [address] [place], [place] [address]    Gram Stain   Final    MODERATE WBC PRESENT, PREDOMINANTLY PMN NO ORGANISMS SEEN    Culture   Final    NO GROWTH 2 DAYS Performed at [facility], [address], [place], [place] [address]    Report Status PENDING  Incomplete  Blood culture (routine x 2)     Status: None (Preliminary result)   Collection Time: 06/10/24  1:49 PM   Specimen: BLOOD  Result Value Ref Range Status   Specimen Description  BLOOD LEFT ANTECUBITAL  Final   Special Requests   Final    BOTTLES DRAWN AEROBIC AND ANAEROBIC Blood Culture adequate volume   Culture   Final    NO GROWTH 3 DAYS Performed at [facility], [address], [place], [place] [address]    Report Status PENDING  Incomplete  Blood culture (routine x 2)     Status: None (Preliminary result)   Collection Time: 06/10/24  1:57 PM   Specimen: BLOOD  Result Value Ref Range Status   Specimen Description BLOOD BLOOD LEFT WRIST  Final   Special Requests   Final    BOTTLES DRAWN AEROBIC AND ANAEROBIC Blood Culture adequate volume   Culture   Final    NO GROWTH 3 DAYS Performed at [facility], [address] [place], [place] [address]    Report Status PENDING  Incomplete  Aerobic/Anaerobic Culture w Gram Stain (surgical/deep wound)     Status: None (Preliminary result)   Collection Time: 06/11/24  2:14 PM   Specimen: Synovial, Right Knee; Body Fluid  Result Value Ref Range Status   Specimen Description   Final    SYNOVIAL Performed at [facility], [address] [place], [place] [address]    Special Requests   Final    RIGHT KNEE Performed at [facility], [address] [place], [place] [address]    Gram Stain   Final    FEW WBC PRESENT,BOTH PMN AND MONONUCLEAR NO ORGANISMS SEEN    Culture   Final    NO GROWTH 2 DAYS Performed at [facility], [address] [place], [place] [address]    Report Status PENDING  Incomplete    Labs: CBC: Recent Labs  Lab 06/10/24 0754 06/11/24 0356 06/12/24 0611 06/13/24 0407  WBC 16.1* 14.3* 14.8* 13.9*  NEUTROABS 11.7*  --   --   --   HGB 11.4* 12.8 10.7* 10.3*  HCT 33.3* 38.5 31.9* 30.1*  MCV 87.4 89.1 89.1 88.0  PLT 318 337 311 335   Basic Metabolic Panel: Recent Labs  Lab 06/10/24 0754 06/11/24 0356 06/12/24 0611 06/13/24 0407  NA 134* 135 138 135  K 3.7 4.5 4.1 4.2  CL 98 100 104 103  CO2 23 21* 21* 23  GLUCOSE 168* 131* 132*  137*  BUN 22 21 27* 28*  CREATININE 1.14* 1.22* 1.33* 1.21*  CALCIUM 9.0 9.2 8.4* 8.0*   Liver Function Tests: Recent Labs  Lab 06/10/24 0754  AST 25  ALT 10  ALKPHOS 52  BILITOT 0.7  PROT 7.1  ALBUMIN 3.3*   CBG: Recent Labs  Lab 06/12/24 0829 06/12/24 1132 06/12/24 1655 06/12/24 2138 06/13/24 0758  GLUCAP 110* 183* 112* 166* 136*    Discharge time spent: greater than 30 minutes.  This record has been created using Conservation officer, historic buildings. Errors have been sought and corrected,but may not always be located. Such creation errors do not reflect on the standard of care.   Signed: Amaryllis Dare, MD [facility] 06/13/2024

## 2024-06-13 NOTE — Anesthesia Postprocedure Evaluation (Signed)
 Anesthesia Post Note  Patient: Cathy Mullins  Procedure(s) Performed: ARTHROSCOPY, KNEE (Right: Knee)  Patient location during evaluation: PACU Anesthesia Type: General Level of consciousness: awake Pain management: satisfactory to patient Vital Signs Assessment: post-procedure vital signs reviewed and stable Respiratory status: spontaneous breathing Cardiovascular status: stable Anesthetic complications: no   No notable events documented.   Last Vitals:  Vitals:   06/12/24 1934 06/13/24 0435  BP: 138/67 (!) 148/66  Pulse: 79 68  Resp: 16 16  Temp: 36.7 C 37 C  SpO2: 100% 97%    Last Pain:  Vitals:   06/12/24 2140  TempSrc:   PainSc: 5                  VAN STAVEREN,Lesleigh Hughson

## 2024-06-13 NOTE — Progress Notes (Signed)
 Subjective: 2 Days Post-Op Procedure(s) (LRB): ARTHROSCOPY, KNEE (Right) Patient reports pain as mild in the right knee, feels that it is much improved compared to prior to surgery and better since yesterday.  Patient is well but does report some SOB this AM, currently on oxygen, no CP Plan is to go Home after hospital stay. Fever: no Gastrointestinal:Negative for nausea and vomiting  Objective: Vital signs in last 24 hours: Temp:  [97.6 F (36.4 C)-98.6 F (37 C)] 98.6 F (37 C) (11/05 0435) Pulse Rate:  [56-79] 68 (11/05 0435) Resp:  [16-18] 16 (11/05 0435) BP: (102-148)/(53-70) 148/66 (11/05 0435) SpO2:  [97 %-100 %] 97 % (11/05 0435)  Intake/Output from previous day:  Intake/Output Summary (Last 24 hours) at 06/13/2024 0747 Last data filed at 06/13/2024 0646 Gross per 24 hour  Intake 2140 ml  Output 470 ml  Net 1670 ml    Intake/Output this shift: No intake/output data recorded.  Labs: Recent Labs    06/10/24 0754 06/11/24 0356 06/12/24 0611 06/13/24 0407  HGB 11.4* 12.8 10.7* 10.3*   Recent Labs    06/12/24 0611 06/13/24 0407  WBC 14.8* 13.9*  RBC 3.58* 3.42*  HCT 31.9* 30.1*  PLT 311 335   Recent Labs    06/12/24 0611 06/13/24 0407  NA 138 135  K 4.1 4.2  CL 104 103  CO2 21* 23  BUN 27* 28*  CREATININE 1.33* 1.21*  GLUCOSE 132* 137*  CALCIUM 8.4* 8.0*   No results for input(s): LABPT, INR in the last 72 hours.   EXAM General - Patient is Alert, Appropriate, and Oriented Extremity - ABD soft Neurovascular intact Dorsiflexion/Plantar flexion intact Dressing/Incision - clean, dry, no drainage noted to the right knee ACE wrap.  Hemovac in place without significant drainage this morning.  Hemovac removed without issue. No erythema noted to the right knee, at most mild effusion. Able to bend and straighten knee with minimal pain.  New ACE applied. Motor Function - intact, moving foot and toes well on exam.  Able to perform straight leg  raise this AM Abdomen soft with intact bowel sounds. Negative Homans test.  Past Medical History:  Diagnosis Date   Allergy    Asthma with COPD (HCC)    Complication of anesthesia    Difficulty waking after Spinal injection (2006)   Depression    daughter was very sick, depression began during this period of time    Diabetes mellitus    Esophagitis    Fibromyalgia    GERD (gastroesophageal reflux disease)    Glaucoma    History of colon polyps    HTN (hypertension)    Hyperlipidemia    Lichen sclerosus    Migraine    Perforating folliculitis    Spinal stenosis    Tachycardia    TIA (transient ischemic attack)     Assessment/Plan: 2 Days Post-Op Procedure(s) (LRB): ARTHROSCOPY, KNEE (Right) Principal Problem:   Right knee pain Active Problems:   DM (diabetes mellitus), type 2 with renal complications (HCC)   Hyperlipidemia associated with type 2 diabetes mellitus (HCC)   Hypertension associated with diabetes (HCC)   GERD   Fibromyalgia   Septic joint (HCC)   CKD stage 3a, GFR 45-59 ml/min (HCC)  Estimated body mass index is 31.25 kg/m as calculated from the following:   Height as of this encounter: 5' (1.524 m).   Weight as of this encounter: 72.6 kg. Advance diet Up with therapy D/C IV fluids when tolerating po intake.  Labs  and vitals reviewed.  No recent fever, WBC trending down to 13.9. Possible septic with knee with possible PVNS.  Tissue sent to pathology. Culture is pending at this time. Gram stain with few WBC present, no organisms seen. Culture is negative currently. Continue IV Rocephin and Vancomycin. Hemovac removed this morning. Up with therapy today.  DVT Prophylaxis - Aspirin  and Foot Pumps Weight-Bearing as tolerated to right leg  J. Gustavo Level, PA-C [facility] 06/13/2024, 7:47 AM

## 2024-06-13 NOTE — Plan of Care (Signed)
  Problem: Health Behavior/Discharge Planning: Goal: Ability to manage health-related needs will improve Outcome: Progressing   Problem: Nutritional: Goal: Maintenance of adequate nutrition will improve Outcome: Progressing   Problem: Education: Goal: Knowledge of General Education information will improve Description: Including pain rating scale, medication(s)/side effects and non-pharmacologic comfort measures Outcome: Progressing   Problem: Coping: Goal: Level of anxiety will decrease Outcome: Progressing   Problem: Skin Integrity: Goal: Risk for impaired skin integrity will decrease Outcome: Progressing

## 2024-06-14 ENCOUNTER — Telehealth: Payer: Self-pay

## 2024-06-14 DIAGNOSIS — M009 Pyogenic arthritis, unspecified: Secondary | ICD-10-CM | POA: Diagnosis not present

## 2024-06-14 DIAGNOSIS — E1122 Type 2 diabetes mellitus with diabetic chronic kidney disease: Secondary | ICD-10-CM | POA: Diagnosis not present

## 2024-06-14 DIAGNOSIS — M797 Fibromyalgia: Secondary | ICD-10-CM | POA: Diagnosis not present

## 2024-06-14 DIAGNOSIS — E1169 Type 2 diabetes mellitus with other specified complication: Secondary | ICD-10-CM | POA: Diagnosis not present

## 2024-06-14 DIAGNOSIS — E785 Hyperlipidemia, unspecified: Secondary | ICD-10-CM | POA: Diagnosis not present

## 2024-06-14 DIAGNOSIS — K219 Gastro-esophageal reflux disease without esophagitis: Secondary | ICD-10-CM | POA: Diagnosis not present

## 2024-06-14 DIAGNOSIS — E1159 Type 2 diabetes mellitus with other circulatory complications: Secondary | ICD-10-CM | POA: Diagnosis not present

## 2024-06-14 DIAGNOSIS — I152 Hypertension secondary to endocrine disorders: Secondary | ICD-10-CM | POA: Diagnosis not present

## 2024-06-14 DIAGNOSIS — N1831 Chronic kidney disease, stage 3a: Secondary | ICD-10-CM | POA: Diagnosis not present

## 2024-06-14 NOTE — Transitions of Care (Post Inpatient/ED Visit) (Signed)
 06/14/2024  Name: Cathy Mullins MRN: [id number] DOB: 09-25-1945  Today's TOC FU Call Status: Today's TOC FU Call Status:: Successful TOC FU Call Completed TOC FU Call Complete Date: 06/14/24 Patient's Name and Date of Birth confirmed.  Transition Care Management Follow-up Telephone Call Date of Discharge: 06/13/24 Discharge Facility: [facility]) Type of Discharge: Inpatient Admission Primary Inpatient Discharge Diagnosis:: right knee pain How have you been since you were released from the hospital?: Same Any questions or concerns?: No  Items Reviewed: Did you receive and understand the discharge instructions provided?: Yes Medications obtained,verified, and reconciled?: Yes (Medications Reviewed) Any new allergies since your discharge?: No Dietary orders reviewed?: Yes Type of Diet Ordered:: low salt Heart healthy Do you have support at home?: Yes People in Home [RPT]: spouse Name of Support/Comfort Primary Source: Kermit Radi  Medications Reviewed Today: Medications Reviewed Today     Reviewed by Johnna Bollier E, RN (Registered Nurse) on 06/14/24 at [phone]  Med List Status: <None>   Medication Order Taking? Sig Documenting Provider Last Dose Status Informant  Accu-Chek Softclix Lancets lancets [id number] Yes TEST BLOOD SUGAR TWICE DAILY Bedsole, Amy E, MD  Active Self  acetaminophen  (TYLENOL ) 325 MG tablet [id number] Yes Take 2 tablets (650 mg total) by mouth every 6 (six) hours as needed for mild pain (pain score 1-3), fever or headache (or Fever >/= 101). Caleen Qualia, MD  Active   Alcohol Swabs (DROPSAFE ALCOHOL PREP) 70 % PADS [id number] Yes USE TWO TIMES DAILY Bedsole, Amy E, MD  Active Self  amLODipine (NORVASC) 5 MG tablet [id number] Yes Take 1 tablet (5 mg total) by mouth daily. Caleen Qualia, MD  Active   aspirin  EC 325 MG tablet [id number] Yes Take 1 tablet (325 mg total) by mouth daily. Caleen Qualia, MD  Active   Blood Glucose Calibration  (ACCU-CHEK AVIVA) SOLN [id number] Yes Use to check controls on glucose meter Bedsole, Amy E, MD  Active Self  Blood Glucose Monitoring Suppl (ACCU-CHEK AVIVA PLUS) w/Device KIT [id number] Yes Use to check blood sugar two times a day Bedsole, Amy E, MD  Active Self  brimonidine-timolol  (COMBIGAN) 0.2-0.5 % ophthalmic solution [id number] Yes Apply 1 drop to eye 2 (two) times daily. [provider]  Active Self  cefadroxil (DURICEF) 500 MG capsule [id number] Yes Take 1 capsule (500 mg total) by mouth 2 (two) times daily for 28 days. Amin, Sumayya, MD  Active   desipramine  (NORPRAMIN ) 25 MG tablet [id number] Yes Take 1 tablet (25 mg total) by mouth at bedtime. Avelina Greig BRAVO, MD  Active Self           Med Note DORI, Kiaira Pointer E   Thu Jun 14, 2024  9:28 AM) Patient states takes as needed.  docusate sodium (COLACE) 100 MG capsule [id number] Yes Take 1 capsule (100 mg total) by mouth 2 (two) times daily. Amin, Sumayya, MD  Active   doxycycline  (VIBRA -TABS) 100 MG tablet [id number] Yes Take 1 tablet (100 mg total) by mouth every 12 (twelve) hours for 28 days. Caleen Qualia, MD  Active   ezetimibe  (ZETIA ) 10 MG tablet [id number] Yes Take 1 tablet (10 mg total) by mouth daily. Lorene Lesley CROME, PA-C  Active Self  glucose blood (ACCU-CHEK AVIVA PLUS) test strip [id number] Yes TEST BLOOD SUGAR TWICE DAILY Bedsole, Amy E, MD  Active Self  hydrocortisone  (ANUSOL -HC) 2.5 % rectal cream [id number] Yes Place 1 Application rectally 2 (two) times daily.  Patient taking differently: Place 1 Application rectally 2 (  two) times daily. Patient states takes as needed.   Vanga, Rohini Reddy, MD  Active Self  latanoprost  (XALATAN ) 0.005 % ophthalmic solution [id number] Yes Place 1 drop into both eyes at bedtime. [provider]  Active Self  metFORMIN  (GLUCOPHAGE -XR) 500 MG 24 hr tablet [id number] Yes Take 1 tablet (500 mg total) by mouth 2 (two) times daily with a meal. Bedsole, Amy E, MD  Active Self  metoprolol  tartrate  (LOPRESSOR ) 25 MG tablet [id number] Yes Take 0.5 tablets (12.5 mg total) by mouth 2 (two) times daily. Avelina Greig BRAVO, MD  Active Self  Multiple Vitamins-Minerals (PRESERVISION AREDS PO) [id number] Yes Take by mouth. [provider]  Active Self  traMADol  (ULTRAM ) 50 MG tablet [id number] Yes Take 1-2 tablets (50-100 mg total) by mouth every 6 (six) hours as needed for moderate pain (pain score 4-6) or severe pain (pain score 7-10). Caleen Qualia, MD  Active             Home Care and Equipment/Supplies: Were Home Health Services Ordered?: Yes Name of Home Health Agency:: [facility] Has Agency set up a time to come to your home?: Yes First Home Health Visit Date: 06/14/24 Any new equipment or medical supplies ordered?: Yes (patient states her husband purchased a walker for her prior to her surgery) Name of Medical supply agency?: unsure of company Were you able to get the equipment/medical supplies?: Yes Do you have any questions related to the use of the equipment/supplies?: No  Functional Questionnaire: Do you need assistance with bathing/showering or dressing?: Yes (wife states her husband assist) Do you need assistance with meal preparation?: No Do you need assistance with eating?: Yes Do you have difficulty maintaining continence: No Do you need assistance with getting out of bed/getting out of a chair/moving?: No Do you have difficulty managing or taking your medications?: Yes (patient states her husband with medications)  Follow up appointments reviewed: PCP Follow-up appointment confirmed?: Yes Date of PCP follow-up appointment?: 06/19/24 Follow-up Provider: Dr. Greig Avelina Specialist [facility] Follow-up appointment confirmed?: Yes Date of Specialist follow-up appointment?: 06/22/24 Follow-Up Specialty Provider:: Lynwood Level, PA Do you need transportation to your follow-up appointment?: No Do you understand care options if your condition(s) worsen?:  Yes-patient verbalized understanding  SDOH Interventions Today    Flowsheet Row Most Recent Value  SDOH Interventions   Food Insecurity Interventions Intervention Not Indicated  Housing Interventions Intervention Not Indicated  Transportation Interventions Intervention Not Indicated  Utilities Interventions Intervention Not Indicated   Discussed and offered 30 day TOC program.  Patient declined.  The patient has been provided with contact information for the care management team and has been advised to call with any health -related questions or concerns.  The patient verbalized understanding with current plan of care.  The patient is directed to their insurance card regarding availability of benefits coverage.    Arvin Seip RN, BSN, CCM [employer], Population Health Case Manager Phone: [phone]

## 2024-06-14 NOTE — Patient Instructions (Addendum)
 Visit Information  Thank you for taking time to visit with me today. Please don't hesitate to contact me if I can be of assistance to you.   Patient instructions:   Notify orthopedic surgeon for signs of infection.   take antibiotics until completed and/ or as directed by provider.  notify provider for any new/ ongoing symptoms.  use walker for ambulation.  Keep yourself well hydrated and avoid constipation if taking pain medications Monitor your blood pressure daily. Notify provider for blood pressures outside of established parameters. Take recorded blood pressure readings to your hospital follow up visit with her primary care provider.    Patient verbalizes understanding of instructions and care plan provided today and agrees to view in MyChart. Active MyChart status and patient understanding of how to access instructions and care plan via MyChart confirmed with patient.     The patient has been provided with contact information for the care management team and has been advised to call with any health related questions or concerns.   Please call the care guide team at [phone] if you need to cancel or reschedule your appointment.   Please call the Suicide and Crisis Lifeline: 988 call the [place]  National Suicide Prevention Lifeline: 406-062-7102 or TTY: 641-747-4563 TTY (516)449-8905) to talk to a trained counselor call 1-800-273-TALK (toll free, 24 hour hotline) if you are experiencing a Mental Health or Behavioral Health Crisis or need someone to talk to.  Arvin Seip RN, BSN, CCM [employer], Population Health Case Manager Phone: [phone]

## 2024-06-14 NOTE — Transitions of Care (Post Inpatient/ED Visit) (Signed)
   06/14/2024  Name: Cathy Mullins MRN: [id number] DOB: [birth year]  Today's TOC FU Call Status: Today's TOC FU Call Status:: Unsuccessful Call (1st Attempt) Unsuccessful Call (1st Attempt) Date: 06/14/24  Attempted to reach the patient regarding the most recent Inpatient/ED visit.  Follow Up Plan: Additional outreach attempts will be made to reach the patient to complete the Transitions of Care (Post Inpatient/ED visit) call.   Arvin Seip RN, BSN, CCM [employer], Population Health Case Manager Phone: [phone]

## 2024-06-15 ENCOUNTER — Inpatient Hospital Stay: Admitting: Family Medicine

## 2024-06-15 LAB — CULTURE, BLOOD (ROUTINE X 2)
Culture: NO GROWTH
Culture: NO GROWTH
Special Requests: ADEQUATE
Special Requests: ADEQUATE

## 2024-06-16 LAB — AEROBIC/ANAEROBIC CULTURE W GRAM STAIN (SURGICAL/DEEP WOUND): Culture: NO GROWTH

## 2024-06-17 DIAGNOSIS — H353290 Exudative age-related macular degeneration, unspecified eye, stage unspecified: Secondary | ICD-10-CM | POA: Insufficient documentation

## 2024-06-17 DIAGNOSIS — I959 Hypotension, unspecified: Secondary | ICD-10-CM | POA: Insufficient documentation

## 2024-06-17 NOTE — Assessment & Plan Note (Signed)
 New diagnosis, followed by ophthalmology.

## 2024-06-17 NOTE — Assessment & Plan Note (Signed)
 Acute, likely in part secondary to medication and hypovolemia. Encouraged water  intake.  Remain off lisinopril .  Decrease metoprolol  further to half a tablet twice daily. Patient feels low blood pressure may also be due to additional eyedrops for wet macular degeneration.  She is unable to decrease these.  Return and ER precautions provided

## 2024-06-19 ENCOUNTER — Ambulatory Visit (INDEPENDENT_AMBULATORY_CARE_PROVIDER_SITE_OTHER): Admitting: Family Medicine

## 2024-06-19 ENCOUNTER — Encounter: Payer: Self-pay | Admitting: Family Medicine

## 2024-06-19 VITALS — BP 146/58 | HR 63 | Temp 99.0°F | Ht 60.75 in | Wt 165.2 lb

## 2024-06-19 DIAGNOSIS — E1169 Type 2 diabetes mellitus with other specified complication: Secondary | ICD-10-CM | POA: Diagnosis not present

## 2024-06-19 DIAGNOSIS — E785 Hyperlipidemia, unspecified: Secondary | ICD-10-CM | POA: Diagnosis not present

## 2024-06-19 DIAGNOSIS — Z9889 Other specified postprocedural states: Secondary | ICD-10-CM

## 2024-06-19 DIAGNOSIS — E1159 Type 2 diabetes mellitus with other circulatory complications: Secondary | ICD-10-CM

## 2024-06-19 DIAGNOSIS — N1831 Chronic kidney disease, stage 3a: Secondary | ICD-10-CM

## 2024-06-19 DIAGNOSIS — E1122 Type 2 diabetes mellitus with diabetic chronic kidney disease: Secondary | ICD-10-CM

## 2024-06-19 DIAGNOSIS — I152 Hypertension secondary to endocrine disorders: Secondary | ICD-10-CM | POA: Diagnosis not present

## 2024-06-19 DIAGNOSIS — Z7984 Long term (current) use of oral hypoglycemic drugs: Secondary | ICD-10-CM

## 2024-06-19 DIAGNOSIS — M797 Fibromyalgia: Secondary | ICD-10-CM | POA: Diagnosis not present

## 2024-06-19 DIAGNOSIS — K219 Gastro-esophageal reflux disease without esophagitis: Secondary | ICD-10-CM | POA: Diagnosis not present

## 2024-06-19 DIAGNOSIS — M009 Pyogenic arthritis, unspecified: Secondary | ICD-10-CM | POA: Diagnosis not present

## 2024-06-19 NOTE — Assessment & Plan Note (Signed)
 Postop pain tolerable.  Receiving physical therapy at home, using walker.  Back to normal p.o. intake.

## 2024-06-19 NOTE — Progress Notes (Signed)
 Patient ID: Cathy Mullins, female    DOB: 1945/11/30, 78 y.o.   MRN: [id number]  This visit was conducted in person.  BP (!) 146/58   Pulse 63   Temp 99 F (37.2 C) (Oral)   Ht 5' 0.75 (1.543 m)   Wt 165 lb 4 oz (75 kg)   SpO2 96%   BMI 31.48 kg/m    CC:  Chief Complaint  Patient presents with   Hospitalization Follow-up    Subjective:   HPI: Cathy Mullins is a 78 y.o. female presenting on 06/19/2024 for Hospitalization Follow-up  Hospital admission November 2, discharged November 5 Admitted for right knee arthroscopy  for possible septic joint Dr. Edie Synovial joint fluid showed elevated white blood cells and neutrophils. She was treated with ceftriaxone 2 g IV one-time followed by vancomycin. Had joint washout procedure via orthopedic arthroscopy on November 3 Wound VAC applied. Preliminary blood cultures were negative Discharged with cefadroxil and doxycycline  for 4 weeks.   During hospital admission she was found to have elevated blood pressure and started on low-dose amlodipine Of note on October 17 she had been reduced on her blood pressure medication given hypotension at home.  She had felt this was secondary to her macular degeneration eyedrops.   Today she reports doing well.  Good po intake. Tolerating antibiotics.. no SE. Plan 4 weeks.  No fever.  Blood sugars 121 his AM. Pain control tolerable.  130/65  Having PT come to house.   Has appt for suture removal  06/22/2024 BP Readings from Last 3 Encounters:  06/19/24 (!) 146/58  06/13/24 (!) 155/61  05/25/24 (!) 171/83    Relevant past medical, surgical, family and social history reviewed and updated as indicated. Interim medical history since our last visit reviewed. Allergies and medications reviewed and updated. Outpatient Medications Prior to Visit  Medication Sig Dispense Refill   Accu-Chek Softclix Lancets lancets TEST BLOOD SUGAR TWICE DAILY 200 each 3   acetaminophen  (TYLENOL ) 325 MG  tablet Take 2 tablets (650 mg total) by mouth every 6 (six) hours as needed for mild pain (pain score 1-3), fever or headache (or Fever >/= 101). 100 tablet 0   Alcohol Swabs (DROPSAFE ALCOHOL PREP) 70 % PADS USE TWO TIMES DAILY 200 each 3   amLODipine (NORVASC) 5 MG tablet Take 1 tablet (5 mg total) by mouth daily. 30 tablet 1   aspirin  EC 325 MG tablet Take 1 tablet (325 mg total) by mouth daily. 30 tablet 1   Blood Glucose Calibration (ACCU-CHEK AVIVA) SOLN Use to check controls on glucose meter 1 each 3   Blood Glucose Monitoring Suppl (ACCU-CHEK AVIVA PLUS) w/Device KIT Use to check blood sugar two times a day 1 kit 0   brimonidine-timolol  (COMBIGAN) 0.2-0.5 % ophthalmic solution Apply 1 drop to eye 2 (two) times daily.     cefadroxil (DURICEF) 500 MG capsule Take 1 capsule (500 mg total) by mouth 2 (two) times daily for 28 days. 56 capsule 0   desipramine  (NORPRAMIN ) 25 MG tablet Take 1 tablet (25 mg total) by mouth at bedtime. 90 tablet 1   docusate sodium (COLACE) 100 MG capsule Take 1 capsule (100 mg total) by mouth 2 (two) times daily. 10 capsule 0   doxycycline  (VIBRA -TABS) 100 MG tablet Take 1 tablet (100 mg total) by mouth every 12 (twelve) hours for 28 days. 56 tablet 0   ezetimibe  (ZETIA ) 10 MG tablet Take 1 tablet (10 mg total) by mouth daily.  90 tablet 3   glucose blood (ACCU-CHEK AVIVA PLUS) test strip TEST BLOOD SUGAR TWICE DAILY 200 strip 0   hydrocortisone  (ANUSOL -HC) 2.5 % rectal cream Place 1 Application rectally 2 (two) times daily. (Patient taking differently: Place 1 Application rectally 2 (two) times daily. Patient states takes as needed.) 30 g 1   latanoprost  (XALATAN ) 0.005 % ophthalmic solution Place 1 drop into both eyes at bedtime.     metFORMIN  (GLUCOPHAGE -XR) 500 MG 24 hr tablet Take 1 tablet (500 mg total) by mouth 2 (two) times daily with a meal. 180 tablet 3   metoprolol  tartrate (LOPRESSOR ) 25 MG tablet Take 0.5 tablets (12.5 mg total) by mouth 2 (two) times  daily. 60 tablet 0   Multiple Vitamins-Minerals (PRESERVISION AREDS PO) Take by mouth.     traMADol  (ULTRAM ) 50 MG tablet Take 1-2 tablets (50-100 mg total) by mouth every 6 (six) hours as needed for moderate pain (pain score 4-6) or severe pain (pain score 7-10). 30 tablet 0   No facility-administered medications prior to visit.     Per HPI unless specifically indicated in ROS section below Review of Systems  Constitutional:  Negative for fatigue and fever.  HENT:  Negative for congestion.   Eyes:  Negative for pain.  Respiratory:  Negative for cough and shortness of breath.   Cardiovascular:  Negative for chest pain, palpitations and leg swelling.  Gastrointestinal:  Negative for abdominal pain.  Genitourinary:  Negative for dysuria and vaginal bleeding.  Musculoskeletal:  Negative for back pain.  Neurological:  Negative for syncope, light-headedness and headaches.  Psychiatric/Behavioral:  Negative for dysphoric mood.    Objective:  BP (!) 146/58   Pulse 63   Temp 99 F (37.2 C) (Oral)   Ht 5' 0.75 (1.543 m)   Wt 165 lb 4 oz (75 kg)   SpO2 96%   BMI 31.48 kg/m   Wt Readings from Last 3 Encounters:  06/19/24 165 lb 4 oz (75 kg)  06/11/24 160 lb (72.6 kg)  05/25/24 167 lb 2 oz (75.8 kg)      Physical Exam Constitutional:      General: She is not in acute distress.    Appearance: Normal appearance. She is well-developed. She is not ill-appearing or toxic-appearing.  HENT:     Head: Normocephalic.     Right Ear: Hearing, tympanic membrane, ear canal and external ear normal. Tympanic membrane is not erythematous, retracted or bulging.     Left Ear: Hearing, tympanic membrane, ear canal and external ear normal. Tympanic membrane is not erythematous, retracted or bulging.     Nose: No mucosal edema or rhinorrhea.     Right Sinus: No maxillary sinus tenderness or frontal sinus tenderness.     Left Sinus: No maxillary sinus tenderness or frontal sinus tenderness.      Mouth/Throat:     Pharynx: Uvula midline.  Eyes:     General: Lids are normal. Lids are everted, no foreign bodies appreciated.     Conjunctiva/sclera: Conjunctivae normal.     Pupils: Pupils are equal, round, and reactive to light.  Neck:     Thyroid : No thyroid  mass or thyromegaly.     Vascular: No carotid bruit.     Trachea: Trachea normal.  Cardiovascular:     Rate and Rhythm: Normal rate and regular rhythm.     Pulses: Normal pulses.     Heart sounds: Normal heart sounds, S1 normal and S2 normal. No murmur heard.    No friction  rub. No gallop.  Pulmonary:     Effort: Pulmonary effort is normal. No tachypnea or respiratory distress.     Breath sounds: Normal breath sounds. No decreased breath sounds, wheezing, rhonchi or rales.  Abdominal:     General: Bowel sounds are normal.     Palpations: Abdomen is soft.     Tenderness: There is no abdominal tenderness.  Musculoskeletal:     Cervical back: Normal range of motion and neck supple.  Skin:    General: Skin is warm and dry.     Findings: No rash.  Neurological:     Mental Status: She is alert.  Psychiatric:        Mood and Affect: Mood is not anxious or depressed.        Speech: Speech normal.        Behavior: Behavior normal. Behavior is cooperative.        Thought Content: Thought content normal.        Judgment: Judgment normal.       Results for orders placed or performed during the hospital encounter of 06/10/24  Lactic acid, plasma   Collection Time: 06/10/24  7:54 AM  Result Value Ref Range   Lactic Acid, Venous 2.5 (HH) 0.5 - 1.9 mmol/L  Comprehensive metabolic panel   Collection Time: 06/10/24  7:54 AM  Result Value Ref Range   Sodium 134 (L) 135 - 145 mmol/L   Potassium 3.7 3.5 - 5.1 mmol/L   Chloride 98 98 - 111 mmol/L   CO2 23 22 - 32 mmol/L   Glucose, Bld 168 (H) 70 - 99 mg/dL   BUN 22 8 - 23 mg/dL   Creatinine, Ser 8.85 (H) 0.44 - 1.00 mg/dL   Calcium 9.0 8.9 - 89.6 mg/dL   Total Protein 7.1 6.5  - 8.1 g/dL   Albumin 3.3 (L) 3.5 - 5.0 g/dL   AST 25 15 - 41 U/L   ALT 10 0 - 44 U/L   Alkaline Phosphatase 52 38 - 126 U/L   Total Bilirubin 0.7 0.0 - 1.2 mg/dL   GFR, Estimated 49 (L) >60 mL/min   Anion gap 13 5 - 15  CBC with Differential   Collection Time: 06/10/24  7:54 AM  Result Value Ref Range   WBC 16.1 (H) 4.0 - 10.5 K/uL   RBC 3.81 (L) 3.87 - 5.11 MIL/uL   Hemoglobin 11.4 (L) 12.0 - 15.0 g/dL   HCT 66.6 (L) 63.9 - 53.9 %   MCV 87.4 80.0 - 100.0 fL   MCH 29.9 26.0 - 34.0 pg   MCHC 34.2 30.0 - 36.0 g/dL   RDW 87.0 88.4 - 84.4 %   Platelets 318 150 - 400 K/uL   nRBC 0.0 0.0 - 0.2 %   Neutrophils Relative % 72 %   Neutro Abs 11.7 (H) 1.7 - 7.7 K/uL   Lymphocytes Relative 14 %   Lymphs Abs 2.2 0.7 - 4.0 K/uL   Monocytes Relative 13 %   Monocytes Absolute 2.1 (H) 0.1 - 1.0 K/uL   Eosinophils Relative 0 %   Eosinophils Absolute 0.0 0.0 - 0.5 K/uL   Basophils Relative 1 %   Basophils Absolute 0.1 0.0 - 0.1 K/uL   Immature Granulocytes 0 %   Abs Immature Granulocytes 0.06 0.00 - 0.07 K/uL  POC CBG, ED   Collection Time: 06/10/24  7:57 AM  Result Value Ref Range   Glucose-Capillary 155 (H) 70 - 99 mg/dL  Sedimentation rate   Collection  Time: 06/10/24  8:30 AM  Result Value Ref Range   Sed Rate 47 (H) 0 - 30 mm/hr  C-reactive protein   Collection Time: 06/10/24  8:30 AM  Result Value Ref Range   CRP 12.9 (H) <1.0 mg/dL  Procalcitonin   Collection Time: 06/10/24  8:30 AM  Result Value Ref Range   Procalcitonin <0.10 ng/mL  Body fluid culture w Gram Stain   Collection Time: 06/10/24  8:31 AM   Specimen: KNEE; Body Fluid  Result Value Ref Range   Specimen Description      KNEE Performed at [facility], [address], [place], [place] [address]    Special Requests      NONE Performed at [facility], [address] [place], [place] [address]    Gram Stain      MODERATE WBC PRESENT, PREDOMINANTLY PMN NO ORGANISMS SEEN    Culture       NO GROWTH 3 DAYS Performed at [facility], [address], [place], [place] [address]    Report Status 06/13/2024 FINAL   Synovial cell count + diff, w/ crystals   Collection Time: 06/10/24  8:31 AM  Result Value Ref Range   Color, Synovial YELLOW YELLOW   Appearance-Synovial CLOUDY (A) CLEAR   Crystals, Fluid NO CRYSTALS SEEN    WBC, Synovial 26,775 (H) 0 - 200 /cu mm   Neutrophil, Synovial 90 (H) 0 - 25 %   Lymphocytes-Synovial Fld 0 0 - 20 %   Monocyte-Macrophage-Synovial Fluid 10 (L) 50 - 90 %   Eosinophils-Synovial 0 0 - 1 %  Lactic acid, plasma   Collection Time: 06/10/24  9:18 AM  Result Value Ref Range   Lactic Acid, Venous 1.3 0.5 - 1.9 mmol/L  Blood culture (routine x 2)   Collection Time: 06/10/24  1:49 PM   Specimen: BLOOD  Result Value Ref Range   Specimen Description BLOOD LEFT ANTECUBITAL    Special Requests      BOTTLES DRAWN AEROBIC AND ANAEROBIC Blood Culture adequate volume   Culture      NO GROWTH 5 DAYS Performed at [facility], [address] [place], [place] [address]    Report Status 06/15/2024 FINAL   Blood culture (routine x 2)   Collection Time: 06/10/24  1:57 PM   Specimen: BLOOD  Result Value Ref Range   Specimen Description BLOOD BLOOD LEFT WRIST    Special Requests      BOTTLES DRAWN AEROBIC AND ANAEROBIC Blood Culture adequate volume   Culture      NO GROWTH 5 DAYS Performed at [facility], [address] [place], [place] [address]    Report Status 06/15/2024 FINAL   Glucose, capillary   Collection Time: 06/10/24  4:01 PM  Result Value Ref Range   Glucose-Capillary 154 (H) 70 - 99 mg/dL  Glucose, capillary   Collection Time: 06/10/24  5:47 PM  Result Value Ref Range   Glucose-Capillary 119 (H) 70 - 99 mg/dL  Glucose, capillary   Collection Time: 06/10/24  9:00 PM  Result Value Ref Range   Glucose-Capillary 135 (H) 70 - 99 mg/dL   Comment 1 Notify RN   Surgical pathology   Collection Time: 06/11/24  12:00 AM  Result Value Ref Range   SURGICAL PATHOLOGY      SURGICAL PATHOLOGY [facility] [address], [address] [place], [place] [address] Telephone [phone] or [phone] Fax [phone]  REPORT OF SURGICAL  PATHOLOGY   Accession #: [id number] Patient Name: VONDELL, SOWELL Visit # : [id number]  MRN: [id number] Physician: Edie Rush DOB/Age [birth year] (Age: 52) Gender: F Collected Date: 06/11/2024 Received Date: 06/12/2024  FINAL DIAGNOSIS       1. Synovium, cyst, right knee cyst :       SYNOVIUM WITH EXTENSIVE HEMORRHAGE AND HEMOSIDERIN DEPOSITION WITH INFLAMMATION.       DATE SIGNED OUT: 06/13/2024 ELECTRONIC SIGNATURE : Belvie Come, John, Pathologist, Electronic Signature  MICROSCOPIC DESCRIPTION  CASE COMMENTS STAINS USED IN DIAGNOSIS: H&E H&E    CLINICAL HISTORY  SPECIMEN(S) OBTAINED 1. Synovium, cyst, Right Knee Cyst  SPECIMEN COMMENTS: SPECIMEN CLINICAL INFORMATION: 1. Right knee infection    Gross Description 1. Received fresh and placed in formalin is a 1.7 x  1.6 x 1.2 cm aggregate of tan-brown, firm tissue. Exterior is inked blue, the cut surface is red-brown and dense. The specimen is sectioned and submitted entirely in 2 blocks. mb 06-12-24        Report signed out from the following location(s) [place] [address] [place], [place] [address] CLIA #: [id number]  [facility] [address] [place], [place] [address] CLIA #: [id number]   Basic metabolic panel   Collection Time: 06/11/24  3:56 AM  Result Value Ref Range   Sodium 135 135 - 145 mmol/L   Potassium 4.5 3.5 - 5.1 mmol/L   Chloride 100 98 - 111 mmol/L   CO2 21 (L) 22 - 32 mmol/L   Glucose, Bld 131 (H) 70 - 99 mg/dL   BUN 21 8 - 23 mg/dL   Creatinine, Ser 8.77 (H) 0.44 - 1.00 mg/dL   Calcium 9.2 8.9 - 89.6 mg/dL   GFR, Estimated 45 (L) >60 mL/min   Anion gap 14 5 - 15  CBC   Collection Time:  06/11/24  3:56 AM  Result Value Ref Range   WBC 14.3 (H) 4.0 - 10.5 K/uL   RBC 4.32 3.87 - 5.11 MIL/uL   Hemoglobin 12.8 12.0 - 15.0 g/dL   HCT 61.4 63.9 - 53.9 %   MCV 89.1 80.0 - 100.0 fL   MCH 29.6 26.0 - 34.0 pg   MCHC 33.2 30.0 - 36.0 g/dL   RDW 86.7 88.4 - 84.4 %   Platelets 337 150 - 400 K/uL   nRBC 0.0 0.0 - 0.2 %  Glucose, capillary   Collection Time: 06/11/24  8:46 AM  Result Value Ref Range   Glucose-Capillary 119 (H) 70 - 99 mg/dL  Glucose, capillary   Collection Time: 06/11/24 12:11 PM  Result Value Ref Range   Glucose-Capillary 120 (H) 70 - 99 mg/dL  Glucose, capillary   Collection Time: 06/11/24 12:37 PM  Result Value Ref Range   Glucose-Capillary 106 (H) 70 - 99 mg/dL  Aerobic/Anaerobic Culture w Gram Stain (surgical/deep wound)   Collection Time: 06/11/24  2:14 PM   Specimen: Synovial, Right Knee; Body Fluid  Result Value Ref Range   Specimen Description      SYNOVIAL Performed at [facility], [address] [place], [place] [address]    Special Requests      RIGHT KNEE Performed at [facility], [address] [place], [place] [address]    Gram Stain      FEW WBC PRESENT,BOTH PMN AND MONONUCLEAR NO ORGANISMS SEEN    Culture      No growth aerobically or anaerobically. Performed at [facility], [address] [place], [place] [address]    Report Status 06/16/2024 FINAL   Glucose, capillary   Collection Time: 06/11/24  3:14 PM  Result Value Ref Range   Glucose-Capillary 124 (H) 70 - 99 mg/dL   Comment 1 Notify RN    Comment 2 Document in Chart   Glucose, capillary   Collection Time: 06/11/24  4:49 PM  Result Value Ref Range   Glucose-Capillary 139 (H) 70 - 99 mg/dL  Glucose, capillary   Collection Time: 06/11/24  8:52 PM  Result Value Ref Range   Glucose-Capillary 263 (H) 70 - 99 mg/dL  CBC   Collection Time: 06/12/24  6:11 AM  Result Value Ref Range   WBC 14.8 (H) 4.0 - 10.5 K/uL   RBC 3.58 (L) 3.87 - 5.11  MIL/uL   Hemoglobin 10.7 (L) 12.0 - 15.0 g/dL   HCT 68.0 (L) 63.9 - 53.9 %   MCV 89.1 80.0 - 100.0 fL   MCH 29.9 26.0 - 34.0 pg   MCHC 33.5 30.0 - 36.0 g/dL   RDW 86.8 88.4 - 84.4 %   Platelets 311 150 - 400 K/uL   nRBC 0.0 0.0 - 0.2 %  Basic metabolic panel with GFR   Collection Time: 06/12/24  6:11 AM  Result Value Ref Range   Sodium 138 135 - 145 mmol/L   Potassium 4.1 3.5 - 5.1 mmol/L   Chloride 104 98 - 111 mmol/L   CO2 21 (L) 22 - 32 mmol/L   Glucose, Bld 132 (H) 70 - 99 mg/dL   BUN 27 (H) 8 - 23 mg/dL   Creatinine, Ser 8.66 (H) 0.44 - 1.00 mg/dL   Calcium 8.4 (L) 8.9 - 10.3 mg/dL   GFR, Estimated 41 (L) >60 mL/min   Anion gap 13 5 - 15  Glucose, capillary   Collection Time: 06/12/24  8:29 AM  Result Value Ref Range   Glucose-Capillary 110 (H) 70 - 99 mg/dL  Glucose, capillary   Collection Time: 06/12/24 11:32 AM  Result Value Ref Range   Glucose-Capillary 183 (H) 70 - 99 mg/dL  Glucose, capillary   Collection Time: 06/12/24  4:55 PM  Result Value Ref Range   Glucose-Capillary 112 (H) 70 - 99 mg/dL  Glucose, capillary   Collection Time: 06/12/24  9:38 PM  Result Value Ref Range   Glucose-Capillary 166 (H) 70 - 99 mg/dL  CBC   Collection Time: 06/13/24  4:07 AM  Result Value Ref Range   WBC 13.9 (H) 4.0 - 10.5 K/uL   RBC 3.42 (L) 3.87 - 5.11 MIL/uL   Hemoglobin 10.3 (L) 12.0 - 15.0 g/dL   HCT 69.8 (L) 63.9 - 53.9 %   MCV 88.0 80.0 - 100.0 fL   MCH 30.1 26.0 - 34.0 pg   MCHC 34.2 30.0 - 36.0 g/dL   RDW 86.7 88.4 - 84.4 %   Platelets 335 150 - 400 K/uL   nRBC 0.0 0.0 - 0.2 %  Basic metabolic panel with GFR   Collection Time: 06/13/24  4:07 AM  Result Value Ref Range   Sodium 135 135 - 145 mmol/L   Potassium 4.2 3.5 - 5.1 mmol/L   Chloride 103 98 - 111 mmol/L   CO2 23 22 - 32 mmol/L   Glucose, Bld 137 (H) 70 - 99 mg/dL   BUN 28 (H) 8 - 23 mg/dL   Creatinine, Ser 8.78 (H) 0.44 - 1.00 mg/dL   Calcium 8.0 (L) 8.9 - 10.3 mg/dL   GFR,  Estimated 46 (L) >60  mL/min   Anion gap 9 5 - 15  Glucose, capillary   Collection Time: 06/13/24  7:58 AM  Result Value Ref Range   Glucose-Capillary 136 (H) 70 - 99 mg/dL  Glucose, capillary   Collection Time: 06/13/24 11:30 AM  Result Value Ref Range   Glucose-Capillary 188 (H) 70 - 99 mg/dL    Assessment and Plan  S/P arthroscopic knee surgery Assessment & Plan: Postop pain tolerable.  Receiving physical therapy at home, using walker.  Back to normal p.o. intake.   Pyogenic arthritis of right knee joint, due to unspecified organism [facility]) Assessment & Plan: Synovial culture returned negative but this does not preclude septic joint. She will complete 4 weeks of cefadroxil and doxycycline . She has an appointment for wound check in 2 days.  Orders: -     CBC with Differential/Platelet -     Basic metabolic panel with GFR  Hypertension associated with diabetes (HCC)  Type 2 diabetes mellitus with stage 3a chronic kidney disease, without long-term current use of insulin  (HCC) Assessment & Plan: Chronic, stable control despite recent infection and surgery.  Metformin  XR 500 mg p.o. twice daily     No follow-ups on file.   Greig Ring, MD

## 2024-06-19 NOTE — Assessment & Plan Note (Signed)
 Chronic, recent hypotension may have been secondary to ongoing infection. Blood pressure now in normal range on current regimen including low-dose metoprolol  and amlodipine.

## 2024-06-19 NOTE — Assessment & Plan Note (Signed)
 Synovial culture returned negative but this does not preclude septic joint. She will complete 4 weeks of cefadroxil and doxycycline . She has an appointment for wound check in 2 days.

## 2024-06-19 NOTE — Assessment & Plan Note (Signed)
 Chronic, stable control despite recent infection and surgery.  Metformin  XR 500 mg p.o. twice daily

## 2024-06-20 ENCOUNTER — Ambulatory Visit: Payer: Self-pay | Admitting: Family Medicine

## 2024-06-20 DIAGNOSIS — E1169 Type 2 diabetes mellitus with other specified complication: Secondary | ICD-10-CM | POA: Diagnosis not present

## 2024-06-20 LAB — CBC WITH DIFFERENTIAL/PLATELET
Basophils Absolute: 0.1 K/uL (ref 0.0–0.1)
Basophils Relative: 1.1 % (ref 0.0–3.0)
Eosinophils Absolute: 1.4 K/uL — ABNORMAL HIGH (ref 0.0–0.7)
Eosinophils Relative: 10.4 % — ABNORMAL HIGH (ref 0.0–5.0)
HCT: 33.6 % — ABNORMAL LOW (ref 36.0–46.0)
Hemoglobin: 11.4 g/dL — ABNORMAL LOW (ref 12.0–15.0)
Lymphocytes Relative: 24 % (ref 12.0–46.0)
Lymphs Abs: 3.2 K/uL (ref 0.7–4.0)
MCHC: 33.9 g/dL (ref 30.0–36.0)
MCV: 88.2 fl (ref 78.0–100.0)
Monocytes Absolute: 1 K/uL (ref 0.1–1.0)
Monocytes Relative: 7.9 % (ref 3.0–12.0)
Neutro Abs: 7.5 K/uL (ref 1.4–7.7)
Neutrophils Relative %: 56.6 % (ref 43.0–77.0)
Platelets: 653 K/uL — ABNORMAL HIGH (ref 150.0–400.0)
RBC: 3.8 Mil/uL — ABNORMAL LOW (ref 3.87–5.11)
RDW: 13.4 % (ref 11.5–15.5)
WBC: 13.2 K/uL — ABNORMAL HIGH (ref 4.0–10.5)

## 2024-06-20 LAB — BASIC METABOLIC PANEL WITH GFR
BUN: 27 mg/dL — ABNORMAL HIGH (ref 6–23)
CO2: 28 meq/L (ref 19–32)
Calcium: 10.6 mg/dL — ABNORMAL HIGH (ref 8.4–10.5)
Chloride: 96 meq/L (ref 96–112)
Creatinine, Ser: 1.22 mg/dL — ABNORMAL HIGH (ref 0.40–1.20)
GFR: 42.61 mL/min — ABNORMAL LOW (ref >60.00)
Glucose, Bld: 88 mg/dL (ref 70–99)
Potassium: 5 meq/L (ref 3.5–5.1)
Sodium: 133 meq/L — ABNORMAL LOW (ref 135–145)

## 2024-06-21 ENCOUNTER — Ambulatory Visit: Admitting: Physician Assistant

## 2024-06-21 DIAGNOSIS — Z1331 Encounter for screening for depression: Secondary | ICD-10-CM | POA: Diagnosis not present

## 2024-06-21 DIAGNOSIS — R413 Other amnesia: Secondary | ICD-10-CM | POA: Diagnosis not present

## 2024-06-21 DIAGNOSIS — F32A Depression, unspecified: Secondary | ICD-10-CM | POA: Diagnosis not present

## 2024-06-22 ENCOUNTER — Ambulatory Visit: Admitting: Physician Assistant

## 2024-06-25 ENCOUNTER — Other Ambulatory Visit: Payer: Self-pay | Admitting: Neurology

## 2024-06-25 DIAGNOSIS — R413 Other amnesia: Secondary | ICD-10-CM

## 2024-06-25 NOTE — Progress Notes (Signed)
 The patient's right knee was arthroscopically irrigated and debrided due to sufficient clinical concern for a septic arthritis of her right knee.  During this procedure, a wad of synovial tissue was noted in the suprapatellar fossa suspicious for a pigmented villonodular synovitis.  Therefore, it was removed piecemeal and portions sent to pathology for further evaluation and treatment.    Based on the joint fluid aspirated and drained at the time of surgery, I do not feel that there is a significant likelihood for an acute hemarthrosis accounting for her symptoms.  Therefore, I feel that hemarthrosis would not be considered a clinically significant diagnosis in this patient's case.  Thank you!

## 2024-06-27 ENCOUNTER — Telehealth: Payer: Self-pay | Admitting: Family Medicine

## 2024-06-27 ENCOUNTER — Other Ambulatory Visit: Payer: Self-pay | Admitting: Family Medicine

## 2024-06-27 NOTE — Telephone Encounter (Unsigned)
 Copied from CRM [id number]. Topic: Clinical - Refused Triage >> Jun 27, 2024 11:38 AM Eva FALCON wrote: Patient/caller voiced complaints of right knee pain. Had surgery. Requested refill for tramadol  which I went ahead and sent. Declined transfer to triage. States she is already doing PT, just needs to walk and let it heal.

## 2024-06-27 NOTE — Telephone Encounter (Signed)
 Copied from CRM [id number]. Topic: Clinical - Medication Refill >> Jun 27, 2024 11:35 AM Eva FALCON wrote: Medication: traMADol  (ULTRAM ) 50 MG tablet  Has the patient contacted their pharmacy? Yes, can't get through to them. (Agent: If no, request that the patient contact the pharmacy for the refill. If patient does not wish to contact the pharmacy document the reason why and proceed with request.) (Agent: If yes, when and what did the pharmacy advise?)  This is the patient's preferred pharmacy:  [id number] - [place], [place] - [address] [place] [address] Phone: [phone] Fax: [phone]  Is this the correct pharmacy for this prescription? Yes If no, delete pharmacy and type the correct one.   Has the prescription been filled recently? Yes  Is the patient out of the medication? No  Has the patient been seen for an appointment in the last year OR does the patient have an upcoming appointment? Yes  Can we respond through MyChart? Yes  Agent: Please be advised that Rx refills may take up to 3 business days. We ask that you follow-up with your pharmacy.

## 2024-06-28 DIAGNOSIS — E1122 Type 2 diabetes mellitus with diabetic chronic kidney disease: Secondary | ICD-10-CM | POA: Diagnosis not present

## 2024-06-28 MED ORDER — TRAMADOL HCL 50 MG PO TABS: 50.0000 mg | ORAL_TABLET | Freq: Four times a day (QID) | ORAL | 0 refills | Status: DC | PRN

## 2024-06-28 NOTE — Telephone Encounter (Signed)
 See refill request for Tramadol .

## 2024-06-28 NOTE — Telephone Encounter (Signed)
 Last office visit 06/19/24 for hospital follow up.  Last refilled 06/13/24 for #30 with no refills.  Next appt: No future appointments with PCP.

## 2024-06-28 NOTE — Telephone Encounter (Signed)
 Duplicate request

## 2024-06-29 ENCOUNTER — Ambulatory Visit
Admission: RE | Admit: 2024-06-29 | Discharge: 2024-06-29 | Disposition: A | Discharge status: Home / Self Care | Source: Ambulatory Visit | Attending: Neurology | Admitting: Neurology

## 2024-06-29 DIAGNOSIS — R9089 Other abnormal findings on diagnostic imaging of central nervous system: Secondary | ICD-10-CM | POA: Diagnosis not present

## 2024-06-29 DIAGNOSIS — I6782 Cerebral ischemia: Secondary | ICD-10-CM | POA: Diagnosis not present

## 2024-06-29 DIAGNOSIS — G9389 Other specified disorders of brain: Secondary | ICD-10-CM | POA: Diagnosis not present

## 2024-06-29 DIAGNOSIS — R413 Other amnesia: Secondary | ICD-10-CM | POA: Diagnosis not present

## 2024-06-30 ENCOUNTER — Other Ambulatory Visit: Payer: Self-pay | Admitting: Family Medicine

## 2024-07-09 ENCOUNTER — Ambulatory Visit: Payer: Self-pay

## 2024-07-09 NOTE — Telephone Encounter (Signed)
 FYI Only or Action Required?: Action required by provider: medication refill request and update on patient condition.  Patient was last seen in primary care on 06/19/2024 by Avelina Greig BRAVO, MD.  Called Nurse Triage reporting Knee Pain and Medication Refill.  Symptoms began a week ago.  Interventions attempted: Prescription medications: tramadol , Rest, hydration, or home remedies, and Ice/heat application.  Symptoms are: unchanged.  Triage Disposition: See PCP When Office is Open (Within 3 Days)  Patient/caregiver understands and will follow disposition?: No, wishes to speak with PCP   Copied from CRM #[id number]. Topic: Clinical - Red Word Triage >> Jul 09, 2024  3:18 PM Cathy Mullins wrote: Red Word that prompted transfer to Nurse Triage: Still having pain in right knee and leg. Recently had surgery. Patient needs traMADol  (ULTRAM ) 50 MG tablet. She stated the hospital will not give her a full prescription. They only give a few days a time.  Pharmacy: [id number] - [place], [place] - [address] [place] [address] Phone: [phone] Fax: [phone] Hours: Not open 24 hours Reason for Disposition  [1] MODERATE pain (e.g., interferes with normal activities, limping) AND [2] present > 3 days  Answer Assessment - Initial Assessment Questions 1. LOCATION and RADIATION: Where is the pain located?      Right knee  2. QUALITY: What does the pain feel like?  (e.g., sharp, dull, aching, burning)     Feels like a tearing pain  3. SEVERITY: How bad is the pain? What does it keep you from doing?   (Scale 1-10; or mild, moderate, severe)     Moderate; able to walk, worse when she goes outside in the cold weather  4. ONSET: When did the pain start? Does it come and go, or is it there all the time?  Over the past month knee has been painful d/t surgery and PT. Last week pt went to stretch and blew knee. She stated she woke up and stretched then felt immense pain  behind her knee that has not resolved.       5. RECURRENT: Have you had this pain before? If Yes, ask: When, and what happened then?     No   6. SETTING: Has there been any recent work, exercise or other activity that involved that part of the body?      Yes PT; pt using a walker and cane    7. AGGRAVATING FACTORS: What makes the knee pain worse? (e.g., walking, climbing stairs, running)     Able to walk; unknown about stairs. Using a walker/ cane   8. ASSOCIATED SYMPTOMS: Is there any swelling or redness of the knee?     Back of the leg swells if pt on her feet too long but does resolve; pt using ice/heat and elevation   9. OTHER SYMPTOMS: Do you have any other symptoms? (e.g., calf pain, chest pain, difficulty breathing, fever)     Calf pain- not warm to touch or discolored  Protocols used: Knee Pain-A-AH

## 2024-07-10 MED ORDER — TRAMADOL HCL 50 MG PO TABS: 50.0000 mg | ORAL_TABLET | Freq: Four times a day (QID) | ORAL | 0 refills | Status: DC | PRN

## 2024-07-10 NOTE — Telephone Encounter (Signed)
 Refill on Tramadol  printed.  [facility] [place] at [phone].

## 2024-07-12 DIAGNOSIS — M25461 Effusion, right knee: Secondary | ICD-10-CM | POA: Diagnosis not present

## 2024-07-16 ENCOUNTER — Other Ambulatory Visit: Payer: Self-pay | Admitting: Family Medicine

## 2024-07-18 ENCOUNTER — Other Ambulatory Visit: Payer: Self-pay | Admitting: Family Medicine

## 2024-07-18 NOTE — Telephone Encounter (Signed)
 Copied from CRM #[id number]. Topic: Clinical - Medication Refill >> Jul 18, 2024  1:37 PM Dedra B wrote: Medication: amLODipine  (NORVASC ) 5 MG tablet 90 day supply. Prescribed while pt was in the hospital.   Has the patient contacted their pharmacy? Yes, pharmacy needs new prescription  This is the patient's preferred pharmacy:  [facility] - [place], [place] - [address] [place] [address] Phone: [phone] Fax: [phone]  Is this the correct pharmacy for this prescription? Yes  Has the prescription been filled recently? No  Is the patient out of the medication? No  Has the patient been seen for an appointment in the last year OR does the patient have an upcoming appointment? Yes  Can we respond through MyChart? No  Agent: Please be advised that Rx refills may take up to 3 business days. We ask that you follow-up with your pharmacy.

## 2024-07-19 MED ORDER — AMLODIPINE BESYLATE 5 MG PO TABS: 5.0000 mg | ORAL_TABLET | Freq: Every day | ORAL | 1 refills | Status: AC

## 2024-07-23 ENCOUNTER — Other Ambulatory Visit: Payer: Self-pay | Admitting: Family Medicine

## 2024-07-23 MED ORDER — METOPROLOL TARTRATE 25 MG PO TABS: 12.5000 mg | ORAL_TABLET | Freq: Two times a day (BID) | ORAL | 1 refills | Status: AC

## 2024-07-31 ENCOUNTER — Other Ambulatory Visit: Payer: Self-pay | Admitting: Family Medicine

## 2024-07-31 NOTE — Telephone Encounter (Signed)
 Copied from CRM #[id number]. Topic: Clinical - Medication Refill >> Jul 31, 2024  8:14 AM Burnard DEL wrote: Medication: traMADol  (ULTRAM ) 50 MG tablet  Has the patient contacted their pharmacy? Yes (Agent: If no, request that the patient contact the pharmacy for the refill. If patient does not wish to contact the pharmacy document the reason why and proceed with request.) (Agent: If yes, when and what did the pharmacy advise?)  This is the patient's preferred pharmacy:    [id number] - [place], [place] - [address] [place] [address] Phone: [phone] Fax: [phone]    Is this the correct pharmacy for this prescription? Yes If no, delete pharmacy and type the correct one.   Has the prescription been filled recently? No  Is the patient out of the medication? No(4 pills left)  Has the patient been seen for an appointment in the last year OR does the patient have an upcoming appointment? Yes  Can we respond through MyChart? Yes  Agent: Please be advised that Rx refills may take up to 3 business days. We ask that you follow-up with your pharmacy.

## 2024-08-01 MED ORDER — TRAMADOL HCL 50 MG PO TABS: 50.0000 mg | ORAL_TABLET | Freq: Four times a day (QID) | ORAL | 0 refills | Status: DC | PRN

## 2024-08-03 ENCOUNTER — Other Ambulatory Visit: Payer: Self-pay | Admitting: Family Medicine

## 2024-08-17 ENCOUNTER — Telehealth: Payer: Self-pay | Admitting: *Deleted

## 2024-08-17 NOTE — Telephone Encounter (Signed)
 Next Appt With Family Medicine Darra Ring, MD) 08/21/2024 at 3:20 PM

## 2024-08-17 NOTE — Telephone Encounter (Signed)
 Please triage

## 2024-08-17 NOTE — Telephone Encounter (Signed)
 Copied from CRM #[id number]. Topic: Clinical - Medical Advice >> Aug 17, 2024 10:00 AM Burnard DEL wrote: Reason for CRM: Patient called in stating that she has horrible allergies and her nose keeps running. She stated that she has high b/p and diabetes.She would like to know is there anything safe that she can take over the counter for these symptoms? Please advise.

## 2024-08-17 NOTE — Telephone Encounter (Signed)
 I spoke with pt; for 1 month has had runny nose; for last 3- 4 days runny nose is clear mucus and constant.  Pt sneezing alot. Pt feels winded upon exertion; ? Fever. No CP,S/T, H/A, cough, and no facial pain. Nasacort  is not helping symptoms.Offered pt appt but pt only wants to see Dr Avelina.pt scheduled appt to see Dr Avelina on 08/21/24 at 3:20 with UC & ED precautions and pt voiced understanding. Pt request cb after note reviewed by Dr Avelina; pt wants to know if she can take Benadryl  or what would Dr Avelina suggest pt taking prior to 08/21/24 appt. Sending note to Dr Avelina and B edsole pool.

## 2024-08-17 NOTE — Telephone Encounter (Signed)
 Left message for Mrs. Turk per Dr. Avelina:  Recommend Mucinex DM, no decongestant. Flonase 2 sprays per nostril daily and Nasal saline spray or irrigation.

## 2024-08-21 ENCOUNTER — Ambulatory Visit
Admission: RE | Admit: 2024-08-21 | Discharge: 2024-08-21 | Disposition: A | Source: Ambulatory Visit | Attending: Family Medicine | Admitting: Family Medicine

## 2024-08-21 ENCOUNTER — Ambulatory Visit: Admitting: Family Medicine

## 2024-08-21 VITALS — BP 140/80 | HR 58 | Temp 98.8°F | Ht 60.75 in | Wt 162.2 lb

## 2024-08-21 DIAGNOSIS — M25512 Pain in left shoulder: Secondary | ICD-10-CM

## 2024-08-21 DIAGNOSIS — J309 Allergic rhinitis, unspecified: Secondary | ICD-10-CM

## 2024-08-21 DIAGNOSIS — K648 Other hemorrhoids: Secondary | ICD-10-CM | POA: Diagnosis not present

## 2024-08-21 DIAGNOSIS — E119 Type 2 diabetes mellitus without complications: Secondary | ICD-10-CM

## 2024-08-21 DIAGNOSIS — E1169 Type 2 diabetes mellitus with other specified complication: Secondary | ICD-10-CM | POA: Diagnosis not present

## 2024-08-21 DIAGNOSIS — E1122 Type 2 diabetes mellitus with diabetic chronic kidney disease: Secondary | ICD-10-CM | POA: Diagnosis not present

## 2024-08-21 DIAGNOSIS — Z23 Encounter for immunization: Secondary | ICD-10-CM

## 2024-08-21 DIAGNOSIS — N1831 Chronic kidney disease, stage 3a: Secondary | ICD-10-CM

## 2024-08-21 DIAGNOSIS — E785 Hyperlipidemia, unspecified: Secondary | ICD-10-CM

## 2024-08-21 DIAGNOSIS — Z7984 Long term (current) use of oral hypoglycemic drugs: Secondary | ICD-10-CM

## 2024-08-21 NOTE — Progress Notes (Signed)
 "   Patient ID: Cathy Mullins, female    DOB: 1946-01-10, 79 y.o.   MRN: [id number]  This visit was conducted in person.  BP (!) 140/80   Pulse (!) 58   Temp 98.8 F (37.1 C) (Temporal)   Ht 5' 0.75 (1.543 m)   Wt 162 lb 4 oz (73.6 kg)   SpO2 99%   BMI 30.91 kg/m    CC:  Chief Complaint  Patient presents with   Sneezing    Got Flonase and now feels fine    Subjective:   HPI: Cathy Mullins is a 79 y.o. female presenting on 08/21/2024 for Sneezing (Got Flonase and now feels fine)    Date of onset: 4 days Initial symptoms included some boxes like things that sneeze Symptoms progressed to runny nose just will not   No face pain, no ear pain.   No itchy eyes.   Sick contacts:  none COVID testing:   none     She has tried to treat with  flonase  ... This has improved symptoms greatly.     No history of chronic lung disease such as asthma or COPD. Non-smoker.  Elevated Cholesterol: No SE now on zetia  10 mg daily.  She wants to recheck now on this for 2.5 months. Using medications without problems: Muscle aches:  Diet compliance: Exercise: Other complaints:    Hx of DM.SABRA due for A1C repeat.  Left arm pain x 3 months. Notes most at night  Has fallen many on left arm over the years.  Hx of fibromyalgia Using tramadol  for chronic back pain.  Relevant past medical, surgical, family and social history reviewed and updated as indicated. Interim medical history since our last visit reviewed. Allergies and medications reviewed and updated. Outpatient Medications Prior to Visit  Medication Sig Dispense Refill   Accu-Chek Softclix Lancets lancets TEST BLOOD SUGAR TWICE DAILY 200 each 3   acetaminophen  (TYLENOL ) 325 MG tablet Take 2 tablets (650 mg total) by mouth every 6 (six) hours as needed for mild pain (pain score 1-3), fever or headache (or Fever >/= 101). 100 tablet 0   Alcohol Swabs (DROPSAFE ALCOHOL PREP) 70 % PADS USE TWO TIMES DAILY 200 each 3   amLODipine   (NORVASC ) 5 MG tablet Take 1 tablet (5 mg total) by mouth daily. 90 tablet 1   aspirin  EC 325 MG tablet Take 1 tablet (325 mg total) by mouth daily. 30 tablet 1   Blood Glucose Calibration (ACCU-CHEK AVIVA) SOLN Use to check controls on glucose meter 1 each 3   Blood Glucose Monitoring Suppl (ACCU-CHEK AVIVA PLUS) w/Device KIT Use to check blood sugar two times a day 1 kit 0   brimonidine -timolol  (COMBIGAN ) 0.2-0.5 % ophthalmic solution Apply 1 drop to eye 2 (two) times daily.     docusate sodium  (COLACE) 100 MG capsule Take 1 capsule (100 mg total) by mouth 2 (two) times daily. 10 capsule 0   ezetimibe  (ZETIA ) 10 MG tablet Take 1 tablet (10 mg total) by mouth daily. 90 tablet 3   glucose blood (ACCU-CHEK AVIVA PLUS) test strip TEST BLOOD SUGAR TWICE DAILY 200 strip 0   hydrocortisone  (ANUSOL -HC) 2.5 % rectal cream Place 1 Application rectally 2 (two) times daily as needed for hemorrhoids or anal itching.     latanoprost  (XALATAN ) 0.005 % ophthalmic solution Place 1 drop into both eyes at bedtime.     metFORMIN  (GLUCOPHAGE -XR) 500 MG 24 hr tablet Take 1 tablet (500 mg total) by  mouth 2 (two) times daily with a meal. 180 tablet 3   metoprolol  tartrate (LOPRESSOR ) 25 MG tablet Take 0.5 tablets (12.5 mg total) by mouth 2 (two) times daily. 90 tablet 1   Multiple Vitamins-Minerals (HAIR SKIN & NAILS PO) Take 5,000 mcg by mouth daily.     Multiple Vitamins-Minerals (ONE A DAY WOMENS MULTI) TABS Take 1 tablet by mouth daily.     Multiple Vitamins-Minerals (PRESERVISION AREDS PO) Take by mouth.     Nutritional Supplements (NUTRITIONAL SUPPLEMENT PO) Take 1 Capful by mouth daily. Pancreatic Enyzmes     Probiotic Product (ADVANCED PROBIOTIC PO) Take 1 capsule by mouth daily.     traMADol  (ULTRAM ) 50 MG tablet TAKE 2 TABLETS TWICE DAILY 120 tablet 0   desipramine  (NORPRAMIN ) 25 MG tablet Take 1 tablet (25 mg total) by mouth at bedtime. 90 tablet 1   hydrocortisone  (ANUSOL -HC) 2.5 % rectal cream Place 1  Application rectally 2 (two) times daily. (Patient taking differently: Place 1 Application rectally 2 (two) times daily. Patient states takes as needed.) 30 g 1   No facility-administered medications prior to visit.     Per HPI unless specifically indicated in ROS section below Review of Systems  Constitutional:  Negative for fatigue and fever.  HENT:  Negative for congestion.   Eyes:  Negative for pain.  Respiratory:  Negative for cough and shortness of breath.   Cardiovascular:  Negative for chest pain, palpitations and leg swelling.  Gastrointestinal:  Negative for abdominal pain.  Genitourinary:  Negative for dysuria and vaginal bleeding.  Musculoskeletal:  Positive for arthralgias and joint swelling. Negative for back pain.  Neurological:  Negative for syncope, light-headedness and headaches.  Psychiatric/Behavioral:  Negative for dysphoric mood.    Objective:  BP (!) 140/80   Pulse (!) 58   Temp 98.8 F (37.1 C) (Temporal)   Ht 5' 0.75 (1.543 m)   Wt 162 lb 4 oz (73.6 kg)   SpO2 99%   BMI 30.91 kg/m   Wt Readings from Last 3 Encounters:  08/21/24 162 lb 4 oz (73.6 kg)  06/19/24 165 lb 4 oz (75 kg)  06/11/24 160 lb (72.6 kg)      Physical Exam Constitutional:      General: She is not in acute distress.    Appearance: Normal appearance. She is well-developed. She is not ill-appearing or toxic-appearing.  HENT:     Head: Normocephalic.     Right Ear: Hearing, tympanic membrane, ear canal and external ear normal. Tympanic membrane is not erythematous, retracted or bulging.     Left Ear: Hearing, tympanic membrane, ear canal and external ear normal. Tympanic membrane is not erythematous, retracted or bulging.     Nose: No mucosal edema or rhinorrhea.     Right Sinus: No maxillary sinus tenderness or frontal sinus tenderness.     Left Sinus: No maxillary sinus tenderness or frontal sinus tenderness.     Mouth/Throat:     Pharynx: Uvula midline.  Eyes:     General:  Lids are normal. Lids are everted, no foreign bodies appreciated.     Conjunctiva/sclera: Conjunctivae normal.     Pupils: Pupils are equal, round, and reactive to light.  Neck:     Thyroid : No thyroid  mass or thyromegaly.     Vascular: No carotid bruit.     Trachea: Trachea normal.  Cardiovascular:     Rate and Rhythm: Normal rate and regular rhythm.     Pulses: Normal pulses.  Heart sounds: Normal heart sounds, S1 normal and S2 normal. No murmur heard.    No friction rub. No gallop.  Pulmonary:     Effort: Pulmonary effort is normal. No tachypnea or respiratory distress.     Breath sounds: Normal breath sounds. No decreased breath sounds, wheezing, rhonchi or rales.  Abdominal:     General: Bowel sounds are normal.     Palpations: Abdomen is soft.     Tenderness: There is no abdominal tenderness.  Musculoskeletal:     Left shoulder: Tenderness and bony tenderness present. Decreased range of motion.     Cervical back: Normal range of motion and neck supple.  Skin:    General: Skin is warm and dry.     Findings: No rash.  Neurological:     Mental Status: She is alert.  Psychiatric:        Mood and Affect: Mood is not anxious or depressed.        Speech: Speech normal.        Behavior: Behavior normal. Behavior is cooperative.        Thought Content: Thought content normal.        Judgment: Judgment normal.       Results for orders placed or performed in visit on 08/21/24  Hemoglobin A1c   Collection Time: 08/21/24  4:16 PM  Result Value Ref Range   Hgb A1c MFr Bld 6.3 4.6 - 6.5 %  Lipid panel   Collection Time: 08/21/24  4:16 PM  Result Value Ref Range   Cholesterol 211 (H) 28 - 200 mg/dL   Triglycerides 782.9 (H) 10.0 - 149.0 mg/dL   HDL 35.79 >60.99 mg/dL   VLDL 56.5 (H) 0.0 - 59.9 mg/dL   LDL Cholesterol 896 (H) 10 - 99 mg/dL   Total CHOL/HDL Ratio 3    NonHDL 146.39     Assessment and Plan  Hyperlipidemia associated with type 2 diabetes mellitus  (HCC) Assessment & Plan: Due for reevaluation after being on new start Zetia  10 mg daily for the last 2 to 3 months.  Orders: -     Lipid panel  Diabetes mellitus treated with oral medication (HCC) -     Hemoglobin A1c  Internal hemorrhoid, bleeding  Type 2 diabetes mellitus with stage 3a chronic kidney disease, without long-term current use of insulin  (HCC) Assessment & Plan: Due for reevaluation   Acute pain of left shoulder Assessment & Plan: Acute, given many falls over the year likely posttraumatic arthritis.  Will evaluate with x-ray today.  Orders: -     DG Shoulder Left; Future  Need for influenza vaccination -     Flu vaccine HIGH DOSE PF(Fluzone Trivalent)  Allergic rhinitis, unspecified seasonality, unspecified trigger Assessment & Plan: Acute, improved significantly with starting Flonase 2 sprays per nostril daily.     No follow-ups on file.   Greig Ring, MD  "

## 2024-08-22 ENCOUNTER — Ambulatory Visit: Payer: Self-pay | Admitting: Family Medicine

## 2024-08-22 LAB — LIPID PANEL
Cholesterol: 211 mg/dL — ABNORMAL HIGH (ref 28–200)
HDL: 64.2 mg/dL
LDL Cholesterol: 103 mg/dL — ABNORMAL HIGH (ref 10–99)
NonHDL: 146.39
Total CHOL/HDL Ratio: 3
Triglycerides: 217 mg/dL — ABNORMAL HIGH (ref 10.0–149.0)
VLDL: 43.4 mg/dL — ABNORMAL HIGH (ref 0.0–40.0)

## 2024-08-22 LAB — HEMOGLOBIN A1C: Hgb A1c MFr Bld: 6.3 % (ref 4.6–6.5)

## 2024-08-25 DIAGNOSIS — K648 Other hemorrhoids: Secondary | ICD-10-CM | POA: Insufficient documentation

## 2024-08-25 DIAGNOSIS — M25512 Pain in left shoulder: Secondary | ICD-10-CM | POA: Insufficient documentation

## 2024-08-25 DIAGNOSIS — E119 Type 2 diabetes mellitus without complications: Secondary | ICD-10-CM | POA: Insufficient documentation

## 2024-08-25 NOTE — Assessment & Plan Note (Signed)
 Acute, given many falls over the year likely posttraumatic arthritis.  Will evaluate with x-ray today.

## 2024-08-25 NOTE — Assessment & Plan Note (Signed)
 Acute, improved significantly with starting Flonase 2 sprays per nostril daily.

## 2024-08-25 NOTE — Assessment & Plan Note (Signed)
 Due for reevaluation

## 2024-08-25 NOTE — Assessment & Plan Note (Addendum)
 Due for reevaluation after being on new start Zetia  10 mg daily for the last 2 to 3 months.

## 2024-08-27 ENCOUNTER — Other Ambulatory Visit: Payer: Self-pay | Admitting: Family Medicine

## 2024-08-27 NOTE — Telephone Encounter (Signed)
 Last office visit 08/21/2024 for Sneezing.  Last refilled 08/07/2024 for #120 with no refills. Next appt: No future appointments.

## 2025-03-01 ENCOUNTER — Ambulatory Visit
